# Patient Record
Sex: Male | Born: 1960 | Race: White | Hispanic: No | Smoking: Never smoker
Health system: Southern US, Community
[De-identification: ages and names within clinical notes are randomized; demographics above are authoritative.]

## PROBLEM LIST (undated history)

## (undated) DIAGNOSIS — K5790 Diverticulosis of intestine, part unspecified, without perforation or abscess without bleeding: Secondary | ICD-10-CM

## (undated) DIAGNOSIS — I10 Essential (primary) hypertension: Secondary | ICD-10-CM

## (undated) HISTORY — DX: Diverticulosis of intestine, part unspecified, without perforation or abscess without bleeding: K57.90

---

## 2020-08-10 ENCOUNTER — Other Ambulatory Visit: Payer: Self-pay | Admitting: Internal Medicine

## 2020-08-10 DIAGNOSIS — R1032 Left lower quadrant pain: Secondary | ICD-10-CM

## 2020-08-12 ENCOUNTER — Emergency Department (HOSPITAL_COMMUNITY): Payer: Managed Care, Other (non HMO)

## 2020-08-12 ENCOUNTER — Inpatient Hospital Stay: Admission: RE | Admit: 2020-08-12 | Payer: Self-pay | Source: Ambulatory Visit

## 2020-08-12 ENCOUNTER — Inpatient Hospital Stay (HOSPITAL_COMMUNITY)
Admission: EM | Admit: 2020-08-12 | Discharge: 2020-09-01 | DRG: 853 | Disposition: A | Discharge status: Rehabilitation Facility | Payer: Managed Care, Other (non HMO) | Attending: General Surgery | Admitting: General Surgery

## 2020-08-12 ENCOUNTER — Inpatient Hospital Stay (HOSPITAL_COMMUNITY): Payer: Managed Care, Other (non HMO)

## 2020-08-12 ENCOUNTER — Encounter (HOSPITAL_COMMUNITY): Payer: Self-pay

## 2020-08-12 ENCOUNTER — Encounter (HOSPITAL_COMMUNITY): Admission: EM | Disposition: A | Discharge status: Rehabilitation Facility | Payer: Self-pay | Source: Home / Self Care

## 2020-08-12 ENCOUNTER — Emergency Department (HOSPITAL_COMMUNITY): Payer: Managed Care, Other (non HMO) | Admitting: Certified Registered Nurse Anesthetist

## 2020-08-12 ENCOUNTER — Inpatient Hospital Stay: Admit: 2020-08-12 | Payer: Managed Care, Other (non HMO) | Admitting: General Surgery

## 2020-08-12 ENCOUNTER — Other Ambulatory Visit: Payer: Self-pay

## 2020-08-12 DIAGNOSIS — I82611 Acute embolism and thrombosis of superficial veins of right upper extremity: Secondary | ICD-10-CM | POA: Diagnosis not present

## 2020-08-12 DIAGNOSIS — Z7982 Long term (current) use of aspirin: Secondary | ICD-10-CM | POA: Diagnosis not present

## 2020-08-12 DIAGNOSIS — K567 Ileus, unspecified: Secondary | ICD-10-CM | POA: Diagnosis not present

## 2020-08-12 DIAGNOSIS — A419 Sepsis, unspecified organism: Principal | ICD-10-CM | POA: Diagnosis present

## 2020-08-12 DIAGNOSIS — E871 Hypo-osmolality and hyponatremia: Secondary | ICD-10-CM | POA: Diagnosis present

## 2020-08-12 DIAGNOSIS — I82A12 Acute embolism and thrombosis of left axillary vein: Secondary | ICD-10-CM | POA: Diagnosis not present

## 2020-08-12 DIAGNOSIS — Z4659 Encounter for fitting and adjustment of other gastrointestinal appliance and device: Secondary | ICD-10-CM

## 2020-08-12 DIAGNOSIS — K632 Fistula of intestine: Secondary | ICD-10-CM | POA: Diagnosis present

## 2020-08-12 DIAGNOSIS — R6520 Severe sepsis without septic shock: Secondary | ICD-10-CM | POA: Diagnosis not present

## 2020-08-12 DIAGNOSIS — M1711 Unilateral primary osteoarthritis, right knee: Secondary | ICD-10-CM | POA: Diagnosis present

## 2020-08-12 DIAGNOSIS — M726 Necrotizing fasciitis: Secondary | ICD-10-CM | POA: Diagnosis present

## 2020-08-12 DIAGNOSIS — K631 Perforation of intestine (nontraumatic): Secondary | ICD-10-CM | POA: Diagnosis present

## 2020-08-12 DIAGNOSIS — Z20822 Contact with and (suspected) exposure to covid-19: Secondary | ICD-10-CM | POA: Diagnosis present

## 2020-08-12 DIAGNOSIS — G9341 Metabolic encephalopathy: Secondary | ICD-10-CM | POA: Diagnosis present

## 2020-08-12 DIAGNOSIS — K660 Peritoneal adhesions (postprocedural) (postinfection): Secondary | ICD-10-CM | POA: Diagnosis present

## 2020-08-12 DIAGNOSIS — R1030 Lower abdominal pain, unspecified: Secondary | ICD-10-CM | POA: Diagnosis present

## 2020-08-12 DIAGNOSIS — I82461 Acute embolism and thrombosis of right calf muscular vein: Secondary | ICD-10-CM | POA: Diagnosis not present

## 2020-08-12 DIAGNOSIS — K9171 Accidental puncture and laceration of a digestive system organ or structure during a digestive system procedure: Secondary | ICD-10-CM | POA: Diagnosis not present

## 2020-08-12 DIAGNOSIS — M2550 Pain in unspecified joint: Secondary | ICD-10-CM | POA: Diagnosis not present

## 2020-08-12 DIAGNOSIS — Y733 Surgical instruments, materials and gastroenterology and urology devices (including sutures) associated with adverse incidents: Secondary | ICD-10-CM | POA: Diagnosis not present

## 2020-08-12 DIAGNOSIS — D62 Acute posthemorrhagic anemia: Secondary | ICD-10-CM | POA: Diagnosis not present

## 2020-08-12 DIAGNOSIS — K5720 Diverticulitis of large intestine with perforation and abscess without bleeding: Secondary | ICD-10-CM | POA: Diagnosis present

## 2020-08-12 DIAGNOSIS — R6521 Severe sepsis with septic shock: Secondary | ICD-10-CM | POA: Diagnosis present

## 2020-08-12 DIAGNOSIS — R609 Edema, unspecified: Secondary | ICD-10-CM | POA: Diagnosis not present

## 2020-08-12 DIAGNOSIS — M109 Gout, unspecified: Secondary | ICD-10-CM | POA: Diagnosis present

## 2020-08-12 DIAGNOSIS — M79604 Pain in right leg: Secondary | ICD-10-CM | POA: Diagnosis not present

## 2020-08-12 DIAGNOSIS — L89326 Pressure-induced deep tissue damage of left buttock: Secondary | ICD-10-CM | POA: Diagnosis not present

## 2020-08-12 DIAGNOSIS — M19021 Primary osteoarthritis, right elbow: Secondary | ICD-10-CM | POA: Diagnosis present

## 2020-08-12 DIAGNOSIS — M10061 Idiopathic gout, right knee: Secondary | ICD-10-CM | POA: Diagnosis not present

## 2020-08-12 DIAGNOSIS — E440 Moderate protein-calorie malnutrition: Secondary | ICD-10-CM | POA: Diagnosis not present

## 2020-08-12 DIAGNOSIS — L7682 Other postprocedural complications of skin and subcutaneous tissue: Secondary | ICD-10-CM | POA: Diagnosis not present

## 2020-08-12 DIAGNOSIS — M19022 Primary osteoarthritis, left elbow: Secondary | ICD-10-CM | POA: Diagnosis present

## 2020-08-12 DIAGNOSIS — E46 Unspecified protein-calorie malnutrition: Secondary | ICD-10-CM | POA: Diagnosis present

## 2020-08-12 DIAGNOSIS — E119 Type 2 diabetes mellitus without complications: Secondary | ICD-10-CM | POA: Diagnosis not present

## 2020-08-12 DIAGNOSIS — R509 Fever, unspecified: Secondary | ICD-10-CM

## 2020-08-12 DIAGNOSIS — E875 Hyperkalemia: Secondary | ICD-10-CM | POA: Diagnosis not present

## 2020-08-12 DIAGNOSIS — Y658 Other specified misadventures during surgical and medical care: Secondary | ICD-10-CM | POA: Diagnosis not present

## 2020-08-12 DIAGNOSIS — K658 Other peritonitis: Secondary | ICD-10-CM | POA: Diagnosis present

## 2020-08-12 DIAGNOSIS — L8990 Pressure ulcer of unspecified site, unspecified stage: Secondary | ICD-10-CM | POA: Insufficient documentation

## 2020-08-12 DIAGNOSIS — R5381 Other malaise: Secondary | ICD-10-CM | POA: Diagnosis not present

## 2020-08-12 DIAGNOSIS — N179 Acute kidney failure, unspecified: Secondary | ICD-10-CM | POA: Diagnosis present

## 2020-08-12 DIAGNOSIS — R739 Hyperglycemia, unspecified: Secondary | ICD-10-CM | POA: Diagnosis not present

## 2020-08-12 HISTORY — PX: PARTIAL COLECTOMY: SHX5273

## 2020-08-12 LAB — URINALYSIS, ROUTINE W REFLEX MICROSCOPIC
Bilirubin Urine: NEGATIVE
Glucose, UA: NEGATIVE mg/dL
Ketones, ur: NEGATIVE mg/dL
Nitrite: NEGATIVE
Protein, ur: 30 mg/dL — AB
RBC / HPF: >50 RBC/hpf — ABNORMAL HIGH (ref 0–5)
Specific Gravity, Urine: 1.026 (ref 1.005–1.030)
WBC, UA: >50 WBC/hpf — ABNORMAL HIGH (ref 0–5)
pH: 6 (ref 5.0–8.0)

## 2020-08-12 LAB — COMPREHENSIVE METABOLIC PANEL
ALT: 25 U/L (ref 0–44)
ALT: 21 U/L (ref 0–44)
AST: 21 U/L (ref 15–41)
AST: 18 U/L (ref 15–41)
Albumin: 2.4 g/dL — ABNORMAL LOW (ref 3.5–5.0)
Albumin: 1.8 g/dL — ABNORMAL LOW (ref 3.5–5.0)
Alkaline Phosphatase: 118 U/L (ref 38–126)
Alkaline Phosphatase: 101 U/L (ref 38–126)
Anion gap: 14 (ref 5–15)
Anion gap: 11 (ref 5–15)
BUN: 34 mg/dL — ABNORMAL HIGH (ref 6–20)
BUN: 31 mg/dL — ABNORMAL HIGH (ref 6–20)
CO2: 20 mmol/L — ABNORMAL LOW (ref 22–32)
CO2: 22 mmol/L (ref 22–32)
Calcium: 8.5 mg/dL — ABNORMAL LOW (ref 8.9–10.3)
Calcium: 8.1 mg/dL — ABNORMAL LOW (ref 8.9–10.3)
Chloride: 95 mmol/L — ABNORMAL LOW (ref 98–111)
Chloride: 98 mmol/L (ref 98–111)
Creatinine, Ser: 1.45 mg/dL — ABNORMAL HIGH (ref 0.61–1.24)
Creatinine, Ser: 1.24 mg/dL (ref 0.61–1.24)
GFR, Estimated: 55 mL/min — ABNORMAL LOW (ref >60)
GFR, Estimated: >60 mL/min (ref >60)
Glucose, Bld: 138 mg/dL — ABNORMAL HIGH (ref 70–99)
Glucose, Bld: 145 mg/dL — ABNORMAL HIGH (ref 70–99)
Potassium: 4.8 mmol/L (ref 3.5–5.1)
Potassium: 5.3 mmol/L — ABNORMAL HIGH (ref 3.5–5.1)
Sodium: 129 mmol/L — ABNORMAL LOW (ref 135–145)
Sodium: 131 mmol/L — ABNORMAL LOW (ref 135–145)
Total Bilirubin: 1.4 mg/dL — ABNORMAL HIGH (ref 0.3–1.2)
Total Bilirubin: 1.2 mg/dL (ref 0.3–1.2)
Total Protein: 6.1 g/dL — ABNORMAL LOW (ref 6.5–8.1)
Total Protein: 5 g/dL — ABNORMAL LOW (ref 6.5–8.1)

## 2020-08-12 LAB — CBC
HCT: 29.1 % — ABNORMAL LOW (ref 39.0–52.0)
Hemoglobin: 9.6 g/dL — ABNORMAL LOW (ref 13.0–17.0)
MCH: 31.2 pg (ref 26.0–34.0)
MCHC: 33 g/dL (ref 30.0–36.0)
MCV: 94.5 fL (ref 80.0–100.0)
Platelets: 499 10*3/uL — ABNORMAL HIGH (ref 150–400)
RBC: 3.08 MIL/uL — ABNORMAL LOW (ref 4.22–5.81)
RDW: 13.7 % (ref 11.5–15.5)
WBC: 38.8 10*3/uL — ABNORMAL HIGH (ref 4.0–10.5)
nRBC: 0 % (ref 0.0–0.2)

## 2020-08-12 LAB — SURGICAL PCR SCREEN
MRSA, PCR: NEGATIVE
Staphylococcus aureus: NEGATIVE

## 2020-08-12 LAB — RESP PANEL BY RT-PCR (FLU A&B, COVID) ARPGX2
Influenza A by PCR: NEGATIVE
Influenza B by PCR: NEGATIVE
SARS Coronavirus 2 by RT PCR: NEGATIVE

## 2020-08-12 LAB — LIPASE, BLOOD: Lipase: 18 U/L (ref 11–51)

## 2020-08-12 LAB — CBC WITH DIFFERENTIAL/PLATELET
Abs Immature Granulocytes: 0.77 10*3/uL — ABNORMAL HIGH (ref 0.00–0.07)
Basophils Absolute: 0.1 10*3/uL (ref 0.0–0.1)
Basophils Relative: 0 %
Eosinophils Absolute: 0 10*3/uL (ref 0.0–0.5)
Eosinophils Relative: 0 %
HCT: 29.2 % — ABNORMAL LOW (ref 39.0–52.0)
Hemoglobin: 9.8 g/dL — ABNORMAL LOW (ref 13.0–17.0)
Immature Granulocytes: 2 %
Lymphocytes Relative: 1 %
Lymphs Abs: 0.5 10*3/uL — ABNORMAL LOW (ref 0.7–4.0)
MCH: 31 pg (ref 26.0–34.0)
MCHC: 33.6 g/dL (ref 30.0–36.0)
MCV: 92.4 fL (ref 80.0–100.0)
Monocytes Absolute: 1.2 10*3/uL — ABNORMAL HIGH (ref 0.1–1.0)
Monocytes Relative: 3 %
Neutro Abs: 33.5 10*3/uL — ABNORMAL HIGH (ref 1.7–7.7)
Neutrophils Relative %: 94 %
Platelets: 477 10*3/uL — ABNORMAL HIGH (ref 150–400)
RBC: 3.16 MIL/uL — ABNORMAL LOW (ref 4.22–5.81)
RDW: 13.4 % (ref 11.5–15.5)
WBC: 36 10*3/uL — ABNORMAL HIGH (ref 4.0–10.5)
nRBC: 0 % (ref 0.0–0.2)

## 2020-08-12 LAB — POCT I-STAT 7, (LYTES, BLD GAS, ICA,H+H)
Acid-base deficit: 4 mmol/L — ABNORMAL HIGH (ref 0.0–2.0)
Bicarbonate: 22.2 mmol/L (ref 20.0–28.0)
Calcium, Ion: 1.19 mmol/L (ref 1.15–1.40)
HCT: 24 % — ABNORMAL LOW (ref 39.0–52.0)
Hemoglobin: 8.2 g/dL — ABNORMAL LOW (ref 13.0–17.0)
O2 Saturation: 99 %
Potassium: 4.7 mmol/L (ref 3.5–5.1)
Sodium: 128 mmol/L — ABNORMAL LOW (ref 135–145)
TCO2: 23 mmol/L (ref 22–32)
pCO2 arterial: 41.9 mmHg (ref 32.0–48.0)
pH, Arterial: 7.331 — ABNORMAL LOW (ref 7.350–7.450)
pO2, Arterial: 147 mmHg — ABNORMAL HIGH (ref 83.0–108.0)

## 2020-08-12 LAB — ABO/RH: ABO/RH(D): O POS

## 2020-08-12 LAB — GLUCOSE, CAPILLARY
Glucose-Capillary: 136 mg/dL — ABNORMAL HIGH (ref 70–99)
Glucose-Capillary: 172 mg/dL — ABNORMAL HIGH (ref 70–99)

## 2020-08-12 LAB — LACTIC ACID, PLASMA
Lactic Acid, Venous: 2.6 mmol/L (ref 0.5–1.9)
Lactic Acid, Venous: 2.1 mmol/L (ref 0.5–1.9)

## 2020-08-12 LAB — APTT: aPTT: 21 seconds — ABNORMAL LOW (ref 24–36)

## 2020-08-12 LAB — PROTIME-INR
INR: 1.2 (ref 0.8–1.2)
Prothrombin Time: 15.1 seconds (ref 11.4–15.2)

## 2020-08-12 SURGERY — COLECTOMY, PARTIAL
Anesthesia: General

## 2020-08-12 MED ORDER — LACTATED RINGERS IV BOLUS
500.0000 mL | Freq: Once | INTRAVENOUS | Status: AC
  Administered 2020-08-12: 500 mL via INTRAVENOUS

## 2020-08-12 MED ORDER — CLINDAMYCIN PHOSPHATE 600 MG/50ML IV SOLN
600.0000 mg | Freq: Three times a day (TID) | INTRAVENOUS | Status: DC
  Administered 2020-08-12 – 2020-08-17 (×12): 600 mg via INTRAVENOUS
  Filled 2020-08-12 (×13): qty 50

## 2020-08-12 MED ORDER — ENOXAPARIN SODIUM 40 MG/0.4ML IJ SOSY: 40.0000 mg | PREFILLED_SYRINGE | INTRAMUSCULAR | Status: DC

## 2020-08-12 MED ORDER — OXYCODONE HCL 5 MG/5ML PO SOLN: 5.0000 mg | Freq: Once | ORAL | Status: DC | PRN

## 2020-08-12 MED ORDER — VANCOMYCIN HCL 1250 MG/250ML IV SOLN
1250.0000 mg | INTRAVENOUS | Status: DC
  Administered 2020-08-13: 1250 mg via INTRAVENOUS
  Filled 2020-08-12: qty 250

## 2020-08-12 MED ORDER — NOREPINEPHRINE 4 MG/250ML-% IV SOLN
2.0000 ug/min | INTRAVENOUS | Status: DC
  Administered 2020-08-12: 2 ug/min via INTRAVENOUS
  Filled 2020-08-12: qty 250

## 2020-08-12 MED ORDER — ACETAMINOPHEN 10 MG/ML IV SOLN
1000.0000 mg | Freq: Four times a day (QID) | INTRAVENOUS | Status: AC
  Administered 2020-08-12 – 2020-08-13 (×4): 1000 mg via INTRAVENOUS
  Filled 2020-08-12 (×4): qty 100

## 2020-08-12 MED ORDER — ORAL CARE MOUTH RINSE
15.0000 mL | Freq: Two times a day (BID) | OROMUCOSAL | Status: DC
  Administered 2020-08-12 – 2020-09-01 (×32): 15 mL via OROMUCOSAL

## 2020-08-12 MED ORDER — LIDOCAINE 2% (20 MG/ML) 5 ML SYRINGE
INTRAMUSCULAR | Status: AC
  Filled 2020-08-12: qty 5

## 2020-08-12 MED ORDER — METRONIDAZOLE 500 MG/100ML IV SOLN
500.0000 mg | Freq: Once | INTRAVENOUS | Status: AC
  Administered 2020-08-12: 500 mg via INTRAVENOUS
  Filled 2020-08-12: qty 100

## 2020-08-12 MED ORDER — MUPIROCIN 2 % EX OINT
1.0000 "application " | TOPICAL_OINTMENT | Freq: Two times a day (BID) | CUTANEOUS | Status: DC
  Filled 2020-08-12: qty 22

## 2020-08-12 MED ORDER — PIPERACILLIN-TAZOBACTAM 3.375 G IVPB
3.3750 g | Freq: Three times a day (TID) | INTRAVENOUS | Status: AC
  Administered 2020-08-12 – 2020-08-26 (×41): 3.375 g via INTRAVENOUS
  Filled 2020-08-12 (×39): qty 50

## 2020-08-12 MED ORDER — LACTATED RINGERS IV SOLN: INTRAVENOUS | Status: DC

## 2020-08-12 MED ORDER — ONDANSETRON HCL 4 MG/2ML IJ SOLN: 4.0000 mg | Freq: Four times a day (QID) | INTRAMUSCULAR | Status: DC | PRN

## 2020-08-12 MED ORDER — SODIUM CHLORIDE 0.9 % IV BOLUS
1000.0000 mL | Freq: Once | INTRAVENOUS | Status: AC
  Administered 2020-08-12: 1000 mL via INTRAVENOUS

## 2020-08-12 MED ORDER — ONDANSETRON 4 MG PO TBDP
4.0000 mg | ORAL_TABLET | Freq: Four times a day (QID) | ORAL | Status: DC | PRN
Start: 2020-08-12 — End: 2020-09-01

## 2020-08-12 MED ORDER — SODIUM CHLORIDE 0.9 % IV SOLN
250.0000 mL | INTRAVENOUS | Status: DC
  Administered 2020-08-12: 250 mL via INTRAVENOUS

## 2020-08-12 MED ORDER — MIDAZOLAM HCL 2 MG/2ML IJ SOLN
INTRAMUSCULAR | Status: AC
  Filled 2020-08-12: qty 2

## 2020-08-12 MED ORDER — PHENYLEPHRINE 40 MCG/ML (10ML) SYRINGE FOR IV PUSH (FOR BLOOD PRESSURE SUPPORT)
PREFILLED_SYRINGE | INTRAVENOUS | Status: AC
  Filled 2020-08-12: qty 40

## 2020-08-12 MED ORDER — MIDAZOLAM HCL 2 MG/2ML IJ SOLN
INTRAMUSCULAR | Status: DC | PRN
  Administered 2020-08-12: 2 mg via INTRAVENOUS

## 2020-08-12 MED ORDER — NALOXONE HCL 0.4 MG/ML IJ SOLN
0.4000 mg | INTRAMUSCULAR | Status: DC | PRN
Start: 2020-08-12 — End: 2020-09-01

## 2020-08-12 MED ORDER — MEPERIDINE HCL 50 MG/ML IJ SOLN: 6.2500 mg | INTRAMUSCULAR | Status: DC | PRN

## 2020-08-12 MED ORDER — ROCURONIUM BROMIDE 10 MG/ML (PF) SYRINGE
PREFILLED_SYRINGE | INTRAVENOUS | Status: AC
  Filled 2020-08-12: qty 10

## 2020-08-12 MED ORDER — PHENYLEPHRINE HCL-NACL 10-0.9 MG/250ML-% IV SOLN
INTRAVENOUS | Status: DC | PRN
  Administered 2020-08-12: 20 ug/min via INTRAVENOUS

## 2020-08-12 MED ORDER — DIPHENHYDRAMINE HCL 50 MG/ML IJ SOLN: 12.5000 mg | Freq: Four times a day (QID) | INTRAMUSCULAR | Status: DC | PRN

## 2020-08-12 MED ORDER — FENTANYL CITRATE (PF) 100 MCG/2ML IJ SOLN
25.0000 ug | INTRAMUSCULAR | Status: DC | PRN
  Administered 2020-08-13 – 2020-08-20 (×7): 25 ug via INTRAVENOUS
  Filled 2020-08-12 (×7): qty 2

## 2020-08-12 MED ORDER — ONDANSETRON HCL 4 MG/2ML IJ SOLN
4.0000 mg | Freq: Four times a day (QID) | INTRAMUSCULAR | Status: DC | PRN
  Filled 2020-08-12: qty 2

## 2020-08-12 MED ORDER — HYDROMORPHONE 1 MG/ML IV SOLN
INTRAVENOUS | Status: DC
Start: 2020-08-12 — End: 2020-08-20
  Administered 2020-08-12 – 2020-08-14 (×2): 30 mg via INTRAVENOUS
  Administered 2020-08-14: 1.5 mg via INTRAVENOUS
  Administered 2020-08-15: 1.8 mg via INTRAVENOUS
  Administered 2020-08-15: 2.4 mg via INTRAVENOUS
  Administered 2020-08-16: 30 mg via INTRAVENOUS
  Administered 2020-08-16: 0.9 mL via INTRAVENOUS
  Administered 2020-08-17: 0.6 mL via INTRAVENOUS
  Administered 2020-08-17 – 2020-08-18 (×3): 30 mg via INTRAVENOUS
  Filled 2020-08-12 (×4): qty 30

## 2020-08-12 MED ORDER — VANCOMYCIN HCL IN DEXTROSE 1-5 GM/200ML-% IV SOLN: 1000.0000 mg | Freq: Once | INTRAVENOUS | Status: DC

## 2020-08-12 MED ORDER — IOHEXOL 350 MG/ML SOLN
100.0000 mL | Freq: Once | INTRAVENOUS | Status: AC | PRN
  Administered 2020-08-12: 80 mL via INTRAVENOUS

## 2020-08-12 MED ORDER — CHLORHEXIDINE GLUCONATE CLOTH 2 % EX PADS
6.0000 | MEDICATED_PAD | Freq: Every day | CUTANEOUS | Status: DC
  Administered 2020-08-12 – 2020-09-01 (×21): 6 via TOPICAL

## 2020-08-12 MED ORDER — SODIUM CHLORIDE 0.9 % IV SOLN: INTRAVENOUS | Status: DC

## 2020-08-12 MED ORDER — DIPHENHYDRAMINE HCL 12.5 MG/5ML PO ELIX: 12.5000 mg | ORAL_SOLUTION | Freq: Four times a day (QID) | ORAL | Status: DC | PRN

## 2020-08-12 MED ORDER — MIDAZOLAM HCL 2 MG/2ML IJ SOLN: 0.5000 mg | Freq: Once | INTRAMUSCULAR | Status: DC | PRN

## 2020-08-12 MED ORDER — OXYCODONE HCL 5 MG PO TABS: 5.0000 mg | ORAL_TABLET | Freq: Once | ORAL | Status: DC | PRN

## 2020-08-12 MED ORDER — ROCURONIUM BROMIDE 10 MG/ML (PF) SYRINGE
PREFILLED_SYRINGE | INTRAVENOUS | Status: DC | PRN
  Administered 2020-08-12: 60 mg via INTRAVENOUS
  Administered 2020-08-12: 40 mg via INTRAVENOUS

## 2020-08-12 MED ORDER — VANCOMYCIN HCL 1500 MG/300ML IV SOLN
1500.0000 mg | Freq: Once | INTRAVENOUS | Status: AC
  Administered 2020-08-12: 1500 mg via INTRAVENOUS
  Filled 2020-08-12: qty 300

## 2020-08-12 MED ORDER — PROPOFOL 10 MG/ML IV BOLUS
INTRAVENOUS | Status: AC
  Filled 2020-08-12: qty 20

## 2020-08-12 MED ORDER — LACTATED RINGERS IV BOLUS (SEPSIS)
1000.0000 mL | Freq: Once | INTRAVENOUS | Status: AC
  Administered 2020-08-12: 1000 mL via INTRAVENOUS

## 2020-08-12 MED ORDER — SODIUM CHLORIDE 0.9% FLUSH: 9.0000 mL | INTRAVENOUS | Status: DC | PRN

## 2020-08-12 MED ORDER — ALBUMIN HUMAN 5 % IV SOLN
25.0000 g | Freq: Once | INTRAVENOUS | Status: AC
  Administered 2020-08-12: 25 g via INTRAVENOUS
  Filled 2020-08-12: qty 500

## 2020-08-12 MED ORDER — PHENYLEPHRINE HCL (PRESSORS) 10 MG/ML IV SOLN
INTRAVENOUS | Status: AC
  Filled 2020-08-12: qty 1

## 2020-08-12 MED ORDER — ORAL CARE MOUTH RINSE: 15.0000 mL | Freq: Once | OROMUCOSAL | Status: AC

## 2020-08-12 MED ORDER — METHOCARBAMOL 1000 MG/10ML IJ SOLN
1000.0000 mg | Freq: Three times a day (TID) | INTRAVENOUS | Status: DC
  Administered 2020-08-13 – 2020-08-20 (×21): 1000 mg via INTRAVENOUS
  Filled 2020-08-12 (×2): qty 1000
  Filled 2020-08-12: qty 10
  Filled 2020-08-12 (×4): qty 1000
  Filled 2020-08-12: qty 10
  Filled 2020-08-12 (×9): qty 1000
  Filled 2020-08-12: qty 10
  Filled 2020-08-12: qty 1000
  Filled 2020-08-12: qty 10
  Filled 2020-08-12: qty 1000
  Filled 2020-08-12: qty 10
  Filled 2020-08-12 (×2): qty 1000

## 2020-08-12 MED ORDER — PROPOFOL 10 MG/ML IV BOLUS
INTRAVENOUS | Status: DC | PRN
  Administered 2020-08-12: 150 mg via INTRAVENOUS

## 2020-08-12 MED ORDER — FENTANYL CITRATE (PF) 250 MCG/5ML IJ SOLN
INTRAMUSCULAR | Status: DC | PRN
  Administered 2020-08-12 (×3): 50 ug via INTRAVENOUS
  Administered 2020-08-12: 100 ug via INTRAVENOUS

## 2020-08-12 MED ORDER — FENTANYL CITRATE (PF) 250 MCG/5ML IJ SOLN
INTRAMUSCULAR | Status: AC
  Filled 2020-08-12: qty 5

## 2020-08-12 MED ORDER — HYDROMORPHONE HCL 1 MG/ML IJ SOLN
INTRAMUSCULAR | Status: AC
  Filled 2020-08-12: qty 1

## 2020-08-12 MED ORDER — LACTATED RINGERS IV BOLUS (SEPSIS)
250.0000 mL | Freq: Once | INTRAVENOUS | Status: AC
  Administered 2020-08-12: 250 mL via INTRAVENOUS

## 2020-08-12 MED ORDER — DEXAMETHASONE SODIUM PHOSPHATE 10 MG/ML IJ SOLN
INTRAMUSCULAR | Status: DC | PRN
  Administered 2020-08-12: 5 mg via INTRAVENOUS

## 2020-08-12 MED ORDER — SUGAMMADEX SODIUM 200 MG/2ML IV SOLN
INTRAVENOUS | Status: DC | PRN
  Administered 2020-08-12: 200 mg via INTRAVENOUS

## 2020-08-12 MED ORDER — METOPROLOL TARTRATE 5 MG/5ML IV SOLN: 5.0000 mg | Freq: Four times a day (QID) | INTRAVENOUS | Status: DC | PRN

## 2020-08-12 MED ORDER — SODIUM CHLORIDE (PF) 0.9 % IJ SOLN
INTRAMUSCULAR | Status: AC
  Filled 2020-08-12: qty 50

## 2020-08-12 MED ORDER — PIPERACILLIN-TAZOBACTAM 3.375 G IVPB: 3.3750 g | Freq: Three times a day (TID) | INTRAVENOUS | Status: DC

## 2020-08-12 MED ORDER — CHLORHEXIDINE GLUCONATE 0.12 % MT SOLN
15.0000 mL | Freq: Once | OROMUCOSAL | Status: AC
  Administered 2020-08-12: 15 mL via OROMUCOSAL

## 2020-08-12 MED ORDER — PHENYLEPHRINE 40 MCG/ML (10ML) SYRINGE FOR IV PUSH (FOR BLOOD PRESSURE SUPPORT)
PREFILLED_SYRINGE | INTRAVENOUS | Status: DC | PRN
  Administered 2020-08-12: 40 ug via INTRAVENOUS
  Administered 2020-08-12: 80 ug via INTRAVENOUS
  Administered 2020-08-12 (×2): 40 ug via INTRAVENOUS

## 2020-08-12 MED ORDER — HYDROMORPHONE HCL 1 MG/ML IJ SOLN: 0.2500 mg | INTRAMUSCULAR | Status: DC | PRN

## 2020-08-12 MED ORDER — SODIUM CHLORIDE 0.9 % IV SOLN
2.0000 g | Freq: Once | INTRAVENOUS | Status: AC
  Administered 2020-08-12: 2 g via INTRAVENOUS
  Filled 2020-08-12: qty 2

## 2020-08-12 MED ORDER — ONDANSETRON HCL 4 MG/2ML IJ SOLN
INTRAMUSCULAR | Status: DC | PRN
  Administered 2020-08-12: 4 mg via INTRAVENOUS

## 2020-08-12 MED ORDER — PROMETHAZINE HCL 25 MG/ML IJ SOLN: 6.2500 mg | INTRAMUSCULAR | Status: DC | PRN

## 2020-08-12 MED ORDER — 0.9 % SODIUM CHLORIDE (POUR BTL) OPTIME
TOPICAL | Status: DC | PRN
  Administered 2020-08-12 (×3): 1000 mL

## 2020-08-12 MED ORDER — LIDOCAINE HCL (CARDIAC) PF 100 MG/5ML IV SOSY
PREFILLED_SYRINGE | INTRAVENOUS | Status: DC | PRN
  Administered 2020-08-12: 100 mg via INTRAVENOUS

## 2020-08-12 SURGICAL SUPPLY — 50 items
BAG COUNTER SPONGE SURGICOUNT (BAG) IMPLANT
BAG SPNG CNTER NS LX DISP (BAG)
BARRIER SKIN 2 1/4 (WOUND CARE) ×2 IMPLANT
BNDG GAUZE ELAST 4 BULKY (GAUZE/BANDAGES/DRESSINGS) ×2 IMPLANT
BRR SKN FLT 1.75X2.25 2 PC (WOUND CARE) ×1
DRSG OPSITE POSTOP 4X10 (GAUZE/BANDAGES/DRESSINGS) IMPLANT
DRSG OPSITE POSTOP 4X6 (GAUZE/BANDAGES/DRESSINGS) IMPLANT
DRSG OPSITE POSTOP 4X8 (GAUZE/BANDAGES/DRESSINGS) IMPLANT
DRSG PAD ABDOMINAL 8X10 ST (GAUZE/BANDAGES/DRESSINGS) ×2 IMPLANT
ELECT REM PT RETURN 15FT ADLT (MISCELLANEOUS) ×2 IMPLANT
GAUZE SPONGE 4X4 12PLY STRL (GAUZE/BANDAGES/DRESSINGS) ×2 IMPLANT
GLOVE SURG POLYISO LF SZ7 (GLOVE) ×4 IMPLANT
GLOVE SURG UNDER POLY LF SZ7 (GLOVE) ×4 IMPLANT
GOWN STRL REUS W/TWL LRG LVL3 (GOWN DISPOSABLE) ×4 IMPLANT
GOWN STRL REUS W/TWL XL LVL3 (GOWN DISPOSABLE) IMPLANT
HEMOSTAT SNOW SURGICEL 2X4 (HEMOSTASIS) ×2 IMPLANT
HOLDER FOLEY CATH W/STRAP (MISCELLANEOUS) IMPLANT
KIT TURNOVER KIT A (KITS) ×2 IMPLANT
LIGASURE IMPACT 36 18CM CVD LR (INSTRUMENTS) ×2 IMPLANT
MANIFOLD NEPTUNE II (INSTRUMENTS) ×2 IMPLANT
PACK COLON (CUSTOM PROCEDURE TRAY) ×2 IMPLANT
PENCIL SMOKE EVACUATOR (MISCELLANEOUS) IMPLANT
POUCH OSTOMY 2 PC DRNBL 2.25 (WOUND CARE) ×1 IMPLANT
POUCH OSTOMY DRNBL 2 1/4 (WOUND CARE) ×2
RELOAD PROXIMATE 75MM BLUE (ENDOMECHANICALS) IMPLANT
SPONGE T-LAP 18X18 ~~LOC~~+RFID (SPONGE) ×6 IMPLANT
STAPLER CUT RELOAD BLUE (STAPLE) ×4 IMPLANT
STAPLER GUN LINEAR PROX 60 (STAPLE) IMPLANT
STAPLER PROXIMATE 75MM BLUE (STAPLE) IMPLANT
SUT NOVA NAB DX-16 0-1 5-0 T12 (SUTURE) IMPLANT
SUT NOVA NAB GS-21 0 18 T12 DT (SUTURE) ×4 IMPLANT
SUT PDS AB 0 CT 36 (SUTURE) ×2 IMPLANT
SUT PDS AB 0 CTX 36 PDP370T (SUTURE) ×4 IMPLANT
SUT PROLENE 2 0 BLUE (SUTURE) IMPLANT
SUT PROLENE 2 0 SH DA (SUTURE) ×2 IMPLANT
SUT SILK 2 0 (SUTURE) ×2
SUT SILK 2 0 SH CR/8 (SUTURE) ×2 IMPLANT
SUT SILK 2-0 18XBRD TIE 12 (SUTURE) ×1 IMPLANT
SUT SILK 3 0 (SUTURE) ×2
SUT SILK 3 0 SH CR/8 (SUTURE) ×2 IMPLANT
SUT SILK 3-0 18XBRD TIE 12 (SUTURE) ×1 IMPLANT
SUT VIC AB 2-0 SH 18 (SUTURE) IMPLANT
SUT VIC AB 2-0 SH 27 (SUTURE) ×4
SUT VIC AB 2-0 SH 27X BRD (SUTURE) ×2 IMPLANT
SUT VIC AB 3-0 SH 8-18 (SUTURE) ×2 IMPLANT
SUT VIC AB 4-0 PS2 18 (SUTURE) ×2 IMPLANT
TAPE CLOTH SURG 4X10 WHT LF (GAUZE/BANDAGES/DRESSINGS) ×2 IMPLANT
TOWEL OR 17X26 10 PK STRL BLUE (TOWEL DISPOSABLE) IMPLANT
TOWEL OR NON WOVEN STRL DISP B (DISPOSABLE) ×4 IMPLANT
TUBING CONNECTING 10 (TUBING) ×4 IMPLANT

## 2020-08-12 NOTE — ED Notes (Signed)
CRITICAL LAB VALUE: LACTIC ACID 2.6 REPORTED TO DR Rogene Houston AND ATTENDING PA

## 2020-08-12 NOTE — H&P (Signed)
Admission Note  Roger Brennan [birth year]  [id number].    Requesting MD: Charmaine Downs PA-C Chief Complaint/Reason for Consult: colon perforation   HPI:  Patient is an otherwise healthy 60 year old male who presented to [facility] today at the direction of PCP with LLQ abdominal pain and induration for the last 2-3 weeks. This started as a small area, about the size of a silver dollar, and has progressively expanded and worsened. He reports pain after physical activity. Reports abdominal wall was not erythematous until today. He does report decreased appetite and intermittent fevers over the last several weeks. Denies chest pain, SOB, urinary symptoms, nausea, vomiting. He is having bowel movements still and last BM was yesterday. He started having some issues with constipation in early June and he made diet adjustments with fiber supplementation for this. His PCP was seeing him for this and was planning a CT scan but the earliest he could get scheduled was 7/26 and PCP felt he needed to be evaluated sooner. His wife is at the bedside and also reports ~20 lbs weight loss over the last 6 weeks. Past abdominal surgery includes bilateral inguinal hernia repairs at age 85. He reports NKDA and takes no blood thinning medications. He denies tobacco use, heavy alcohol use or illicit drug use. 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 He has never had a colonoscopy.   ROS: Review of Systems  Constitutional:  Positive for fever and weight loss. Negative for chills.  Respiratory:  Negative for shortness of breath and wheezing.   Cardiovascular:  Negative for chest pain and palpitations.  Gastrointestinal:  Positive for abdominal pain and constipation. Negative for blood in stool, diarrhea, melena, nausea and vomiting.  Genitourinary:  Negative for dysuria, frequency and urgency.  All other systems reviewed and are negative.  History  reviewed. No pertinent family history.  History reviewed. No pertinent past medical history.  Social History:  has no history on file for tobacco use, alcohol use, and drug use.  Allergies: No Known Allergies  (Not in a hospital admission)   Blood pressure 120/79, pulse (!) 115, temperature 98.9 F (37.2 C), temperature source Oral, resp. rate 15, height 5\' 10"  (1.778 m), weight 73.9 kg, SpO2 100 %. Physical Exam:  General: pleasant, WD, WN male who is laying in bed in NAD HEENT: head is normocephalic, atraumatic.  Sclera are noninjected.  PERRL.  Ears and nose without any masses or lesions.  Mouth is pink and moist Heart: sinus tachycardia in the 110s.  Normal s1,s2. No obvious murmurs, gallops, or rubs noted.  Palpable radial and pedal pulses bilaterally Lungs: CTAB, no wheezes, rhonchi, or rales noted.  Respiratory effort nonlabored Abd: firm, abdominal wall erythematous, mild-moderate generalized ttp MS: all 4 extremities are symmetrical with no cyanosis, clubbing, or edema. Skin: warm and dry with no masses, lesions, or rashes Neuro: Cranial nerves 2-12 grossly intact, sensation is normal throughout Psych: A&Ox3 with an appropriate affect.   Results for orders placed or performed during the hospital encounter of 08/12/20 (from the past 48 hour(s))  CBC with Differential     Status: Abnormal   Collection Time: 08/12/20 10:10 AM  Result Value Ref Range   WBC 36.0 (H) 4.0 - 10.5 K/uL   RBC 3.16 (L) 4.22 - 5.81 MIL/uL   Hemoglobin 9.8 (L) 13.0 - 17.0 g/dL   HCT 29.2 (L) 39.0 - 52.0 %  MCV 92.4 80.0 - 100.0 fL   MCH 31.0 26.0 - 34.0 pg   MCHC 33.6 30.0 - 36.0 g/dL   RDW 13.4 11.5 - 15.5 %   Platelets 477 (H) 150 - 400 K/uL   nRBC 0.0 0.0 - 0.2 %   Neutrophils Relative % 94 %   Neutro Abs 33.5 (H) 1.7 - 7.7 K/uL   Lymphocytes Relative 1 %   Lymphs Abs 0.5 (L) 0.7 - 4.0 K/uL   Monocytes Relative 3 %   Monocytes Absolute 1.2 (H) 0.1 - 1.0 K/uL   Eosinophils Relative 0 %    Eosinophils Absolute 0.0 0.0 - 0.5 K/uL   Basophils Relative 0 %   Basophils Absolute 0.1 0.0 - 0.1 K/uL   Immature Granulocytes 2 %   Abs Immature Granulocytes 0.77 (H) 0.00 - 0.07 K/uL    Comment: Performed at [facility], [place] [address], [place], [place] [address]  Comprehensive metabolic panel     Status: Abnormal   Collection Time: 08/12/20 10:10 AM  Result Value Ref Range   Sodium 129 (L) 135 - 145 mmol/L   Potassium 4.8 3.5 - 5.1 mmol/L   Chloride 95 (L) 98 - 111 mmol/L   CO2 20 (L) 22 - 32 mmol/L   Glucose, Bld 138 (H) 70 - 99 mg/dL    Comment: Glucose reference range applies only to samples taken after fasting for at least 8 hours.   BUN 34 (H) 6 - 20 mg/dL   Creatinine, Ser 1.45 (H) 0.61 - 1.24 mg/dL   Calcium 8.5 (L) 8.9 - 10.3 mg/dL   Total Protein 6.1 (L) 6.5 - 8.1 g/dL   Albumin 2.4 (L) 3.5 - 5.0 g/dL   AST 21 15 - 41 U/L   ALT 25 0 - 44 U/L   Alkaline Phosphatase 118 38 - 126 U/L   Total Bilirubin 1.4 (H) 0.3 - 1.2 mg/dL   GFR, Estimated 55 (L) >60 mL/min    Comment: (NOTE) Calculated using the CKD-EPI Creatinine Equation (2021)    Anion gap 14 5 - 15    Comment: Performed at [facility], [place] [address] [place], [place] [address]  Lipase, blood     Status: None   Collection Time: 08/12/20 10:10 AM  Result Value Ref Range   Lipase 18 11 - 51 U/L    Comment: Performed at [facility], [place] [address] [place], [place] [address]  Lactic acid, plasma     Status: Abnormal   Collection Time: 08/12/20 10:10 AM  Result Value Ref Range   Lactic Acid, Venous 2.6 (HH) 0.5 - 1.9 mmol/L    Comment: CRITICAL RESULT CALLED TO, READ BACK BY AND VERIFIED WITH: Burna Forts RN @ 1116 ON 08/12/2020 BY Rolena Infante, E  Performed at [facility], [place] [address] [place] [address], [place], [place] [address]   Protime-INR     Status: None   Collection Time: 08/12/20 10:10 AM  Result Value Ref Range   Prothrombin Time 15.1 11.4 -  15.2 seconds   INR 1.2 0.8 - 1.2    Comment: (NOTE) INR goal varies based on device and disease states. Performed at [facility], [place] [address], [place], [place] [address]   APTT     Status: Abnormal   Collection Time: 08/12/20 10:10 AM  Result Value Ref Range   aPTT 21 (L) 24 - 36 seconds    Comment: Performed at [facility], [place]  [address] [place], [place] [address]  Resp Panel by RT-PCR (Flu A&B, Covid) Nasopharyngeal Swab     Status: None   Collection Time: 08/12/20 10:55 AM   Specimen: Nasopharyngeal Swab; Nasopharyngeal(NP) swabs in vial transport medium  Result Value Ref Range   SARS Coronavirus 2 by RT PCR NEGATIVE NEGATIVE    Comment: (NOTE) SARS-CoV-2 target nucleic acids are NOT DETECTED.  The SARS-CoV-2 RNA is generally detectable in upper respiratory specimens during the acute phase of infection. The lowest concentration of SARS-CoV-2 viral copies this assay can detect is 138 copies/mL. A negative result does not preclude SARS-Cov-2 infection and should not be used as the sole basis for treatment or other patient management decisions. A negative result may occur with  improper specimen collection/handling, submission of specimen other than nasopharyngeal swab, presence of viral mutation(s) within the areas targeted by this assay, and inadequate number of viral copies(<138 copies/mL). A negative result must be combined with clinical observations, patient history, and epidemiological information. The expected result is Negative.  Fact Sheet for Patients:  EntrepreneurPulse.com.au  Fact Sheet for Healthcare Providers:  IncredibleEmployment.be  This test is no t yet approved or cleared by the [place] FDA and  has been authorized for detection and/or diagnosis of SARS-CoV-2 by FDA under an Emergency Use Authorization (EUA). This EUA will remain  in effect (meaning this test can be  used) for the duration of the COVID-19 declaration under Section 564(b)(1) of the Act, 21 U.S.C.section 360bbb-3(b)(1), unless the authorization is terminated  or revoked sooner.       Influenza A by PCR NEGATIVE NEGATIVE   Influenza B by PCR NEGATIVE NEGATIVE    Comment: (NOTE) The Xpert Xpress SARS-CoV-2/FLU/RSV plus assay is intended as an aid in the diagnosis of influenza from Nasopharyngeal swab specimens and should not be used as a sole basis for treatment. Nasal washings and aspirates are unacceptable for Xpert Xpress SARS-CoV-2/FLU/RSV testing.  Fact Sheet for Patients: EntrepreneurPulse.com.au  Fact Sheet for Healthcare Providers: IncredibleEmployment.be  This test is not yet approved or cleared by the [place] FDA and has been authorized for detection and/or diagnosis of SARS-CoV-2 by FDA under an Emergency Use Authorization (EUA). This EUA will remain in effect (meaning this test can be used) for the duration of the COVID-19 declaration under Section 564(b)(1) of the Act, 21 U.S.C. section 360bbb-3(b)(1), unless the authorization is terminated or revoked.  Performed at [facility], [place] [address], [place], [place] [address]   Urinalysis, Routine w reflex microscopic Urine, Clean Catch     Status: Abnormal   Collection Time: 08/12/20 12:42 PM  Result Value Ref Range   Color, Urine YELLOW YELLOW   APPearance TURBID (A) CLEAR   Specific Gravity, Urine 1.026 1.005 - 1.030   pH 6.0 5.0 - 8.0   Glucose, UA NEGATIVE NEGATIVE mg/dL   Hgb urine dipstick MODERATE (A) NEGATIVE   Bilirubin Urine NEGATIVE NEGATIVE   Ketones, ur NEGATIVE NEGATIVE mg/dL   Protein, ur 30 (A) NEGATIVE mg/dL   Nitrite NEGATIVE NEGATIVE   Leukocytes,Ua LARGE (A) NEGATIVE   RBC / HPF >50 (H) 0 - 5 RBC/hpf   WBC, UA >50 (H) 0 - 5 WBC/hpf   Bacteria, UA MANY (A) NONE SEEN   WBC Clumps PRESENT    Mucus PRESENT     Comment: Performed  at [facility], [place] [address], [place], [place] [address]   CT ABDOMEN PELVIS W CONTRAST  Result Date: 08/12/2020 CLINICAL DATA:  Abdominal  pain, fever EXAM: CT ABDOMEN AND PELVIS WITH CONTRAST TECHNIQUE: Multidetector CT imaging of the abdomen and pelvis was performed using the standard protocol following bolus administration of intravenous contrast. CONTRAST:  88mL OMNIPAQUE IOHEXOL 350 MG/ML SOLN COMPARISON:  None. FINDINGS: Lower chest: Lung bases are clear. No effusions. Heart is normal size. Hepatobiliary: No focal hepatic abnormality. Gallbladder unremarkable. Pancreas: No focal abnormality or ductal dilatation. Spleen: No focal abnormality.  Normal size. Adrenals/Urinary Tract: No adrenal abnormality. No focal renal abnormality. No stones or hydronephrosis. Urinary bladder is unremarkable. Stomach/Bowel: There is sigmoid diverticulosis. Wall thickening noted within the sigmoid colon with surrounding inflammation and extraluminal gas most compatible with diverticulitis although perforated neoplasm cannot be completely excluded. Stomach and small bowel decompressed. Vascular/Lymphatic: No evidence of aneurysm or adenopathy. Reproductive: No visible focal abnormality. Other: Large complex gas and fluid collection noted throughout the left anterior abdominal wall and left lower abdominal wall musculature. This is concerning for possible fistulous communication to the intra-abdominal cavity related to inflammatory process in the left lower quadrant. Fluid and gas collection in the left lower abdominal wall measures 11.5 x 5.1 cm on image 71 of series 2. On coronal imaging, this extends over approximately 18 cm craniocaudal length. Musculoskeletal: No acute bony abnormality. IMPRESSION: Inflammatory process in the left lower quadrant consisting of sigmoid diverticulosis with wall thickening, surrounding inflammation and extraluminal gas as well as large fluid and gas collection extending  into the left abdominal wall musculature and subcutaneous soft tissues in the left lower abdomen and pelvis. Findings concerning for perforated diverticulitis with associated fistulous connection to the abdominal wall. Perforated neoplasm cannot be completely excluded. These results were called by telephone at the time of interpretation on 08/12/2020 at 11:54 am to provider [facility] , who verbally acknowledged these results. Electronically Signed   By: Rolm Baptise M.D.   On: 08/12/2020 11:58   DG Chest Port 1 View  Result Date: 08/12/2020 CLINICAL DATA:  Questionable sepsis.  Left abdominal pain. EXAM: PORTABLE CHEST 1 VIEW COMPARISON:  None. FINDINGS: Heart and mediastinal contours are within normal limits. No focal opacities or effusions. No acute bony abnormality. IMPRESSION: No active disease. Electronically Signed   By: Rolm Baptise M.D.   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

## 2020-08-12 NOTE — ED Notes (Signed)
Patient off the floor to short stay

## 2020-08-12 NOTE — Op Note (Signed)
Preoperative diagnosis: abdominal wall abscess, diverticulitis  Postoperative diagnosis: necrotizing soft tissue infection of the abdominal wall, perforated diverticulitis with colocutaneous fistula  Procedure:  1. drainage and debridement of left lower quadrant abdominal wall 15 cm by 20 cm,  2. partial colectomy with end colostomy 3. Splenic mobilization  Surgeon: Gurney Maxin, M.D.  Asst: Barkley Boards, PA-C  Anesthesia: general  Indications for procedure: Roger Brennan is a 60 y.o. year old male with symptoms of abdominal pain and swelling over the left lower quadrant. Imaging showed perforated colon with developing abscess in the abdominal wall. He was hypotensive, tachycardic, febrile, with leukocytosis consistent with septic shock.  Description of procedure: The patient was brought into the operative suite. Anesthesia was administered with General endotracheal anesthesia. WHO checklist was applied. The patient was then placed in supine position. The area was prepped and draped in the usual sterile fashion.  Next, a left transverse incision was made and a large amount gray purulent fluid was drained. The incision was enlarged and there was necrotic fat and some necrotic fascia in the area.  The area tracked across midline approximately 20 cm in the transverse direction approximately 15 cm in the vertical direction.  , Midline incision was made cautery was used dissect down through subtenons tissue the inferior portion did involve necrotic looking fascia.  The abdomen was entered.  --- Upon entering the abdomen (organ space), I encountered feculent peritonitis. ---  There are multiple adhesions of the small intestine to the sigmoid colon sigmoid colon is densely adhered to the abdominal wall anteriorly and laterally.  Distal rectum was dilated.  Sharp dissection was used to free the small intestine away from the sigmoid colon.  Dissection was used to free the colon away from the  lateral and anterior abdominal wall.  A blue load contour stapler was used to divide healthy portion of rectosigmoid junction.  A LigaSure was used to divide mesentery going proximally.  White line of Toldt was incised moving towards the spleen.  Spleen was very high.  Once a healthy portion of colon was seen was divided with a blue load contour stapler.  Due to this perforation being more of a sigmoid left colon then distal sigmoid additional mobilization was made of the left colon.  The entire splenic flexure was taken down with blunt dissection and cautery.  Next a circular incision was made in the left upper quadrant cautery was used to dissect down through subcutaneous tissues and a cruciate incision was made in the fascia and the colon was brought through this hole.  Next the abdomen was irrigated with large amount of warm saline.  The upper abdominal incision was then closed with 0 PDS in running fashion.    Next, lower abdominal wound was reinspected and sharp debridement was performed with curette.  Further necrotic fatty tissue was removed.  The subcutaneous wound was irrigated with warm saline.  The lower abdominal incision was closed with interrupted 0 Novafil's due to concern for compromise of the fascia due to infection.  Kerlix was used to pack the subcutaneous areas of the midline incision as well as the left abdominal wound.  The infection had created a tunnel between these 2 areas and the Kerlix was tunneled through this.  Finally, ostomy was matured using approximately 3 cm of Brooking using a 3-0 Vicryl in interrupted fashion.  Ostomy appliance was put in place.  Patient was then extubated and brought to PACU stable yet critically ill condition. All counts are correct.  Findings: soft tissue infection of abdominal wall, perforated sigmoid colon  Specimen: sigmoid colon  Implant: kerlex for wound, ostomy appliance   Blood loss: 200 ml  Local anesthesia: none  Complications:  none  Plan: patient to be admitted to the ICU, likely will return to operating room in next 24-48 h for evaluation of fascial health/further debridement  Gurney Maxin, M.D. General, Bariatric, & Minimally Invasive Surgery [facility]

## 2020-08-12 NOTE — Anesthesia Procedure Notes (Signed)
Procedure Name: Intubation Date/Time: 08/12/2020 3:01 PM Performed by: Raenette Rover, CRNA Pre-anesthesia Checklist: Patient identified, Emergency Drugs available, Suction available and Patient being monitored Patient Re-evaluated:Patient Re-evaluated prior to induction Oxygen Delivery Method: Circle system utilized Preoxygenation: Pre-oxygenation with 100% oxygen Induction Type: IV induction Ventilation: Mask ventilation without difficulty Laryngoscope Size: Mac and 4 Grade View: Grade I Tube type: Oral Tube size: 7.5 mm Number of attempts: 1 Airway Equipment and Method: Stylet Placement Confirmation: ETT inserted through vocal cords under direct vision, positive ETCO2 and breath sounds checked- equal and bilateral Secured at: 23 cm Tube secured with: Tape Dental Injury: Teeth and Oropharynx as per pre-operative assessment

## 2020-08-12 NOTE — Progress Notes (Signed)
[facility] Progress Note Patient Name: Reshawn Ostlund DOB: 07-08-1960 MRN: [id number]   Date of Service  08/12/2020  HPI/Events of Note  BP soft post-op.  eICU Interventions  LR 500 ml iv bolus + Albumin 5 % 500 ml iv bolus + peripheral protocol Levophed if MAP < 65 mmHg after fluid boluses.        Kerry Kass Nagee Goates 08/12/2020, 9:22 PM

## 2020-08-12 NOTE — ED Provider Notes (Signed)
[facility] DEPT Provider Note   CSN: [id number] Arrival date & time: 08/12/20  0901     History Chief Complaint  Patient presents with   Abdominal Pain    Roger Brennan is a 60 y.o. male with no significant past medical history who presents to the ED due to lower abdominal pain associated with edema x2 weeks. Patient notes he noticed a small area of edema in his LLQ which has gradually spread. Patient denies erythema. 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 He endorses a "low-grade" fever nightly around 100F. Last BM yesterday which was normal. Denies testicular pain or edema. No urinary symptoms. No penile symptoms. He has a history of bilateral hernia repair when he was 60 years old. No nausea, vomiting, or diarrhea. Per wife at bedside, patient has had a 20lb weight loss in 6 week. Weight loss unintentional. No previous colonoscopy. No fever or chills. He notes he was feeling unwell the beginning of June due to constipation; however he began to ate better which improved his symptoms. He has a schedule CT scan on 7/26 from his PCP to evaluated his abdominal pain. He has been taking tylenol and ibuprofen with moderate relief.   History obtained from patient and past medical records. No interpreter used during encounter.       History reviewed. No pertinent past medical history.  There are no problems to display for this patient.       History reviewed. No pertinent family history.  Social History   Tobacco Use   Smoking status: Never    Passive exposure: Never   Smokeless tobacco: Never  Vaping Use   Vaping Use: Never used  Substance Use Topics   Alcohol use: Never   Drug use: Never    Home Medications Prior to Admission medications   Medication Sig Start Date End Date Taking? Authorizing Provider  aspirin EC 81 MG tablet Take 81 mg  by mouth daily. Swallow whole.   Yes [provider]  traMADol (ULTRAM) 50 MG tablet Take 50 mg by mouth as needed (pain).   Yes [provider]    Allergies    Patient has no known allergies.  Review of Systems   Review of Systems  Constitutional:  Positive for appetite change, fever and unexpected weight change. Negative for chills.  Respiratory:  Negative for shortness of breath.   Cardiovascular:  Negative for chest pain.  Gastrointestinal:  Positive for abdominal pain. Negative for diarrhea, nausea and vomiting.  Genitourinary:  Negative for dysuria and testicular pain.  All other systems reviewed and are negative.  Physical Exam Updated Vital Signs BP 120/79   Pulse (!) 115   Temp 98.9 F (37.2 C) (Oral)   Resp 15   Ht 5\' 10"  (1.778 m)   Wt 73.9 kg   SpO2 100%   BMI 23.39 kg/m   Physical Exam Vitals and nursing note reviewed.  Constitutional:      General: He is not in acute distress.    Appearance: He is not ill-appearing.  HENT:     Head: Normocephalic.  Eyes:     Pupils: Pupils are equal, round, and reactive to light.  Cardiovascular:     Rate and Rhythm: Normal rate and regular rhythm.     Pulses: Normal pulses.     Heart sounds: Normal heart sounds. No murmur heard.   No  friction rub. No gallop.  Pulmonary:     Effort: Pulmonary effort is normal.     Breath sounds: Normal breath sounds.  Abdominal:     General: Abdomen is flat. There is no distension.     Palpations: Abdomen is soft.     Tenderness: There is abdominal tenderness. There is no guarding or rebound.     Comments: Lower abdomen with overlying erythema and induration. TTP in lower quadrants. No epigastric or upper abdominal tenderness  Genitourinary:    Comments: GU exam performed with chaperone in room.  No testicular tenderness or edema.  Erythema does not spread to inguinal region.  Musculoskeletal:        General: Normal range of motion.     Cervical back: Neck supple.   Skin:    General: Skin is warm and dry.  Neurological:     General: No focal deficit present.     Mental Status: He is alert.  Psychiatric:        Mood and Affect: Mood normal.        Behavior: Behavior normal.    ED Results / Procedures / Treatments   Labs (all labs ordered are listed, but only abnormal results are displayed) Labs Reviewed  CBC WITH DIFFERENTIAL/PLATELET - Abnormal; Notable for the following components:      Result Value   WBC 36.0 (*)    RBC 3.16 (*)    Hemoglobin 9.8 (*)    HCT 29.2 (*)    Platelets 477 (*)    Neutro Abs 33.5 (*)    Lymphs Abs 0.5 (*)    Monocytes Absolute 1.2 (*)    Abs Immature Granulocytes 0.77 (*)    All other components within normal limits  COMPREHENSIVE METABOLIC PANEL - Abnormal; Notable for the following components:   Sodium 129 (*)    Chloride 95 (*)    CO2 20 (*)    Glucose, Bld 138 (*)    BUN 34 (*)    Creatinine, Ser 1.45 (*)    Calcium 8.5 (*)    Total Protein 6.1 (*)    Albumin 2.4 (*)    Total Bilirubin 1.4 (*)    GFR, Estimated 55 (*)    All other components within normal limits  URINALYSIS, ROUTINE W REFLEX MICROSCOPIC - Abnormal; Notable for the following components:   APPearance TURBID (*)    Hgb urine dipstick MODERATE (*)    Protein, ur 30 (*)    Leukocytes,Ua LARGE (*)    RBC / HPF >50 (*)    WBC, UA >50 (*)    Bacteria, UA MANY (*)    All other components within normal limits  LACTIC ACID, PLASMA - Abnormal; Notable for the following components:   Lactic Acid, Venous 2.6 (*)    All other components within normal limits  LACTIC ACID, PLASMA - Abnormal; Notable for the following components:   Lactic Acid, Venous 2.1 (*)    All other components within normal limits  APTT - Abnormal; Notable for the following components:   aPTT 21 (*)    All other components within normal limits  RESP PANEL BY RT-PCR (FLU A&B, COVID) ARPGX2  CULTURE, BLOOD (ROUTINE X 2)  CULTURE, BLOOD (ROUTINE X 2)  URINE CULTURE   SURGICAL PCR SCREEN  LIPASE, BLOOD  PROTIME-INR    EKG EKG Interpretation  Date/Time:  Wednesday August 12 2020 10:17:13 EDT Ventricular Rate:  106 PR Interval:  132 QRS Duration: 92 QT Interval:  350 QTC  Calculation: 464 R Axis:   47 Text Interpretation: Sinus tachycardia with Premature atrial complexes Incomplete right bundle branch block Borderline ECG No previous ECGs available Confirmed by Fredia Sorrow [phone]) on 08/12/2020 10:24:15 AM  Radiology CT ABDOMEN PELVIS W CONTRAST  Result Date: 08/12/2020 CLINICAL DATA:  Abdominal pain, fever EXAM: CT ABDOMEN AND PELVIS WITH CONTRAST TECHNIQUE: Multidetector CT imaging of the abdomen and pelvis was performed using the standard protocol following bolus administration of intravenous contrast. CONTRAST:  2mL OMNIPAQUE IOHEXOL 350 MG/ML SOLN COMPARISON:  None. FINDINGS: Lower chest: Lung bases are clear. No effusions. Heart is normal size. Hepatobiliary: No focal hepatic abnormality. Gallbladder unremarkable. Pancreas: No focal abnormality or ductal dilatation. Spleen: No focal abnormality.  Normal size. Adrenals/Urinary Tract: No adrenal abnormality. No focal renal abnormality. No stones or hydronephrosis. Urinary bladder is unremarkable. Stomach/Bowel: There is sigmoid diverticulosis. Wall thickening noted within the sigmoid colon with surrounding inflammation and extraluminal gas most compatible with diverticulitis although perforated neoplasm cannot be completely excluded. Stomach and small bowel decompressed. Vascular/Lymphatic: No evidence of aneurysm or adenopathy. Reproductive: No visible focal abnormality. Other: Large complex gas and fluid collection noted throughout the left anterior abdominal wall and left lower abdominal wall musculature. This is concerning for possible fistulous communication to the intra-abdominal cavity related to inflammatory process in the left lower quadrant. Fluid and gas collection in the left lower abdominal  wall measures 11.5 x 5.1 cm on image 71 of series 2. On coronal imaging, this extends over approximately 18 cm craniocaudal length. Musculoskeletal: No acute bony abnormality. IMPRESSION: Inflammatory process in the left lower quadrant consisting of sigmoid diverticulosis with wall thickening, surrounding inflammation and extraluminal gas as well as large fluid and gas collection extending into the left abdominal wall musculature and subcutaneous soft tissues in the left lower abdomen and pelvis. Findings concerning for perforated diverticulitis with associated fistulous connection to the abdominal wall. Perforated neoplasm cannot be completely excluded. These results were called by telephone at the time of interpretation on 08/12/2020 at 11:54 am to provider [facility] , who verbally acknowledged these results. Electronically Signed   By: Rolm Baptise M.D.   On: 08/12/2020 11:58   DG Chest Port 1 View  Result Date: 08/12/2020 CLINICAL DATA:  Questionable sepsis.  Left abdominal pain. EXAM: PORTABLE CHEST 1 VIEW COMPARISON:  None. FINDINGS: Heart and mediastinal contours are within normal limits. No focal opacities or effusions. No acute bony abnormality. IMPRESSION: No active disease. Electronically Signed   By: Rolm Baptise M.D.   On: 08/12/2020 10:30    Procedures .Critical Care  Date/Time: 08/12/2020 1:47 PM Performed by: Suzy Bouchard, PA-C Authorized by: Suzy Bouchard, PA-C   Critical care provider statement:    Critical care time (minutes):  40   Critical care was necessary to treat or prevent imminent or life-threatening deterioration of the following conditions:  Sepsis   Critical care was time spent personally by me on the following activities:  Discussions with consultants, evaluation of patient's response to treatment, examination of patient, ordering and performing treatments and interventions, ordering and review of laboratory studies, ordering and review of radiographic  studies, pulse oximetry, re-evaluation of patient's condition, obtaining history from patient or surrogate and review of old charts   I assumed direction of critical care for this patient from another provider in my specialty: no     Care discussed with: admitting provider     Medications Ordered in ED Medications  lactated ringers infusion (has no administration in  time range)  mupirocin ointment (BACTROBAN) 2 % 1 application (has no administration in time range)  ceFEPIme (MAXIPIME) 2 g in sodium chloride 0.9 % 100 mL IVPB (0 g Intravenous Stopped 08/12/20 1121)  metroNIDAZOLE (FLAGYL) IVPB 500 mg (0 mg Intravenous Stopped 08/12/20 1226)  lactated ringers bolus 1,000 mL (0 mLs Intravenous Stopped 08/12/20 1226)    And  lactated ringers bolus 1,000 mL (0 mLs Intravenous Stopped 08/12/20 1330)    And  lactated ringers bolus 250 mL (250 mLs Intravenous New Bag/Given 08/12/20 1336)  vancomycin (VANCOREADY) IVPB 1500 mg/300 mL (0 mg Intravenous Stopped 08/12/20 1330)  iohexol (OMNIPAQUE) 350 MG/ML injection 100 mL (80 mLs Intravenous Contrast Given 08/12/20 1119)  sodium chloride (PF) 0.9 % injection (  Given by Other 08/12/20 1142)    ED Course  I have reviewed the triage vital signs and the nursing notes.  Pertinent labs & imaging results that were available during my care of the patient were reviewed by me and considered in my medical decision making (see chart for details).  Clinical Course as of 08/12/20 1347  Wed Aug 12, 2020  1056 WBC(!): 36.0 [CA]  1105 Lactic Acid, Venous(!!): 2.6 [CA]  1132 Sodium(!): 129 [CA]  1132 Creatinine(!): 1.45 [CA]  1132 BUN(!): 34 [CA]    Clinical Course User Index [CA] Suzy Bouchard, PA-C   MDM Rules/Calculators/A&P                         60 year old male presents to the ED due to lower abdominal pain associated with nightly fevers and 20lb unintentional weight loss.  Upon arrival, patient febrile and tachycardic.  Code sepsis initiated after  initial evaluation.  Patient nontoxic-appearing.  Abdomen with erythema in lower quadrants with induration concerning for abdominal wall cellulitis. No fluctuance.  Erythema does not spread to inguinal region.  No testicular pain or edema. Sepsis labs ordered. IVFs (30cc/kg) and antibiotics started. CT abdomen to evaluated infection. Discussed with Dr. Rogene Houston who agrees with assessment and plan.   CBC significant for leukocytosis at 36,000.  Anemia with hemoglobin 9.8.  CMP significant for hyponatremia 129, hyperglycemia 138, AKI with creatinine 1.45 and BUN of 34.  Lipase normal at 18.  Chest x-ray personally reviewed which is negative for any acute abnormalities.  Lactic acid elevated 2.6. COVID/influenza negative.   CT abdomen personally reviewed which demonstrates: IMPRESSION: Inflammatory process in the left lower quadrant consisting of sigmoid diverticulosis with wall thickening, surrounding inflammation and extraluminal gas as well as large fluid and gas collection extending into the left abdominal wall musculature and subcutaneous soft tissues in the left lower abdomen and pelvis. Findings concerning for perforated diverticulitis with associated fistulous connection to the abdominal wall. Perforated neoplasm cannot be completely excluded.  Discussed with Dr. Kieth Brightly with general surgery who will admit patient.  Final Clinical Impression(s) / ED Diagnoses Final diagnoses:  Lower abdominal pain    Rx / DC Orders ED Discharge Orders     None        Karie Kirks 08/12/20 1348    Fredia Sorrow, MD 08/13/20 [phone]

## 2020-08-12 NOTE — ED Triage Notes (Signed)
Pt reports swelling and LLQ abd pain radiating to LUQ x1 month. Pt reports suppose to have CT scan 7/26 and patient reports pain to bad to wait.

## 2020-08-12 NOTE — Consult Note (Signed)
NAME:  Roger Brennan, MRN:  [id number], DOB:  1960/05/19, LOS: 0 ADMISSION DATE:  08/12/2020, CONSULTATION DATE:  08/12/20 REFERRING MD:  Kinsinger - CCS, CHIEF COMPLAINT:  abdominal pain // colon perforation   History of Present Illness:  History obtained from chart review  60 yo M without significant PMH who presented to [facility] ED 08/12/20 with LLQ abdominal pain at the direction of his PCP. Pain ongoing x 2-3 weeks, associated area of induration. Pain and area of induration have progressively increased. Poor appetite and decreased PO intake during this time. Some history of constipation in June as well an estimated 20lb weight loss over the past 6 weeks per wife.  During ED evaluation, a CT a/p was obtained which revealed a color perforation, abscess formation and fistulization to abdominal wall. HDS in the ED, got 2L LR and broad abx (vanc cefepime flagyl). The patient was taken emergently to the OR for ex lap, debridement  of necrotizing soft tissue infection, partial colectomy with end colostomy.   Following the case, the patient remains will be admitted to the ICU. CCM consulted in this setting. Pertinent  Medical History  Inguinal hernia repair (distant)   Significant Hospital Events: Including procedures, antibiotic start and stop dates in addition to other pertinent events   7/13 ED presentation with worsening abdominal pain. 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 On vanc/cefepime/flagyl pre op.   Interim History / Subjective:  Arrives to ICU following case.  Started on low dose neo during case and received crystalloid resusc approx 5L between Ed and OR. EBL 232ml   Objective   Blood pressure 120/79, pulse (!) 115, temperature 98.9 F (37.2 C), temperature source Oral, resp. rate 15, height 5\' 10"  (1.778 m), weight 73.9 kg, SpO2 100 %.        Intake/Output Summary (Last 24 hours) at 08/12/2020 1557 Last  data filed at 08/12/2020 1554 Gross per 24 hour  Intake 4500 ml  Output --  Net 4500 ml   Filed Weights   08/12/20 0958  Weight: 73.9 kg    Examination: General: wdwn ill appearing middle aged M, reclined in PAU bed NAD  HENT: NCAT. Chipped tooth. Anicteric sclera pink tacky mm Lungs: CTAb symmetrical chest expansion, even and unlabored  Cardiovascular: tachycardic s1s2 no rgm cap refill brisk  Abdomen: Midline surgical site dressing clean and dry. Soft abdomen. Ostomy pink.   Extremities: L radial arterial line. No acute joint deformity no cyanosis or clubbing  Neuro: drowsy, awake. Confused, following commands.  GU: foley collecting yellow urine. 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 No pressors needed currently   AKI P -IVF -trend renal indices, strict I/O   Lactic Acidosis -related to severe sepsis above  P -cont Ivf and  abx -no great utility in repeating at this point, anticipate will be elevated   Hyponatremia, mild  -poor po intake -- suspect a prerenal injury but possible ATN now with severe sepsis  P -IVF as above   Best Practice (right click and "Reselect all SmartList Selections" daily)   Diet/type: NPO DVT prophylaxis: SCD GI prophylaxis: PPI Lines: Arterial Line Foley:  Yes,  and it is still needed Code Status:  full code Last date of multidisciplinary goals of care discussion [per primary]  Labs   CBC: Recent Labs  Lab 08/12/20 1010  WBC 36.0*  NEUTROABS 33.5*  HGB 9.8*  HCT 29.2*  MCV 92.4  PLT 477*    Basic Metabolic Panel: Recent Labs  Lab 08/12/20 1010  NA 129*  K 4.8  CL 95*  CO2 20*  GLUCOSE 138*  BUN 34*  CREATININE 1.45*  CALCIUM 8.5*   GFR: Estimated Creatinine Clearance: 55.9 mL/min (A) (by C-G formula based on SCr of 1.45 mg/dL (H)). Recent Labs  Lab 08/12/20 1010 08/12/20 1215  WBC 36.0*  --   LATICACIDVEN 2.6* 2.1*    Liver Function Tests: Recent Labs  Lab 08/12/20 1010  AST 21  ALT 25  ALKPHOS 118  BILITOT 1.4*  PROT 6.1*  ALBUMIN 2.4*   Recent Labs  Lab 08/12/20 1010  LIPASE 18   No results for input(s): AMMONIA in the last 168 hours.  ABG No results found for: PHART, PCO2ART, PO2ART, HCO3, TCO2, ACIDBASEDEF, O2SAT   Coagulation Profile: Recent Labs  Lab 08/12/20 1010  INR 1.2    Cardiac Enzymes: No results for input(s): CKTOTAL, CKMB, CKMBINDEX, TROPONINI in the last 168 hours.  HbA1C: No results found for: HGBA1C  CBG: No results for input(s): GLUCAP in the last 168 hours.  Review of Systems:   Unable to obtain from patient due to encephalopathy  Past Medical History:  He,  has no past medical history on file.   Surgical History:  History reviewed. No pertinent surgical history.  Bilateral inguinal hernia repair at age 81  Social History:   reports that he has never smoked. He has never been exposed to tobacco smoke. He has never used smokeless tobacco. He reports that he does not drink alcohol and does not use drugs.   Family History:  His family history is not on file. Pt was adopted and family history is unknown.   Allergies No Known Allergies   Home Medications  Prior to Admission medications   Medication Sig Start Date End Date Taking? Authorizing Provider  aspirin EC  81 MG tablet Take 81 mg by mouth daily. Swallow whole.   Yes [provider]  traMADol (ULTRAM) 50 MG tablet Take 50 mg by mouth as needed (pain).   Yes [provider]     Critical care time: 45 minutes     CRITICAL CARE Performed by: Cristal Generous   Total critical care time: 45 minutes  Critical care time was exclusive of separately billable procedures and treating other patients.  Critical care was necessary to treat or prevent imminent or life-threatening deterioration.  Critical care was time spent personally by me on the following activities: development of treatment plan with patient and/or surrogate as well as nursing, discussions with consultants, evaluation of patient's response to treatment, examination of patient, obtaining history from patient or surrogate, ordering and performing treatments and interventions, ordering and review of laboratory studies, ordering and review of radiographic studies, pulse oximetry and re-evaluation of patient's condition.  Eliseo Gum MSN, AGACNP-BC [place] for pager  08/12/2020, 6:04 PM

## 2020-08-12 NOTE — ED Notes (Signed)
PA at the bedside to discuss plan of care

## 2020-08-12 NOTE — Progress Notes (Signed)
Elink following sepsis 

## 2020-08-12 NOTE — ED Notes (Signed)
Patient to CT.

## 2020-08-12 NOTE — Anesthesia Postprocedure Evaluation (Signed)
Anesthesia Post Note  Patient: Roger Brennan  Procedure(s) Performed: [place]     Patient location during evaluation: PACU Anesthesia Type: General Level of consciousness: awake and alert Pain management: pain level controlled Vital Signs Assessment: post-procedure vital signs reviewed and stable Respiratory status: spontaneous breathing, nonlabored ventilation, respiratory function stable and patient connected to nasal cannula oxygen Cardiovascular status: blood pressure returned to baseline and stable Postop Assessment: no apparent nausea or vomiting Anesthetic complications: no   No notable events documented.  Last Vitals:  Vitals:   08/12/20 1900 08/12/20 2000  BP: 103/66 94/72  Pulse: (!) 102 98  Resp: 15 15  Temp:    SpO2: 100% 99%    Last Pain:  Vitals:   08/12/20 2024  TempSrc:   PainSc: 7                  [name] S

## 2020-08-12 NOTE — Progress Notes (Signed)
A consult was received from an ED physician for vancomycin & cefepime per pharmacy dosing.  The patient's profile has been reviewed for ht/wt/allergies/indication/available labs.   A one time order has been placed for vancomycin 1500 mg, cefepime 2 gm and flagyl 500 mg.    Further antibiotics/pharmacy consults should be ordered by admitting physician if indicated.                       Thank you,  Eudelia Bunch, Pharm.D 08/12/2020 10:11 AM

## 2020-08-12 NOTE — Anesthesia Preprocedure Evaluation (Addendum)
Anesthesia Evaluation  Patient identified by MRN, date of birth, ID band Patient awake    Reviewed: Allergy & Precautions, NPO status , Patient's Chart, lab work & pertinent test results  History of Anesthesia Complications Negative for: history of anesthetic complications  Airway Mallampati: II  TM Distance: >3 FB Neck ROM: Full    Dental  (+) Dental Advisory Given, Chipped   Pulmonary neg pulmonary ROS,    breath sounds clear to auscultation       Cardiovascular hypertension (no longer requires meds),  Rhythm:Regular Rate:Normal     Neuro/Psych negative neurological ROS     GI/Hepatic Neg liver ROS, Perforated divertic   Endo/Other  negative endocrine ROS  Renal/GU Renal InsufficiencyRenal disease (creat 1.45)     Musculoskeletal   Abdominal   Peds  Hematology  (+) Blood dyscrasia (Hb 9.8), anemia ,   Anesthesia Other Findings   Reproductive/Obstetrics                            Anesthesia Physical Anesthesia Plan  ASA: 2 and emergent  Anesthesia Plan: General   Post-op Pain Management:    Induction: Intravenous and Rapid sequence  PONV Risk Score and Plan: 2 and Ondansetron and Dexamethasone  Airway Management Planned: Oral ETT  Additional Equipment: Arterial line  Intra-op Plan:   Post-operative Plan: Post-operative intubation/ventilation  Informed Consent: I have reviewed the patients History and Physical, chart, labs and discussed the procedure including the risks, benefits and alternatives for the proposed anesthesia with the patient or authorized representative who has indicated his/her understanding and acceptance.     Dental advisory given  Plan Discussed with: CRNA, Surgeon and Anesthesiologist  Anesthesia Plan Comments:      Anesthesia Quick Evaluation

## 2020-08-12 NOTE — Progress Notes (Addendum)
Pharmacy Antibiotic Note  Roger Brennan is a 60 y.o. male admitted on 08/12/2020 with intra-abdominal infection.  Pharmacy has been consulted for Zosyn, vancomycin dosing.  Plan: Zosyn 3.375g IV Q8H infused over 4hrs.  Vancomycin 1500 mg IV x1 then 1250 mg IV q24h (SCr 1.45, est AUC 461) Measure Vanc peak and trough at steady state.  Goal AUC = 400 - 550. Follow up renal function, culture results, and clinical course. Daily SCr while on both Vancomycin and Zosyn.     Height: 5\' 10"  (177.8 cm) Weight: 73.9 kg (163 lb) IBW/kg (Calculated) : 73  Temp (24hrs), Avg:99 F (37.2 C), Min:98 F (36.7 C), Max:100.4 F (38 C)  Recent Labs  Lab 08/12/20 1010 08/12/20 1215  WBC 36.0*  --   CREATININE 1.45*  --   LATICACIDVEN 2.6* 2.1*    Estimated Creatinine Clearance: 55.9 mL/min (A) (by C-G formula based on SCr of 1.45 mg/dL (H)).    No Known Allergies    Thank you for allowing pharmacy to be a part of this patient's care.  Gretta Arab PharmD, BCPS Clinical Pharmacist [facility] main pharmacy [phone] 08/12/2020 6:10 PM

## 2020-08-12 NOTE — ED Notes (Signed)
Patient currently in CT.  Patient has been alert and oriented.  Talkative.  Wife is at the bedside.

## 2020-08-12 NOTE — Anesthesia Procedure Notes (Signed)
Arterial Line Insertion Start/End7/13/2022 3:10 PM, 08/12/2020 3:15 PM Performed by: Barnet Glasgow, MD, Raenette Rover, CRNA, CRNA  Patient location: OR. Preanesthetic checklist: patient identified, IV checked, site marked, risks and benefits discussed, surgical consent, monitors and equipment checked, pre-op evaluation, timeout performed and anesthesia consent Left, radial was placed Catheter size: 20 G Hand hygiene performed  and maximum sterile barriers used  Allen's test indicative of satisfactory collateral circulation Attempts: 1 Procedure performed without using ultrasound guided technique. Following insertion, dressing applied. Post procedure assessment: normal  Patient tolerated the procedure well with no immediate complications.

## 2020-08-12 NOTE — Transfer of Care (Signed)
Immediate Anesthesia Transfer of Care Note  Patient: Roger Brennan  Procedure(s) Performed: EXPLORATORY LAPAROTOMY,PARTIAL COLECTOMY  Patient Location: PACU  Anesthesia Type:General  Level of Consciousness: awake, alert , oriented and patient cooperative  Airway & Oxygen Therapy: Patient Spontanous Breathing and Patient connected to nasal cannula oxygen  Post-op Assessment: Report given to RN and Post -op Vital signs reviewed and stable  Post vital signs: Reviewed and stable  Last Vitals:  Vitals Value Taken Time  BP 105/74 08/12/20 1737  Temp    Pulse 105 08/12/20 1746  Resp    SpO2 98 % 08/12/20 1746  Vitals shown include unvalidated device data.  Last Pain:  Vitals:   08/12/20 1148  TempSrc: Oral  PainSc:          Complications: No notable events documented.

## 2020-08-12 NOTE — ED Notes (Signed)
Report given to [place] in Short Stay- Patient will be ready for surgery

## 2020-08-12 NOTE — Progress Notes (Signed)
eLink Physician-Brief Progress Note Patient Name: Thomas Mabry DOB: 1960-11-06 MRN: [id number]   Date of Service  08/12/2020  HPI/Events of Note  Patient admitted to the ICU post exploratory laparotomy, partial colectomy, end colostomy, wound debridement of a ruptured diverticulitis with necrotizing wound cellulitis, abscess, and sepsis.  eICU Interventions  New Patient Evaluation.        Frederik Pear 08/12/2020, 7:47 PM

## 2020-08-13 ENCOUNTER — Encounter (HOSPITAL_COMMUNITY): Payer: Self-pay | Admitting: General Surgery

## 2020-08-13 DIAGNOSIS — R1030 Lower abdominal pain, unspecified: Secondary | ICD-10-CM | POA: Diagnosis not present

## 2020-08-13 DIAGNOSIS — Z4659 Encounter for fitting and adjustment of other gastrointestinal appliance and device: Secondary | ICD-10-CM

## 2020-08-13 DIAGNOSIS — K631 Perforation of intestine (nontraumatic): Secondary | ICD-10-CM | POA: Diagnosis not present

## 2020-08-13 LAB — CBC
HCT: 23.9 % — ABNORMAL LOW (ref 39.0–52.0)
HCT: 23 % — ABNORMAL LOW (ref 39.0–52.0)
Hemoglobin: 8 g/dL — ABNORMAL LOW (ref 13.0–17.0)
Hemoglobin: 7.8 g/dL — ABNORMAL LOW (ref 13.0–17.0)
MCH: 31 pg (ref 26.0–34.0)
MCH: 31.3 pg (ref 26.0–34.0)
MCHC: 33.5 g/dL (ref 30.0–36.0)
MCHC: 33.9 g/dL (ref 30.0–36.0)
MCV: 92.6 fL (ref 80.0–100.0)
MCV: 92.4 fL (ref 80.0–100.0)
Platelets: 451 10*3/uL — ABNORMAL HIGH (ref 150–400)
Platelets: 421 10*3/uL — ABNORMAL HIGH (ref 150–400)
RBC: 2.58 MIL/uL — ABNORMAL LOW (ref 4.22–5.81)
RBC: 2.49 MIL/uL — ABNORMAL LOW (ref 4.22–5.81)
RDW: 13.6 % (ref 11.5–15.5)
RDW: 13.7 % (ref 11.5–15.5)
WBC: 41.2 10*3/uL — ABNORMAL HIGH (ref 4.0–10.5)
WBC: 37.2 10*3/uL — ABNORMAL HIGH (ref 4.0–10.5)
nRBC: 0 % (ref 0.0–0.2)
nRBC: 0 % (ref 0.0–0.2)

## 2020-08-13 LAB — BASIC METABOLIC PANEL
Anion gap: 9 (ref 5–15)
BUN: 32 mg/dL — ABNORMAL HIGH (ref 6–20)
CO2: 20 mmol/L — ABNORMAL LOW (ref 22–32)
Calcium: 7.7 mg/dL — ABNORMAL LOW (ref 8.9–10.3)
Chloride: 103 mmol/L (ref 98–111)
Creatinine, Ser: 1.2 mg/dL (ref 0.61–1.24)
GFR, Estimated: >60 mL/min (ref >60)
Glucose, Bld: 185 mg/dL — ABNORMAL HIGH (ref 70–99)
Potassium: 4.2 mmol/L (ref 3.5–5.1)
Sodium: 132 mmol/L — ABNORMAL LOW (ref 135–145)

## 2020-08-13 LAB — GLUCOSE, CAPILLARY
Glucose-Capillary: 160 mg/dL — ABNORMAL HIGH (ref 70–99)
Glucose-Capillary: 162 mg/dL — ABNORMAL HIGH (ref 70–99)
Glucose-Capillary: 162 mg/dL — ABNORMAL HIGH (ref 70–99)
Glucose-Capillary: 164 mg/dL — ABNORMAL HIGH (ref 70–99)
Glucose-Capillary: 175 mg/dL — ABNORMAL HIGH (ref 70–99)
Glucose-Capillary: 175 mg/dL — ABNORMAL HIGH (ref 70–99)
Glucose-Capillary: 177 mg/dL — ABNORMAL HIGH (ref 70–99)

## 2020-08-13 LAB — COMPREHENSIVE METABOLIC PANEL
ALT: 18 U/L (ref 0–44)
AST: 17 U/L (ref 15–41)
Albumin: 1.9 g/dL — ABNORMAL LOW (ref 3.5–5.0)
Alkaline Phosphatase: 81 U/L (ref 38–126)
Anion gap: 9 (ref 5–15)
BUN: 32 mg/dL — ABNORMAL HIGH (ref 6–20)
CO2: 20 mmol/L — ABNORMAL LOW (ref 22–32)
Calcium: 7.9 mg/dL — ABNORMAL LOW (ref 8.9–10.3)
Chloride: 102 mmol/L (ref 98–111)
Creatinine, Ser: 1.11 mg/dL (ref 0.61–1.24)
GFR, Estimated: >60 mL/min (ref >60)
Glucose, Bld: 185 mg/dL — ABNORMAL HIGH (ref 70–99)
Potassium: 4.6 mmol/L (ref 3.5–5.1)
Sodium: 131 mmol/L — ABNORMAL LOW (ref 135–145)
Total Bilirubin: 1.5 mg/dL — ABNORMAL HIGH (ref 0.3–1.2)
Total Protein: 4.7 g/dL — ABNORMAL LOW (ref 6.5–8.1)

## 2020-08-13 LAB — HIV ANTIBODY (ROUTINE TESTING W REFLEX): HIV Screen 4th Generation wRfx: NONREACTIVE

## 2020-08-13 MED ORDER — ACETAMINOPHEN 10 MG/ML IV SOLN
1000.0000 mg | Freq: Four times a day (QID) | INTRAVENOUS | Status: AC
  Administered 2020-08-13 – 2020-08-14 (×3): 1000 mg via INTRAVENOUS
  Filled 2020-08-13 (×3): qty 100

## 2020-08-13 MED ORDER — ENOXAPARIN SODIUM 40 MG/0.4ML IJ SOSY: 40.0000 mg | PREFILLED_SYRINGE | INTRAMUSCULAR | Status: DC

## 2020-08-13 MED ORDER — VANCOMYCIN HCL 1500 MG/300ML IV SOLN
1500.0000 mg | INTRAVENOUS | Status: DC
  Filled 2020-08-13: qty 300

## 2020-08-13 NOTE — Progress Notes (Signed)
Pharmacy Antibiotic Note  Roger Brennan is a 60 y.o. male admitted on 08/12/2020 with intra-abdominal infection.  Pharmacy has been consulted for Zosyn, vancomycin dosing. Clindamycin added 7/13 PM.  Day #2 abx - Afebrile - WBC 41.2, increased - SCr 1.1, improved  Plan: Zosyn 3.375g IV Q8H infused over 4hrs.  Adjust Vancomycin to 1500 mg IV q24h (SCr 1.1, est AUC 429) Measure Vanc peak and trough at steady state.  Goal AUC = 400 - 550. Follow up renal function, culture results, and clinical course. Daily SCr while on both Vancomycin and Zosyn.     Height: 5\' 10"  (177.8 cm) Weight: 73.9 kg (163 lb) IBW/kg (Calculated) : 73  Temp (24hrs), Avg:98 F (36.7 C), Min:97.6 F (36.4 C), Max:98.9 F (37.2 C)  Recent Labs  Lab 08/12/20 1010 08/12/20 1215 08/12/20 1833 08/13/20 0234  WBC 36.0*  --  38.8* 41.2*  CREATININE 1.45*  --  1.24 1.11  LATICACIDVEN 2.6* 2.1*  --   --      Estimated Creatinine Clearance: 73.1 mL/min (by C-G formula based on SCr of 1.11 mg/dL).    No Known Allergies  Antimicrobials this admission:  7/13 Vanc >>  7/13 cefepime x 1 7/13 flagyl x 1 7/13 Clindamycin >> 7/13 Zosyn >>   Dose adjustments this admission:  7/14 increase vanc from 1250mg  to 1500mg  q24h for improved SCr  Microbiology results:  7/13 UCx: reincubating 7/13 BCx2: ngtd  Thank you for allowing pharmacy to be a part of this patient's care.  Peggyann Juba, PharmD, BCPS [facility] pharmacy [phone] 08/13/2020 10:05 AM

## 2020-08-13 NOTE — Progress Notes (Addendum)
Progress Note  1 Day Post-Op  Subjective: Patient reports some pain but not uncontrolled. He has been able to use PCA when needed. He has not had any output from stoma yet but denies nausea.   Objective: Vital signs in last 24 hours: Temp:  [97.6 F (36.4 C)-100.4 F (38 C)] 97.7 F (36.5 C) (07/14 0400) Pulse Rate:  [85-126] 85 (07/14 0600) Resp:  [12-22] 12 (07/14 0730) BP: (78-120)/(52-79) 84/57 (07/14 0100) SpO2:  [95 %-100 %] 97 % (07/14 0730) Arterial Line BP: (93-138)/(39-59) 111/49 (07/14 0600) FiO2 (%):  [26 %] 26 % (07/14 0021) Weight:  [73.9 kg] 73.9 kg (07/13 0958) Last BM Date: 08/11/20  Intake/Output from previous day: 07/13 0701 - 07/14 0700 In: 8515.4 [I.V.:4739.6; IV Piggyback:3775.9] Out: 1675 [Urine:1325; Emesis/NG output:150; Blood:200] Intake/Output this shift: No intake/output data recorded.  PE: General: pleasant, WD, WN male who is laying in bed in NAD Heart: regular, rate, and rhythm.   Lungs: CTAB, no wheezes, rhonchi, or rales noted.  Respiratory effort nonlabored Abd: appropriately ttp, abd wall indurated with some erythema still present but improving, wound with some necrotic tissue but not a lot of purulent appearing drainage (see photos below), stoma viable with SS fluid in ostomy appliance      MS: all 4 extremities are symmetrical with no cyanosis, clubbing, or edema. Skin: warm and dry with no masses, lesions, or rashes Neuro: Cranial nerves 2-12 grossly intact, sensation is normal throughout Psych: A&Ox3 with an appropriate affect.    Lab Results:  Recent Labs    08/12/20 1833 08/13/20 0234  WBC 38.8* 41.2*  HGB 9.6* 8.0*  HCT 29.1* 23.9*  PLT 499* 451*   BMET Recent Labs    08/12/20 1833 08/13/20 0234  NA 131* 131*  K 5.3* 4.6  CL 98 102  CO2 22 20*  GLUCOSE 145* 185*  BUN 31* 32*  CREATININE 1.24 1.11  CALCIUM 8.1* 7.9*   PT/INR Recent Labs    08/12/20 1010  LABPROT 15.1  INR 1.2   CMP     Component  Value Date/Time   NA 131 (L) 08/13/2020 0234   K 4.6 08/13/2020 0234   CL 102 08/13/2020 0234   CO2 20 (L) 08/13/2020 0234   GLUCOSE 185 (H) 08/13/2020 0234   BUN 32 (H) 08/13/2020 0234   CREATININE 1.11 08/13/2020 0234   CALCIUM 7.9 (L) 08/13/2020 0234   PROT 4.7 (L) 08/13/2020 0234   ALBUMIN 1.9 (L) 08/13/2020 0234   AST 17 08/13/2020 0234   ALT 18 08/13/2020 0234   ALKPHOS 81 08/13/2020 0234   BILITOT 1.5 (H) 08/13/2020 0234   GFRNONAA >60 08/13/2020 0234   Lipase     Component Value Date/Time   LIPASE 18 08/12/2020 1010       Studies/Results: CT ABDOMEN PELVIS W CONTRAST  Result Date: 08/12/2020 CLINICAL DATA:  Abdominal pain, fever EXAM: CT ABDOMEN AND PELVIS WITH CONTRAST TECHNIQUE: Multidetector CT imaging of the abdomen and pelvis was performed using the standard protocol following bolus administration of intravenous contrast. CONTRAST:  97mL OMNIPAQUE IOHEXOL 350 MG/ML SOLN COMPARISON:  None. FINDINGS: Lower chest: Lung bases are clear. No effusions. Heart is normal size. Hepatobiliary: No focal hepatic abnormality. Gallbladder unremarkable. Pancreas: No focal abnormality or ductal dilatation. Spleen: No focal abnormality.  Normal size. Adrenals/Urinary Tract: No adrenal abnormality. No focal renal abnormality. No stones or hydronephrosis. Urinary bladder is unremarkable. Stomach/Bowel: There is sigmoid diverticulosis. Wall thickening noted within the sigmoid colon with surrounding inflammation  and extraluminal gas most compatible with diverticulitis although perforated neoplasm cannot be completely excluded. Stomach and small bowel decompressed. Vascular/Lymphatic: No evidence of aneurysm or adenopathy. Reproductive: No visible focal abnormality. Other: Large complex gas and fluid collection noted throughout the left anterior abdominal wall and left lower abdominal wall musculature. This is concerning for possible fistulous communication to the intra-abdominal cavity related to  inflammatory process in the left lower quadrant. Fluid and gas collection in the left lower abdominal wall measures 11.5 x 5.1 cm on image 71 of series 2. On coronal imaging, this extends over approximately 18 cm craniocaudal length. Musculoskeletal: No acute bony abnormality. IMPRESSION: Inflammatory process in the left lower quadrant consisting of sigmoid diverticulosis with wall thickening, surrounding inflammation and extraluminal gas as well as large fluid and gas collection extending into the left abdominal wall musculature and subcutaneous soft tissues in the left lower abdomen and pelvis. Findings concerning for perforated diverticulitis with associated fistulous connection to the abdominal wall. Perforated neoplasm cannot be completely excluded. These results were called by telephone at the time of interpretation on 08/12/2020 at 11:54 am to provider [facility] , who verbally acknowledged these results. Electronically Signed   By: Rolm Baptise M.D.   On: 08/12/2020 11:58   DG Chest Port 1 View  Result Date: 08/12/2020 CLINICAL DATA:  Questionable sepsis.  Left abdominal pain. EXAM: PORTABLE CHEST 1 VIEW COMPARISON:  None. FINDINGS: Heart and mediastinal contours are within normal limits. No focal opacities or effusions. No acute bony abnormality. IMPRESSION: No active disease. Electronically Signed   By: Rolm Baptise M.D.   On: 08/12/2020 10:30   DG Abd Portable 1V  Result Date: 08/12/2020 CLINICAL DATA:  Encounter for feeding tube placement. EXAM: PORTABLE ABDOMEN - 1 VIEW COMPARISON:  CT abdomen and pelvis of the same day. FINDINGS: Mild dilation of the colon again noted. Gastric tube in place. The side port is above the GE junction. Extra-abdominal gas noted on CT not well visualized by this radiograph. IMPRESSION: 1. Gastric tube in place, with the side port above the GE junction. 2. Persistent mild dilation of the colon. Electronically Signed   By: San Morelle M.D.   On:  08/12/2020 19:10    Anti-infectives: Anti-infectives (From admission, onward)    Start     Dose/Rate Route Frequency Ordered Stop   08/13/20 1000  vancomycin (VANCOREADY) IVPB 1250 mg/250 mL        1,250 mg 166.7 mL/hr over 90 Minutes Intravenous Every 24 hours 08/12/20 1910     08/12/20 2200  piperacillin-tazobactam (ZOSYN) IVPB 3.375 g  Status:  Discontinued        3.375 g 12.5 mL/hr over 240 Minutes Intravenous Every 8 hours 08/12/20 1904 08/12/20 1941   08/12/20 2000  clindamycin (CLEOCIN) IVPB 600 mg        600 mg 100 mL/hr over 30 Minutes Intravenous Every 8 hours 08/12/20 1757     08/12/20 1830  piperacillin-tazobactam (ZOSYN) IVPB 3.375 g        3.375 g 12.5 mL/hr over 240 Minutes Intravenous Every 8 hours 08/12/20 1811     08/12/20 1015  vancomycin (VANCOREADY) IVPB 1500 mg/300 mL        1,500 mg 150 mL/hr over 120 Minutes Intravenous  Once 08/12/20 1007 08/12/20 1330   08/12/20 1000  ceFEPIme (MAXIPIME) 2 g in sodium chloride 0.9 % 100 mL IVPB        2 g 200 mL/hr over 30 Minutes Intravenous  Once 08/12/20  1000 08/12/20 1121   08/12/20 1000  metroNIDAZOLE (FLAGYL) IVPB 500 mg        500 mg 100 mL/hr over 60 Minutes Intravenous  Once 08/12/20 1000 08/12/20 1226   08/12/20 1000  vancomycin (VANCOCIN) IVPB 1000 mg/200 mL premix  Status:  Discontinued        1,000 mg 200 mL/hr over 60 Minutes Intravenous  Once 08/12/20 1000 08/12/20 1007        Assessment/Plan 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 Kieth Brightly - pt tolerating dressing change well this AM - change dressing BID with saline WTD dressing. Ensure that would is fully packed into LLQ abdominal wall and wick into opening in RLQ abdominal wall  - clamp NGT and allow ice chips  - WBC 41 but pt afeb and tachycardia improving, BP soft overnight - continue broad  spectrum abx - PT/OT evals - WOC consult for new colostomy  Septic shock - secondary to above, on 1 pressor, low dose, appreciate CCM assistance  AKI - Cr 1.1 from 1.2 yesterday, continue to monitor  ABL anemia - hgb 8.0 this AM, may also be somewhat dilutional, recheck at 1200 today  FEN: ice chips, NGT clamped, IVF @100  cc/h VTE: SCDs, start LMWH tomorrow if hgb stable  ID: [email or web address] 7/13>> Foley: keep today, possibly remove 7/15  LOS: 1 day    Norm Parcel, [facility] 08/13/2020, 8:39 AM Please see Amion for pager number during day hours 7:00am-4:30pm

## 2020-08-13 NOTE — Progress Notes (Signed)
Minimal serous fluid in pouch, did not empty- awaiting wound change instructions

## 2020-08-13 NOTE — Consult Note (Signed)
[place] Nurse ostomy consult note Stoma type/location: LUQ, end colostomy; Dr. Kieth Brightly  S/P abdominal wall abscess with colonic perforation Stomal assessment/size: 1 3/8" round, budded, pink, moist Peristomal assessment: NA; pouch intact from OR Treatment options for stomal/peristomal skin: NA Output bloody; scant Ostomy pouching: 2pc. 2 3/4" in place from OR Education provided:  Explained role of ostomy nurse and creation of stoma  Patient lives with wife; she would like to be part of the education for care at home I will follow up with patient later today to arrange a time with his wife for first pouch change tomorrow.   Enrolled patient in [place] program: Yes (3) 2 3/4" pouches/skin barriers and 2" barrier rings left in the room  [place] Nurse team will follow with you and see patient within 10 days for wound assessments.  Please notify [place] nurses of any acute changes in the wounds or any new areas of concern [place] MSN, [place], [place], [place]

## 2020-08-13 NOTE — TOC Initial Note (Signed)
Transition of Care [facility]) - Initial/Assessment Note    Patient Details  Name: Cleophus Mendonsa MRN: [id number] Date of Birth: 06-21-1960  Transition of Care [facility]) CM/SW Contact:    Dessa Phi, RN Phone Number: 08/13/2020, 4:00 PM  Clinical Narrative: From home-spoke to spouse Debra d/c plans return home-HHC eeded-willcheck on agency to accept-need HHRN-ostomy instruction/HHPT.Currently-No-[facility],[place].                  Expected Discharge Plan: [place] Barriers to Discharge: Continued Medical Work up   Patient Goals and CMS Choice Patient states their goals for this hospitalization and ongoing recovery are:: go home CMS Medicare.gov Compare Post Acute Care list provided to:: Patient Represenative (must comment) Choice offered to / list presented to : Spouse  Expected Discharge Plan and Services Expected Discharge Plan: [place]   Discharge Planning Services: CM Consult   Living arrangements for the past 2 months: Single Family Home                                      Prior Living Arrangements/Services Living arrangements for the past 2 months: Single Family Home Lives with:: Spouse Patient language and need for interpreter reviewed:: Yes Do you feel safe going back to the place where you live?: Yes      Need for Family Participation in Patient Care: No (Comment) Care giver support system in place?: Yes (comment)   Criminal Activity/Legal Involvement Pertinent to Current Situation/Hospitalization: No - Comment as needed  Activities of Daily Living Home Assistive Devices/Equipment: None ADL Screening (condition at time of admission) Patient's cognitive ability adequate to safely complete daily activities?: Yes Is the patient deaf or have difficulty hearing?: No Does the patient have difficulty seeing, even when wearing glasses/contacts?: No Does the patient have difficulty concentrating, remembering, or making decisions?:  No Patient able to express need for assistance with ADLs?: Yes Does the patient have difficulty dressing or bathing?: No Independently performs ADLs?: Yes (appropriate for developmental age) Does the patient have difficulty walking or climbing stairs?: No Weakness of Legs: None Weakness of Arms/Hands: None  Permission Sought/Granted Permission sought to share information with : Case Manager Permission granted to share information with : Yes, Verbal Permission Granted  Share Information with NAME: Case Manager     Permission granted to share info w Relationship: Debra spouse [phone]     Emotional Assessment Appearance:: Appears stated age Attitude/Demeanor/Rapport: Gracious Affect (typically observed): Accepting Orientation: : Oriented to Place, Oriented to  Time, Oriented to Situation Alcohol / Substance Use: Not Applicable Psych Involvement: No (comment)  Admission diagnosis:  Lower abdominal pain [R10.30] Perforation of colon [facility]) [K63.1] Patient Active Problem List   Diagnosis Date Noted   Perforation of colon ([place]) 08/12/2020   PCP:  [name], MD Pharmacy:   [id number] [place], [place] [address] Phone: [phone] Fax: [phone]     Social Determinants of Health (SDOH) Interventions    Readmission Risk Interventions No flowsheet data found.

## 2020-08-13 NOTE — Evaluation (Signed)
Physical Therapy Evaluation Patient Details Name: Roger Brennan MRN: [id number] DOB: 1960/12/02 Today's Date: 08/13/2020   History of Present Illness  Patient is 60 yo  old male who presented to [facility]12[facility]20 at the direction of PCP with LLQ abdominal pain and induration for the last 2-3 weeks, 20 pound weight loss.  CT-Colon perforation with abscess and fistulization to abdominal wall .  S/P drainage and debridement of left lower quadrant abdominal wall ,. partial colectomy with end colostomy , Splenic mobilization on 08/12/20 emergently.  Clinical Impression  The patient  admitted for enmergent abdominal surgery/colostomy. PTA patient teaches tennis/very active.  Today patient tolerated  sitting up onto bedside with 2 max assist. Patient sat for ~ 8 minutes. Patient using PCA. HR 100, 2L [place], 100%  Patient should progress to return home . Pt admitted with above diagnosis.   Pt currently with functional limitations due to the deficits listed below (see PT Problem List). Pt will benefit from skilled PT to increase their independence and safety with mobility to allow discharge to the venue listed below.       Follow Up Recommendations Home health PT    Equipment Recommendations     TBA  Recommendations for Other Services       Precautions / Restrictions Precautions Precautions: Fall Precaution Comments: colostomy, mutiple lines      Mobility  Bed Mobility Overal bed mobility: Needs Assistance Bed Mobility: Rolling;Sidelying to Sit;Sit to Sidelying Rolling: Mod assist;+2 for physical assistance;+2 for safety/equipment Sidelying to sit: +2 for physical assistance;+2 for safety/equipment;Max assist      Sit to sidelying: Mod assist;+2 for safety/equipment;+2 for physical assistance General bed mobility comments: cues for rolling to right, flex knees. Assist to sit from  right side, use of bed pad to  assume sitting upright.  Assist with trunk and legs to return to side and then onto  back    Transfers             General transfer comment: TBA  Ambulation/Gait                Stairs            Wheelchair Mobility    Modified Rankin (Stroke Patients Only)       Balance Overall balance assessment: Needs assistance Sitting-balance support: Feet unsupported;Bilateral upper extremity supported Sitting balance-Leahy Scale: Fair Sitting balance - Comments: held onto mattress                                     Pertinent Vitals/Pain Pain Assessment: 0-10 Pain Score: 2  Pain Location: abdomen Pain Descriptors / Indicators: Discomfort Pain Intervention(s): PCA encouraged    Home Living Family/patient expects to be discharged to:: Private residence Living Arrangements: Spouse/significant other Available Help at Discharge: Family Type of Home: House Home Access: Stairs to enter Entrance Stairs-Rails: Psychiatric nurse of Steps: 4 Home Layout: Two level;Able to live on main level with bedroom/bathroom Home Equipment: None      Prior Function Level of Independence: Independent         Comments: teaches tennis     Hand Dominance   Dominant Hand: Right    Extremity/Trunk Assessment        Lower Extremity Assessment Lower Extremity Assessment: Overall WFL for tasks assessed    Cervical / Trunk Assessment Cervical / Trunk Assessment: Normal;Other exceptions Cervical / Trunk Exceptions: some guarded when sitting  Communication   Communication: No difficulties  Cognition Arousal/Alertness: Awake/alert Behavior During Therapy: WFL for tasks assessed/performed Overall Cognitive Status: Within Functional Limits for tasks assessed                                        General Comments      Exercises     Assessment/Plan    PT Assessment Patient needs continued PT services  PT Problem List Decreased strength;Decreased knowledge of precautions;Decreased mobility;Decreased  knowledge of use of DME;Decreased activity tolerance;Pain       PT Treatment Interventions DME instruction;Therapeutic activities;Gait training;Therapeutic exercise;Functional mobility training    PT Goals (Current goals can be found in the Care Plan section)  Acute Rehab PT Goals Patient Stated Goal: agreed to sitting, return home, tennis PT Goal Formulation: With patient Time For Goal Achievement: 08/27/20 Potential to Achieve Goals: Good    Frequency Min 3X/week   Barriers to discharge        Co-evaluation PT/OT/SLP Co-Evaluation/Treatment: Yes Reason for Co-Treatment: Complexity of the patient's impairments (multi-system involvement) PT goals addressed during session: Mobility/safety with mobility OT goals addressed during session: ADL's and self-care       AM-PAC PT "6 Clicks" Mobility  Outcome Measure Help needed turning from your back to your side while in a flat bed without using bedrails?: A Lot Help needed moving from lying on your back to sitting on the side of a flat bed without using bedrails?: A Lot Help needed moving to and from a bed to a chair (including a wheelchair)?: Total Help needed standing up from a chair using your arms (e.g., wheelchair or bedside chair)?: Total Help needed to walk in hospital room?: Total Help needed climbing 3-5 steps with a railing? : Total 6 Click Score: 8    End of Session Equipment Utilized During Treatment: Oxygen Activity Tolerance: Patient tolerated treatment well Patient left: in bed;with call bell/phone within reach;with nursing/sitter in room Nurse Communication: Mobility status PT Visit Diagnosis: Unsteadiness on feet (R26.81);Difficulty in walking, not elsewhere classified (R26.2);Muscle weakness (generalized) (M62.81);Pain    Time: 7001-7494 PT Time Calculation (min) (ACUTE ONLY): 35 min   Charges:   PT Evaluation $PT Eval Moderate Complexity: [place] Pager [phone] Office [phone]   Claretha Cooper 08/13/2020, 11:27 AM

## 2020-08-13 NOTE — Progress Notes (Signed)
Occupational Therapy Evaluation  Patient lives at home with spouse in a two story home, can stay main level. Very active and independent at baseline, is a Biochemist, clinical. Currently patient limited by acute abdominal pain 2* recent surgery and multiple lines limiting mobility. Patient tolerate sitting up at edge of bed with upper extremity support very well for approx 8 minutes. Needing mod A x2 and instructed in log roll technique for bed mobility. Anticipate patient will progress quickly due to high level PLOF and is motivated to work with therapy. Acute OT to follow, recommend home health at this time and pending progress may D/C with no OT needs.    08/13/20 1200  OT Visit Information  Last OT Received On 08/13/20  Assistance Needed +2  PT/OT/SLP Co-Evaluation/Treatment Yes  Reason for Co-Treatment For patient/therapist safety;To address functional/ADL transfers  PT goals addressed during session Mobility/safety with mobility  OT goals addressed during session ADL's and self-care  History of Present Illness Patient is 60 yo  old male who presented to [facility] 08/12/20 at the direction of PCP with LLQ abdominal pain and induration for the last 2-3 weeks, 20 pound weight loss.  CT-Colon perforation with abscess and fistulization to abdominal wall .  S/P drainage and debridement of left lower quadrant abdominal wall ,. partial colectomy with end colostomy , Splenic mobilization on 08/12/20 emergently.  Precautions  Precautions Fall  Precaution Comments colostomy, mutiple lines  Home Living  Family/patient expects to be discharged to: Private residence  Living Arrangements Spouse/significant other  Available Help at Discharge Family  Type of [place] to enter  Entrance Stairs-Number of Steps 4  Entrance Stairs-[place]ails [place] Two level;Able to live on main level with bedroom/bathroom  Chief Operating Officer None  Prior Function  Level of Independence Independent  Comments tennis instructor  Communication  Communication No difficulties  Pain Assessment  Pain Assessment Faces  Faces Pain Scale 2  Pain Location abdomen  Pain Descriptors / Indicators Discomfort  Pain Intervention(s) Monitored during session  Cognition  Arousal/Alertness Awake/alert  Behavior During Therapy WFL for tasks assessed/performed  Overall Cognitive Status Within Functional Limits for tasks assessed  Upper Extremity Assessment  Upper Extremity Assessment Overall WFL for tasks assessed  Lower Extremity Assessment  Lower Extremity Assessment Defer to PT evaluation  Cervical / Trunk Assessment  Cervical / Trunk Assessment Normal;Other exceptions  Cervical / Trunk Exceptions some guarded when sitting  ADL  Overall ADL's  Needs assistance/impaired  Eating/Feeding NPO  Grooming Set up;Sitting  Upper Body Bathing Minimal assistance;Sitting  Lower Body Bathing Moderate assistance;Sitting/lateral leans;Bed level  Upper Body Dressing  Minimal assistance;Sitting  Lower Body Dressing Maximal assistance;Sitting/lateral leans;Bed level  Toilet Transfer Details (indicate cue type and reason) deferred  Toileting- Clothing Manipulation and Hygiene Total assistance;Bed level  General ADL Comments currently patient limited by acute abdominal pain s/p sx and MULTIPLE lines limiting mobility, anticipate will progress quickly as patient very active at baseline  Bed Mobility  Overal bed mobility Needs Assistance  Bed Mobility Rolling;Sidelying to Sit;Sit to Sidelying  Rolling Mod assist;+2 for physical assistance;+2 for safety/equipment  Sidelying to sit +2 for physical assistance;+2 for safety/equipment;Max assist  Sit to sidelying Mod assist;+2 for safety/equipment;+2 for physical assistance  General bed mobility comments cues for rolling to right, flex knees. Assist to sit from  right side, use of bed pad to  assume  sitting upright.  Assist with  trunk and legs to return to side and to back  Transfers  General transfer comment deferred, RN cleared for EOB only today  Balance  Overall balance assessment Needs assistance  Sitting-balance support Feet unsupported;Bilateral upper extremity supported  Sitting balance-Leahy Scale Poor  Sitting balance - Comments held onto mattress  OT - End of Session  Equipment Utilized During Treatment Oxygen  Activity Tolerance Patient tolerated treatment well  Patient left in bed;with call bell/phone within reach;with nursing/sitter in room  Nurse Communication Mobility status;Other (comment) (IV site leaking)  OT Assessment  OT Recommendation/Assessment Patient needs continued OT Services  OT Visit Diagnosis Other abnormalities of gait and mobility (R26.89);Pain  Pain - part of body  (abdominal)  OT Problem List Pain;Decreased activity tolerance;Impaired balance (sitting and/or standing);Decreased knowledge of precautions  OT Plan  OT Frequency (ACUTE ONLY) Min 2X/week  OT Treatment/Interventions (ACUTE ONLY) Self-care/ADL training;Patient/family education;Balance training;Therapeutic activities;DME and/or AE instruction  AM-PAC OT "6 Clicks" Daily Activity Outcome Measure (Version 2)  Help from another person eating meals? 1 (NPO)  Help from another person taking care of personal grooming? 3  Help from another person toileting, which includes using toliet, bedpan, or urinal? 1  Help from another person bathing (including washing, rinsing, drying)? 2  Help from another person to put on and taking off regular upper body clothing? 3  Help from another person to put on and taking off regular lower body clothing? 2  6 Click Score 12  Progressive Mobility  What is the highest level of mobility based on the progressive mobility assessment? Level 2 (Chairfast) - Balance while sitting on edge of bed and cannot stand  Mobility Sit up in bed/chair position for meals  OT  Recommendation  Follow Up Recommendations Home health OT  OT Equipment Tub/shower seat  Individuals Consulted  Consulted and Agree with Results and Recommendations Patient  Acute Rehab OT Goals  Patient Stated Goal agreed to sitting, return home, tennis  OT Goal Formulation With patient  Time For Goal Achievement 08/27/20  Potential to Achieve Goals Good  OT Time Calculation  OT Start Time (ACUTE ONLY) 0945  OT Stop Time (ACUTE ONLY) 1018  OT Time Calculation (min) 33 min  OT General Charges  $OT Visit 1 Visit  OT Evaluation  $OT Eval Moderate Complexity 1 Mod  Written Expression  Dominant Hand Right   Delbert Phenix OT OT pager: [phone]

## 2020-08-13 NOTE — Progress Notes (Signed)
NAME:  Roger Brennan, MRN:  [id number], DOB:  05-01-1960, LOS: 1 ADMISSION DATE:  08/12/2020, CONSULTATION DATE:  08/12/20 REFERRING MD:  Kinsinger - CCS, CHIEF COMPLAINT:  abdominal pain // colon perforation   History of Present Illness:  History obtained from chart review  60 yo M without significant PMH who presented to [facility] ED 08/12/20 with LLQ abdominal pain at the direction of his PCP. Pain ongoing x 2-3 weeks, associated area of induration. Pain and area of induration have progressively increased. Poor appetite and decreased PO intake during this time. Some history of constipation in June as well an estimated 20lb weight loss over the past 6 weeks per wife.  During ED evaluation, a CT a/p was obtained which revealed a color perforation, abscess formation and fistulization to abdominal wall. HDS in the ED, got 2L LR and broad abx (vanc cefepime flagyl). The patient was taken emergently to the OR for ex lap, debridement  of necrotizing soft tissue infection, partial colectomy with end colostomy.   Following the case, the patient remains will be admitted to the ICU. CCM consulted in this setting. Pertinent  Medical History  Inguinal hernia repair (distant)   Significant Hospital Events:  7/13 ED presentation with worsening abdominal pain. 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 On vanc/cefepime/flagyl pre op.  7/14 no acute issues overnight, serious drainage in ostomy pouch  Interim History / Subjective:  States he feels well with controlled pain and no acute complaints   Objective   Blood pressure (!) 84/57, pulse 85, temperature 97.7 F (36.5 C), temperature source Oral, resp. rate 15, height 5\' 10"  (1.778 m), weight 73.9 kg, SpO2 99 %.    FiO2 (%):  [26 %] 26 %   Intake/Output Summary (Last 24 hours) at 08/13/2020 0706 Last data filed at 08/13/2020 4098 Gross per 24 hour  Intake 8515.44 ml  Output 1675  ml  Net 6840.44 ml    Filed Weights   08/12/20 0958  Weight: 73.9 kg    Examination: General: Pleasant missle aged male lying in bed in NAD   HEENT: [place]/AT MM pink/moist, PERRL Neuro: Alert and oriented x3, non-focal  CV: s1s2 regular rate and rhythm, no murmur, rubs, or gallops,  PULM:  Clear to ascultation bilaterally, no increased work of breathing, no added breath sounds GI: soft, bowel sounds hypoactive in all 4 quadrants, non-distended, NG tube to LIWS, dry midline ABD dressing C/D/I Extremities: warm/dry, no edema  Skin: no rashes or lesions  Resolved Hospital Problem list     Assessment & Plan:   Acute metabolic encephalopathy -anesthetic emergence, infection, aki P Maintain neuro protective measures; goal for eurothermia, euglycemia, eunatermia, normoxia, and PCO2 goal of 35-40 Nutrition and bowel regiment  Seizure precautions  Aspirations precautions  Adequate pain control   Severe sepsis due to colon perforation with abscess and fistula formation, necrotizing ssti of abdominal wall  -s/p ex lap debridement partial colectomy and colostomy 7/13, suspected perf diverticulitis  Lactic acidosis  P: Primary management per CCS Continue empiric zosyn and clindamycin  Follow cultures Remains NPO  NPO  Repeat OR visit per CCS Remains on low dose levo for MAP > 65   AKI, POA  -Creatine on admit 1.45 no labs to compare on admit but creatinine appropriately improved with AKI treatment  P: Follow renal function  Monitor urine output Trend Bmet Avoid nephrotoxins Ensure adequate renal perfusion  IV hydration  Hyponatremia, mild  -  poor po intake - suspect a prerenal injury but possible ATN now with severe sepsis  P: Continue IVF as above  Best Practice   Diet/type: NPO DVT prophylaxis: SCD GI prophylaxis: PPI Lines: Arterial Line Foley:  Yes, and it is still needed Code Status:  full code Last date of multidisciplinary goals of care discussion [per  primary]  Critical care time:   Performed by: Loleta Frommelt D. Harris  Total critical care time: 38 minutes  Critical care time was exclusive of separately billable procedures and treating other patients.  Critical care was necessary to treat or prevent imminent or life-threatening deterioration.  Critical care was time spent personally by me on the following activities: development of treatment plan with patient and/or surrogate as well as nursing, discussions with consultants, evaluation of patient's response to treatment, examination of patient, obtaining history from patient or surrogate, ordering and performing treatments and interventions, ordering and review of laboratory studies, ordering and review of radiographic studies, pulse oximetry and re-evaluation of patient's condition.  Serenity Fortner D. Kenton Kingfisher, NP-C [facility] Personal contact information can be found on Amion  08/13/2020, 7:08 AM

## 2020-08-14 ENCOUNTER — Encounter (HOSPITAL_COMMUNITY): Payer: Self-pay | Admitting: General Surgery

## 2020-08-14 ENCOUNTER — Encounter (HOSPITAL_COMMUNITY): Admission: EM | Disposition: A | Discharge status: Rehabilitation Facility | Payer: Self-pay | Source: Home / Self Care

## 2020-08-14 ENCOUNTER — Inpatient Hospital Stay (HOSPITAL_COMMUNITY): Payer: Managed Care, Other (non HMO) | Admitting: Anesthesiology

## 2020-08-14 ENCOUNTER — Inpatient Hospital Stay: Payer: Self-pay

## 2020-08-14 DIAGNOSIS — R6520 Severe sepsis without septic shock: Secondary | ICD-10-CM | POA: Diagnosis not present

## 2020-08-14 DIAGNOSIS — K631 Perforation of intestine (nontraumatic): Secondary | ICD-10-CM | POA: Diagnosis not present

## 2020-08-14 DIAGNOSIS — A419 Sepsis, unspecified organism: Secondary | ICD-10-CM

## 2020-08-14 HISTORY — PX: LAPAROTOMY: SHX154

## 2020-08-14 LAB — URINE CULTURE: Culture: 5000 — AB

## 2020-08-14 LAB — GLUCOSE, CAPILLARY
Glucose-Capillary: 148 mg/dL — ABNORMAL HIGH (ref 70–99)
Glucose-Capillary: 133 mg/dL — ABNORMAL HIGH (ref 70–99)
Glucose-Capillary: 145 mg/dL — ABNORMAL HIGH (ref 70–99)
Glucose-Capillary: 174 mg/dL — ABNORMAL HIGH (ref 70–99)
Glucose-Capillary: 167 mg/dL — ABNORMAL HIGH (ref 70–99)
Glucose-Capillary: 147 mg/dL — ABNORMAL HIGH (ref 70–99)

## 2020-08-14 LAB — BASIC METABOLIC PANEL
Anion gap: 9 (ref 5–15)
BUN: 32 mg/dL — ABNORMAL HIGH (ref 6–20)
CO2: 20 mmol/L — ABNORMAL LOW (ref 22–32)
Calcium: 7.9 mg/dL — ABNORMAL LOW (ref 8.9–10.3)
Chloride: 103 mmol/L (ref 98–111)
Creatinine, Ser: 1.02 mg/dL (ref 0.61–1.24)
GFR, Estimated: >60 mL/min (ref >60)
Glucose, Bld: 143 mg/dL — ABNORMAL HIGH (ref 70–99)
Potassium: 3.9 mmol/L (ref 3.5–5.1)
Sodium: 132 mmol/L — ABNORMAL LOW (ref 135–145)

## 2020-08-14 LAB — CBC
HCT: 23.1 % — ABNORMAL LOW (ref 39.0–52.0)
Hemoglobin: 7.7 g/dL — ABNORMAL LOW (ref 13.0–17.0)
MCH: 30.6 pg (ref 26.0–34.0)
MCHC: 33.3 g/dL (ref 30.0–36.0)
MCV: 91.7 fL (ref 80.0–100.0)
Platelets: 451 10*3/uL — ABNORMAL HIGH (ref 150–400)
RBC: 2.52 MIL/uL — ABNORMAL LOW (ref 4.22–5.81)
RDW: 13.9 % (ref 11.5–15.5)
WBC: 45.6 10*3/uL — ABNORMAL HIGH (ref 4.0–10.5)
nRBC: 0 % (ref 0.0–0.2)

## 2020-08-14 LAB — HEMOGLOBIN A1C
Hgb A1c MFr Bld: 7.4 % — ABNORMAL HIGH (ref 4.8–5.6)
Mean Plasma Glucose: 165.68 mg/dL

## 2020-08-14 LAB — PREPARE RBC (CROSSMATCH)

## 2020-08-14 LAB — SURGICAL PATHOLOGY

## 2020-08-14 SURGERY — LAPAROTOMY, EXPLORATORY
Anesthesia: General | Site: Abdomen

## 2020-08-14 MED ORDER — INSULIN ASPART 100 UNIT/ML IJ SOLN
0.0000 [IU] | INTRAMUSCULAR | Status: DC
  Administered 2020-08-14 (×2): 3 [IU] via SUBCUTANEOUS
  Administered 2020-08-15 (×5): 2 [IU] via SUBCUTANEOUS
  Administered 2020-08-16 (×2): 3 [IU] via SUBCUTANEOUS
  Administered 2020-08-16 (×2): 5 [IU] via SUBCUTANEOUS
  Administered 2020-08-16 (×2): 8 [IU] via SUBCUTANEOUS
  Administered 2020-08-16: 3 [IU] via SUBCUTANEOUS
  Administered 2020-08-17 (×2): 5 [IU] via SUBCUTANEOUS

## 2020-08-14 MED ORDER — SODIUM CHLORIDE 0.9% IV SOLUTION: Freq: Once | INTRAVENOUS | Status: AC

## 2020-08-14 MED ORDER — DEXAMETHASONE SODIUM PHOSPHATE 10 MG/ML IJ SOLN
INTRAMUSCULAR | Status: DC | PRN
  Administered 2020-08-14: 10 mg via INTRAVENOUS

## 2020-08-14 MED ORDER — FENTANYL CITRATE (PF) 250 MCG/5ML IJ SOLN
INTRAMUSCULAR | Status: AC
  Filled 2020-08-14: qty 5

## 2020-08-14 MED ORDER — PHENYLEPHRINE HCL-NACL 10-0.9 MG/250ML-% IV SOLN
INTRAVENOUS | Status: DC | PRN
  Administered 2020-08-14: 50 ug/min via INTRAVENOUS

## 2020-08-14 MED ORDER — SODIUM CHLORIDE 0.9 % IV SOLN
200.0000 mg | Freq: Once | INTRAVENOUS | Status: AC
  Administered 2020-08-14: 200 mg via INTRAVENOUS
  Filled 2020-08-14: qty 200

## 2020-08-14 MED ORDER — LACTATED RINGERS IV SOLN: INTRAVENOUS | Status: DC | PRN

## 2020-08-14 MED ORDER — PROPOFOL 10 MG/ML IV BOLUS
INTRAVENOUS | Status: DC | PRN
  Administered 2020-08-14: 140 mg via INTRAVENOUS

## 2020-08-14 MED ORDER — ACETAMINOPHEN 10 MG/ML IV SOLN
1000.0000 mg | Freq: Four times a day (QID) | INTRAVENOUS | Status: AC
  Administered 2020-08-14 – 2020-08-15 (×3): 1000 mg via INTRAVENOUS
  Filled 2020-08-14 (×3): qty 100

## 2020-08-14 MED ORDER — LIDOCAINE 2% (20 MG/ML) 5 ML SYRINGE
INTRAMUSCULAR | Status: DC | PRN
  Administered 2020-08-14: 60 mg via INTRAVENOUS

## 2020-08-14 MED ORDER — LACTATED RINGERS IV SOLN: INTRAVENOUS | Status: DC

## 2020-08-14 MED ORDER — ROCURONIUM BROMIDE 10 MG/ML (PF) SYRINGE
PREFILLED_SYRINGE | INTRAVENOUS | Status: DC | PRN
  Administered 2020-08-14: 40 mg via INTRAVENOUS
  Administered 2020-08-14: 10 mg via INTRAVENOUS

## 2020-08-14 MED ORDER — SODIUM CHLORIDE 0.9 % IV SOLN
100.0000 mg | INTRAVENOUS | Status: AC
  Administered 2020-08-15 – 2020-08-20 (×6): 100 mg via INTRAVENOUS
  Filled 2020-08-14 (×6): qty 100

## 2020-08-14 MED ORDER — ONDANSETRON HCL 4 MG/2ML IJ SOLN
INTRAMUSCULAR | Status: DC | PRN
  Administered 2020-08-14: 4 mg via INTRAVENOUS

## 2020-08-14 MED ORDER — SODIUM CHLORIDE 0.9 % IV SOLN
INTRAVENOUS | Status: DC | PRN
  Administered 2020-08-14 – 2020-08-15 (×2): 250 mL via INTRAVENOUS

## 2020-08-14 MED ORDER — ROCURONIUM BROMIDE 10 MG/ML (PF) SYRINGE
PREFILLED_SYRINGE | INTRAVENOUS | Status: AC
  Filled 2020-08-14: qty 10

## 2020-08-14 MED ORDER — SODIUM CHLORIDE 0.9 % IV SOLN: INTRAVENOUS | Status: DC | PRN

## 2020-08-14 MED ORDER — FENTANYL CITRATE (PF) 250 MCG/5ML IJ SOLN
INTRAMUSCULAR | Status: DC | PRN
  Administered 2020-08-14: 75 ug via INTRAVENOUS
  Administered 2020-08-14 (×2): 50 ug via INTRAVENOUS
  Administered 2020-08-14: 25 ug via INTRAVENOUS
  Administered 2020-08-14: 50 ug via INTRAVENOUS

## 2020-08-14 MED ORDER — KETAMINE HCL 10 MG/ML IJ SOLN
INTRAMUSCULAR | Status: DC | PRN
  Administered 2020-08-14: 10 mg via INTRAVENOUS
  Administered 2020-08-14: 30 mg via INTRAVENOUS

## 2020-08-14 MED ORDER — SUGAMMADEX SODIUM 200 MG/2ML IV SOLN
INTRAVENOUS | Status: DC | PRN
  Administered 2020-08-14: 200 mg via INTRAVENOUS

## 2020-08-14 MED ORDER — PROPOFOL 10 MG/ML IV BOLUS
INTRAVENOUS | Status: AC
  Filled 2020-08-14: qty 20

## 2020-08-14 MED ORDER — LIDOCAINE 2% (20 MG/ML) 5 ML SYRINGE
INTRAMUSCULAR | Status: AC
  Filled 2020-08-14: qty 5

## 2020-08-14 MED ORDER — SUCCINYLCHOLINE CHLORIDE 200 MG/10ML IV SOSY
PREFILLED_SYRINGE | INTRAVENOUS | Status: DC | PRN
  Administered 2020-08-14: 120 mg via INTRAVENOUS

## 2020-08-14 MED ORDER — 0.9 % SODIUM CHLORIDE (POUR BTL) OPTIME
TOPICAL | Status: DC | PRN
  Administered 2020-08-14 (×3): 1000 mL

## 2020-08-14 MED ORDER — SODIUM CHLORIDE 0.9 % IV SOLN
INTRAVENOUS | Status: DC | PRN
  Administered 2020-08-14 – 2020-08-17 (×3): 250 mL via INTRAVENOUS

## 2020-08-14 MED ORDER — ONDANSETRON HCL 4 MG/2ML IJ SOLN
INTRAMUSCULAR | Status: AC
  Filled 2020-08-14: qty 2

## 2020-08-14 MED ORDER — PHENYLEPHRINE HCL (PRESSORS) 10 MG/ML IV SOLN
INTRAVENOUS | Status: AC
  Filled 2020-08-14: qty 1

## 2020-08-14 SURGICAL SUPPLY — 50 items
APL PRP STRL LF DISP 70% ISPRP (MISCELLANEOUS)
BAG COUNTER SPONGE SURGICOUNT (BAG) IMPLANT
BAG SPNG CNTER NS LX DISP (BAG)
BNDG GAUZE ELAST 4 BULKY (GAUZE/BANDAGES/DRESSINGS) ×4 IMPLANT
CHLORAPREP W/TINT 26 (MISCELLANEOUS) IMPLANT
COVER MAYO STAND STRL (DRAPES) IMPLANT
COVER SURGICAL LIGHT HANDLE (MISCELLANEOUS) ×2 IMPLANT
DRAIN CHANNEL 19F RND (DRAIN) ×2 IMPLANT
DRAPE LAPAROSCOPIC ABDOMINAL (DRAPES) ×2 IMPLANT
DRAPE UTILITY XL STRL (DRAPES) ×2 IMPLANT
DRAPE WARM FLUID 44X44 (DRAPES) ×2 IMPLANT
DRSG OPSITE POSTOP 4X10 (GAUZE/BANDAGES/DRESSINGS) IMPLANT
DRSG OPSITE POSTOP 4X6 (GAUZE/BANDAGES/DRESSINGS) IMPLANT
DRSG OPSITE POSTOP 4X8 (GAUZE/BANDAGES/DRESSINGS) IMPLANT
ELECT REM PT RETURN 15FT ADLT (MISCELLANEOUS) ×2 IMPLANT
EVACUATOR SILICONE 100CC (DRAIN) ×2 IMPLANT
GLOVE SURG POLYISO LF SZ7 (GLOVE) ×2 IMPLANT
GLOVE SURG UNDER POLY LF SZ7 (GLOVE) ×2 IMPLANT
GOWN STRL REUS W/TWL LRG LVL3 (GOWN DISPOSABLE) ×2 IMPLANT
GOWN STRL REUS W/TWL XL LVL3 (GOWN DISPOSABLE) ×2 IMPLANT
HANDLE SUCTION POOLE (INSTRUMENTS) ×1 IMPLANT
KIT BASIN OR (CUSTOM PROCEDURE TRAY) ×2 IMPLANT
KIT TURNOVER KIT A (KITS) ×2 IMPLANT
LIGASURE IMPACT 36 18CM CVD LR (INSTRUMENTS) ×2 IMPLANT
PACK GENERAL/GYN (CUSTOM PROCEDURE TRAY) ×2 IMPLANT
PENCIL SMOKE EVACUATOR (MISCELLANEOUS) IMPLANT
RELOAD PROXIMATE 100 BLUE (MISCELLANEOUS) IMPLANT
RELOAD PROXIMATE 100MM BLUE (MISCELLANEOUS)
RELOAD PROXIMATE 75MM BLUE (ENDOMECHANICALS) ×4 IMPLANT
SPONGE T-LAP 18X18 ~~LOC~~+RFID (SPONGE) ×2 IMPLANT
STAPLER GUN LINEAR PROX 60 (STAPLE) ×2 IMPLANT
STAPLER PROXIMATE 100MM BLUE (MISCELLANEOUS) IMPLANT
STAPLER PROXIMATE 75MM BLUE (STAPLE) ×2 IMPLANT
STAPLER VISISTAT 35W (STAPLE) ×2 IMPLANT
SUCTION POOLE HANDLE (INSTRUMENTS) ×2
SUT ETHILON 2 0 PS N (SUTURE) ×2 IMPLANT
SUT MNCRL AB 4-0 PS2 18 (SUTURE) IMPLANT
SUT NOVA NAB GS-21 0 18 T12 DT (SUTURE) ×2 IMPLANT
SUT PDS AB 0 CT1 36 (SUTURE) ×4 IMPLANT
SUT SILK 2 0 (SUTURE) ×2
SUT SILK 2 0 SH CR/8 (SUTURE) ×2 IMPLANT
SUT SILK 2-0 18XBRD TIE 12 (SUTURE) ×1 IMPLANT
SUT SILK 3 0 (SUTURE) ×2
SUT SILK 3 0 SH CR/8 (SUTURE) ×6 IMPLANT
SUT SILK 3-0 18XBRD TIE 12 (SUTURE) ×1 IMPLANT
TOWEL OR 17X26 10 PK STRL BLUE (TOWEL DISPOSABLE) ×2 IMPLANT
TOWEL OR NON WOVEN STRL DISP B (DISPOSABLE) ×2 IMPLANT
TRAY FOLEY MTR SLVR 14FR STAT (SET/KITS/TRAYS/PACK) IMPLANT
TRAY FOLEY MTR SLVR 16FR STAT (SET/KITS/TRAYS/PACK) IMPLANT
YANKAUER SUCT BULB TIP NO VENT (SUCTIONS) ×2 IMPLANT

## 2020-08-14 NOTE — Transfer of Care (Signed)
Immediate Anesthesia Transfer of Care Note  Patient: Roger Brennan  Procedure(s) Performed: EXPLORATORY LAPAROTOMY (Abdomen)  Patient Location: PACU  Anesthesia Type:General  Level of Consciousness: awake  Airway & Oxygen Therapy: Patient Spontanous Breathing and Patient connected to face mask oxygen  Post-op Assessment: Report given to RN and Post -op Vital signs reviewed and stable  Post vital signs: Reviewed and stable  Last Vitals:  Vitals Value Taken Time  BP 125/87 08/14/20 1317  Temp    Pulse 109 08/14/20 1318  Resp 15 08/14/20 1318  SpO2 99 % 08/14/20 1318  Vitals shown include unvalidated device data.  Last Pain:  Vitals:   08/14/20 1027  TempSrc:   PainSc: 6       Patients Stated Pain Goal: 3 (44/96/75 9163)  Complications: No notable events documented.

## 2020-08-14 NOTE — Anesthesia Preprocedure Evaluation (Signed)
Anesthesia Evaluation  Patient identified by MRN, date of birth, ID band Patient awake    Reviewed: Allergy & Precautions, NPO status , Patient's Chart, lab work & pertinent test results  History of Anesthesia Complications Negative for: history of anesthetic complications  Airway Mallampati: II  TM Distance: >3 FB Neck ROM: Full    Dental  (+) Dental Advisory Given, Chipped   Pulmonary neg pulmonary ROS,    breath sounds clear to auscultation       Cardiovascular hypertension (no longer requires meds),  Rhythm:Regular Rate:Normal     Neuro/Psych negative neurological ROS     GI/Hepatic Neg liver ROS, Perforated divertic   Endo/Other  negative endocrine ROS  Renal/GU Renal InsufficiencyRenal disease (creat 1.45)     Musculoskeletal negative musculoskeletal ROS (+)   Abdominal   Peds  Hematology  (+) Blood dyscrasia (Hb 9.8), anemia ,   Anesthesia Other Findings   Reproductive/Obstetrics                            Anesthesia Physical Anesthesia Plan  ASA: 2  Anesthesia Plan: General   Post-op Pain Management:    Induction: Intravenous  PONV Risk Score and Plan: 3 and Treatment may vary due to age or medical condition, Ondansetron, Dexamethasone and Midazolam  Airway Management Planned: Oral ETT  Additional Equipment: None  Intra-op Plan:   Post-operative Plan: Extubation in OR  Informed Consent: I have reviewed the patients History and Physical, chart, labs and discussed the procedure including the risks, benefits and alternatives for the proposed anesthesia with the patient or authorized representative who has indicated his/her understanding and acceptance.     Dental advisory given  Plan Discussed with: CRNA and Anesthesiologist  Anesthesia Plan Comments:        Anesthesia Quick Evaluation

## 2020-08-14 NOTE — Progress Notes (Addendum)
Pt requesting to make girlfriend of 30 years, Albin Felling, his POA should he no longer be able to make own decision. Chaplain called to initiate the paperwork process.

## 2020-08-14 NOTE — Progress Notes (Addendum)
Initial Nutrition Assessment  DOCUMENTATION CODES:   Not applicable  INTERVENTION:  - TPN initiation and advancement per Pharmacist. - diet advancement as medically feasible.  Monitor magnesium, potassium, and phosphorus daily for at least 3 days, MD to replete as needed, as pt is at risk for refeeding syndrome given 2-3 weeks without adequate nutrition.    NUTRITION DIAGNOSIS:   Inadequate oral intake related to inability to eat as evidenced by NPO status.  GOAL:   Patient will meet greater than or equal to 90% of their needs  MONITOR:   Diet advancement, Labs, Weight trends, Skin, I & O's, Other (Comment) (TPN regimen)  REASON FOR ASSESSMENT:   Consult New TPN/TNA  ASSESSMENT:   60 year old male with no known medical history. He presented to the ED at the direction of his PCP d/t LLQ abdominal pain and abdominal wound x2-3 weeks. Wound started as a small area, about the size of a silver dollar, and has progressively expanded and worsened. He reported decreased appetite and intermittent fevers over the last several weeks. Last BM was the day PTA. He started having issues with constipation in early June and he made diet adjustments with fiber supplementation. His wife reported 20 lb weight loss in the past 6 weeks.  Patient was discussed in rounds this AM. Patient has been NPO since admission.   Dx of abdominal wall abscess, necrotizing soft tissue of abdominal wall, perforated diverticulitis with colocutaneous fistula.   He is POD #2 I&D LLQ abdominal wall, partial colectomy and end colostomy, and splenic mobilization.   He is POD #0 exploration for concern of leak and inspection of L flank (Op note not yet entered).  Able to talk with patient and family at bedside. Patient reports that he was doing fine PTA and was eating meals TID. Family reports that this is inaccurate and that for the past 2-3 weeks he has been eating very little, often just bites and sips/day. Patient  reports his target weight is 175-178 lb. Wife reports UBW of 180 lb and that he last weighed this for certain 4-6 months ago.   She reports that at home the day PTA he weighed 163 lb. Weight in the ED was documented as 163 lb, which was likely from wife's report. Weight today documented as 185 lb. He is noted to be +10.5 L this admission (this equates to 23 lb).   Discussed plan for PICC placement and TPN initiation with wife at bedside. She would like TPN to start as soon as feasible. Talked with her about time line and let her know several times that TPN will be unable to start until 7/16 evening.   NGT to LIS with ~200 green output in canister at time of RD visit.   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 Medications reviewed; sliding scale novolog. IVF; NS @ 100 ml/hr.    NUTRITION - FOCUSED PHYSICAL EXAM:  Flowsheet Row Most Recent Value  Orbital Region No depletion  Upper Arm Region No depletion  Thoracic and Lumbar Region Unable to assess  Buccal Region No depletion  Temple Region Mild depletion  Clavicle Bone Region Mild depletion  Clavicle and Acromion Bone Region No depletion  Scapular Bone Region Unable to assess  Dorsal Hand No depletion  Patellar Region No depletion  Anterior Thigh Region No depletion  Posterior Calf Region No depletion  Edema (RD Assessment) Mild  [all extremities]  Hair Reviewed  Eyes Reviewed  Mouth Reviewed  Skin Reviewed  Nails Reviewed       Diet Order:   Diet Order             Diet NPO time specified Except for: Ice Chips  Diet effective now                   EDUCATION NEEDS:   Not appropriate for education at this time  Skin:  Skin Assessment: Skin Integrity  Issues: Skin Integrity Issues:: Incisions Incisions: abdomen (7/13 and 7/15)  Last BM:  7/12, per patient report  Height:   Ht Readings from Last 1 Encounters:  08/12/20 5\' 10"  (1.778 m)    Weight:   Wt Readings from Last 1 Encounters:  08/14/20 83.8 kg      Estimated Nutritional Needs:  Kcal:  2500-2700 kcal Protein:  140-155 grams Fluid:  >/= 2.5 L/day     Jarome Matin, MS, RD, LDN, CNSC Inpatient Clinical Dietitian RD pager # available in [place]  After hours/weekend pager # available in [facility]

## 2020-08-14 NOTE — Anesthesia Postprocedure Evaluation (Signed)
Anesthesia Post Note  Patient: Roger Brennan  Procedure(s) Performed: EXPLORATORY LAPAROTOMY (Abdomen)     Patient location during evaluation: SICU Anesthesia Type: General Level of consciousness: awake Pain management: pain level controlled Vital Signs Assessment: post-procedure vital signs reviewed and stable Respiratory status: spontaneous breathing and nonlabored ventilation Cardiovascular status: stable Postop Assessment: no apparent nausea or vomiting Anesthetic complications: no   No notable events documented.  Last Vitals:  Vitals:   08/14/20 1600 08/14/20 1700  BP:    Pulse: 96 97  Resp: 13 11  Temp: 36.5 C   SpO2: 95% 93%    Last Pain:  Vitals:   08/14/20 1600  TempSrc: Oral  PainSc: 7                  Barnet Glasgow

## 2020-08-14 NOTE — Anesthesia Procedure Notes (Addendum)
Procedure Name: Intubation Date/Time: 08/14/2020 11:02 AM Performed by: Sharlette Dense, CRNA Pre-anesthesia Checklist: Patient identified, Emergency Drugs available, Suction available and Patient being monitored Patient Re-evaluated:Patient Re-evaluated prior to induction Oxygen Delivery Method: Circle system utilized Preoxygenation: Pre-oxygenation with 100% oxygen Induction Type: IV induction, Rapid sequence and Cricoid Pressure applied Laryngoscope Size: Miller and 3 Grade View: Grade I Tube type: Oral Tube size: 8.0 mm Number of attempts: 1 Airway Equipment and Method: Stylet and Oral airway Placement Confirmation: ETT inserted through vocal cords under direct vision, positive ETCO2 and breath sounds checked- equal and bilateral Secured at: 23 cm Tube secured with: Tape Dental Injury: Teeth and Oropharynx as per pre-operative assessment

## 2020-08-14 NOTE — Progress Notes (Signed)
OT Cancellation Note  Patient Details Name: Roger Brennan MRN: [id number] DOB: [birth year]   Cancelled Treatment:    Reason Eval/Treat Not Completed: Patient at procedure or test/ unavailable. Patient back to surgery today for I & D and exploration of abdomen. Will f/u as able.  Nyazia Canevari L Vallen Calabrese 08/14/2020, 10:23 AM

## 2020-08-14 NOTE — Progress Notes (Signed)
NAME:  Roger Brennan, MRN:  [id number], DOB:  [birth year], LOS: 2 ADMISSION DATE:  08/12/2020, CONSULTATION DATE:  08/12/20 REFERRING MD:  Kinsinger - CCS, CHIEF COMPLAINT:  abdominal pain // colon perforation   Brief Narrative:  60 yo M presented to [facility] ED 08/12/20 with LLQ abdominal pain CT a/p was obtained which revealed a color perforation, abscess formation and fistulization to abdominal wall. Taken emergently to the OR for ex lap, debridement  of necrotizing soft tissue infection, partial colectomy with end colostomy.   Following the case, the patient remains will be admitted to the ICU. CCM consulted in this setting.  Pertinent  Medical History  Inguinal hernia repair (distant)   Significant Hospital Events:  7/13 ED presentation with worsening abdominal pain. 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 On vanc/cefepime/flagyl pre op.  7/14 no acute issues overnight, serious drainage in ostomy pouch 7/15 No issues overnight, patient remains stable this AM but on surgical evaluation of wound this AM there appears to be development of EC fistula with worsening erythremia in the setting of increased leukocytosis. CCS will take for secondar exploration and debridement today   Interim History / Subjective:  States he feels ok this am but currently undergoing dressing change so he was uncomfortable   Objective   Blood pressure (!) 84/57, pulse 90, temperature 97.9 F (36.6 C), temperature source Axillary, resp. rate 13, height 5\' 10"  (1.778 m), weight 73.9 kg, SpO2 97 %.    FiO2 (%):  [26 %] 26 %   Intake/Output Summary (Last 24 hours) at 08/14/2020 0659 Last data filed at 08/14/2020 0438 Gross per 24 hour  Intake 3260.76 ml  Output 1275 ml  Net 1985.76 ml    Filed Weights   08/12/20 0958  Weight: 73.9 kg    Examination: General: Chronically ill appearing elderly male lying in bed in NAD HEENT: [place]/AT, MM  pink/moist, PERRL  Neuro: Alert and oriented x3, non-focal CV: s1s2 regular rate and rhythm, no murmur, rubs, or gallops,  PULM:  Clear to ascultation bilaterally, no increased work of breathing, no added breath sounds GI: soft, bowel sounds hypoactive in all 4 quadrants, tender, multiple ABD wounds (see CCS pictures), left upper quadrant ostomy, ND tube  Extremities: warm/dry, no edema  Skin: no rashes or lesions  Resolved Hospital Problem list   Acute metabolic encephalopathy Severe sepsis  -Pressors stopped 7/14 Lactic acidosis  AKI, POA  -Creatine on admit 1.45 no labs to compare on admit but creatinine appropriately improved with AKI treatment   Assessment & Plan:   Colon perforation with abscess and fistula formation, necrotizing ssti of abdominal wall  -s/p ex lap debridement partial colectomy and colostomy 7/13, suspected perf diverticulitis  Developed EC fistula POD 2 P: Primary management per CCS Continue empiric Zosyn, vancomycin, and Clindamycin  BID wet to dry dressing changes  Diet advancement per CCS, ice chips currently  Follow cultures PCA for pain control  Hyponatremia, mild  -poor po intake - suspect a prerenal injury but possible ATN now with severe sepsis  P: Continue IVF until able to use GI tract  Mild hyperglycemia P: SSI  Best Practice   Diet/type: NPO DVT prophylaxis: SCD GI prophylaxis: PPI Lines: Arterial Line Foley:  Yes, and it is still needed Code Status:  full code Last date of multidisciplinary goals of care discussion [per primary]  Critical care time: NA  Callan Yontz D. Kenton Kingfisher, NP-C [facility] Personal contact information can be found on Amion  08/14/2020, 6:59 AM

## 2020-08-14 NOTE — H&P (View-Only) (Signed)
Discussed pathology with patient and family (diverticulitis) and plan for reexploration in the morning to evaluate any further necrosis.

## 2020-08-14 NOTE — Progress Notes (Signed)
Assisted Roger Brennan with filling out an advance directive so that his SO, Roger Brennan, could be his health care proxy in case something occurred during surgery.  I provided emotional support to him as well as to Roger Brennan when he left for surgery.  [place], Bcc Pager, [phone] 3:29 PM

## 2020-08-14 NOTE — Progress Notes (Signed)
PHARMACY - TOTAL PARENTERAL NUTRITION CONSULT NOTE   Indication:  intolerance to enteral feeding, s/p  perforated sigmoid diverticulitis with fistula  Assessment: TPN consult received after NOON cutoff so will have to start TPN tomorrow at 1800. Will order baseline labs for AM and place dietary consult for goals  Kara Mead 08/14/2020,1:19 PM

## 2020-08-14 NOTE — Op Note (Signed)
Preoperative diagnosis: intestinal injury  Postoperative diagnosis: same   Procedure: reopening of recent laparotomy, lysis of adhesions for 45 min. Small bowel resection with anastomosis, enterrorhaphy x 2, debridement of 200 cm^2 of necrotic fat and fascia of abdominal wall  Surgeon: Gurney Maxin, M.D.  Asst: Barkley Boards, PA-C  Anesthesia: general  Indications for procedure: Roger Brennan is a 60 y.o. year old male with perforated diverticulitis who was found to have intestinal drainage from the wound and taken urgently for exploration.  Description of procedure: The patient was brought into the operative suite. Anesthesia was administered with General endotracheal anesthesia. WHO checklist was applied. The patient was then placed in supine position. The area was prepped and draped in the usual sterile fashion.  The PDS sutures were removed.  Upon entering the abdomen (organ space), I encountered feculent peritonitis.  The small intestine was inspected. There were multiple loops coming into a large area of inflammation. The adhesive disease was sharply taken down. A 2 cm defect was found in a distal portion of small intestine about 3 ft from the ileocecal valve. After fully dissecting out all adhesions of the small intestine which took 45 min, there were 2 other areas of serosal injury that were repaired with 3-0 silk Lembert sutures. The abdomen was irrigated. A 19 fr blake drain was placed through the RUQ and brought across the abdomen to the left upper quadrant and into the lateral left side. It came lateral of the ostomy.  The abdominal incision was closed with 0 PDS with 0 novafils interrupted as retention sutures.  Next, attention was turned to the subcutaneous wound. There was additional necrotic fat in the left latera wall. The wound was further opened in the left lateral space with additional fascia, fat, and skin. Next, attention was turned to the right side. There was  further fat that was debridement. One incision was made in the lateral right area to assess for unseen necrosis but the fat and fascia were viable. The wound of the left was previously 10 x 15 and expanded to 20 x 23 cm. Debridement was performed with currette, cautery, and sponges. Medially there was additional fat necrosis nearing the ostomy and midline. Fortunately, it did not get to the area of ostomy.   Findings: small intestine injury, additional necrosis of the left side abdominal wall  Specimen: necrotic tissue for culture  Implant: 19 fr blake drain in RUQ coming across and draining the left gutter   Blood loss: 100 ml  Local anesthesia: none  Complications: none  Plan to return to OR in next 48 hours for reexamination of any further necrosis  Gurney Maxin, M.D. General, Bariatric, & Minimally Invasive Surgery [facility]

## 2020-08-14 NOTE — Progress Notes (Signed)
Discussed pathology with patient and family (diverticulitis) and plan for reexploration in the morning to evaluate any further necrosis.

## 2020-08-14 NOTE — TOC Progression Note (Signed)
Transition of Care [facility]) - Progression Note    Patient Details  Name: Roger Brennan MRN: [id number] Date of Birth: 1960/08/10  Transition of [facility]) CM/SW Contact  Giovonnie Trettel, Juliann Pulse, RN Phone Number: 08/14/2020, 9:47 AM  Clinical Narrative: Contacted several [place] agencies-none to accept-[facility]/ [facility];also called insurance company-benefits-there are no [place] agency in network in patient's area for [place]. TC Debra tel#-left vm for call back.MD/Nsg updated-please teach patient ostomy care while in hospital. Can offer otpt PT if needed @ d/c.    Expected Discharge Plan: Home/Self Care Barriers to Discharge: No [place] will accept this patient  Expected Discharge Plan and Services Expected Discharge Plan: Home/Self Care   Discharge Planning Services: CM Consult   Living arrangements for the past 2 months: Single Family Home                                       Social Determinants of Health (SDOH) Interventions    Readmission Risk Interventions No flowsheet data found.

## 2020-08-14 NOTE — Progress Notes (Signed)
Progress Note  2 Days Post-Op  Subjective: RN showed outer dressings saturated with enteric appearing drainage. Packing removed at bedside and patient with enteric appearing drainage in wound. Worsening erythema of left flank. He has pain in wound/incision but otherwise has been doing well with pain control. He is off pressors.   Objective: Vital signs in last 24 hours: Temp:  [97.8 F (36.6 C)-98.1 F (36.7 C)] 97.9 F (36.6 C) (07/15 0355) Pulse Rate:  [85-98] 90 (07/15 0500) Resp:  [9-24] 15 (07/15 0759) SpO2:  [97 %-100 %] 99 % (07/15 0759) Arterial Line BP: (108-149)/(48-81) 132/54 (07/15 0500) FiO2 (%):  [26 %] 26 % (07/15 0759) Last BM Date: 08/11/20  Intake/Output from previous day: 07/14 0701 - 07/15 0700 In: 3260.8 [I.V.:2360.6; IV Piggyback:900.2] Out: 1275 [Urine:1275] Intake/Output this shift: No intake/output data recorded.  PE: General: pleasant, WD, WN malewho is laying in bed in NAD HEENT: head is normocephalic, atraumatic.  Sclera are noninjected.  PERRL.  Ears and nose without any masses or lesions.  Mouth is pink and moist Heart: regular, rate, and rhythm.   Lungs: CTAB, no wheezes, rhonchi, or rales noted.  Respiratory effort nonlabored Abd: midline wound with succus present from apparent ECF at level just below umbilicus, stoma viable with no output, LLQ wound with healthier appearing tissue and minimal drainage from previous [facility] site, worsening erythema and induration of L flank       MS: all 4 extremities are symmetrical with no cyanosis, clubbing, or edema. Skin: warm and dry with no masses, lesions, or rashes Neuro: Cranial nerves 2-12 grossly intact, sensation is normal throughout Psych: A&Ox3 with an appropriate affect.    Lab Results:  Recent Labs    08/13/20 1233 08/14/20 0440  WBC 37.2* 45.6*  HGB 7.8* 7.7*  HCT 23.0* 23.1*  PLT 421* 451*   BMET Recent Labs    08/13/20 1233 08/14/20 0440  NA 132* 132*  K 4.2 3.9  CL 103  103  CO2 20* 20*  GLUCOSE 185* 143*  BUN 32* 32*  CREATININE 1.20 1.02  CALCIUM 7.7* 7.9*   PT/INR Recent Labs    08/12/20 1010  LABPROT 15.1  INR 1.2   CMP     Component Value Date/Time   NA 132 (L) 08/14/2020 0440   K 3.9 08/14/2020 0440   CL 103 08/14/2020 0440   CO2 20 (L) 08/14/2020 0440   GLUCOSE 143 (H) 08/14/2020 0440   BUN 32 (H) 08/14/2020 0440   CREATININE 1.02 08/14/2020 0440   CALCIUM 7.9 (L) 08/14/2020 0440   PROT 4.7 (L) 08/13/2020 0234   ALBUMIN 1.9 (L) 08/13/2020 0234   AST 17 08/13/2020 0234   ALT 18 08/13/2020 0234   ALKPHOS 81 08/13/2020 0234   BILITOT 1.5 (H) 08/13/2020 0234   GFRNONAA >60 08/14/2020 0440   Lipase     Component Value Date/Time   LIPASE 18 08/12/2020 1010       Studies/Results: CT ABDOMEN PELVIS W CONTRAST  Result Date: 08/12/2020 CLINICAL DATA:  Abdominal pain, fever EXAM: CT ABDOMEN AND PELVIS WITH CONTRAST TECHNIQUE: Multidetector CT imaging of the abdomen and pelvis was performed using the standard protocol following bolus administration of intravenous contrast. CONTRAST:  61mL OMNIPAQUE IOHEXOL 350 MG/ML SOLN COMPARISON:  None. FINDINGS: Lower chest: Lung bases are clear. No effusions. Heart is normal size. Hepatobiliary: No focal hepatic abnormality. Gallbladder unremarkable. Pancreas: No focal abnormality or ductal dilatation. Spleen: No focal abnormality.  Normal size. Adrenals/Urinary Tract: No adrenal  abnormality. No focal renal abnormality. No stones or hydronephrosis. Urinary bladder is unremarkable. Stomach/Bowel: There is sigmoid diverticulosis. Wall thickening noted within the sigmoid colon with surrounding inflammation and extraluminal gas most compatible with diverticulitis although perforated neoplasm cannot be completely excluded. Stomach and small bowel decompressed. Vascular/Lymphatic: No evidence of aneurysm or adenopathy. Reproductive: No visible focal abnormality. Other: Large complex gas and fluid collection  noted throughout the left anterior abdominal wall and left lower abdominal wall musculature. This is concerning for possible fistulous communication to the intra-abdominal cavity related to inflammatory process in the left lower quadrant. Fluid and gas collection in the left lower abdominal wall measures 11.5 x 5.1 cm on image 71 of series 2. On coronal imaging, this extends over approximately 18 cm craniocaudal length. Musculoskeletal: No acute bony abnormality. IMPRESSION: Inflammatory process in the left lower quadrant consisting of sigmoid diverticulosis with wall thickening, surrounding inflammation and extraluminal gas as well as large fluid and gas collection extending into the left abdominal wall musculature and subcutaneous soft tissues in the left lower abdomen and pelvis. Findings concerning for perforated diverticulitis with associated fistulous connection to the abdominal wall. Perforated neoplasm cannot be completely excluded. These results were called by telephone at the time of interpretation on 08/12/2020 at 11:54 am to provider [facility] , who verbally acknowledged these results. Electronically Signed   By: Rolm Baptise M.D.   On: 08/12/2020 11:58   DG Chest Port 1 View  Result Date: 08/12/2020 CLINICAL DATA:  Questionable sepsis.  Left abdominal pain. EXAM: PORTABLE CHEST 1 VIEW COMPARISON:  None. FINDINGS: Heart and mediastinal contours are within normal limits. No focal opacities or effusions. No acute bony abnormality. IMPRESSION: No active disease. Electronically Signed   By: Rolm Baptise M.D.   On: 08/12/2020 10:30   DG Abd Portable 1V  Result Date: 08/12/2020 CLINICAL DATA:  Encounter for feeding tube placement. EXAM: PORTABLE ABDOMEN - 1 VIEW COMPARISON:  CT abdomen and pelvis of the same day. FINDINGS: Mild dilation of the colon again noted. Gastric tube in place. The side port is above the GE junction. Extra-abdominal gas noted on CT not well visualized by this radiograph.  IMPRESSION: 1. Gastric tube in place, with the side port above the GE junction. 2. Persistent mild dilation of the colon. Electronically Signed   By: San Morelle M.D.   On: 08/12/2020 19:10    Anti-infectives: Anti-infectives (From admission, onward)    Start     Dose/Rate Route Frequency Ordered Stop   08/14/20 1000  vancomycin (VANCOREADY) IVPB 1500 mg/300 mL        1,500 mg 150 mL/hr over 120 Minutes Intravenous Every 24 hours 08/13/20 1009     08/13/20 1000  vancomycin (VANCOREADY) IVPB 1250 mg/250 mL  Status:  Discontinued        1,250 mg 166.7 mL/hr over 90 Minutes Intravenous Every 24 hours 08/12/20 1910 08/13/20 1009   08/12/20 2200  piperacillin-tazobactam (ZOSYN) IVPB 3.375 g  Status:  Discontinued        3.375 g 12.5 mL/hr over 240 Minutes Intravenous Every 8 hours 08/12/20 1904 08/12/20 1941   08/12/20 2000  clindamycin (CLEOCIN) IVPB 600 mg        600 mg 100 mL/hr over 30 Minutes Intravenous Every 8 hours 08/12/20 1757     08/12/20 1830  piperacillin-tazobactam (ZOSYN) IVPB 3.375 g        3.375 g 12.5 mL/hr over 240 Minutes Intravenous Every 8 hours 08/12/20 1811  08/12/20 1015  vancomycin (VANCOREADY) IVPB 1500 mg/300 mL        1,500 mg 150 mL/hr over 120 Minutes Intravenous  Once 08/12/20 1007 08/12/20 1330   08/12/20 1000  ceFEPIme (MAXIPIME) 2 g in sodium chloride 0.9 % 100 mL IVPB        2 g 200 mL/hr over 30 Minutes Intravenous  Once 08/12/20 1000 08/12/20 1121   08/12/20 1000  metroNIDAZOLE (FLAGYL) IVPB 500 mg        500 mg 100 mL/hr over 60 Minutes Intravenous  Once 08/12/20 1000 08/12/20 1226   08/12/20 1000  vancomycin (VANCOCIN) IVPB 1000 mg/200 mL premix  Status:  Discontinued        1,000 mg 200 mL/hr over 60 Minutes Intravenous  Once 08/12/20 1000 08/12/20 1007        Assessment/Plan Perforated sigmoid diverticulitis with colocutaneous fistula and NSTI of abdominal wall POD2 S/P drainage and debridement of abdominal wall, open partial  colectomy with end colostomy 08/12/20 Dr. Kieth Brightly - now with apparent EC fistula to midline wound - worsening erythema and induration of L flank - WBC 45 - plan return to OR for exploration/debridement of abdominal wall and re-exploration of abdomen  - I called and updated his wife by phone  Septic shock - off pressors but returning to OR today for re-exploration  AKI - Cr 1.0, improving ABL anemia - hgb 7.7, stable   FEN: NPO, NGT to LIWS, IVF @100  cc/h VTE: SCDs, hold LMWH for return to OR ID: zosyn/vanc/clinda 7/13>> Foley: continue   LOS: 2 days    Norm Parcel, [facility] 08/14/2020, 8:00 AM Please see Amion for pager number during day hours 7:00am-4:30pm

## 2020-08-15 ENCOUNTER — Inpatient Hospital Stay (HOSPITAL_COMMUNITY): Payer: Managed Care, Other (non HMO) | Admitting: Anesthesiology

## 2020-08-15 ENCOUNTER — Encounter (HOSPITAL_COMMUNITY): Payer: Self-pay | Admitting: General Surgery

## 2020-08-15 ENCOUNTER — Encounter (HOSPITAL_COMMUNITY): Admission: EM | Disposition: A | Discharge status: Rehabilitation Facility | Payer: Self-pay | Source: Home / Self Care

## 2020-08-15 ENCOUNTER — Inpatient Hospital Stay: Payer: Self-pay

## 2020-08-15 DIAGNOSIS — K658 Other peritonitis: Secondary | ICD-10-CM | POA: Diagnosis not present

## 2020-08-15 DIAGNOSIS — K631 Perforation of intestine (nontraumatic): Secondary | ICD-10-CM | POA: Diagnosis not present

## 2020-08-15 HISTORY — PX: ABDOMINAL WALL DEFECT REPAIR: SHX53

## 2020-08-15 LAB — GLUCOSE, CAPILLARY
Glucose-Capillary: 124 mg/dL — ABNORMAL HIGH (ref 70–99)
Glucose-Capillary: 125 mg/dL — ABNORMAL HIGH (ref 70–99)
Glucose-Capillary: 132 mg/dL — ABNORMAL HIGH (ref 70–99)
Glucose-Capillary: 137 mg/dL — ABNORMAL HIGH (ref 70–99)
Glucose-Capillary: 124 mg/dL — ABNORMAL HIGH (ref 70–99)
Glucose-Capillary: 182 mg/dL — ABNORMAL HIGH (ref 70–99)

## 2020-08-15 LAB — CBC
HCT: 29.8 % — ABNORMAL LOW (ref 39.0–52.0)
Hemoglobin: 9.9 g/dL — ABNORMAL LOW (ref 13.0–17.0)
MCH: 30.6 pg (ref 26.0–34.0)
MCHC: 33.2 g/dL (ref 30.0–36.0)
MCV: 92 fL (ref 80.0–100.0)
Platelets: 453 10*3/uL — ABNORMAL HIGH (ref 150–400)
RBC: 3.24 MIL/uL — ABNORMAL LOW (ref 4.22–5.81)
RDW: 14.4 % (ref 11.5–15.5)
WBC: 39.5 10*3/uL — ABNORMAL HIGH (ref 4.0–10.5)
nRBC: 0 % (ref 0.0–0.2)

## 2020-08-15 LAB — COMPREHENSIVE METABOLIC PANEL
ALT: 18 U/L (ref 0–44)
AST: 15 U/L (ref 15–41)
Albumin: 1.7 g/dL — ABNORMAL LOW (ref 3.5–5.0)
Alkaline Phosphatase: 89 U/L (ref 38–126)
Anion gap: 8 (ref 5–15)
BUN: 32 mg/dL — ABNORMAL HIGH (ref 6–20)
CO2: 20 mmol/L — ABNORMAL LOW (ref 22–32)
Calcium: 7.8 mg/dL — ABNORMAL LOW (ref 8.9–10.3)
Chloride: 108 mmol/L (ref 98–111)
Creatinine, Ser: 0.94 mg/dL (ref 0.61–1.24)
GFR, Estimated: >60 mL/min (ref >60)
Glucose, Bld: 162 mg/dL — ABNORMAL HIGH (ref 70–99)
Potassium: 4.4 mmol/L (ref 3.5–5.1)
Sodium: 136 mmol/L (ref 135–145)
Total Bilirubin: 1.2 mg/dL (ref 0.3–1.2)
Total Protein: 4.8 g/dL — ABNORMAL LOW (ref 6.5–8.1)

## 2020-08-15 LAB — TRIGLYCERIDES: Triglycerides: 71 mg/dL (ref <150)

## 2020-08-15 LAB — DIFFERENTIAL
Abs Immature Granulocytes: 1 10*3/uL — ABNORMAL HIGH (ref 0.00–0.07)
Basophils Absolute: 0.1 10*3/uL (ref 0.0–0.1)
Basophils Relative: 0 %
Eosinophils Absolute: 0 10*3/uL (ref 0.0–0.5)
Eosinophils Relative: 0 %
Immature Granulocytes: 3 %
Lymphocytes Relative: 4 %
Lymphs Abs: 1.4 10*3/uL (ref 0.7–4.0)
Monocytes Absolute: 1.4 10*3/uL — ABNORMAL HIGH (ref 0.1–1.0)
Monocytes Relative: 4 %
Neutro Abs: 35.7 10*3/uL — ABNORMAL HIGH (ref 1.7–7.7)
Neutrophils Relative %: 89 %

## 2020-08-15 LAB — CEA: CEA: 0.9 ng/mL (ref 0.0–4.7)

## 2020-08-15 LAB — MAGNESIUM: Magnesium: 2.3 mg/dL (ref 1.7–2.4)

## 2020-08-15 LAB — PREALBUMIN: Prealbumin: <5 mg/dL — ABNORMAL LOW (ref 18–38)

## 2020-08-15 LAB — PHOSPHORUS: Phosphorus: 5.7 mg/dL — ABNORMAL HIGH (ref 2.5–4.6)

## 2020-08-15 SURGERY — REPAIR, ABDOMINAL WALL
Anesthesia: General | Site: Abdomen

## 2020-08-15 SURGERY — Surgical Case
Anesthesia: *Unknown

## 2020-08-15 MED ORDER — MIDAZOLAM HCL 2 MG/2ML IJ SOLN
INTRAMUSCULAR | Status: AC
  Filled 2020-08-15: qty 2

## 2020-08-15 MED ORDER — PROPOFOL 10 MG/ML IV BOLUS
INTRAVENOUS | Status: AC
  Filled 2020-08-15: qty 20

## 2020-08-15 MED ORDER — PROPOFOL 10 MG/ML IV BOLUS
INTRAVENOUS | Status: DC | PRN
  Administered 2020-08-15: 150 mg via INTRAVENOUS

## 2020-08-15 MED ORDER — SUCCINYLCHOLINE CHLORIDE 200 MG/10ML IV SOSY
PREFILLED_SYRINGE | INTRAVENOUS | Status: DC | PRN
  Administered 2020-08-15: 160 mg via INTRAVENOUS

## 2020-08-15 MED ORDER — FENTANYL CITRATE (PF) 100 MCG/2ML IJ SOLN
INTRAMUSCULAR | Status: AC
  Filled 2020-08-15: qty 2

## 2020-08-15 MED ORDER — DEXAMETHASONE SODIUM PHOSPHATE 10 MG/ML IJ SOLN
INTRAMUSCULAR | Status: AC
  Filled 2020-08-15: qty 1

## 2020-08-15 MED ORDER — HYDROMORPHONE HCL 1 MG/ML IJ SOLN
INTRAMUSCULAR | Status: DC | PRN
  Administered 2020-08-15: .8 mg via INTRAVENOUS

## 2020-08-15 MED ORDER — LIDOCAINE 2% (20 MG/ML) 5 ML SYRINGE
INTRAMUSCULAR | Status: AC
  Filled 2020-08-15: qty 5

## 2020-08-15 MED ORDER — SODIUM CHLORIDE 0.9% FLUSH
10.0000 mL | INTRAVENOUS | Status: DC | PRN
  Administered 2020-08-17: 10 mL

## 2020-08-15 MED ORDER — TRAVASOL 10 % IV SOLN: INTRAVENOUS | Status: DC

## 2020-08-15 MED ORDER — HYDROMORPHONE HCL 2 MG/ML IJ SOLN
INTRAMUSCULAR | Status: AC
  Filled 2020-08-15: qty 1

## 2020-08-15 MED ORDER — FENTANYL CITRATE (PF) 100 MCG/2ML IJ SOLN: 25.0000 ug | INTRAMUSCULAR | Status: DC | PRN

## 2020-08-15 MED ORDER — LACTATED RINGERS IV SOLN: INTRAVENOUS | Status: DC | PRN

## 2020-08-15 MED ORDER — VASOPRESSIN 20 UNIT/ML IV SOLN
INTRAVENOUS | Status: AC
  Filled 2020-08-15: qty 1

## 2020-08-15 MED ORDER — ROCURONIUM BROMIDE 10 MG/ML (PF) SYRINGE
PREFILLED_SYRINGE | INTRAVENOUS | Status: DC | PRN
  Administered 2020-08-15: 10 mg via INTRAVENOUS

## 2020-08-15 MED ORDER — TRAVASOL 10 % IV SOLN
INTRAVENOUS | Status: AC
  Filled 2020-08-15: qty 504

## 2020-08-15 MED ORDER — FENTANYL CITRATE (PF) 100 MCG/2ML IJ SOLN
INTRAMUSCULAR | Status: DC | PRN
  Administered 2020-08-15 (×2): 50 ug via INTRAVENOUS

## 2020-08-15 MED ORDER — LIDOCAINE 2% (20 MG/ML) 5 ML SYRINGE
INTRAMUSCULAR | Status: DC | PRN
  Administered 2020-08-15: 60 mg via INTRAVENOUS

## 2020-08-15 MED ORDER — SODIUM CHLORIDE 0.9 % IR SOLN
Status: DC | PRN
  Administered 2020-08-15: 3000 mL

## 2020-08-15 MED ORDER — PHENYLEPHRINE 40 MCG/ML (10ML) SYRINGE FOR IV PUSH (FOR BLOOD PRESSURE SUPPORT)
PREFILLED_SYRINGE | INTRAVENOUS | Status: DC | PRN
  Administered 2020-08-15: 200 ug via INTRAVENOUS

## 2020-08-15 MED ORDER — ONDANSETRON HCL 4 MG/2ML IJ SOLN
INTRAMUSCULAR | Status: AC
  Filled 2020-08-15: qty 2

## 2020-08-15 MED ORDER — ONDANSETRON HCL 4 MG/2ML IJ SOLN
INTRAMUSCULAR | Status: DC | PRN
  Administered 2020-08-15: 4 mg via INTRAVENOUS

## 2020-08-15 MED ORDER — SODIUM CHLORIDE 0.9% FLUSH
10.0000 mL | Freq: Two times a day (BID) | INTRAVENOUS | Status: DC
  Administered 2020-08-15 – 2020-08-17 (×5): 10 mL
  Administered 2020-08-18: 20 mL
  Administered 2020-08-18 – 2020-08-23 (×10): 10 mL
  Administered 2020-08-23: 30 mL
  Administered 2020-08-24 – 2020-09-01 (×12): 10 mL

## 2020-08-15 SURGICAL SUPPLY — 49 items
APL SWBSTK 6 STRL LF DISP (MISCELLANEOUS) ×2
APPLICATOR COTTON TIP 6 STRL (MISCELLANEOUS) ×2 IMPLANT
APPLICATOR COTTON TIP 6IN STRL (MISCELLANEOUS) ×4 IMPLANT
BAG COUNTER SPONGE SURGICOUNT (BAG) IMPLANT
BAG SPNG CNTER NS LX DISP (BAG)
BLADE EXTENDED COATED 6.5IN (ELECTRODE) IMPLANT
BLADE HEX COATED 2.75 (ELECTRODE) ×2 IMPLANT
BLADE SURG SZ10 CARB STEEL (BLADE) ×2 IMPLANT
BNDG GAUZE ELAST 4 BULKY (GAUZE/BANDAGES/DRESSINGS) ×1 IMPLANT
CLIP VESOCCLUDE LG 6/CT (CLIP) IMPLANT
COVER MAYO STAND STRL (DRAPES) ×2 IMPLANT
DRAPE LAPAROSCOPIC ABDOMINAL (DRAPES) ×2 IMPLANT
DRAPE SHEET LG 3/4 BI-LAMINATE (DRAPES) IMPLANT
DRSG PAD ABDOMINAL 8X10 ST (GAUZE/BANDAGES/DRESSINGS) ×2 IMPLANT
ELECT REM PT RETURN 15FT ADLT (MISCELLANEOUS) ×2 IMPLANT
GAUZE SPONGE 4X4 12PLY STRL (GAUZE/BANDAGES/DRESSINGS) ×2 IMPLANT
GLOVE SURG MICRO LTX SZ7.5 (GLOVE) ×2 IMPLANT
GLOVE SURG UNDER LTX SZ8 (GLOVE) ×4 IMPLANT
GLOVE SURG UNDER POLY LF SZ7 (GLOVE) ×2 IMPLANT
GOWN STRL REUS W/TWL LRG LVL3 (GOWN DISPOSABLE) ×2 IMPLANT
GOWN STRL REUS W/TWL XL LVL3 (GOWN DISPOSABLE) ×4 IMPLANT
HANDLE SUCTION POOLE (INSTRUMENTS) ×1 IMPLANT
IRRIG SUCT STRYKERFLOW 2 WTIP (MISCELLANEOUS) ×2
IRRIGATION SUCT STRKRFLW 2 WTP (MISCELLANEOUS) IMPLANT
KIT BASIN OR (CUSTOM PROCEDURE TRAY) ×2 IMPLANT
KIT TURNOVER KIT A (KITS) ×2 IMPLANT
NS IRRIG 1000ML POUR BTL (IV SOLUTION) ×4 IMPLANT
PACK GENERAL/GYN (CUSTOM PROCEDURE TRAY) ×2 IMPLANT
PENCIL SMOKE EVACUATOR (MISCELLANEOUS) IMPLANT
SHEARS HARMONIC ACE PLUS 36CM (ENDOMECHANICALS) IMPLANT
SPONGE T-LAP 18X18 ~~LOC~~+RFID (SPONGE) IMPLANT
STAPLER VISISTAT 35W (STAPLE) ×2 IMPLANT
SUCTION POOLE HANDLE (INSTRUMENTS) ×2
SUT NOV 1 T60/GS (SUTURE) IMPLANT
SUT NOVA NAB DX-16 0-1 5-0 T12 (SUTURE) IMPLANT
SUT NOVA T20/GS 25 (SUTURE) ×4 IMPLANT
SUT PDS AB 1 CTX 36 (SUTURE) ×4 IMPLANT
SUT PROLENE 2 0 KS (SUTURE) IMPLANT
SUT SILK 2 0 (SUTURE)
SUT SILK 2 0 SH CR/8 (SUTURE) IMPLANT
SUT SILK 2 0SH CR/8 30 (SUTURE) IMPLANT
SUT SILK 2-0 18XBRD TIE 12 (SUTURE) IMPLANT
SUT SILK 2-0 30XBRD TIE 12 (SUTURE) IMPLANT
SUT SILK 3 0 (SUTURE) ×2
SUT SILK 3 0 SH CR/8 (SUTURE) IMPLANT
SUT SILK 3-0 18XBRD TIE 12 (SUTURE) ×1 IMPLANT
TOWEL OR 17X26 10 PK STRL BLUE (TOWEL DISPOSABLE) ×4 IMPLANT
TRAY FOLEY MTR SLVR 16FR STAT (SET/KITS/TRAYS/PACK) ×2 IMPLANT
YANKAUER SUCT BULB TIP NO VENT (SUCTIONS) ×2 IMPLANT

## 2020-08-15 NOTE — Anesthesia Preprocedure Evaluation (Addendum)
Anesthesia Evaluation  Patient identified by MRN, date of birth, ID band Patient awake    Reviewed: Allergy & Precautions, NPO status , Patient's Chart, lab work & pertinent test results  Airway Mallampati: III  TM Distance: >3 FB Neck ROM: Full    Dental  (+) Missing, Dental Advisory Given,    Pulmonary neg pulmonary ROS,    Pulmonary exam normal breath sounds clear to auscultation       Cardiovascular negative cardio ROS Normal cardiovascular exam Rhythm:Regular Rate:Normal     Neuro/Psych negative neurological ROS  negative psych ROS   GI/Hepatic negative GI ROS, Neg liver ROS,   Endo/Other  negative endocrine ROS  Renal/GU negative Renal ROS  negative genitourinary   Musculoskeletal negative musculoskeletal ROS (+)   Abdominal   Peds  Hematology  (+) Blood dyscrasia (Hgb 9.9), anemia ,   Anesthesia Other Findings Presented to ED on 7/13 with abdominal pain found to have a colon perforation with abscess and fistulization to abdominal wall now s/p exploratory laparotomy with partial colectomy and colostomy, abdominal wall debridement. Back to OR on 7/15 for recurrent peritonitis, LOA ,Small bowel resection with anastomosis. Now presents for washout.   Reproductive/Obstetrics                            Anesthesia Physical Anesthesia Plan  ASA: 2  Anesthesia Plan: General   Post-op Pain Management:    Induction: Intravenous  PONV Risk Score and Plan: 2 and Midazolam, Dexamethasone and Ondansetron  Airway Management Planned: Oral ETT  Additional Equipment:   Intra-op Plan:   Post-operative Plan: Extubation in OR  Informed Consent: I have reviewed the patients History and Physical, chart, labs and discussed the procedure including the risks, benefits and alternatives for the proposed anesthesia with the patient or authorized representative who has indicated his/her understanding  and acceptance.     Dental advisory given  Plan Discussed with: CRNA  Anesthesia Plan Comments:         Anesthesia Quick Evaluation

## 2020-08-15 NOTE — Anesthesia Procedure Notes (Signed)
Procedure Name: Intubation Date/Time: 08/15/2020 11:26 AM Performed by: Sharlette Dense, CRNA Patient Re-evaluated:Patient Re-evaluated prior to induction Oxygen Delivery Method: Circle system utilized Preoxygenation: Pre-oxygenation with 100% oxygen Induction Type: IV induction and Rapid sequence Laryngoscope Size: Mac Grade View: Grade I Tube type: Oral Tube size: 7.5 mm Number of attempts: 1 Airway Equipment and Method: Stylet Placement Confirmation: ETT inserted through vocal cords under direct vision, positive ETCO2 and breath sounds checked- equal and bilateral Secured at: 22 cm Tube secured with: Tape Dental Injury: Teeth and Oropharynx as per pre-operative assessment

## 2020-08-15 NOTE — Op Note (Signed)
Preoperative diagnosis: Open abdominal wall wounds left flank, right lower quadrant total area 738 cm ^2  The area left lower quadrants was 20 cm x 30 cm and midline 20 cm x 6 cm and right lower quadrant 6 x 3 cm  Postoperative diagnosis: Same  Procedure: Debridement of abdominal wall with above dimensions with sharp dissection and Pulsavac of wounds  Surgeon: Erroll Luna, MD  Anesthesia: General  EBL: Minimal  Specimen: None  Indications for procedure: The patient is a 60 year old male underwent laparotomy for diverticulitis 2 days ago.  He had extensive subcutaneous infection from this involving the skin, subcutaneous fat down to the musculature of the abdominal wall involving the left lower quadrant.  All of the skin was dead was debrided left open.  Fortunately, the fascia was intact.  He returns today for second look procedure for any further debridement of skin annulus muscle and washout.  Risks and benefits were discussed with the patient holding area.  Questions were answered.The procedure has been discussed with the patient.  Alternative therapies have been discussed with the patient.  Operative risks include bleeding,  Infection,  Organ injury,  Nerve injury,  Blood vessel injury,  DVT,  Pulmonary embolism,  Death,  And possible reoperation.  Medical management risks include worsening of present situation.  The success of the procedure is 50 -90 % at treating patients symptoms.  The patient understands and agrees to proceed.    Description of procedure: The patient was met in the holding area and questions were answered.  He was taken back to the operating placed supine where general anesthesia was initiated.  His wounds were unpacked.  Of note he had a midline colostomy.  There was good granulation tissue throughout most wounds.  After sterile prep and drape, timeout performed.  I then used the Pulsavac to pulse out the left lower quadrant open wound, midline wound and then there  is a small right lower quadrant wound and washed out.  Sharp debridement with scalpel was used for any did skin and/or fascia which was quite minimal.  Additional 60 cm debrided.  This was skin and subcutaneous fat.  After ensuring hemostasis the wounds were packed open with Kerlix dressings.  Dry dressings applied.  All counts were found to be correct.  The patient was awoke extubated taken recovery in satisfactory condition.                                    Excisional debridement:  1.  Progress note or procedure note with a detailed description of the procedure.  2.  Tool used for debridement (curette, scapel, etc.) scalpel, Pulsavac  3.  Frequency of surgical debridement.   Once  4.  Measurement of total devitalized tissue (wound surface) before and after surgical debridement.   Approximately 60 cm  5.  Area and depth of devitalized tissue removed from wound.  60 cm  6.  Blood loss and description of tissue removed.  20 cc necrotic  7.  Evidence of the progress of the wound's response to treatment.  A.  Current wound volume (current dimensions and depth).  780 cc  B.  Presence (and extent of) of infection.  Minimal  C.  Presence (and extent of) of non viable tissue.  Less than 5%  D.  Other material in the wound that is expected to inhibit healing.  None  8.  Was there any  viable tissue removed (measurements): None

## 2020-08-15 NOTE — Progress Notes (Signed)
NAME:  Roger Brennan, MRN:  [id number], DOB:  01/12/1961, LOS: 3 ADMISSION DATE:  08/12/2020, CONSULTATION DATE:  08/12/20 REFERRING MD:  Kinsinger - CCS, CHIEF COMPLAINT:  abdominal pain // colon perforation   Brief Narrative:  60 yo M presented to [facility] ED 08/12/20 with LLQ abdominal pain CT a/p was obtained which revealed a color perforation, abscess formation and fistulization to abdominal wall. Taken emergently to the OR for ex lap, debridement  of necrotizing soft tissue infection, partial colectomy with end colostomy.   Following the case, the patient remains will be admitted to the ICU. CCM consulted in this setting.  Pertinent  Medical History  Inguinal hernia repair (distant)   Significant Hospital Events:  7/13 ED presentation with worsening abdominal pain. 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 On vanc/cefepime/flagyl pre op.  7/15 intestinal drainage from the wound >> emergent exploration >> recurrent peritonitis, LOA ,Small bowel resection with anastomosis, enterrorhaphy x 2, debridement of 200 cm^2 of necrotic fat and fascia of abdominal wall  Interim History / Subjective:   Remains critically ill, on PCA  Pain is controlled Urine output adequate Afebrile   Objective   Blood pressure 107/79, pulse 77, temperature 97.6 F (36.4 C), temperature source Oral, resp. rate (!) 8, height 5\' 10"  (1.778 m), weight 86.1 kg, SpO2 92 %.    FiO2 (%):  [21 %-26 %] 21 %   Intake/Output Summary (Last 24 hours) at 08/15/2020 0840 Last data filed at 08/15/2020 0600 Gross per 24 hour  Intake 5045.34 ml  Output 3030 ml  Net 2015.34 ml    Filed Weights   08/12/20 0958 08/14/20 1420 08/15/20 0445  Weight: 73.9 kg 83.8 kg 86.1 kg    Examination: General: Acutely ill appearing elderly male lying in bed in NAD HEENT: [place]/AT, MM pink/moist, PERRL  Neuro: Alert and oriented x3, non-focal CV: s1s2 regular rate  and rhythm, no murmur, rubs, or gallops,  PULM: No accessory muscle use, clear breath sounds bilateral GI: soft, bowel sounds hypoactive in all 4 quadrants, tender, multiple ABD -wounds covered with bandage, JP drain with serosanguineous Extremities: warm/dry, no edema  Skin: no rashes or lesions  Labs show normal electrolytes, albumin down to 1.7, persistent leukocytosis, stable anemia  Resolved Hospital Problem list   Acute metabolic encephalopathy Severe sepsis  -Pressors stopped 7/14 Lactic acidosis  AKI, POA   Hyponatremia, mild   Assessment & Plan:   Feculent peritonitis Colon perforation with abscess and fistula formation, necrotizing ssti of abdominal wall  -s/p ex lap debridement partial colectomy and colostomy 7/13, suspected perf diverticulitis  Developed EC fistula POD 2 P: Primary management per CCS , repeat exploration/washout planned for today Continue empiric Zosyn and Clindamycin  Eraxis added PCA for pain control  Protein calorie malnutrition -albumin has dropped acutely to 1.7 Plan for PICC line  for TNA   PCCM to standby depending on how he does with OR visits   Best Practice   Diet/type: NPO DVT prophylaxis: SCD GI prophylaxis: PPI Lines: Arterial Line Foley:  Yes, and it is still needed Code Status:  full code Last date of multidisciplinary goals of care discussion [per primary]   Kara Mead MD. [place] Flood. [facility] Pager : [phone]  If no response to pager , please call [phone] until 7 pm After 7:00 pm call Elink  [phone]     08/15/2020, 8:40 AM

## 2020-08-15 NOTE — Progress Notes (Signed)
Spoke with Ebony Hail RN re PICC order for TNA/  States pt for OR today.  Plan on PICC placement this pm.

## 2020-08-15 NOTE — Transfer of Care (Signed)
Immediate Anesthesia Transfer of Care Note  Patient: Roger Brennan  Procedure(s) Performed: REPAIR ABDOMINAL WALL/REEXPLORATION OF ABDOMINAL WALL, IRRIGATION AND DEBRIDEMENT (Abdomen)  Patient Location: PACU  Anesthesia Type:General  Level of Consciousness: awake and alert   Airway & Oxygen Therapy: Patient Spontanous Breathing and Patient connected to face mask oxygen  Post-op Assessment: Report given to RN and Post -op Vital signs reviewed and stable  Post vital signs: Reviewed and stable  Last Vitals:  Vitals Value Taken Time  BP    Temp    Pulse 103 08/15/20 1214  Resp 14 08/15/20 1214  SpO2 100 % 08/15/20 1214  Vitals shown include unvalidated device data.  Last Pain:  Vitals:   08/15/20 0853  TempSrc:   PainSc: 1       Patients Stated Pain Goal: 3 (72/90/21 1155)  Complications: No notable events documented.

## 2020-08-15 NOTE — Anesthesia Postprocedure Evaluation (Signed)
Anesthesia Post Note  Patient: Berton Fauver  Procedure(s) Performed: REPAIR ABDOMINAL WALL/REEXPLORATION OF ABDOMINAL WALL, IRRIGATION AND DEBRIDEMENT (Abdomen)     Patient location during evaluation: PACU Anesthesia Type: General Level of consciousness: awake and alert Pain management: pain level controlled Vital Signs Assessment: post-procedure vital signs reviewed and stable Respiratory status: spontaneous breathing, nonlabored ventilation, respiratory function stable and patient connected to nasal cannula oxygen Cardiovascular status: blood pressure returned to baseline and stable Postop Assessment: no apparent nausea or vomiting Anesthetic complications: no   No notable events documented.  Last Vitals:  Vitals:   08/15/20 1230 08/15/20 1240  BP:    Pulse: 100 (!) 101  Resp: 12 12  Temp:    SpO2: 99% 99%    Last Pain:  Vitals:   08/15/20 1230  TempSrc:   PainSc: 0-No pain                 Carvel Huskins L Aunika Kirsten

## 2020-08-15 NOTE — Progress Notes (Signed)
Peripherally Inserted Central Catheter Placement  The IV Nurse has discussed with the patient and/or persons authorized to consent for the patient, the purpose of this procedure and the potential benefits and risks involved with this procedure.  The benefits include less needle sticks, lab draws from the catheter, and the patient may be discharged home with the catheter. Risks include, but not limited to, infection, bleeding, blood clot (thrombus formation), and puncture of an artery; nerve damage and irregular heartbeat and possibility to perform a PICC exchange if needed/ordered by physician.  Alternatives to this procedure were also discussed.  Bard Power PICC patient education guide, fact sheet on infection prevention and patient information card has been provided to patient /or left at bedside.    PICC Placement Documentation  PICC Triple Lumen 30/05/11 PICC Right Basilic 40 cm 0 cm (Active)  Indication for Insertion or Continuance of Line Administration of hyperosmolar/irritating solutions (i.e. TPN, Vancomycin, etc.) 08/15/20 1600  Exposed Catheter (cm) 0 cm 08/15/20 1600  Site Assessment Clean;Dry;Intact 08/15/20 1600  Lumen #1 Status Flushed;Saline locked;Blood return noted 08/15/20 1600  Lumen #2 Status Flushed;Saline locked;Blood return noted 08/15/20 1600  Lumen #3 Status Flushed;Saline locked;Blood return noted 08/15/20 1600  Dressing Type Transparent;Securing device 08/15/20 1600  Dressing Status Clean;Dry;Intact 08/15/20 1600  Antimicrobial disc in place? Yes 08/15/20 1600  Safety Lock Not Applicable 03/14/15 3567  Line Care Connections checked and tightened 08/15/20 1600  Dressing Intervention New dressing 08/15/20 1600  Dressing Change Due 08/22/20 08/15/20 1600       Holley Bouche Renee 08/15/2020, 4:40 PM

## 2020-08-15 NOTE — Interval H&P Note (Signed)
History and Physical Interval Note:  08/15/2020 11:06 AM  Paulene Floor  has presented today for surgery, with the diagnosis of NECROTIZING FACIITIS.  The various methods of treatment have been discussed with the patient and family. After consideration of risks, benefits and other options for treatment, the patient has consented to  Procedure(s): REPAIR ABDOMINAL WALL/REEXPLORATION OF ABDOMINAL WALL,POSSIBLE DEBRIDEMENT (N/A) as a surgical intervention.  The patient's history has been reviewed, patient examined, no change in status, stable for surgery.  I have reviewed the patient's chart and labs.  Questions were answered to the patient's satisfaction.   Pt seen examined agree OR today for wound washout and possible debridement  The procedure has been discussed with the patient.  Alternative therapies have been discussed with the patient.  Operative risks include bleeding,  Infection,  Organ injury,  Nerve injury,  Blood vessel injury,  DVT,  Pulmonary embolism,  Death,  And possible reoperation.  Medical management risks include worsening of present situation.  The success of the procedure is 50 -90 % at treating patients symptoms.  The patient understands and agrees to proceed.   [place]

## 2020-08-15 NOTE — Progress Notes (Signed)
PHARMACY - TOTAL PARENTERAL NUTRITION CONSULT NOTE   Indication: abdominal wall abscess, necrotizing soft tissue of abdominal wall, perforated diverticulitis with colocutaneous fistula   Patient Measurements: Height: 5\' 10"  (177.8 cm) Weight: 86.1 kg (189 lb 13.1 oz) IBW/kg (Calculated) : 73 TPN AdjBW (KG): 73.9 Body mass index is 27.24 kg/m. Usual Weight: 84 kg  Assessment:   Glucose / Insulin: Hemoglobin A1C 7.4, on moderate SSI q4h, CBGs < 180 Electrolytes: WNL except phos elevated at 5.7 (possibly due to muscle necrosis), Corrected calcium 9.64 Renal: WNL Hepatic: WNL Intake / Output; MIVF: UOP 1465 ml, Total Output 3030, Net +2015,  NS 100 ml/hr GI Imaging:CT abdomen pelvis ordered GI Surgeries / Procedures:  7/13: exploratory laparotomy, partial colectomy 7/15: exploratory laparotomy 7/16: repair abdominal wall/reexploration of abdominal wall to evaluate any further necrosis, possible debridement  Central access: Plan on PICC placement this pm TPN start date: 7/16 at 1800 or after when PICC placed  Nutritional Goals (per RD recommendation on 7/15): kCal: 2500-2700, Protein: 140-155 g, Fluid: >/= 2.5 L/day Goal TPN rate is 125 mL/hr (provides 150 g of protein and 2640 kcals per day)  Current Nutrition:  NPO except for ice chips Start TPN 7/16 1800 (or after when PICC placed)  Plan:  Start TPN at 42 mL/hr at 1800 or after when PICC placed Electrolytes in TPN: Na 67mEq/L, K 36mEq/L, Ca 70mEq/L, Mg 55mEq/L, and no Phos. Cl:Ac 1:1 Add standard MVI and trace elements to TPN Continue moderate SSI q4h and adjust as needed  Continue NS at 100 ml/hr Monitor TPN labs on Mon/Thurs,  CMET, mag, phos in am  Amirah Goerke P. Legrand Como, PharmD, [place] Please utilize Amion for appropriate phone number to reach the unit pharmacist ([place]) 08/15/2020 10:17 AM

## 2020-08-16 ENCOUNTER — Encounter (HOSPITAL_COMMUNITY): Payer: Self-pay | Admitting: Surgery

## 2020-08-16 DIAGNOSIS — A419 Sepsis, unspecified organism: Secondary | ICD-10-CM | POA: Diagnosis not present

## 2020-08-16 DIAGNOSIS — R6520 Severe sepsis without septic shock: Secondary | ICD-10-CM | POA: Diagnosis not present

## 2020-08-16 DIAGNOSIS — K631 Perforation of intestine (nontraumatic): Secondary | ICD-10-CM | POA: Diagnosis not present

## 2020-08-16 LAB — BPAM RBC
Blood Product Expiration Date: 202208142359
Blood Product Expiration Date: 202208162359
ISSUE DATE / TIME: 202207151130
ISSUE DATE / TIME: 202207151130
Unit Type and Rh: 5100
Unit Type and Rh: 5100

## 2020-08-16 LAB — COMPREHENSIVE METABOLIC PANEL
ALT: 16 U/L (ref 0–44)
AST: 14 U/L — ABNORMAL LOW (ref 15–41)
Albumin: 1.7 g/dL — ABNORMAL LOW (ref 3.5–5.0)
Alkaline Phosphatase: 78 U/L (ref 38–126)
Anion gap: 6 (ref 5–15)
BUN: 28 mg/dL — ABNORMAL HIGH (ref 6–20)
CO2: 25 mmol/L (ref 22–32)
Calcium: 7.7 mg/dL — ABNORMAL LOW (ref 8.9–10.3)
Chloride: 108 mmol/L (ref 98–111)
Creatinine, Ser: 0.77 mg/dL (ref 0.61–1.24)
GFR, Estimated: >60 mL/min (ref >60)
Glucose, Bld: 241 mg/dL — ABNORMAL HIGH (ref 70–99)
Potassium: 3.8 mmol/L (ref 3.5–5.1)
Sodium: 139 mmol/L (ref 135–145)
Total Bilirubin: 0.7 mg/dL (ref 0.3–1.2)
Total Protein: 4.6 g/dL — ABNORMAL LOW (ref 6.5–8.1)

## 2020-08-16 LAB — GLUCOSE, CAPILLARY
Glucose-Capillary: 224 mg/dL — ABNORMAL HIGH (ref 70–99)
Glucose-Capillary: 186 mg/dL — ABNORMAL HIGH (ref 70–99)
Glucose-Capillary: 178 mg/dL — ABNORMAL HIGH (ref 70–99)
Glucose-Capillary: 220 mg/dL — ABNORMAL HIGH (ref 70–99)
Glucose-Capillary: 299 mg/dL — ABNORMAL HIGH (ref 70–99)
Glucose-Capillary: 281 mg/dL — ABNORMAL HIGH (ref 70–99)

## 2020-08-16 LAB — TYPE AND SCREEN
ABO/RH(D): O POS
Antibody Screen: NEGATIVE
Unit division: 0
Unit division: 0

## 2020-08-16 LAB — CBC
HCT: 29 % — ABNORMAL LOW (ref 39.0–52.0)
HCT: 22 % — ABNORMAL LOW (ref 39.0–52.0)
Hemoglobin: 9.3 g/dL — ABNORMAL LOW (ref 13.0–17.0)
Hemoglobin: 7 g/dL — ABNORMAL LOW (ref 13.0–17.0)
MCH: 29.8 pg (ref 26.0–34.0)
MCH: 30.2 pg (ref 26.0–34.0)
MCHC: 32.1 g/dL (ref 30.0–36.0)
MCHC: 31.8 g/dL (ref 30.0–36.0)
MCV: 92.9 fL (ref 80.0–100.0)
MCV: 94.8 fL (ref 80.0–100.0)
Platelets: 441 10*3/uL — ABNORMAL HIGH (ref 150–400)
Platelets: 404 10*3/uL — ABNORMAL HIGH (ref 150–400)
RBC: 3.12 MIL/uL — ABNORMAL LOW (ref 4.22–5.81)
RBC: 2.32 MIL/uL — ABNORMAL LOW (ref 4.22–5.81)
RDW: 14.6 % (ref 11.5–15.5)
RDW: 14.5 % (ref 11.5–15.5)
WBC: 22 10*3/uL — ABNORMAL HIGH (ref 4.0–10.5)
WBC: 27.3 10*3/uL — ABNORMAL HIGH (ref 4.0–10.5)
nRBC: 0 % (ref 0.0–0.2)
nRBC: 0 % (ref 0.0–0.2)

## 2020-08-16 LAB — PHOSPHORUS: Phosphorus: 3 mg/dL (ref 2.5–4.6)

## 2020-08-16 LAB — PREPARE RBC (CROSSMATCH)

## 2020-08-16 LAB — MAGNESIUM: Magnesium: 2.4 mg/dL (ref 1.7–2.4)

## 2020-08-16 MED ORDER — SODIUM CHLORIDE 0.9 % IV SOLN: INTRAVENOUS | Status: DC

## 2020-08-16 MED ORDER — SODIUM CHLORIDE 0.9% IV SOLUTION: Freq: Once | INTRAVENOUS | Status: AC

## 2020-08-16 MED ORDER — TRACE MINERALS CU-MN-SE-ZN 300-55-60-3000 MCG/ML IV SOLN
INTRAVENOUS | Status: AC
  Filled 2020-08-16: qty 504

## 2020-08-16 MED ORDER — PANTOPRAZOLE SODIUM 40 MG IV SOLR
40.0000 mg | Freq: Two times a day (BID) | INTRAVENOUS | Status: DC
  Administered 2020-08-16 – 2020-08-27 (×24): 40 mg via INTRAVENOUS
  Filled 2020-08-16 (×23): qty 40

## 2020-08-16 NOTE — Progress Notes (Signed)
Occupational Therapy Treatment Patient Details Name: Roger Brennan MRN: [id number] DOB: 1960/06/20 Today's Date: 08/16/2020    History of present illness Patient is 60 yo  old male who presented [facility]/12/20 at the direction of PCP with LLQ abdominal pain and induration for the last 2-3 weeks, 20 pound weight loss.  CT-Colon perforation with abscess and fistulization to abdominal wall .  S/P drainage and debridement of left lower quadrant abdominal wall ,. partial colectomy with end colostomy , Splenic mobilization on 08/12/20 emergently. 7/15 intestinal drainage from the wound >> emergent exploration; recurrent peritonitis, LOA ,Small bowel resection with anastomosis, enterrorhaphy x 2, debridement of 200 cm^2 of necrotic fat and fascia of abdominal wall; 7/16 debridement of abdominal wall   OT comments  Patient has had two more surgeries since occupational therapy evaluation. Treatment focused on promoting functional mobility and activity tolerance in preparation for ADLs and out of bed activity. Mod x 2 to roll and max x 2 for bed transfers. Patient mod x 2 to stand and take two steps to head of bed. Patient limited by pain and weakness. Patient's lower extremities edematous in lower legs. Therapist encouraged patient and then his wife to perform ankle pumps, lower extremity movement, and fidgeting while in bed. They both verbalized understanding. Education with wife in regards to [place]. Due to unexpected surgeries and extensive abdominal wounds therapist suggests potential for CIR at discharge. Wife reports patient will have 24/7 care at home when he discharges and "will do anything that she has to."    Follow Up Recommendations  CIR    Equipment Recommendations  Tub/shower seat    Recommendations for Other Services Rehab consult    Precautions / Restrictions Precautions Precaution Comments: colostomy, drains, mutiple lines, bandages over full abdomen, TPN, PCA Restrictions Weight Bearing  Restrictions: No       Mobility Bed Mobility Overal bed mobility: Needs Assistance Bed Mobility: Rolling;Sidelying to Sit;Sit to Sidelying Rolling: Mod assist;+2 for physical assistance;+2 for safety/equipment Sidelying to sit: +2 for physical assistance;+2 for safety/equipment;Mod assist     Sit to sidelying: +2 for safety/equipment;+2 for physical assistance;Max assist General bed mobility comments: Educated on how to perform log roll    Transfers Overall transfer level: Needs assistance Equipment used: 2 person hand held assist             General transfer comment: Mod x 2 to stand and take two steps to head of bed. Limited by pain and overall weakness    Balance Overall balance assessment: Needs assistance Sitting-balance support: Feet unsupported;Bilateral upper extremity supported Sitting balance-Leahy Scale: Fair Sitting balance - Comments: supporting self with BUEs at edge of bed - propping for support not balance                                     Vision Patient Visual Report: No change from baseline     Perception     Praxis      Cognition Arousal/Alertness: Awake/alert Behavior During Therapy: WFL for tasks assessed/performed Overall Cognitive Status: Within Functional Limits for tasks assessed                                          Exercises     Shoulder Instructions       General Comments  Pertinent Vitals/ Pain       Pain Assessment: Faces Faces Pain Scale: Hurts even more Pain Location: abdomen (with transfers) Pain Descriptors / Indicators: Discomfort;Grimacing;Guarding Pain Intervention(s): PCA encouraged;Repositioned;Limited activity within patient's tolerance  Home Living                                          Prior Functioning/Environment              Frequency  Min 2X/week        Progress Toward Goals  OT Goals(current goals can now be found in the care  plan section)  Progress towards OT goals: Not progressing toward goals - comment (mulitple surgeries)  Acute Rehab OT Goals Patient Stated Goal: return to baseline OT Goal Formulation: With patient/family Time For Goal Achievement: 08/27/20 Potential to Achieve Goals: Good  Plan Discharge plan needs to be updated    Co-evaluation          OT goals addressed during session: Other (comment) (functional mobility, activity tolerance)      AM-PAC OT "6 Clicks" Daily Activity     Outcome Measure   Help from another person eating meals?: Total (npo) Help from another person taking care of personal grooming?: A Lot Help from another person toileting, which includes using toliet, bedpan, or urinal?: Total Help from another person bathing (including washing, rinsing, drying)?: A Lot Help from another person to put on and taking off regular upper body clothing?: A Lot Help from another person to put on and taking off regular lower body clothing?: Total 6 Click Score: 9    End of Session Equipment Utilized During Treatment: Oxygen  OT Visit Diagnosis: Other abnormalities of gait and mobility (R26.89);Pain   Activity Tolerance Patient limited by pain;Patient limited by fatigue   Patient Left in bed;with call bell/phone within reach;with nursing/sitter in room;with family/visitor present   Nurse Communication Mobility status        Time: 7989-2119 OT Time Calculation (min): 21 min  Charges: OT General Charges $OT Visit: 1 Visit OT Treatments $Therapeutic Activity: 8-22 mins  Derl Barrow, OTR/L [place]  Office [phone] Pager: [place] 08/16/2020, 1:07 PM

## 2020-08-16 NOTE — Progress Notes (Signed)
PHARMACY - TOTAL PARENTERAL NUTRITION CONSULT NOTE   Indication: abdominal wall abscess, necrotizing soft tissue of abdominal wall, perforated diverticulitis with colocutaneous fistula   Patient Measurements: Height: 5\' 10"  (177.8 cm) Weight: 86.1 kg (189 lb 13.1 oz) IBW/kg (Calculated) : 73 TPN AdjBW (KG): 73.9 Body mass index is 27.24 kg/m. Usual Weight: 84 kg  Assessment:   Glucose / Insulin: Hemoglobin A1C 7.4, on moderate SSI q4h,  CBGs 125-241 with 14 units of insulin administered in past 24 hours Electrolytes: WNL (hyperphosphatemia resolved), Corrected calcium 9.54 Renal: WNL Hepatic: WNL Intake / Output; MIVF: UOP 1200 ml, Total Output 3305, Net -106.6,  NS 100 ml/hr GI Imaging:CT abdomen pelvis ordered GI Surgeries / Procedures:  7/13: exploratory laparotomy, partial colectomy 7/15: exploratory laparotomy 7/16: repair abdominal wall/reexploration of abdominal wall to evaluate any further necrosis, possible debridement  Central access: PICC placed 7/16 TPN start date: 7/16   Nutritional Goals (per RD recommendation on 7/15): kCal: 2500-2700, Protein: 140-155 g, Fluid: >/= 2.5 L/day Goal TPN rate is 125 mL/hr (provides 150 g of protein and 2640 kcals per day)  Current Nutrition:  NPO except for ice chips TPN  Plan:  Continue TPN at 42 mL/hr at 1800  Electrolytes in TPN: Na 15mEq/L, K 62mEq/L, Ca 80mEq/L, Mg 42mEq/L, and add Phos 5 mmol/L. Cl:Ac 1:1 Add standard MVI and trace elements to TPN Add insulin aspart 10 units/L to TPN Continue moderate SSI q4h and adjust as needed  Decrease NS to 70 ml/hr Monitor TPN labs on Mon/Thurs  Simona Rocque P. Legrand Como, PharmD, [place] Please utilize Amion for appropriate phone number to reach the unit pharmacist ([place]) 08/16/2020 7:51 AM

## 2020-08-16 NOTE — Progress Notes (Signed)
Assessment & Plan: POD#4/2/1 - status post partial colectomy with colostomy for perforated diverticulitis; abdominal wall debridement for necrotizing fasciitis; small bowel resection - Dr. Kieth Brightly  Dressing change yesterday with Dr. Brantley Stage - BID dressing changes at bedside now  WBC improving - 22K this AM  TNA, IVF  NG, NPO  IV Vanco, Zosyn        Armandina Gemma, MD       [facility]       Office: [phone]   Chief Complaint: Perforated diverticulitis, necrotizing fasciitis  Subjective: Patient in ICU, alert.  No complaints.  Objective: Vital signs in last 24 hours: Temp:  [97.1 F (36.2 C)-98.4 F (36.9 C)] 98.2 F (36.8 C) (07/17 0340) Pulse Rate:  [64-106] 77 (07/17 0600) Resp:  [9-21] 21 (07/17 0600) BP: (137-162)/(76-99) 162/99 (07/17 0600) SpO2:  [92 %-100 %] 96 % (07/17 0600) Arterial Line BP: (82-154)/(59-74) 140/59 (07/16 1500) FiO2 (%):  [0 %-21 %] 0 % (07/17 0439) Last BM Date:  (colostomy with no output)  Intake/Output from previous day: 07/16 0701 - 07/17 0700 In: 3198.4 [I.V.:2570.3; IV Piggyback:628.2] Out: 3305 [Urine:1200; Emesis/NG output:1800; Drains:280; Blood:25] Intake/Output this shift: Total I/O In: 365.4 [I.V.:351; IV Piggyback:14.5] Out: -   Physical Exam: HEENT - sclerae clear, mucous membranes moist Neck - soft Abdomen - dressing dry and intact; stoma viable; JP drain with thin serous Ext - no edema, non-tender Neuro - alert & oriented, no focal deficits  Lab Results:  Recent Labs    08/15/20 0500 08/16/20 0452  WBC 39.5* 22.0*  HGB 9.9* 9.3*  HCT 29.8* 29.0*  PLT 453* 441*   BMET Recent Labs    08/15/20 0500 08/16/20 0452  NA 136 139  K 4.4 3.8  CL 108 108  CO2 20* 25  GLUCOSE 162* 241*  BUN 32* 28*  CREATININE 0.94 0.77  CALCIUM 7.8* 7.7*   PT/INR No results for input(s): LABPROT, INR in the last 72 hours. Comprehensive Metabolic Panel:    Component Value Date/Time   NA 139  08/16/2020 0452   NA 136 08/15/2020 0500   K 3.8 08/16/2020 0452   K 4.4 08/15/2020 0500   CL 108 08/16/2020 0452   CL 108 08/15/2020 0500   CO2 25 08/16/2020 0452   CO2 20 (L) 08/15/2020 0500   BUN 28 (H) 08/16/2020 0452   BUN 32 (H) 08/15/2020 0500   CREATININE 0.77 08/16/2020 0452   CREATININE 0.94 08/15/2020 0500   GLUCOSE 241 (H) 08/16/2020 0452   GLUCOSE 162 (H) 08/15/2020 0500   CALCIUM 7.7 (L) 08/16/2020 0452   CALCIUM 7.8 (L) 08/15/2020 0500   AST 14 (L) 08/16/2020 0452   AST 15 08/15/2020 0500   ALT 16 08/16/2020 0452   ALT 18 08/15/2020 0500   ALKPHOS 78 08/16/2020 0452   ALKPHOS 89 08/15/2020 0500   BILITOT 0.7 08/16/2020 0452   BILITOT 1.2 08/15/2020 0500   PROT 4.6 (L) 08/16/2020 0452   PROT 4.8 (L) 08/15/2020 0500   ALBUMIN 1.7 (L) 08/16/2020 0452   ALBUMIN 1.7 (L) 08/15/2020 0500    Studies/Results: [place] EKG SITE RITE  Result Date: 08/15/2020 If Site Rite image not attached, placement could not be confirmed due to current cardiac rhythm.  [place] EKG SITE RITE  Result Date: 08/14/2020 If Site Rite image not attached, placement could not be confirmed due to current cardiac rhythm.     Armandina Gemma 08/16/2020   Patient ID: Roger Brennan, male  DOB: 1960-03-04, 60 y.o.   MRN: [id number]

## 2020-08-16 NOTE — Progress Notes (Addendum)
   NAME:  Roger Brennan, MRN:  [id number], DOB:  March 08, 1960, LOS: 4 ADMISSION DATE:  08/12/2020, CONSULTATION DATE:  08/12/20 REFERRING MD:  Kinsinger - CCS, CHIEF COMPLAINT:  abdominal pain // colon perforation   Brief Narrative:  60 yo M presented to [facility] ED 08/12/20 with LLQ abdominal pain CT a/p was obtained which revealed a color perforation, abscess formation and fistulization to abdominal wall. Taken emergently to the OR for ex lap, debridement  of necrotizing soft tissue infection, partial colectomy with end colostomy.     Pertinent  Medical History  Inguinal hernia repair (distant)   Significant Hospital Events:  7/13 ED presentation with worsening abdominal pain. 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 On vanc/cefepime/flagyl pre op.  7/15 intestinal drainage from the wound >> emergent exploration >> recurrent peritonitis, LOA ,Small bowel resection with anastomosis, enterrorhaphy x 2, debridement of 200 cm^2 of necrotic fat and fascia of abdominal wall 7/16 debridement of abdominal wall  Interim History / Subjective:   Afebrile, stable critically ill Good urine output On PCA  Objective   Blood pressure (!) 162/99, pulse 77, temperature 98.2 F (36.8 C), temperature source Axillary, resp. rate 13, height 5\' 10"  (1.778 m), weight 86.1 kg, SpO2 100 %.    FiO2 (%):  [0 %-21 %] 21 %   Intake/Output Summary (Last 24 hours) at 08/16/2020 0918 Last data filed at 08/16/2020 7253 Gross per 24 hour  Intake 3749.76 ml  Output 3200 ml  Net 549.76 ml    Filed Weights   08/12/20 0958 08/14/20 1420 08/15/20 0445  Weight: 73.9 kg 83.8 kg 86.1 kg    Examination: General: Acutely ill appearing elderly male lying in bed in NAD HEENT: [place]/AT, MM pink/moist, PERRL  Neuro: Alert and oriented x3, non-focal CV: S1-S2 mildly tachycardic PULM: Clear breath sounds bilateral, no accessory muscle use GI: soft, bowel  sounds hypoactive in all 4 quadrants, tender, multiple ABD -wounds covered with bandage, JP drain with serosanguineous Extremities: warm/dry, no edema  Skin: no rashes or lesions  Labs show decrease leukocytosis, stable electrolytes, stable anemia, albumin 1.7  Resolved Hospital Problem list   Acute metabolic encephalopathy Severe sepsis -Pressors stopped 7/14 Lactic acidosis  AKI, POA   Hyponatremia, mild   Assessment & Plan:   Feculent peritonitis Colon perforation with abscess and fistula formation, necrotizing ssti of abdominal wall  -s/p ex lap debridement partial colectomy and colostomy 7/13, suspected perf diverticulitis  Developed EC fistula POD 2 P: Primary management per CCS  Continue empiric Zosyn and Clindamycin  Eraxis added PCA for pain control  Dark red NG drainage -hemoglobin stable, start Protonix 40 every 12 and recheck CBC  Protein calorie malnutrition -albumin has dropped acutely to 1.7 -On TNA , can DC IV fluids   PCCM available as needed   Best Practice   Diet/type: NPO TNA DVT prophylaxis: SCD GI prophylaxis: PPI Lines: Arterial Line Foley:  Yes, and it is still needed Code Status:  full code Last date of multidisciplinary goals of care discussion [per primary]   Kara Mead MD. FCCP. [facility] Pager : [phone]  If no response to pager , please call [phone] until 7 pm After 7:00 pm call Elink  [phone]     08/16/2020, 9:18 AM

## 2020-08-17 DIAGNOSIS — K631 Perforation of intestine (nontraumatic): Secondary | ICD-10-CM | POA: Diagnosis not present

## 2020-08-17 DIAGNOSIS — A419 Sepsis, unspecified organism: Secondary | ICD-10-CM | POA: Diagnosis not present

## 2020-08-17 DIAGNOSIS — R6520 Severe sepsis without septic shock: Secondary | ICD-10-CM | POA: Diagnosis not present

## 2020-08-17 LAB — CBC
HCT: 22.6 % — ABNORMAL LOW (ref 39.0–52.0)
Hemoglobin: 7.3 g/dL — ABNORMAL LOW (ref 13.0–17.0)
MCH: 30.9 pg (ref 26.0–34.0)
MCHC: 32.3 g/dL (ref 30.0–36.0)
MCV: 95.8 fL (ref 80.0–100.0)
Platelets: 316 10*3/uL (ref 150–400)
RBC: 2.36 MIL/uL — ABNORMAL LOW (ref 4.22–5.81)
RDW: 14.3 % (ref 11.5–15.5)
WBC: 21.8 10*3/uL — ABNORMAL HIGH (ref 4.0–10.5)
nRBC: 0 % (ref 0.0–0.2)

## 2020-08-17 LAB — COMPREHENSIVE METABOLIC PANEL
ALT: 13 U/L (ref 0–44)
AST: 12 U/L — ABNORMAL LOW (ref 15–41)
Albumin: 1.5 g/dL — ABNORMAL LOW (ref 3.5–5.0)
Alkaline Phosphatase: 71 U/L (ref 38–126)
Anion gap: 3 — ABNORMAL LOW (ref 5–15)
BUN: 30 mg/dL — ABNORMAL HIGH (ref 6–20)
CO2: 25 mmol/L (ref 22–32)
Calcium: 7.5 mg/dL — ABNORMAL LOW (ref 8.9–10.3)
Chloride: 112 mmol/L — ABNORMAL HIGH (ref 98–111)
Creatinine, Ser: 0.66 mg/dL (ref 0.61–1.24)
GFR, Estimated: >60 mL/min (ref >60)
Glucose, Bld: 274 mg/dL — ABNORMAL HIGH (ref 70–99)
Potassium: 4.4 mmol/L (ref 3.5–5.1)
Sodium: 140 mmol/L (ref 135–145)
Total Bilirubin: 0.6 mg/dL (ref 0.3–1.2)
Total Protein: 4.1 g/dL — ABNORMAL LOW (ref 6.5–8.1)

## 2020-08-17 LAB — GLUCOSE, CAPILLARY
Glucose-Capillary: 238 mg/dL — ABNORMAL HIGH (ref 70–99)
Glucose-Capillary: 202 mg/dL — ABNORMAL HIGH (ref 70–99)
Glucose-Capillary: 185 mg/dL — ABNORMAL HIGH (ref 70–99)
Glucose-Capillary: 170 mg/dL — ABNORMAL HIGH (ref 70–99)
Glucose-Capillary: 168 mg/dL — ABNORMAL HIGH (ref 70–99)

## 2020-08-17 LAB — CULTURE, BLOOD (ROUTINE X 2)
Culture: NO GROWTH
Culture: NO GROWTH
Special Requests: ADEQUATE
Special Requests: ADEQUATE

## 2020-08-17 LAB — DIFFERENTIAL
Abs Immature Granulocytes: 1.97 10*3/uL — ABNORMAL HIGH (ref 0.00–0.07)
Basophils Absolute: 0.1 10*3/uL (ref 0.0–0.1)
Basophils Relative: 0 %
Eosinophils Absolute: 0.1 10*3/uL (ref 0.0–0.5)
Eosinophils Relative: 0 %
Immature Granulocytes: 9 %
Lymphocytes Relative: 8 %
Lymphs Abs: 1.7 10*3/uL (ref 0.7–4.0)
Monocytes Absolute: 1.1 10*3/uL — ABNORMAL HIGH (ref 0.1–1.0)
Monocytes Relative: 5 %
Neutro Abs: 16.9 10*3/uL — ABNORMAL HIGH (ref 1.7–7.7)
Neutrophils Relative %: 78 %

## 2020-08-17 LAB — MAGNESIUM: Magnesium: 2.1 mg/dL (ref 1.7–2.4)

## 2020-08-17 LAB — HEMOGLOBIN AND HEMATOCRIT, BLOOD
HCT: 20.7 % — ABNORMAL LOW (ref 39.0–52.0)
HCT: 25 % — ABNORMAL LOW (ref 39.0–52.0)
Hemoglobin: 6.9 g/dL — CL (ref 13.0–17.0)
Hemoglobin: 8.3 g/dL — ABNORMAL LOW (ref 13.0–17.0)

## 2020-08-17 LAB — PREALBUMIN: Prealbumin: 11.4 mg/dL — ABNORMAL LOW (ref 18–38)

## 2020-08-17 LAB — PREPARE RBC (CROSSMATCH)

## 2020-08-17 LAB — PHOSPHORUS: Phosphorus: 2.4 mg/dL — ABNORMAL LOW (ref 2.5–4.6)

## 2020-08-17 LAB — SURGICAL PATHOLOGY

## 2020-08-17 LAB — TRIGLYCERIDES: Triglycerides: 141 mg/dL (ref <150)

## 2020-08-17 MED ORDER — TRAVASOL 10 % IV SOLN
INTRAVENOUS | Status: AC
  Filled 2020-08-17: qty 626.4

## 2020-08-17 MED ORDER — HEPARIN SODIUM (PORCINE) 5000 UNIT/ML IJ SOLN
5000.0000 [IU] | Freq: Three times a day (TID) | INTRAMUSCULAR | Status: DC
  Administered 2020-08-17 – 2020-08-20 (×10): 5000 [IU] via SUBCUTANEOUS
  Filled 2020-08-17 (×9): qty 1

## 2020-08-17 MED ORDER — INSULIN ASPART 100 UNIT/ML IJ SOLN
0.0000 [IU] | INTRAMUSCULAR | Status: DC
  Administered 2020-08-17 (×3): 4 [IU] via SUBCUTANEOUS
  Administered 2020-08-18 – 2020-08-20 (×9): 3 [IU] via SUBCUTANEOUS

## 2020-08-17 MED ORDER — SODIUM CHLORIDE 0.9% IV SOLUTION: Freq: Once | INTRAVENOUS | Status: AC

## 2020-08-17 NOTE — Progress Notes (Addendum)
PHARMACY - TOTAL PARENTERAL NUTRITION CONSULT NOTE   Indication: Intolerance to enteral feeding  Patient Measurements: Height: 5\' 10"  (177.8 cm) Weight: 86.1 kg (189 lb 13.1 oz) IBW/kg (Calculated) : 73 TPN AdjBW (KG): 73.9 Body mass index is 27.24 kg/m. Usual Weight: 84 kg  Assessment: 25 yoM admitted on 7/13 with abdominal wall abscess, necrotizing soft tissue of abdominal wall, perforated diverticulitis with colocutaneous fistula.  Glucose / Insulin: A1C 7.4, no medications PTA - CBGs: 178-299 - Administered 32 units / 24h on moderate SSI q4h - Dexamethasone 5mg  (7/13), and Dex 10mg  (7/15) - Start insulin in TPN on 7/17: Regular insulin 10 units  Electrolytes: K up to 4.4, Cl up to 112, Phos down 2.4, Corrected calcium 9.5.  Others WNL Renal: SCr 0.66, BUN elevated at 30 Hepatic: LFTs wnl, Tbili 0.6 Triglycerides:  71 ( 7/16), 141 (7/18) Prealbumin: < 5 (7/16), 11.4 (7/18) Intake / Output; MIVF:  - UOP 1050 mL, NG 42mL, drains 254mL; I/O Net +1440. - IVF d/c on 7/17 GI Imaging:  7/13 CT: perforated diverticulitis with associated fistulous connection to the abdominal wall. Perforated neoplasm cannot be completely excluded. GI Surgeries / Procedures:  7/13: exploratory laparotomy, partial colectomy 7/15: exploratory laparotomy 7/16: repair abdominal wall/reexploration of abdominal wall to evaluate any further necrosis, possible debridement  Central access: PICC placed 7/16 TPN start date: 7/16   Nutritional Goals (per RD recommendation on 7/15): kCal: 2500-2700, Protein: 140-155 g, Fluid: >/= 2.5 L/day Goal TPN rate is 110 mL/hr (provides 153 g of protein and 2618 kcals per day)  Current Nutrition:  NPO except for ice chips TPN  Plan:  At 18:00 Continue TPN at 45 mL/hr - not advancing rate due to hyperglycemia Electrolytes in TPN: Na 17mEq/L, K 39mEq/L, Ca 74mEq/L, Mg 63mEq/L, and Phos 15 mmol/L, Cl:Ac 1:2 Add standard MVI and trace elements to TPN Increase insulin  in TPN: 20 units Increase to resistant SSI q4h and adjust as needed  Monitor TPN labs on Mon/Thurs; BMET, mag, phos with AM labs.    Gretta Arab PharmD, BCPS Clinical Pharmacist [facility] main pharmacy [phone] 08/17/2020 7:53 AM

## 2020-08-17 NOTE — Progress Notes (Signed)
PT Cancellation Note  Patient Details Name: Roger Brennan MRN: [id number] DOB: Jan 02, 1961   Cancelled Treatment:    Reason Eval/Treat Not Completed: Patient at procedure or test/unavailable,WOCRN in room. Will check back another time.   Claretha Cooper 08/17/2020, 2:07 PM [place] Pager [phone] Office [phone]

## 2020-08-17 NOTE — Progress Notes (Signed)
Pharmacy Antibiotic Note  Roger Brennan is a 60 y.o. male admitted on 08/12/2020 with intra-abdominal infection.  Pharmacy has been consulted for Zosyn dosing.  Eraxis added per MD.  Plan: Continue Zosyn 3.375g IV Q8H infused over 4hrs.  Follow up renal function, culture results, and clinical course.   Height: 5\' 10"  (177.8 cm) Weight: 86.1 kg (189 lb 13.1 oz) IBW/kg (Calculated) : 73  Temp (24hrs), Avg:97.9 F (36.6 C), Min:97.5 F (36.4 C), Max:98.2 F (36.8 C)  Recent Labs  Lab 08/12/20 1010 08/12/20 1215 08/12/20 1833 08/13/20 1233 08/14/20 0440 08/15/20 0500 08/16/20 0452 08/16/20 1748 08/17/20 0303  WBC 36.0*  --    < > 37.2* 45.6* 39.5* 22.0* 27.3* 21.8*  CREATININE 1.45*  --    < > 1.20 1.02 0.94 0.77  --  0.66  LATICACIDVEN 2.6* 2.1*  --   --   --   --   --   --   --    < > = values in this interval not displayed.     Estimated Creatinine Clearance: 101.4 mL/min (by C-G formula based on SCr of 0.66 mg/dL).    No Known Allergies  Antimicrobials this admission: 7/13 Vanc 1500 >> 7/15 7/13 cefepime 2 gm x 1 7/13 flagyl x 1 7/13 Clindamycin >>7/18 7/13 Zosyn >>  7/15 Eraxis >>    Dose adjustments this admission: 7/14 increase vanc from 1250mg  to 1500mg  q24h (SCr 1.1, est AUC 429)   Microbiology results: 7/13 UCx: 5k E.coli 7/13 BCx2: ngF  Thank you for allowing pharmacy to be a part of this patient's care.  Gretta Arab PharmD, BCPS Clinical Pharmacist [facility] main pharmacy [phone] 08/17/2020 9:37 AM

## 2020-08-17 NOTE — Progress Notes (Signed)
NAME:  Garlan Drewes, MRN:  [id number], DOB:  Sep 01, 1960, LOS: 5 ADMISSION DATE:  08/12/2020, CONSULTATION DATE:  08/12/20 REFERRING MD:  Kinsinger - CCS, CHIEF COMPLAINT:  abdominal pain // colon perforation   Brief Narrative:  60 year old man with PMHx significant for remote inguinal hernia repair and diverticulosis who presented to [facility] ED 7/13 with LLQ abdominal pain. CT A/P demonstrated perforated colonic diverticulum with abscess formation and fistulization to abdominal wall. Patient was taken emergently to the OR for ex-lap, debridement of necrotizing soft tissue infection and partial colectomy with end colostomy creation.   On POD#2, patient was noted to have drainage of enteric contents from the wound prompting emergent ex-lap which revealed recurrent peritonitis; patient underwent LOA and SBR with reanastomosis and enterorrhaphy x 2 and debridement of necrotic fat/fascia. Patient was taken for additional debridement of abdominal wall 7/16.  PCCM following for critical care/ICU needs.  Pertinent Medical History:  Inguinal hernia repair (distant)   Significant Hospital Events:  7/13 Presented to ED with worsening LLQ abdominal pain. 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 On vanc/cefepime/flagyl pre op.  7/15 Enteric drainage from the wound >> emergent exploration demonstrated recurrent peritonitis, LOA ,Small bowel resection with anastomosis, enterrorhaphy x 2, debridement of 200 cm^2 of necrotic fat and fascia of abdominal wall 7/16 debridement of abdominal wall  Interim History / Subjective:  Hgb 7.0 7/17, prompting transfusion Hgb 7.3 s/p 1U PRBCs Ongoing dark bloody NGT output, melenic stool in ostomy bag Patient denies pain at present, feeling better Denies fever/chills, n/v, abdominal pain Endorses increased LE edema  Objective:  Blood pressure (!) 147/68, pulse 86, temperature 97.8 F (36.6 C),  temperature source Oral, resp. rate 12, height 5\' 10"  (1.778 m), weight 86.1 kg, SpO2 98 %.    FiO2 (%):  [0 %-21 %] 21 %   Intake/Output Summary (Last 24 hours) at 08/17/2020 1151 Last data filed at 08/17/2020 1045 Gross per 24 hour  Intake 2176.75 ml  Output 2815 ml  Net -638.25 ml    Filed Weights   08/12/20 0958 08/14/20 1420 08/15/20 0445  Weight: 73.9 kg 83.8 kg 86.1 kg   Physical Examination: General: Acutely ill-appearing middle-aged man in NAD. HEENT: [place]/AT, anicteric sclera, PERRL, dry mucous membranes. NGT in place with bloody output. Neuro: Awake, oriented x 4. Responds to verbal stimuli. Following commands consistently. Moves all 4 extremities spontaneously. CV: RRR, no m/g/r. PULM: Breathing even and unlabored on RA. Lung fields CTAB anteriorly. GI: Soft, nondistended. Appropriately TTP postoperatively. Slightly hypoactive bowel sounds. Colostomy bag in place with dark melenic output. Surgical incisions covered with dressing, scant drainage noted. Extremities: Bilateral symmetric 1-2+ LE edema noted. Skin: Warm/dry, no rashes.  Resolved Hospital Problem List:  Acute metabolic encephalopathy Severe sepsis -Pressors stopped 7/14 Lactic acidosis  AKI, POA  Hyponatremia, mild   Assessment & Plan:   Feculent peritonitis Colonic perforation with abscess and fistula formation Necrotizing SSTI of abdominal wall  S/p ex lap debridement partial colectomy and colostomy 7/13, suspected perf diverticulitis; s/p repeat ex-lap with LOA/SBR POD#2 (7/15), s/p abdominal wall debridement (7/16). - Management per CCS - Per Surgery, NGT clamp trial today, 7/18 - Continue Zosyn, Eraxis - Clindamycin discontinued per pharmacy - PCA for pain control  Acute blood loss anemia Bloody NGT output Melena Ongoing bloody NGT and melenic stools with associated Hgb drift. - Trend H&H - Transfuse for Hgb < 7.0 - Did not bump appropriately after last transfusion (  7.3 from 7.0 after 1U  PRBCs) - 1U PRBCs ordered today, 7/18 for Hgb 6.9 - Continue PPI BID  Protein calorie malnutrition -albumin has dropped acutely to 1.7 - Continue TPN - Appreciate pharmacy assistance  Best Practice:  Diet/type: NPO TNA DVT prophylaxis: SCD GI prophylaxis: PPI Lines: Central line - PICC Foley:  N/A Code Status:  full code Last date of multidisciplinary goals of care discussion [Per primary]  Critical Care Time: N/A   Rhae Lerner [facility] 08/17/20 11:53 AM  Please see [email or web address] for pager details.  From 7A-7P if no response, please call [phone] After hours, please call ELink [phone]

## 2020-08-17 NOTE — Progress Notes (Signed)
2 Days Post-Op   Subjective/Chief Complaint: No complaints   Objective: Vital signs in last 24 hours: Temp:  [97.5 F (36.4 C)-98.2 F (36.8 C)] 98.2 F (36.8 C) (07/18 0300) Pulse Rate:  [66-103] 81 (07/18 0800) Resp:  [10-21] 10 (07/18 0800) BP: (104-158)/(70-95) 146/77 (07/18 0800) SpO2:  [93 %-100 %] 95 % (07/18 0800) FiO2 (%):  [0 %-21 %] 21 % (07/18 0744) Last BM Date: 08/11/20  Intake/Output from previous day: 07/17 0701 - 07/18 0700 In: 3257.3 [I.V.:2184.3; Blood:334; IV Piggyback:739] Out: 2130 [Urine:1050; Emesis/NG output:450; Drains:265; Stool:50] Intake/Output this shift: Total I/O In: -  Out: 650 [Urine:200; Stool:450]  General appearance: alert and cooperative Resp: clear to auscultation bilaterally Cardio: regular rate and rhythm GI: soft, nontender. Wounds clean. Ostomy pink and productive  Lab Results:  Recent Labs    08/16/20 1748 08/17/20 0303  WBC 27.3* 21.8*  HGB 7.0* 7.3*  HCT 22.0* 22.6*  PLT 404* 316   BMET Recent Labs    08/16/20 0452 08/17/20 0303  NA 139 140  K 3.8 4.4  CL 108 112*  CO2 25 25  GLUCOSE 241* 274*  BUN 28* 30*  CREATININE 0.77 0.66  CALCIUM 7.7* 7.5*   PT/INR No results for input(s): LABPROT, INR in the last 72 hours. ABG No results for input(s): PHART, HCO3 in the last 72 hours.  Invalid input(s): PCO2, PO2  Studies/Results: [place] EKG SITE RITE  Result Date: 08/15/2020 If Site Rite image not attached, placement could not be confirmed due to current cardiac rhythm.   Anti-infectives: Anti-infectives (From admission, onward)    Start     Dose/Rate Route Frequency Ordered Stop   08/15/20 1000  anidulafungin (ERAXIS) 100 mg in sodium chloride 0.9 % 100 mL IVPB        100 mg 78 mL/hr over 100 Minutes Intravenous Every 24 hours 08/14/20 1349     08/14/20 1600  anidulafungin (ERAXIS) 200 mg in sodium chloride 0.9 % 200 mL IVPB        200 mg 78 mL/hr over 200 Minutes Intravenous  Once 08/14/20 1349 08/14/20  2000   08/14/20 1000  vancomycin (VANCOREADY) IVPB 1500 mg/300 mL  Status:  Discontinued        1,500 mg 150 mL/hr over 120 Minutes Intravenous Every 24 hours 08/13/20 1009 08/14/20 0901   08/13/20 1000  vancomycin (VANCOREADY) IVPB 1250 mg/250 mL  Status:  Discontinued        1,250 mg 166.7 mL/hr over 90 Minutes Intravenous Every 24 hours 08/12/20 1910 08/13/20 1009   08/12/20 2200  piperacillin-tazobactam (ZOSYN) IVPB 3.375 g  Status:  Discontinued        3.375 g 12.5 mL/hr over 240 Minutes Intravenous Every 8 hours 08/12/20 1904 08/12/20 1941   08/12/20 2000  clindamycin (CLEOCIN) IVPB 600 mg        600 mg 100 mL/hr over 30 Minutes Intravenous Every 8 hours 08/12/20 1757     08/12/20 1830  piperacillin-tazobactam (ZOSYN) IVPB 3.375 g        3.375 g 12.5 mL/hr over 240 Minutes Intravenous Every 8 hours 08/12/20 1811     08/12/20 1015  vancomycin (VANCOREADY) IVPB 1500 mg/300 mL        1,500 mg 150 mL/hr over 120 Minutes Intravenous  Once 08/12/20 1007 08/12/20 1330   08/12/20 1000  ceFEPIme (MAXIPIME) 2 g in sodium chloride 0.9 % 100 mL IVPB        2 g 200 mL/hr over 30 Minutes Intravenous  Once 08/12/20 1000 08/12/20 1121   08/12/20 1000  metroNIDAZOLE (FLAGYL) IVPB 500 mg        500 mg 100 mL/hr over 60 Minutes Intravenous  Once 08/12/20 1000 08/12/20 1226   08/12/20 1000  vancomycin (VANCOCIN) IVPB 1000 mg/200 mL premix  Status:  Discontinued        1,000 mg 200 mL/hr over 60 Minutes Intravenous  Once 08/12/20 1000 08/12/20 1007       Assessment/Plan: s/p Procedure(s): REPAIR ABDOMINAL WALL/REEXPLORATION OF ABDOMINAL WALL, IRRIGATION AND DEBRIDEMENT (N/A) Will try clamping ng today POD#5/3/2 - status post partial colectomy with colostomy for perforated diverticulitis; abdominal wall debridement for necrotizing fasciitis; small bowel resection - Dr. Kieth Brightly F/E/N: Continue tpn. Try clamping trial ID: Continue zosyn day 5 and eraxis. D/c clinda per pharmacy VTE  prophylaxix. Scd's and sq heparin  LOS: 5 days    Roger Brennan 08/17/2020

## 2020-08-17 NOTE — Consult Note (Signed)
[place] Nurse ostomy consult note Stoma type/location:  LUQ colostomy. Pouch had over-filled and was difficult to empty and change due to near-leakage. Stomal assessment/size: 1 and 5/8 inches round, red, raised  Peristomal assessment: intact. Creases. Treatment options for stomal/peristomal skin: two skin barrier rings used to flatten creases and attain a flat pouching surface Output: thin dark stool Ostomy pouching:2pc. 2 and 3/4 inch pouching system with 2 skin barrier rings. Education provided:  Teaching session (initial) with patient and significant other, Neoma Laming. They know of only one other person with an ostomy and that was Deborah's father and it was many years ago. Neither one of them ever saw, smelled or discussed it.   Explained role of ostomy nurse and creation of stoma  Explained stoma characteristics (budded, color, texture, care) Demonstrated pouch change (cutting new skin barrier, measuring stoma, cleaning peristomal skin and stoma, use of two skin barrier rings) Education on emptying when 1/3 to 1/2 full and how to empty Discussed opaque pouches, also those with integrated gas filter post discharge.  Also the opportunity to use a mini closed-end pouch for times of intimacy or exercise (patient is a Biochemist, clinical) Demonstrated use of wick to clean spout Answered patient/family questions: Today, the discussion was about using a premoistened, flushable wipe to clean the bottom of the ostomy pouch.   2 pouches, skin barrier rings and a pattern are left in room.  I will bring teaching booklet by tomorrow and discuss Secure Start.   Enrolled patient in [place] program: No   WOC nursing team will follow, and will remain available to this patient, the nursing, surgical and medical teams.   Thanks, Maudie Flakes, MSN, RN, [place], Arther Abbott  Pager# [phone]

## 2020-08-18 ENCOUNTER — Encounter (HOSPITAL_COMMUNITY): Payer: Self-pay | Admitting: General Surgery

## 2020-08-18 DIAGNOSIS — K631 Perforation of intestine (nontraumatic): Secondary | ICD-10-CM | POA: Diagnosis not present

## 2020-08-18 DIAGNOSIS — R6520 Severe sepsis without septic shock: Secondary | ICD-10-CM | POA: Diagnosis not present

## 2020-08-18 DIAGNOSIS — A419 Sepsis, unspecified organism: Secondary | ICD-10-CM | POA: Diagnosis not present

## 2020-08-18 LAB — TYPE AND SCREEN
ABO/RH(D): O POS
Antibody Screen: NEGATIVE
Unit division: 0
Unit division: 0

## 2020-08-18 LAB — CBC
HCT: 24.8 % — ABNORMAL LOW (ref 39.0–52.0)
Hemoglobin: 8.1 g/dL — ABNORMAL LOW (ref 13.0–17.0)
MCH: 31.2 pg (ref 26.0–34.0)
MCHC: 32.7 g/dL (ref 30.0–36.0)
MCV: 95.4 fL (ref 80.0–100.0)
Platelets: 289 10*3/uL (ref 150–400)
RBC: 2.6 MIL/uL — ABNORMAL LOW (ref 4.22–5.81)
RDW: 15.1 % (ref 11.5–15.5)
WBC: 28.4 10*3/uL — ABNORMAL HIGH (ref 4.0–10.5)
nRBC: 0.1 % (ref 0.0–0.2)

## 2020-08-18 LAB — BPAM RBC
Blood Product Expiration Date: 202208192359
Blood Product Expiration Date: 202208192359
ISSUE DATE / TIME: 202207172207
ISSUE DATE / TIME: 202207181048
Unit Type and Rh: 5100
Unit Type and Rh: 5100

## 2020-08-18 LAB — GLUCOSE, CAPILLARY
Glucose-Capillary: 139 mg/dL — ABNORMAL HIGH (ref 70–99)
Glucose-Capillary: 134 mg/dL — ABNORMAL HIGH (ref 70–99)
Glucose-Capillary: 104 mg/dL — ABNORMAL HIGH (ref 70–99)
Glucose-Capillary: 143 mg/dL — ABNORMAL HIGH (ref 70–99)
Glucose-Capillary: 150 mg/dL — ABNORMAL HIGH (ref 70–99)
Glucose-Capillary: 120 mg/dL — ABNORMAL HIGH (ref 70–99)

## 2020-08-18 LAB — BASIC METABOLIC PANEL
Anion gap: 3 — ABNORMAL LOW (ref 5–15)
BUN: 31 mg/dL — ABNORMAL HIGH (ref 6–20)
CO2: 25 mmol/L (ref 22–32)
Calcium: 7.9 mg/dL — ABNORMAL LOW (ref 8.9–10.3)
Chloride: 114 mmol/L — ABNORMAL HIGH (ref 98–111)
Creatinine, Ser: 0.56 mg/dL — ABNORMAL LOW (ref 0.61–1.24)
GFR, Estimated: >60 mL/min (ref >60)
Glucose, Bld: 143 mg/dL — ABNORMAL HIGH (ref 70–99)
Potassium: 4 mmol/L (ref 3.5–5.1)
Sodium: 142 mmol/L (ref 135–145)

## 2020-08-18 LAB — AEROBIC/ANAEROBIC CULTURE W GRAM STAIN (SURGICAL/DEEP WOUND): Culture: NORMAL

## 2020-08-18 LAB — MAGNESIUM: Magnesium: 2.2 mg/dL (ref 1.7–2.4)

## 2020-08-18 LAB — PHOSPHORUS: Phosphorus: 2.8 mg/dL (ref 2.5–4.6)

## 2020-08-18 MED ORDER — TRAVASOL 10 % IV SOLN
INTRAVENOUS | Status: AC
  Filled 2020-08-18: qty 1044

## 2020-08-18 NOTE — Progress Notes (Addendum)
PHARMACY - TOTAL PARENTERAL NUTRITION CONSULT NOTE   Indication: Intolerance to enteral feeding  Patient Measurements: Height: 5\' 10"  (177.8 cm) Weight: 86.1 kg (189 lb 13.1 oz) IBW/kg (Calculated) : 73 TPN AdjBW (KG): 73.9 Body mass index is 27.24 kg/m. Usual Weight: 84 kg  Assessment: 39 yoM admitted on 7/13 with abdominal wall abscess, necrotizing soft tissue of abdominal wall, perforated diverticulitis with colocutaneous fistula.  Glucose / Insulin: A1C 7.4, no medications PTA - CBGs: 134-185 - Administered 23 units / 24h on resistant SSI q4h - Dexamethasone 5mg  (7/13), and Dex 10mg  (7/15) - Increased insulin in TPN on 7/18: Regular insulin 20 units  Electrolytes: WNL, except Cl up to 114.   Renal: SCr 0.56, BUN elevated at 31 Hepatic: LFTs wnl, Tbili 0.6 Triglycerides:  71 ( 7/16), 141 (7/18) Prealbumin: < 5 (7/16), 11.4 (7/18) Intake / Output; MIVF:  - UOP up to 2150 mL, NG 0 (clamped), drain down to 120 mL, colostomy 1175 mL; I/O Net -1191 mL. - IVF d/c on 7/17 GI Imaging:  7/13 CT: perforated diverticulitis with associated fistulous connection to the abdominal wall. Perforated neoplasm cannot be completely excluded. GI Surgeries / Procedures:  7/13: exploratory laparotomy, partial colectomy 7/15: exploratory laparotomy 7/16: repair abdominal wall/reexploration of abdominal wall to evaluate any further necrosis, possible debridement  Central access: PICC placed 7/16 TPN start date: 7/16   Nutritional Goals (per RD recommendation on 7/15): kCal: 2500-2700, Protein: 140-155 g, Fluid: >/= 2.5 L/day Goal TPN rate is 110 mL/hr (provides 153 g of protein and 2618 kcals per day)  Current Nutrition:  Clears > Full liquid diet TPN  Plan:  At 18:00 Advance TPN to 75 mL/hr  Electrolytes in TPN: Na 4mEq/L, K 25 mEq/L, Ca 54mEq/L, Mg 76mEq/L, and Phos 10 mmol/L, Cl:Ac max Ac Add standard MVI and trace elements to TPN Regular insulin in TPN: 20 units Continue resistant SSI  q4h and adjust as needed  Monitor TPN labs on Mon/Thurs; BMET, mag, phos with AM labs.  Follow up tolerance of liquid diet and plans to wean off TPN.  Gretta Arab PharmD, BCPS Clinical Pharmacist [facility] main pharmacy [phone] 08/18/2020 7:38 AM

## 2020-08-18 NOTE — Progress Notes (Signed)
NAME:  Roger Brennan, MRN:  [id number], DOB:  [birth year], LOS: 6 ADMISSION DATE:  08/12/2020, CONSULTATION DATE:  08/12/2020 REFERRING MD:  Kinsinger - CCS, CHIEF COMPLAINT:  Abdominal pain, colon perforation   Brief Narrative:  60 year old man with PMHx significant for remote inguinal hernia repair and diverticulosis who presented to [facility] ED 7/13 with LLQ abdominal pain. CT A/P demonstrated perforated colonic diverticulum with abscess formation and fistulization to abdominal wall. Patient was taken emergently to the OR for ex-lap, debridement of necrotizing soft tissue infection and partial colectomy with end colostomy creation.   On POD#2, patient was noted to have drainage of enteric contents from the wound prompting emergent ex-lap which revealed recurrent peritonitis; patient underwent LOA and SBR with reanastomosis and enterorrhaphy x 2 and debridement of necrotic fat/fascia. Patient was taken for additional debridement of abdominal wall 7/16.  PCCM following for critical care/ICU needs.  Pertinent Medical History:  Inguinal hernia repair (distant)   Significant Hospital Events:  7/13 Presented to ED with worsening LLQ abdominal pain. 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 On vanc/cefepime/flagyl pre op.  7/15 Enteric drainage from the wound >> emergent exploration demonstrated recurrent peritonitis, LOA ,Small bowel resection with anastomosis, enterrorhaphy x 2, debridement of 200 cm^2 of necrotic fat and fascia of abdominal wall 7/16 debridement of abdominal wall 7/18 NGT removed, tolerating CLD. Ostomy output improved, but melenic. Hgb 7.3 (7.0) s/p 1U PRBCs, repeat 6.9. Additional 1U PRBCs given with Hgb bump to 8.3.  Interim History / Subjective:  No significant events overnight Feeling generally well this morning Denies fever/chills CP/SOB, n/v Denies significant abdominal pain, only sore with dressing  changes LE swelling feels improved with feel elevated No nausea since NGT removed, tolerating CLD  Objective:  Blood pressure (!) 147/78, pulse 75, temperature 97.8 F (36.6 C), temperature source Oral, resp. rate 14, height 5\' 10"  (1.778 m), weight 86.1 kg, SpO2 98 %.    FiO2 (%):  [21 %] 21 %   Intake/Output Summary (Last 24 hours) at 08/18/2020 0754 Last data filed at 08/18/2020 9563 Gross per 24 hour  Intake 2253.55 ml  Output 2795 ml  Net -541.45 ml    Filed Weights   08/12/20 0958 08/14/20 1420 08/15/20 0445  Weight: 73.9 kg 83.8 kg 86.1 kg   Physical Examination: General: Acutely ill-appearing middle-aged man in NAD. HEENT: [place]/AT, anicteric sclera, PERRL, slightly dry mucous membranes. Neuro: Awake, oriented x 4. Responds to verbal stimuli. Following commands consistently. Moves all 4 extremities spontaneously. CV: RRR, no m/g/r. PULM: Breathing even and unlabored on RA. Lung fields CTAB anteriorly. GI: Soft, nondistended. Appropriately TTP postoperatively. Hypoactive bowel sounds. Ostomy in place with dark brown stool. Wounds covered with gauze dressing, scant drainage. Extremities: Trace symmetric BLE edema noted. Skin: Warm/dry, wounds covered by dressings, no rashes. See Media tab for wound photo.  Resolved Hospital Problem List:  Acute metabolic encephalopathy Severe sepsis -Pressors stopped 7/14 Lactic acidosis  AKI, POA  Hyponatremia, mild   Assessment & Plan:   Feculent peritonitis Colonic perforation with abscess and fistula formation Necrotizing SSTI of abdominal wall  S/p ex lap debridement partial colectomy and colostomy 7/13, suspected perf diverticulitis; s/p repeat ex-lap with LOA/SBR POD#2 (7/15), s/p abdominal wall debridement (7/16). - Management per CCS - NGT removed 7/18 per surgery, tolerating well - Diet advancement per CCS - Continue Zosyn, Eraxis - PCA for pain control  Acute blood loss anemia Bloody NGT output Melena Ongoing  bloody  NGT and melenic stools with associated Hgb drift. - Trend H&H - Transfuse for Hgb < 7.0 - Last transfusion: 1U PRBCs 7/18 (Hgb 8.3 from 6.9) - PPI BID  Protein calorie malnutrition -albumin has dropped acutely to 1.7 - Continue TPN - Appreciate pharmacy assistance  Best Practice:  Diet/type: NPO TNA DVT prophylaxis: SCD GI prophylaxis: PPI Lines: Central line - PICC Foley:  N/A Code Status:  full code Last date of multidisciplinary goals of care discussion [Per primary]  Critical Care Time: N/A   Rhae Lerner [facility] 08/18/20 7:54 AM  Please see [email or web address] for pager details.  From 7A-7P if no response, please call [phone] After hours, please call ELink [phone]

## 2020-08-18 NOTE — Progress Notes (Signed)
3 Days Post-Op   Subjective/Chief Complaint: No complaints. Tolerated clears   Objective: Vital signs in last 24 hours: Temp:  [97.5 F (36.4 C)-98.2 F (36.8 C)] 97.8 F (36.6 C) (07/19 0400) Pulse Rate:  [74-104] 75 (07/19 0600) Resp:  [8-22] 12 (07/19 0600) BP: (133-164)/(68-116) 147/78 (07/19 0600) SpO2:  [93 %-100 %] 98 % (07/19 0600) FiO2 (%):  [21 %] 21 % (07/19 0452) Last BM Date: 08/17/20 (colostomy)  Intake/Output from previous day: 07/18 0701 - 07/19 0700 In: 2253.6 [I.V.:1157; Blood:392.5; IV Piggyback:704.1] Out: 9381 [Urine:2150; Drains:120; WEXHB:7169] Intake/Output this shift: No intake/output data recorded.  General appearance: alert and cooperative Resp: clear to auscultation bilaterally Cardio: regular rate and rhythm GI: soft, nontender. Ostomy productive. Wounds clean  Lab Results:  Recent Labs    08/16/20 1748 08/17/20 0303 08/17/20 0851 08/17/20 1554  WBC 27.3* 21.8*  --   --   HGB 7.0* 7.3* 6.9* 8.3*  HCT 22.0* 22.6* 20.7* 25.0*  PLT 404* 316  --   --    BMET Recent Labs    08/17/20 0303 08/18/20 0417  NA 140 142  K 4.4 4.0  CL 112* 114*  CO2 25 25  GLUCOSE 274* 143*  BUN 30* 31*  CREATININE 0.66 0.56*  CALCIUM 7.5* 7.9*   PT/INR No results for input(s): LABPROT, INR in the last 72 hours. ABG No results for input(s): PHART, HCO3 in the last 72 hours.  Invalid input(s): PCO2, PO2  Studies/Results: No results found.  Anti-infectives: Anti-infectives (From admission, onward)    Start     Dose/Rate Route Frequency Ordered Stop   08/15/20 1000  anidulafungin (ERAXIS) 100 mg in sodium chloride 0.9 % 100 mL IVPB        100 mg 78 mL/hr over 100 Minutes Intravenous Every 24 hours 08/14/20 1349     08/14/20 1600  anidulafungin (ERAXIS) 200 mg in sodium chloride 0.9 % 200 mL IVPB        200 mg 78 mL/hr over 200 Minutes Intravenous  Once 08/14/20 1349 08/14/20 2000   08/14/20 1000  vancomycin (VANCOREADY) IVPB 1500 mg/300 mL   Status:  Discontinued        1,500 mg 150 mL/hr over 120 Minutes Intravenous Every 24 hours 08/13/20 1009 08/14/20 0901   08/13/20 1000  vancomycin (VANCOREADY) IVPB 1250 mg/250 mL  Status:  Discontinued        1,250 mg 166.7 mL/hr over 90 Minutes Intravenous Every 24 hours 08/12/20 1910 08/13/20 1009   08/12/20 2200  piperacillin-tazobactam (ZOSYN) IVPB 3.375 g  Status:  Discontinued        3.375 g 12.5 mL/hr over 240 Minutes Intravenous Every 8 hours 08/12/20 1904 08/12/20 1941   08/12/20 2000  clindamycin (CLEOCIN) IVPB 600 mg  Status:  Discontinued        600 mg 100 mL/hr over 30 Minutes Intravenous Every 8 hours 08/12/20 1757 08/17/20 0844   08/12/20 1830  piperacillin-tazobactam (ZOSYN) IVPB 3.375 g        3.375 g 12.5 mL/hr over 240 Minutes Intravenous Every 8 hours 08/12/20 1811     08/12/20 1015  vancomycin (VANCOREADY) IVPB 1500 mg/300 mL        1,500 mg 150 mL/hr over 120 Minutes Intravenous  Once 08/12/20 1007 08/12/20 1330   08/12/20 1000  ceFEPIme (MAXIPIME) 2 g in sodium chloride 0.9 % 100 mL IVPB        2 g 200 mL/hr over 30 Minutes Intravenous  Once 08/12/20 1000 08/12/20  1121   08/12/20 1000  metroNIDAZOLE (FLAGYL) IVPB 500 mg        500 mg 100 mL/hr over 60 Minutes Intravenous  Once 08/12/20 1000 08/12/20 1226   08/12/20 1000  vancomycin (VANCOCIN) IVPB 1000 mg/200 mL premix  Status:  Discontinued        1,000 mg 200 mL/hr over 60 Minutes Intravenous  Once 08/12/20 1000 08/12/20 1007       Assessment/Plan: s/p Procedure(s): REPAIR ABDOMINAL WALL/REEXPLORATION OF ABDOMINAL WALL, IRRIGATION AND DEBRIDEMENT (N/A) Advance diet. 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 Kieth Brightly F/E/N: Continue tpn. Advance to fulls. Wean tpn tomorrow ID: Continue zosyn day 6 and eraxis. D/c clinda per pharmacy VTE prophylaxix. Scd's and  sq heparin  LOS: 6 days    Roger Brennan 08/18/2020

## 2020-08-18 NOTE — Evaluation (Signed)
Occupational Therapy Evaluation Patient Details Name: Roger Brennan MRN: [id number] DOB: [birth year] Today's Date: 08/18/2020    History of Present Illness Patient is 60 yo  old male who presented to [facility] 08/12/20 at the direction of PCP with LLQ abdominal pain and induration for the last 2-3 weeks, 20 pound weight loss.  CT-Colon perforation with abscess and fistulization to abdominal wall .  S/P drainage and debridement of left lower quadrant abdominal wall ,. partial colectomy with end colostomy , Splenic mobilization on 08/12/20 emergently. 7/15 intestinal drainage from the wound >> emergent exploration; recurrent peritonitis, LOA ,Small bowel resection with anastomosis, enterrorhaphy x 2, debridement of 200 cm^2 of necrotic fat and fascia of abdominal wall; 7/16 debridement of abdominal wall   Clinical Impression   Patient appears somewhat apprehensive for mobility "in my mind it shouldn't hurt." Educate patient that with multiple abdominal surgeries there will likely be discomfort but will educate on log roll technique, bracing abdomen with pillow and encourage use of PCA prior to therapy. Patient needs increased time for all mobility, cues for log roll technique and mod x2 to sit upright from side lying position. Patient becomes frustrated "this is pathetic" provide encouragement/reassurance, with two attempts able to power up to standing with mod x2 and blocking of bilateral knees due to initial buckling. Once steady patient able to take side steps to head of bed with bilateral UE support and mod x2, fatigues quickly and asks to sit. Continue to recommend inpatient rehab at D/C, has strong social support at home and was very independent at baseline.     Follow Up Recommendations  CIR    Equipment Recommendations  Tub/shower seat       Precautions / Restrictions Precautions Precautions: Fall Precaution Comments: colostomy, R JP drain, mutiple lines, bandages over full abdomen, TPN, PCA       Mobility Bed Mobility Overal bed mobility: Needs Assistance Bed Mobility: Rolling;Sidelying to Sit;Sit to Sidelying Rolling: +2 for physical assistance;+2 for safety/equipment;Min assist Sidelying to sit: Mod assist;+2 for physical assistance;HOB elevated     Sit to sidelying: Mod assist;+2 for physical assistance;+2 for safety/equipment General bed mobility comments: with cues for sequencing patient min A x2 to roll onto his side, mod x2 for trunk support to sit upright. at end of session mod x2 to guide trunk and lift legs onto bed    Transfers Overall transfer level: Needs assistance Equipment used: 2 person hand held assist Transfers: Sit to/from Stand Sit to Stand: Mod assist;+2 physical assistance;+2 safety/equipment         General transfer comment: initial lower extremity buckling needing therapy to block knees to obtain static stand. Once obtained able to take few side steps to head of bed with bilateral hand held assistance. limited standing tolerance "I need to sit"    Balance Overall balance assessment: Needs assistance Sitting-balance support: Feet supported Sitting balance-Leahy Scale: Fair     Standing balance support: Bilateral upper extremity supported Standing balance-Leahy Scale: Poor Standing balance comment: reliant on UE support and mod x2                           ADL either performed or assessed with clinical judgement   ADL Overall ADL's : Needs assistance/impaired                     Lower Body Dressing: Total assistance;Bed level Lower Body Dressing Details (indicate cue type and reason): to don  socks Toilet Transfer: Moderate assistance;+2 for physical assistance;+2 for safety/equipment;Cueing for safety;Cueing for sequencing Toilet Transfer Details (indicate cue type and reason): needing two attempts to power up to standing from edge of bed with lower extremity weakness/initial buckling needing therapy to block knees. Once  static standing achieved patient able to side step to head of bed with bilateral hand held assist and mod x2         Functional mobility during ADLs: Moderate assistance;+2 for physical assistance;+2 for safety/equipment General ADL Comments: patient presenting with decreased strength and overall activity tolerance needing increased time for all mobility      Pertinent Vitals/Pain Pain Assessment: Faces Faces Pain Scale: Hurts little more Pain Location: abdomen (with transfers) Pain Descriptors / Indicators: Discomfort;Grimacing;Guarding Pain Intervention(s): PCA encouraged;Monitored during session;Limited activity within patient's tolerance              Cognition Arousal/Alertness: Awake/alert Behavior During Therapy: Anxious;Restless;Flat affect Overall Cognitive Status: Difficult to assess                                 General Comments: patient seems  different than previous encounter, more anxious about  mobility   General Comments  VSS on room air            OT Goals(Current goals can be found in the care plan section) Acute Rehab OT Goals Patient Stated Goal: return to baseline OT Goal Formulation: With patient/family Time For Goal Achievement: 08/27/20 Potential to Achieve Goals: Good ADL Goals Pt Will Perform Upper Body Dressing: sitting;with set-up Pt Will Perform Lower Body Dressing: with supervision;sit to/from stand;sitting/lateral leans (A/E as needed) Pt Will Transfer to Toilet: with supervision;ambulating Pt Will Perform Toileting - Clothing Manipulation and hygiene: with supervision;sit to/from stand;sitting/lateral leans  OT Frequency: Min 2X/week           Co-evaluation PT/OT/SLP Co-Evaluation/Treatment: Yes Reason for Co-Treatment: Complexity of the patient's impairments (multi-system involvement);For patient/therapist safety PT goals addressed during session: Mobility/safety with mobility OT goals addressed during session:  ADL's and self-care      AM-PAC OT "6 Clicks" Daily Activity     Outcome Measure Help from another person eating meals?: A Little Help from another person taking care of personal grooming?: A Little Help from another person toileting, which includes using toliet, bedpan, or urinal?: Total Help from another person bathing (including washing, rinsing, drying)?: A Lot Help from another person to put on and taking off regular upper body clothing?: A Lot Help from another person to put on and taking off regular lower body clothing?: Total 6 Click Score: 12   End of Session Nurse Communication: Mobility status  Activity Tolerance: Patient tolerated treatment well Patient left: in bed;with call bell/phone within reach;with family/visitor present  OT Visit Diagnosis: Other abnormalities of gait and mobility (R26.89);Pain Pain - part of body:  (abdomen)                Time: 1224-8250 OT Time Calculation (min): 29 min Charges:  OT General Charges $OT Visit: 1 Visit OT Treatments $Self Care/Home Management : 8-22 mins  Delbert Phenix OT OT pager: [phone]  Rosemary Holms 08/18/2020, 2:31 PM

## 2020-08-18 NOTE — Progress Notes (Signed)
Physical Therapy Treatment Patient Details Name: Roger Brennan MRN: [id number] DOB: [birth year] Today's Date: 08/18/2020    History of Present Illness Patient is 60 yo  old male who presented to [facility] 08/12/20 at the direction of PCP with LLQ abdominal pain and induration for the last 2-3 weeks, 20 pound weight loss.  CT-Colon perforation with abscess and fistulization to abdominal wall .  S/P drainage and debridement of left lower quadrant abdominal wall ,. partial colectomy with end colostomy , Splenic mobilization on 08/12/20 emergently. 7/15 intestinal drainage from the wound >> emergent exploration; recurrent peritonitis, LOA ,Small bowel resection with anastomosis, enterrorhaphy x 2, debridement of 200 cm^2 of necrotic fat and fascia of abdominal wall; 7/16 debridement of abdominal wall    PT Comments    Patient resting in bed, wife present and encouraging. Patient  required 2 mod assistance to sit onto bed edge and stand  after several attempts. Able  to take small side steps along bed. Patient not willing to get to recliner stating that "they don't look very comfortable."  Encouraged patient that  OOB is extremely important. Continue PT.   Follow Up Recommendations  Home health PT     Equipment Recommendations  Rolling walker with 5" wheels    Recommendations for Other Services       Precautions / Restrictions Precautions Precautions: Fall Precaution Comments: colostomy, R JP drain, mutiple lines, bandages over full abdomen, TPN, PCA    Mobility  Bed Mobility Overal bed mobility: Needs Assistance Bed Mobility: Sidelying to Sit;Sit to Sidelying;Rolling Rolling: Mod assist;+2 for physical assistance;+2 for safety/equipment Sidelying to sit: +2 for physical assistance;+2 for safety/equipment;Mod assist     Sit to sidelying: +2 for safety/equipment;+2 for physical assistance;Max assist General bed mobility comments: Multimodal cues for log rolling with flexed  knees, legs over  bed edge. Mod A of 2  to move to sitting upright from sidelying, use of bed pad to assist trunk.Cues  for return to sidelying, assist with legs and trunk.    Transfers Overall transfer level: Needs assistance Equipment used: 2 person hand held assist Transfers: Sit to/from Stand Sit to Stand: Mod assist;+2 physical assistance;+2 safety/equipment         General transfer comment: Patient  stood after 2  trials to stand from bed to power up with 2 mod assisting.  Side seps x 4 along bed.  Ambulation/Gait                 Stairs             Wheelchair Mobility    Modified Rankin (Stroke Patients Only)       Balance Overall balance assessment: Needs assistance Sitting-balance support: Feet supported Sitting balance-Leahy Scale: Fair Sitting balance - Comments: supporting self with BUEs at edge of bed - propping for support not balance   Standing balance support: Bilateral upper extremity supported Standing balance-Leahy Scale: Poor Standing balance comment: reliant on UE support and mod x2                            Cognition Arousal/Alertness: Awake/alert Behavior During Therapy: Anxious;Restless;Flat affect Overall Cognitive Status: Difficult to assess                                 General Comments: patient seems  different than previous encounter, more anxious about  mobility  Exercises      General Comments General comments (skin integrity, edema, etc.): VSS on room air      Pertinent Vitals/Pain Pain Assessment: Faces Faces Pain Scale: Hurts little more Pain Location: abdomen (with transfers) Pain Descriptors / Indicators: Discomfort;Grimacing;Guarding Pain Intervention(s): PCA encouraged;Monitored during session;Limited activity within patient's tolerance    Home Living                      Prior Function            PT Goals (current goals can now be found in the care plan section) Acute Rehab PT  Goals Patient Stated Goal: return to baseline Progress towards PT goals: Progressing toward goals    Frequency    Min 3X/week      PT Plan Current plan remains appropriate    Co-evaluation PT/OT/SLP Co-Evaluation/Treatment: Yes Reason for Co-Treatment: Complexity of the patient's impairments (multi-system involvement);For patient/therapist safety PT goals addressed during session: Mobility/safety with mobility OT goals addressed during session: ADL's and self-care      AM-PAC PT "6 Clicks" Mobility   Outcome Measure  Help needed turning from your back to your side while in a flat bed without using bedrails?: A Lot Help needed moving from lying on your back to sitting on the side of a flat bed without using bedrails?: A Lot Help needed moving to and from a bed to a chair (including a wheelchair)?: A Lot Help needed standing up from a chair using your arms (e.g., wheelchair or bedside chair)?: A Lot Help needed to walk in hospital room?: Total Help needed climbing 3-5 steps with a railing? : Total 6 Click Score: 10    End of Session Equipment Utilized During Treatment: Oxygen Activity Tolerance: Patient tolerated treatment well Patient left: in bed;with call bell/phone within reach;with nursing/sitter in room Nurse Communication: Mobility status PT Visit Diagnosis: Unsteadiness on feet (R26.81);Difficulty in walking, not elsewhere classified (R26.2);Muscle weakness (generalized) (M62.81);Pain     Time: 2637-8588 PT Time Calculation (min) (ACUTE ONLY): 28 min  Charges:  $Therapeutic Activity: 8-22 mins                    Tresa Endo PT Acute Rehabilitation Services Pager [phone] Office [phone]   Claretha Cooper 08/18/2020, 2:34 PM

## 2020-08-19 LAB — MAGNESIUM: Magnesium: 1.8 mg/dL (ref 1.7–2.4)

## 2020-08-19 LAB — GLUCOSE, CAPILLARY
Glucose-Capillary: 110 mg/dL — ABNORMAL HIGH (ref 70–99)
Glucose-Capillary: 118 mg/dL — ABNORMAL HIGH (ref 70–99)
Glucose-Capillary: 133 mg/dL — ABNORMAL HIGH (ref 70–99)
Glucose-Capillary: 147 mg/dL — ABNORMAL HIGH (ref 70–99)
Glucose-Capillary: 148 mg/dL — ABNORMAL HIGH (ref 70–99)
Glucose-Capillary: 150 mg/dL — ABNORMAL HIGH (ref 70–99)
Glucose-Capillary: 62 mg/dL — ABNORMAL LOW (ref 70–99)
Glucose-Capillary: 66 mg/dL — ABNORMAL LOW (ref 70–99)
Glucose-Capillary: 68 mg/dL — ABNORMAL LOW (ref 70–99)
Glucose-Capillary: 80 mg/dL (ref 70–99)

## 2020-08-19 LAB — CREATININE, SERUM
Creatinine, Ser: 0.55 mg/dL — ABNORMAL LOW (ref 0.61–1.24)
GFR, Estimated: >60 mL/min (ref >60)

## 2020-08-19 LAB — BASIC METABOLIC PANEL
Anion gap: 7 (ref 5–15)
BUN: 21 mg/dL — ABNORMAL HIGH (ref 6–20)
CO2: 23 mmol/L (ref 22–32)
Calcium: 7.9 mg/dL — ABNORMAL LOW (ref 8.9–10.3)
Chloride: 105 mmol/L (ref 98–111)
Creatinine, Ser: 0.57 mg/dL — ABNORMAL LOW (ref 0.61–1.24)
GFR, Estimated: >60 mL/min (ref >60)
Glucose, Bld: 184 mg/dL — ABNORMAL HIGH (ref 70–99)
Potassium: 3.8 mmol/L (ref 3.5–5.1)
Sodium: 135 mmol/L (ref 135–145)

## 2020-08-19 LAB — PHOSPHORUS: Phosphorus: 3.1 mg/dL (ref 2.5–4.6)

## 2020-08-19 MED ORDER — PROSOURCE PLUS PO LIQD
30.0000 mL | Freq: Every day | ORAL | Status: DC
  Administered 2020-08-20: 30 mL via ORAL
  Filled 2020-08-19: qty 30

## 2020-08-19 MED ORDER — ENSURE ENLIVE PO LIQD
237.0000 mL | ORAL | Status: DC
  Administered 2020-08-19: 237 mL via ORAL

## 2020-08-19 MED ORDER — TRAVASOL 10 % IV SOLN: INTRAVENOUS | Status: DC

## 2020-08-19 MED ORDER — BOOST / RESOURCE BREEZE PO LIQD CUSTOM
1.0000 | ORAL | Status: DC
  Administered 2020-08-20 – 2020-08-21 (×2): 1 via ORAL

## 2020-08-19 MED ORDER — DEXTROSE 50 % IV SOLN
INTRAVENOUS | Status: AC
  Administered 2020-08-19: 12.5 g via INTRAVENOUS
  Filled 2020-08-19: qty 50

## 2020-08-19 MED ORDER — INSULIN ASPART 100 UNIT/ML IJ SOLN
20.0000 [IU] | Freq: Once | INTRAMUSCULAR | Status: AC
  Administered 2020-08-19: 20 [IU] via SUBCUTANEOUS

## 2020-08-19 MED ORDER — GLUCOSE 4 G PO CHEW
CHEWABLE_TABLET | ORAL | Status: AC
  Filled 2020-08-19: qty 1

## 2020-08-19 MED ORDER — DEXTROSE 50 % IV SOLN: 12.5000 g | INTRAVENOUS | Status: AC

## 2020-08-19 NOTE — Progress Notes (Signed)
Physical Therapy Treatment Patient Details Name: Roger Brennan MRN: [id number] DOB: Sep 27, 1960 Today's Date: 08/19/2020    History of Present Illness Patient is 60 yo  old male who presented to [facility] 08/12/20 at the direction of PCP with LLQ abdominal pain and induration for the last 2-3 weeks, 20 pound weight loss.  CT-Colon perforation with abscess and fistulization to abdominal wall .  S/P drainage and debridement of left lower quadrant abdominal wall ,. partial colectomy with end colostomy , Splenic mobilization on 08/12/20 emergently. 7/15 intestinal drainage from the wound >> emergent exploration; recurrent peritonitis, LOA ,Small bowel resection with anastomosis, enterrorhaphy x 2, debridement of 200 cm^2 of necrotic fat and fascia of abdominal wall; 7/16 debridement of abdominal wall    PT Comments    The patient  consented to getting  OOB  at 1PM after checking oh him earlier. The patient  appears less anxious. Patient required min/mod assist and  cues for bed mobility. Stood from bed at Rw with min assist and verbal cues for hand placement, bed raised. Patient  took ` 10 steps using RW. Patient's wife present. HR 109. On RA with SPO2 >91%.  Continue PT for  increased ambulation/mobility.  Follow Up Recommendations  CIR     Equipment Recommendations   (TBA)    Recommendations for Other Services       Precautions / Restrictions Precautions Precaution Comments: colostomy, R JP drain, mutiple lines, bandages over full abdomen, TPN, PCA    Mobility  Bed Mobility     Rolling: Min assist Sidelying to sit: Mod assist       General bed mobility comments: multimodal cues to  roll, sidelying to sitting for abdomen precautions.    Transfers Overall transfer level: Needs assistance Equipment used: Rolling walker (2 wheeled) Transfers: Sit to/from Stand Sit to Stand: Min assist;+2 safety/equipment         General transfer comment: multimodal cues  for hand placement/feet  position to stand from bed, bed raised. Patient steady upon rising with RW  Ambulation/Gait Ambulation/Gait assistance: Min assist;+2 safety/equipment Gait Distance (Feet): 10 Feet Assistive device: Rolling walker (2 wheeled) Gait Pattern/deviations: Step-to pattern;Step-through pattern Gait velocity: decr   General Gait Details: slow to step but steady with RW.   Stairs             Wheelchair Mobility    Modified Rankin (Stroke Patients Only)       Balance Overall balance assessment: Needs assistance Sitting-balance support: Feet supported Sitting balance-Leahy Scale: Fair Sitting balance - Comments: supporting self with BUEs at edge of bed -   Standing balance support: Bilateral upper extremity supported Standing balance-Leahy Scale: Fair Standing balance comment: reliant on UE support an Rw                            Cognition Arousal/Alertness: Awake/alert Behavior During Therapy: Anxious                                   General Comments: less anxious today      Exercises      General Comments        Pertinent Vitals/Pain Pain Score: 4  Pain Location: abdomen (with transfers) Pain Descriptors / Indicators: Discomfort;Grimacing;Guarding Pain Intervention(s): Monitored during session;PCA encouraged;Patient requesting pain meds-RN notified    Home Living  Prior Function            PT Goals (current goals can now be found in the care plan section) Progress towards PT goals: Progressing toward goals    Frequency    Min 3X/week      PT Plan Current plan remains appropriate;Discharge plan needs to be updated    Co-evaluation PT/OT/SLP Co-Evaluation/Treatment: Yes Reason for Co-Treatment: For patient/therapist safety;Complexity of the patient's impairments (multi-system involvement) PT goals addressed during session: Mobility/safety with mobility        AM-PAC PT "6 Clicks"  Mobility   Outcome Measure  Help needed turning from your back to your side while in a flat bed without using bedrails?: A Little Help needed moving from lying on your back to sitting on the side of a flat bed without using bedrails?: A Little Help needed moving to and from a bed to a chair (including a wheelchair)?: A Lot Help needed standing up from a chair using your arms (e.g., wheelchair or bedside chair)?: A Lot Help needed to walk in hospital room?: A Lot Help needed climbing 3-5 steps with a railing? : Total 6 Click Score: 13    End of Session   Activity Tolerance: Patient tolerated treatment well Patient left: in chair;with call bell/phone within reach;with family/visitor present Nurse Communication: Mobility status PT Visit Diagnosis: Unsteadiness on feet (R26.81);Difficulty in walking, not elsewhere classified (R26.2);Muscle weakness (generalized) (M62.81);Pain     Time: 1254-1315 PT Time Calculation (min) (ACUTE ONLY): 21 min  Charges:  $Gait Training: 8-22 mins                     [place] Pager [phone] Office [phone]    Claretha Cooper 08/19/2020, 1:58 PM

## 2020-08-19 NOTE — Consult Note (Signed)
Love Nurse ostomy follow up Current pouch is intact with good seal.  WOC team plans to change tomorrow.  Stoma type/location: Stoma is red and viable when visualized through pouch Output: mod amt dark brown liquid stol Ostomy pouching: 2pc.  Education provided:  Pt remains in ICU at this time but is stable and alert. Reviewed pouching routines and pt was able to open and close velcro to empty.  6 sets of wafers Kellie Simmering # 2) pouches Kellie Simmering # [phone]) and barrier rings Kellie Simmering # [phone]) left at the bedside for staff nurse use, along with educational materials.  Enrolled patient in Post Start Discharge program: Yes, previously [place] team will continue to follow. Thank-you,  Julien Girt MSN, [place], [place], [place], [place]

## 2020-08-19 NOTE — Progress Notes (Signed)
Inpatient Rehab Admissions Coordinator:   Per OT recs and updated PT recommendations, pt was screened for appropriateness for CIR. At this time we are recommending a consult and I will place an order per our protocol.   Shann Medal, PT, DPT Admissions Coordinator [phone] 08/19/20  3:25 PM

## 2020-08-19 NOTE — Progress Notes (Signed)
4 Days Post-Op   Subjective/Chief Complaint: No complaints. Tolerated fulls   Objective: Vital signs in last 24 hours: Temp:  [97.6 F (36.4 C)-97.8 F (36.6 C)] 97.8 F (36.6 C) (07/20 0800) Pulse Rate:  [70-108] 72 (07/20 0600) Resp:  [10-22] 12 (07/20 0831) BP: (142-170)/(66-122) 144/83 (07/20 0600) SpO2:  [96 %-100 %] 98 % (07/20 0831) FiO2 (%):  [21 %] 21 % (07/20 0406) Last BM Date: 08/19/20  Intake/Output from previous day: 07/19 0701 - 07/20 0700 In: 2049 [I.V.:1544.4; IV Piggyback:504.6] Out: 2125 [Urine:1600; Drains:125; Stool:400] Intake/Output this shift: Total I/O In: -  Out: 350 [Urine:350]  General appearance: alert and cooperative Resp: clear to auscultation bilaterally Cardio: regular rate and rhythm GI: soft, nontender. Wounds clean. Ostomy productive  Lab Results:  Recent Labs    08/17/20 0303 08/17/20 0851 08/17/20 1554 08/18/20 0833  WBC 21.8*  --   --  28.4*  HGB 7.3*   < > 8.3* 8.1*  HCT 22.6*   < > 25.0* 24.8*  PLT 316  --   --  289   < > = values in this interval not displayed.   BMET Recent Labs    08/18/20 0417 08/19/20 0500  NA 142 135  K 4.0 3.8  CL 114* 105  CO2 25 23  GLUCOSE 143* 184*  BUN 31* 21*  CREATININE 0.56* 0.57*  0.55*  CALCIUM 7.9* 7.9*   PT/INR No results for input(s): LABPROT, INR in the last 72 hours. ABG No results for input(s): PHART, HCO3 in the last 72 hours.  Invalid input(s): PCO2, PO2  Studies/Results: No results found.  Anti-infectives: Anti-infectives (From admission, onward)    Start     Dose/Rate Route Frequency Ordered Stop   08/15/20 1000  anidulafungin (ERAXIS) 100 mg in sodium chloride 0.9 % 100 mL IVPB        100 mg 78 mL/hr over 100 Minutes Intravenous Every 24 hours 08/14/20 1349     08/14/20 1600  anidulafungin (ERAXIS) 200 mg in sodium chloride 0.9 % 200 mL IVPB        200 mg 78 mL/hr over 200 Minutes Intravenous  Once 08/14/20 1349 08/14/20 2000   08/14/20 1000   vancomycin (VANCOREADY) IVPB 1500 mg/300 mL  Status:  Discontinued        1,500 mg 150 mL/hr over 120 Minutes Intravenous Every 24 hours 08/13/20 1009 08/14/20 0901   08/13/20 1000  vancomycin (VANCOREADY) IVPB 1250 mg/250 mL  Status:  Discontinued        1,250 mg 166.7 mL/hr over 90 Minutes Intravenous Every 24 hours 08/12/20 1910 08/13/20 1009   08/12/20 2200  piperacillin-tazobactam (ZOSYN) IVPB 3.375 g  Status:  Discontinued        3.375 g 12.5 mL/hr over 240 Minutes Intravenous Every 8 hours 08/12/20 1904 08/12/20 1941   08/12/20 2000  clindamycin (CLEOCIN) IVPB 600 mg  Status:  Discontinued        600 mg 100 mL/hr over 30 Minutes Intravenous Every 8 hours 08/12/20 1757 08/17/20 0844   08/12/20 1830  piperacillin-tazobactam (ZOSYN) IVPB 3.375 g        3.375 g 12.5 mL/hr over 240 Minutes Intravenous Every 8 hours 08/12/20 1811     08/12/20 1015  vancomycin (VANCOREADY) IVPB 1500 mg/300 mL        1,500 mg 150 mL/hr over 120 Minutes Intravenous  Once 08/12/20 1007 08/12/20 1330   08/12/20 1000  ceFEPIme (MAXIPIME) 2 g in sodium chloride 0.9 % 100 mL  IVPB        2 g 200 mL/hr over 30 Minutes Intravenous  Once 08/12/20 1000 08/12/20 1121   08/12/20 1000  metroNIDAZOLE (FLAGYL) IVPB 500 mg        500 mg 100 mL/hr over 60 Minutes Intravenous  Once 08/12/20 1000 08/12/20 1226   08/12/20 1000  vancomycin (VANCOCIN) IVPB 1000 mg/200 mL premix  Status:  Discontinued        1,000 mg 200 mL/hr over 60 Minutes Intravenous  Once 08/12/20 1000 08/12/20 1007       Assessment/Plan: s/p Procedure(s): REPAIR ABDOMINAL WALL/REEXPLORATION OF ABDOMINAL WALL, IRRIGATION AND DEBRIDEMENT (N/A) Advance diet. 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 Kieth Brightly F/E/N: Continue tpn. Advance to soft. Wean tpn ID: Continue zosyn day 6 and eraxis. D/c clinda  per pharmacy VTE prophylaxix. Scd's and sq heparin  LOS: 7 days    Autumn Messing III 08/19/2020

## 2020-08-19 NOTE — Progress Notes (Signed)
Occupational Therapy Progress Note  Patient still needing increased time but does appear less anxious today. Agreeable to get up to chair. Patient needing multimodal cues and overall mod A for log roll technique. Did not observe LE buckling this session, able to power up to standing with min A x2 and cues for hand placement. Patient tolerate ~28ft ambulation with rolling walker and min A x1 with chair follow before "I need to sit." Continue to recommend inpatient rehab as patient demonstrates decreased activity tolerance and independence with self care tasks compared to baseline, acute OT to follow.    08/19/20 1400  OT Visit Information  Last OT Received On 08/19/20  Assistance Needed +2  PT/OT/SLP Co-Evaluation/Treatment Yes  Reason for Co-Treatment For patient/therapist safety;To address functional/ADL transfers;Complexity of the patient's impairments (multi-system involvement)  PT goals addressed during session Mobility/safety with mobility  OT goals addressed during session ADL's and self-care  History of Present Illness Patient is 60 yo  old male who presented to [facility] 08/12/20 at the direction of PCP with LLQ abdominal pain and induration for the last 2-3 weeks, 20 pound weight loss.  CT-Colon perforation with abscess and fistulization to abdominal wall .  S/P drainage and debridement of left lower quadrant abdominal wall ,. partial colectomy with end colostomy , Splenic mobilization on 08/12/20 emergently. 7/15 intestinal drainage from the wound >> emergent exploration; recurrent peritonitis, LOA ,Small bowel resection with anastomosis, enterrorhaphy x 2, debridement of 200 cm^2 of necrotic fat and fascia of abdominal wall; 7/16 debridement of abdominal wall  Precautions  Precautions Fall  Precaution Comments colostomy, R JP drain, mutiple lines, bandages over full abdomen, TPN, PCA  Pain Assessment  Pain Assessment Faces  Faces Pain Scale 2  Pain Location abdomen (with transfers)  Pain  Descriptors / Indicators Discomfort;Grimacing;Guarding  Pain Intervention(s) Monitored during session;PCA encouraged  Cognition  Arousal/Alertness Awake/alert  Behavior During Therapy Anxious  Overall Cognitive Status Within Functional Limits for tasks assessed  General Comments patient still needing increased time however does appear less anxious this session compared to yesterday  ADL  Overall ADL's  Needs assistance/impaired  Lower Body Dressing Total assistance;Bed level  Lower Body Dressing Details (indicate cue type and reason) to don socks  Toilet Transfer Minimal assistance;+2 for safety/equipment;+2 for physical assistance;Cueing for safety;Cueing for sequencing;RW  Toilet Transfer Details (indicate cue type and reason) head of bed elevated, able to power up to standing on first attempt this session and min x2. needs cues for hand placement to push from bed vs pull on walker  Functional mobility during ADLs Minimal assistance;+2 for physical assistance;+2 for safety/equipment;Cueing for safety;Cueing for sequencing;Rolling walker  General ADL Comments patient able to ambulate ~3 ft before "I need to sit" with chair follow  Bed Mobility  Overal bed mobility Needs Assistance  Bed Mobility Rolling;Sidelying to Sit  Rolling Min assist  Sidelying to sit Mod assist  General bed mobility comments multimodal cues to  roll, sidelying to sitting for abdomen precautions. needs increased time  Balance  Overall balance assessment Needs assistance  Sitting-balance support Feet supported  Sitting balance-Leahy Scale Fair  Standing balance support Bilateral upper extremity supported  Standing balance-Leahy Scale Poor  Standing balance comment reliant on UE support of walker  Transfers  Overall transfer level Needs assistance  Equipment used Rolling walker (2 wheeled)  Transfers Sit to/from Stand  Sit to Stand Min assist;+2 physical assistance;+2 safety/equipment;From elevated surface   General transfer comment please see toilet transfer  OT - End  of Session  Equipment Utilized During Treatment Rolling walker  Activity Tolerance Patient tolerated treatment well  Patient left in chair;with call bell/phone within reach;with family/visitor present  Nurse Communication Mobility status  OT Assessment/Plan  OT Plan Discharge plan remains appropriate  OT Visit Diagnosis Other abnormalities of gait and mobility (R26.89);Pain  Pain - part of body  (abdomen)  OT Frequency (ACUTE ONLY) Min 2X/week  Follow Up Recommendations CIR  OT Equipment Tub/shower seat  AM-PAC OT "6 Clicks" Daily Activity Outcome Measure (Version 2)  Help from another person eating meals? 3  Help from another person taking care of personal grooming? 3  Help from another person toileting, which includes using toliet, bedpan, or urinal? 2  Help from another person bathing (including washing, rinsing, drying)? 2  Help from another person to put on and taking off regular upper body clothing? 3  Help from another person to put on and taking off regular lower body clothing? 1  6 Click Score 14  Progressive Mobility  What is the highest level of mobility based on the progressive mobility assessment? Level 4 (Walks with assist in room) - Balance while marching in place and cannot step forward and back - Complete  Mobility Ambulated with assistance in room  OT Goal Progression  Progress towards OT goals Progressing toward goals  Acute Rehab OT Goals  Patient Stated Goal return to baseline  OT Goal Formulation With patient/family  Time For Goal Achievement 08/27/20  Potential to Achieve Goals Good  ADL Goals  Pt Will Perform Upper Body Dressing sitting;with set-up  Pt Will Perform Lower Body Dressing with supervision;sit to/from stand;sitting/lateral leans;with adaptive equipment  Pt Will Transfer to Toilet with supervision;ambulating  Pt Will Perform Toileting - Clothing Manipulation and hygiene with  supervision;sit to/from stand;sitting/lateral leans  OT Time Calculation  OT Start Time (ACUTE ONLY) 1254  OT Stop Time (ACUTE ONLY) 1315  OT Time Calculation (min) 21 min  OT General Charges  $OT Visit 1 Visit   Delbert Phenix OT OT pager: [phone]

## 2020-08-19 NOTE — TOC Progression Note (Signed)
Transition of Care [facility]) - Progression Note    Patient Details  Name: Roger Brennan MRN: [id number] Date of Birth: 1960/12/01  Transition of Care [facility]) CM/SW Contact  Leeroy Cha, RN Phone Number: 08/19/2020, 8:15 AM  Clinical Narrative:    3 Days Post-Op    Subjective/Chief Complaint: No complaints. Tolerated clears     Objective: Vital signs in last 24 hours: Temp:  [97.5 F (36.4 C)-98.2 F (36.8 C)] 97.8 F (36.6 C) (07/19 0400) Pulse Rate:  [74-104] 75 (07/19 0600) Resp:  [8-22] 12 (07/19 0600) BP: (133-164)/(68-116) 147/78 (07/19 0600) SpO2:  [93 %-100 %] 98 % (07/19 0600) FiO2 (%):  [21 %] 21 % (07/19 0452) Last BM Date: 08/17/20 (colostomy)   Intake/Output from previous day: 07/18 0701 - 07/19 0700 In: 2253.6 [I.V.:1157; Blood:392.5; IV Piggyback:704.1] Out: 4008 [Urine:2150; Drains:120; QPYPP:5093] Intake/Output this shift: No intake/output data recorded.   General appearance: alert and cooperative Resp: clear to auscultation bilaterally Cardio: regular rate and rhythm GI: soft, nontender. Ostomy productive. Wounds clean  TOC PLAN OF CARE: following for toc needs and wound care.   Expected Discharge Plan: Home/Self Care Barriers to Discharge: No [facility] will accept this patient  Expected Discharge Plan and Services Expected Discharge Plan: Home/Self Care   Discharge Planning Services: CM Consult   Living arrangements for the past 2 months: Single Family Home                                       Social Determinants of Health (SDOH) Interventions    Readmission Risk Interventions No flowsheet data found.

## 2020-08-19 NOTE — TOC Progression Note (Signed)
Transition of Care [facility]) - Progression Note    Patient Details  Name: Roger Brennan MRN: [id number] Date of Birth: 16-Apr-1960  Transition [facility]) CM/SW Contact  Leeroy Cha, RN Phone Number: 08/19/2020, 11:20 AM  Clinical Narrative:    Tcf-wife concerned about his home care.and the open wounds.  Does not think that she can handle these at this point. Thankful  for the care he has received.  Just wanting to make sure he is being handled correctly.  Jim Desanctis is he still in icu. Astounded about insurance not approving the intitial ct scan for 24-48 hours after onset of symptoms. Lives in [place].   Expected Discharge Plan: Home/Self Care Barriers to Discharge: No [facility] will accept this patient  Expected Discharge Plan and Services Expected Discharge Plan: Home/Self Care   Discharge Planning Services: CM Consult   Living arrangements for the past 2 months: Single Family Home                                       Social Determinants of Health (SDOH) Interventions    Readmission Risk Interventions No flowsheet data found.

## 2020-08-19 NOTE — Progress Notes (Addendum)
PHARMACY - TOTAL PARENTERAL NUTRITION CONSULT NOTE   Indication: Intolerance to enteral feeding  Patient Measurements: Height: 5\' 10"  (177.8 cm) Weight: 86.1 kg (189 lb 13.1 oz) IBW/kg (Calculated) : 73 TPN AdjBW (KG): 73.9 Body mass index is 27.24 kg/m. Usual Weight: 84 kg  Assessment: 65 yoM admitted on 7/13 with abdominal wall abscess, necrotizing soft tissue of abdominal wall, perforated diverticulitis with colocutaneous fistula.  Glucose / Insulin: A1C 7.4, no medications PTA - Dexamethasone 5mg  (7/13), and Dex 10mg  (7/15) - CBGs: 104-150 - rSSI used: 12 units / 24h - Insulin in TPN: Regular insulin 20 units  Electrolytes: WNL Renal: SCr WNL/stable, BUN 21 Hepatic: LFTs wnl, Tbili 0.6 Triglycerides:  71 ( 7/16), 141 (7/18) Prealbumin: < 5 (7/16), 11.4 (7/18) Intake / Output; MIVF:  - UOP up to 2150 mL, NG 0 (clamped), drain down to 120 mL, colostomy 1175 mL; I/O Net -1191 mL. - IVF d/c on 7/17 GI Imaging:  7/13 CT: perforated diverticulitis with associated fistulous connection to the abdominal wall. Perforated neoplasm cannot be completely excluded. GI Surgeries / Procedures:  7/13: exploratory laparotomy, partial colectomy 7/15: exploratory laparotomy 7/16: repair abdominal wall/reexploration of abdominal wall to evaluate any further necrosis, possible debridement  Central access: PICC placed 7/16 TPN start date: 7/16   Nutritional Goals (per RD recommendation on 7/15): kCal: 2500-2700, Protein: 140-155 g, Fluid: >/= 2.5 L/day Goal TPN rate is 110 mL/hr (provides 153 g of protein and 2618 kcals per day)  Current Nutrition:  Clears > Full liquid diet TPN  Plan: Updated plan: Wean TPN to 1/2 rate over 2 hours and then d/c per Dr. Marlou Starks Continue SSI q4h, defer further adjustments to attending MD  Gretta Arab PharmD, BCPS Clinical Pharmacist [facility] main pharmacy [phone] 08/19/2020 8:13 AM

## 2020-08-19 NOTE — Progress Notes (Signed)
Received a critical glucose level of 652. Spoke to Dr. Ander Slade. Per MD orders to give 20 units of insulin. Pt stable at time. Will continue to monitor.

## 2020-08-19 NOTE — Progress Notes (Signed)
Nutrition Follow-up  DOCUMENTATION CODES:   Not applicable  INTERVENTION:  - will order Boost Breeze once/day each supplement provides 250 kcal and 9 grams of protein. - will order Ensure Enlive once/day each supplement provides 350 kcal and 20 grams of protein. - will Will order 30 ml Prosource Plus once/day, each supplement provides 100 kcal and 15 grams protein.  - TPN/TPN weaning per Pharmacist.    NUTRITION DIAGNOSIS:   Inadequate oral intake related to acute illness, decreased appetite as evidenced by meal completion < 50%.  GOAL:   Patient will meet greater than or equal to 90% of their needs  MONITOR:   PO intake, Supplement acceptance, Labs, Weight trends, Skin, Other (Comment) (TPN regimen)  REASON FOR ASSESSMENT:   Consult New TPN/TNA  ASSESSMENT:   60 year old male with no known medical history. He presented to the ED at the direction of his PCP d/t LLQ abdominal pain and abdominal wound x2-3 weeks. Wound started as a small area, about the size of a silver dollar, and has progressively expanded and worsened. He reported decreased appetite and intermittent fevers over the last several weeks. Last BM was the day PTA. He started having issues with constipation in early June and he made diet adjustments with fiber supplementation. His wife reported 20 lb weight loss in the past 6 weeks.  Significant Events: 7/13- admission with dx of abdominal wall abscess, necrotizing soft tissue of abdominal wall, perforated diverticulitis with colocutaneous fistula; I&D LLQ abdominal wall, partial colectomy and end colostomy, and splenic mobilization; NGT placement 7/15- initial RD assessment; exploration for concern of leak and inspection of L flank, LOAs, SBR with anastomosis, enterorrhaphy x2, debridement of necrotic fat and fascia of abdominal wall 7/16- placement of double lumen PICC in R basilic; debridement of abdominal wall with sharp dissection and pulsavac of wounds 7/18-  NGT removal; diet advanced to CLD 7/19- diet advanced to FLD    Diet advanced to Soft today at 1025. Patient sleeping with no family/visitors noted to be at bedside.   Able to talk with RN who reports patient had liquids for breakfast and that he consumed some of meal and that although it is unknown exactly how much, he only took a small amount. RN has let patient know about advancement to Soft diet.  No plans for return to the OR. He was last weighed on 7/16. Non-pitting edema to BUE and mild pitting edema to BLE documented in the edema section of flow sheet.   He is receiving TPN at 75 ml/hr and plan had been to increase to goal rate of 110 ml/hr, but Surgeon's note at 1025 says plan to begin weaning TPN; will monitor.   Labs reviewed; CBGs: 148, 150, 147, 133 mg/dl, BUN: 21 mg/dl, creatinine: 0.55 mg/dl, Ca: 7.9 mg/dl. Medications reviewed; sliding scale novolog, 40 mg IV protonix BID.   Diet Order:   Diet Order             DIET SOFT Room service appropriate? Yes; Fluid consistency: Thin  Diet effective now                   EDUCATION NEEDS:   Not appropriate for education at this time  Skin:  Skin Assessment: Skin Integrity Issues: Skin Integrity Issues:: Incisions Incisions: abdomen (7/13 and 7/15)  Last BM:  7/12, per patient report  Height:   Ht Readings from Last 1 Encounters:  08/12/20 5\' 10"  (1.778 m)    Weight:   Wt  Readings from Last 1 Encounters:  08/15/20 86.1 kg      Estimated Nutritional Needs:  Kcal:  2500-2700 kcal Protein:  140-155 grams Fluid:  >/= 2.5 L/day     Jarome Matin, MS, RD, LDN, CNSC Inpatient Clinical Dietitian RD pager # available in [place]  After hours/weekend pager # available in [facility]

## 2020-08-20 ENCOUNTER — Inpatient Hospital Stay (HOSPITAL_COMMUNITY): Payer: Managed Care, Other (non HMO)

## 2020-08-20 DIAGNOSIS — R609 Edema, unspecified: Secondary | ICD-10-CM

## 2020-08-20 LAB — GLUCOSE, CAPILLARY
Glucose-Capillary: 140 mg/dL — ABNORMAL HIGH (ref 70–99)
Glucose-Capillary: 98 mg/dL (ref 70–99)
Glucose-Capillary: 176 mg/dL — ABNORMAL HIGH (ref 70–99)
Glucose-Capillary: 157 mg/dL — ABNORMAL HIGH (ref 70–99)
Glucose-Capillary: 134 mg/dL — ABNORMAL HIGH (ref 70–99)
Glucose-Capillary: 138 mg/dL — ABNORMAL HIGH (ref 70–99)

## 2020-08-20 LAB — BASIC METABOLIC PANEL
Anion gap: 9 (ref 5–15)
BUN: 14 mg/dL (ref 6–20)
CO2: 23 mmol/L (ref 22–32)
Calcium: 8.2 mg/dL — ABNORMAL LOW (ref 8.9–10.3)
Chloride: 99 mmol/L (ref 98–111)
Creatinine, Ser: 0.7 mg/dL (ref 0.61–1.24)
GFR, Estimated: >60 mL/min (ref >60)
Glucose, Bld: 116 mg/dL — ABNORMAL HIGH (ref 70–99)
Potassium: 4 mmol/L (ref 3.5–5.1)
Sodium: 131 mmol/L — ABNORMAL LOW (ref 135–145)

## 2020-08-20 LAB — HEPARIN LEVEL (UNFRACTIONATED): Heparin Unfractionated: 0.15 IU/mL — ABNORMAL LOW (ref 0.30–0.70)

## 2020-08-20 LAB — COMPREHENSIVE METABOLIC PANEL
ALT: UNDETERMINED U/L (ref 0–44)
AST: UNDETERMINED U/L (ref 15–41)
Albumin: UNDETERMINED g/dL (ref 3.5–5.0)
Alkaline Phosphatase: UNDETERMINED U/L (ref 38–126)
Anion gap: UNDETERMINED (ref 5–15)
BUN: UNDETERMINED mg/dL (ref 6–20)
CO2: UNDETERMINED mmol/L (ref 22–32)
Calcium: UNDETERMINED mg/dL (ref 8.9–10.3)
Chloride: UNDETERMINED mmol/L (ref 98–111)
Creatinine, Ser: UNDETERMINED mg/dL (ref 0.61–1.24)
GFR calc Af Amer: UNDETERMINED mL/min (ref >60)
GFR, Estimated: UNDETERMINED mL/min (ref >60)
Glucose, Bld: UNDETERMINED mg/dL (ref 70–99)
Potassium: UNDETERMINED mmol/L (ref 3.5–5.1)
Sodium: UNDETERMINED mmol/L (ref 135–145)
Total Bilirubin: UNDETERMINED mg/dL (ref 0.3–1.2)
Total Protein: UNDETERMINED g/dL (ref 6.5–8.1)

## 2020-08-20 LAB — CBC
HCT: 24 % — ABNORMAL LOW (ref 39.0–52.0)
Hemoglobin: 7.9 g/dL — ABNORMAL LOW (ref 13.0–17.0)
MCH: 31.1 pg (ref 26.0–34.0)
MCHC: 32.9 g/dL (ref 30.0–36.0)
MCV: 94.5 fL (ref 80.0–100.0)
Platelets: 365 10*3/uL (ref 150–400)
RBC: 2.54 MIL/uL — ABNORMAL LOW (ref 4.22–5.81)
RDW: 16.1 % — ABNORMAL HIGH (ref 11.5–15.5)
WBC: 20.7 10*3/uL — ABNORMAL HIGH (ref 4.0–10.5)
nRBC: 0 % (ref 0.0–0.2)

## 2020-08-20 LAB — PHOSPHORUS: Phosphorus: 3.4 mg/dL (ref 2.5–4.6)

## 2020-08-20 LAB — MAGNESIUM: Magnesium: 1.7 mg/dL (ref 1.7–2.4)

## 2020-08-20 MED ORDER — METHOCARBAMOL 500 MG PO TABS
1000.0000 mg | ORAL_TABLET | Freq: Three times a day (TID) | ORAL | Status: DC
  Administered 2020-08-20 – 2020-09-01 (×36): 1000 mg via ORAL
  Filled 2020-08-20 (×37): qty 2

## 2020-08-20 MED ORDER — HEPARIN (PORCINE) 25000 UT/250ML-% IV SOLN
2050.0000 [IU]/h | INTRAVENOUS | Status: DC
  Administered 2020-08-20: 1650 [IU]/h via INTRAVENOUS
  Administered 2020-08-21 (×2): 1900 [IU]/h via INTRAVENOUS
  Administered 2020-08-22: 2050 [IU]/h via INTRAVENOUS
  Filled 2020-08-20 (×4): qty 250

## 2020-08-20 MED ORDER — OXYCODONE HCL 5 MG PO TABS
5.0000 mg | ORAL_TABLET | ORAL | Status: DC | PRN
  Administered 2020-08-20 – 2020-08-22 (×2): 5 mg via ORAL
  Administered 2020-08-22 – 2020-08-27 (×11): 10 mg via ORAL
  Administered 2020-08-28: 5 mg via ORAL
  Administered 2020-08-28 – 2020-09-01 (×12): 10 mg via ORAL
  Filled 2020-08-20 (×8): qty 2
  Filled 2020-08-20: qty 1
  Filled 2020-08-20 (×17): qty 2
  Filled 2020-08-20: qty 1

## 2020-08-20 MED ORDER — HEPARIN (PORCINE) 25000 UT/250ML-% IV SOLN
1400.0000 [IU]/h | INTRAVENOUS | Status: DC
  Administered 2020-08-20: 1400 [IU]/h via INTRAVENOUS
  Filled 2020-08-20: qty 250

## 2020-08-20 MED ORDER — HYDROMORPHONE HCL 1 MG/ML IJ SOLN
0.5000 mg | INTRAMUSCULAR | Status: DC | PRN
  Administered 2020-08-21 – 2020-09-01 (×26): 1 mg via INTRAVENOUS
  Filled 2020-08-20 (×27): qty 1

## 2020-08-20 MED ORDER — INSULIN ASPART 100 UNIT/ML IJ SOLN
0.0000 [IU] | Freq: Three times a day (TID) | INTRAMUSCULAR | Status: DC
  Administered 2020-08-20 (×2): 3 [IU] via SUBCUTANEOUS
  Administered 2020-08-21: 2 [IU] via SUBCUTANEOUS
  Administered 2020-08-21 (×2): 3 [IU] via SUBCUTANEOUS
  Administered 2020-08-22: 5 [IU] via SUBCUTANEOUS
  Administered 2020-08-22: 3 [IU] via SUBCUTANEOUS
  Administered 2020-08-22: 2 [IU] via SUBCUTANEOUS
  Administered 2020-08-23: 3 [IU] via SUBCUTANEOUS
  Administered 2020-08-23: 5 [IU] via SUBCUTANEOUS
  Administered 2020-08-23 – 2020-08-24 (×2): 3 [IU] via SUBCUTANEOUS
  Administered 2020-08-24: 11 [IU] via SUBCUTANEOUS
  Administered 2020-08-24: 8 [IU] via SUBCUTANEOUS
  Administered 2020-08-25: 5 [IU] via SUBCUTANEOUS
  Administered 2020-08-25 – 2020-08-26 (×3): 3 [IU] via SUBCUTANEOUS
  Administered 2020-08-26 – 2020-08-27 (×3): 5 [IU] via SUBCUTANEOUS
  Administered 2020-08-27 (×2): 3 [IU] via SUBCUTANEOUS
  Administered 2020-08-28 (×2): 5 [IU] via SUBCUTANEOUS
  Administered 2020-08-28: 2 [IU] via SUBCUTANEOUS
  Administered 2020-08-29: 5 [IU] via SUBCUTANEOUS
  Administered 2020-08-29: 2 [IU] via SUBCUTANEOUS
  Administered 2020-08-29 – 2020-08-30 (×2): 3 [IU] via SUBCUTANEOUS
  Administered 2020-08-30: 5 [IU] via SUBCUTANEOUS
  Administered 2020-08-31: 8 [IU] via SUBCUTANEOUS
  Administered 2020-08-31: 2 [IU] via SUBCUTANEOUS
  Administered 2020-09-01: 5 [IU] via SUBCUTANEOUS
  Administered 2020-09-01: 3 [IU] via SUBCUTANEOUS

## 2020-08-20 MED ORDER — ACETAMINOPHEN 500 MG PO TABS
1000.0000 mg | ORAL_TABLET | Freq: Four times a day (QID) | ORAL | Status: DC | PRN
  Administered 2020-08-20 – 2020-08-29 (×7): 1000 mg via ORAL
  Filled 2020-08-20 (×7): qty 2

## 2020-08-20 NOTE — Progress Notes (Signed)
ANTICOAGULATION CONSULT - Brief note  Pharmacy Consult for Heparin Indication: DVT  Patient Measurements: Height: 5\' 10"  (177.8 cm) Weight: 86.1 kg (189 lb 13.1 oz) IBW/kg (Calculated) : 73 Heparin Dosing Weight: 86 kg  Infusions:   sodium chloride Stopped (08/14/20 0427)   sodium chloride Stopped (08/17/20 1800)   heparin 1,400 Units/hr (08/20/20 1809)   piperacillin-tazobactam (ZOSYN)  IV 3.375 g (08/20/20 1813)    Assessment:  See earlier note from Graylin Shiver PharmD for full assesment. Pharmacy is dosing heparin IV for new DVT  Heparin level 0.15, subtherapeutic (drawn late, took multiple phlebotomy attempts to get blood) Heparin infusing through PICC line (only IV access), lab drawn peripherally by phlebotomy. No bleeding or any IV complications per RN Hgb today remained low/stable at 7.9, Plt WNL  Goal of Therapy:  Heparin level 0.3-0.7 units/ml Monitor platelets by anticoagulation protocol: Yes   Plan:  No bolus per previous note Increase to heparin IV infusion at 1650 units/hr Heparin level in 6 hours after rate change Daily heparin level and CBC   Gretta Arab PharmD, BCPS Clinical Pharmacist [facility] main pharmacy [phone] 08/20/2020 6:39 PM

## 2020-08-20 NOTE — Progress Notes (Addendum)
Notified of DVT of the axillary vein around patients PICC, SVT of the basilic vein surrounding patients PICC and R gastroc DVT. Start IV heparin (hgb stable). I discussed with Vascular team over at [facility] who stated the PICC does not need to come out. Discussed with attending and patients RN.   Jillyn Ledger , [facility] 08/20/2020, 10:03 AM Please see Amion for pager number during day hours 7:00am-4:30pm

## 2020-08-20 NOTE — Progress Notes (Signed)
IP rehab admissions - I called and spoke with [name], patients wife.  Wife is very interested in inpatient rehab admission.  Wife can provide supervision after a rehab stay.  Patient will need authorization from [employer] carrier prior to CIR.  I will wait until patient is closer to medical readiness prior to requesting inpatient rehab admission.  Call for questions.  [phone]

## 2020-08-20 NOTE — Progress Notes (Signed)
Per surgical PA, PICC line is to remain in place for heparin gtt and other IV access needs. Surgical PA verified with vascular and IV team that best practice is to leave PICC line in place and receive anticoagulation through line. This RN will continue to carefully monitor pt.

## 2020-08-20 NOTE — Progress Notes (Signed)
Bilateral upper and lower extremity venous duplexes have been completed. Preliminary results can be found in CV Proc through chart review.  Results were given to [employer] PA.  08/20/20 9:50 AM Carlos Levering RVT

## 2020-08-20 NOTE — Progress Notes (Signed)
PT Cancellation Note  Patient Details Name: Roger Brennan MRN: [id number] DOB: 03-26-1960   Cancelled Treatment:     hold off PT for today per RN.  New findings of DVT and will be starting Heparin.  Check back tomorrow   Nathanial Rancher 08/20/2020, 2:35 PM

## 2020-08-20 NOTE — Consult Note (Signed)
WOC Nurse Consult Note: Due to activities of the morning, will plan to change ostomy pouch tomorrow.  [place] nursing team will follow, and will remain available to this patient, the nursing and medical teams.  Thanks, Maudie Flakes, MSN, RN, [place], Arther Abbott  Pager# [phone]

## 2020-08-20 NOTE — Progress Notes (Signed)
Pharmacy Antibiotic Note  Roger Brennan is a 60 y.o. male admitted on 08/12/2020 with intra-abdominal infection.  Pharmacy has been consulted for Zosyn dosing.  Eraxis added per MD.  Plan:  Continue Zosyn 3.375g IV Q8H infused over 4hrs for total 14 days Follow up renal function, culture results, and clinical course  Day 9 Zosyn, Day 7 Anidulafungin (Eraxis) - completed    Height: 5\' 10"  (177.8 cm) Weight: 86.1 kg (189 lb 13.1 oz) IBW/kg (Calculated) : 73  Temp (24hrs), Avg:97.9 F (36.6 C), Min:97.3 F (36.3 C), Max:98.8 F (37.1 C)  Recent Labs  Lab 08/16/20 0452 08/16/20 1748 08/17/20 0303 08/18/20 0417 08/18/20 0833 08/19/20 0500 08/19/20 1236 08/20/20 0755  WBC 22.0* 27.3* 21.8*  --  28.4*  --   --  20.7*  CREATININE 0.77  --  0.66 0.56*  --  0.57*  0.55* 0.74 0.70     Estimated Creatinine Clearance: 101.4 mL/min (by C-G formula based on SCr of 0.7 mg/dL).    No Known Allergies  Antimicrobials this admission: 7/13 Vanc 1500 >> 7/15 7/13 cefepime 2 gm x 1 7/13 flagyl x 1 7/13 Clindamycin >>7/18 7/13 Zosyn >>  7/15 Eraxis >> 7/21   Dose adjustments this admission: 7/14 increase vanc from 1250mg  to 1500mg  q24h (SCr 1.1, est AUC 429)   Microbiology results: 7/13 UCx: 5k E.coli 7/13 BCx2: ngF 7/15 tissue/abd: few B fragilis, B lactamase positive - Final  Thank you for allowing pharmacy to be a part of this patient's care.  Graylin Shiver PharmD Clinical Pharmacist [facility] main pharmacy [phone] 08/20/2020 9:57 AM

## 2020-08-20 NOTE — Progress Notes (Addendum)
VAST RN consulted to obtain multiple IV access sites and discontinue PICC d/t DVT. Contacted ordering PA, [place], Legrand Como and educated Best practice is to leave PICC in place and treat clot through PICC. He verbalized understanding and requested I discontinue the order to discontinue the PICC. ICU RN notified.

## 2020-08-20 NOTE — Progress Notes (Signed)
5 Days Post-Op  Subjective: CC: Afebrile overnight. Reports pain in abdomen when mobilizing and with dressing changes. Controlled on PCA. Tolerating diet without n/v. Voiding. Working with therapies.   Objective: Vital signs in last 24 hours: Temp:  [97.3 F (36.3 C)-98.8 F (37.1 C)] 97.9 F (36.6 C) (07/21 0338) Pulse Rate:  [79-88] 80 (07/21 0700) Resp:  [11-19] 14 (07/21 0700) BP: (131-156)/(69-107) 136/78 (07/21 0700) SpO2:  [91 %-100 %] 100 % (07/21 0700) FiO2 (%):  [0 %-100 %] 0 % (07/21 0400) Last BM Date: 08/19/20  Intake/Output from previous day: 07/20 0701 - 07/21 0700 In: 1495.8 [P.O.:480; I.V.:479.5; IV Piggyback:536.3] Out: 2075 [Urine:1950; Drains:75; Stool:50] Intake/Output this shift: No intake/output data recorded.  PE: Gen:  Alert, NAD, pleasant HEENT: EOM's intact, pupils equal and round Card:  RRR Pulm:  CTAB, no W/R/R, effort normal. On o2 Abd: Soft, ND, appropriately tender around wounds +BS, colostomy bag with black stool. Unable to visualize colostomy 2/2 stool. Drain 75cc/24 hours - SS. Wounds as noted below. LLQ wound with healthy granulation tissue at the base. Inferior/lateral to the wound there was some induration on the skin as noted in the picture 1/2 below. A false bottom/wall had formed at the inferior portion of the wound and I was able to open this extending the wound ~5cm inferior and drain SS fluid (see picture 3). Midline wound as noted in picture #3. Mixed granulation and fatty tissue without dehiscence or evisceration. The inferior aspect of the midline wound tunnels and connects to the LLQ wound. RLQ wound with healthy grannulation tissue at the base. GU: No foley Ext:  LUE pitting edema. Radial pulses 2+. Trace RUE and BLE. No calf tenderness. DP 2+ Psych: A&Ox3  Skin: no rashes noted, warm and dry          Lab Results:  Recent Labs    08/17/20 1554 08/18/20 0833  WBC  --  28.4*  HGB 8.3* 8.1*  HCT 25.0* 24.8*  PLT   --  289   BMET Recent Labs    08/19/20 0500 08/19/20 1236  NA 135 122*  K 3.8 6.0*  CL 105 95*  CO2 23 20*  GLUCOSE 184* 652*  BUN 21* 17  CREATININE 0.57*  0.55* 0.74  CALCIUM 7.9* 7.5*   PT/INR No results for input(s): LABPROT, INR in the last 72 hours. CMP     Component Value Date/Time   NA 122 (L) 08/19/2020 1236   K 6.0 (H) 08/19/2020 1236   CL 95 (L) 08/19/2020 1236   CO2 20 (L) 08/19/2020 1236   GLUCOSE 652 (HH) 08/19/2020 1236   BUN 17 08/19/2020 1236   CREATININE 0.74 08/19/2020 1236   CALCIUM 7.5 (L) 08/19/2020 1236   PROT 4.2 (L) 08/19/2020 1236   ALBUMIN 1.7 (L) 08/19/2020 1236   AST 19 08/19/2020 1236   ALT 14 08/19/2020 1236   ALKPHOS 99 08/19/2020 1236   BILITOT 0.5 08/19/2020 1236   GFRNONAA >60 08/19/2020 1236   Lipase     Component Value Date/Time   LIPASE 18 08/12/2020 1010    Studies/Results: No results found.  Anti-infectives: Anti-infectives (From admission, onward)    Start     Dose/Rate Route Frequency Ordered Stop   08/15/20 1000  anidulafungin (ERAXIS) 100 mg in sodium chloride 0.9 % 100 mL IVPB        100 mg 78 mL/hr over 100 Minutes Intravenous Every 24 hours 08/14/20 1349     08/14/20 1600  anidulafungin (  ERAXIS) 200 mg in sodium chloride 0.9 % 200 mL IVPB        200 mg 78 mL/hr over 200 Minutes Intravenous  Once 08/14/20 1349 08/14/20 2000   08/14/20 1000  vancomycin (VANCOREADY) IVPB 1500 mg/300 mL  Status:  Discontinued        1,500 mg 150 mL/hr over 120 Minutes Intravenous Every 24 hours 08/13/20 1009 08/14/20 0901   08/13/20 1000  vancomycin (VANCOREADY) IVPB 1250 mg/250 mL  Status:  Discontinued        1,250 mg 166.7 mL/hr over 90 Minutes Intravenous Every 24 hours 08/12/20 1910 08/13/20 1009   08/12/20 2200  piperacillin-tazobactam (ZOSYN) IVPB 3.375 g  Status:  Discontinued        3.375 g 12.5 mL/hr over 240 Minutes Intravenous Every 8 hours 08/12/20 1904 08/12/20 1941   08/12/20 2000  clindamycin (CLEOCIN) IVPB  600 mg  Status:  Discontinued        600 mg 100 mL/hr over 30 Minutes Intravenous Every 8 hours 08/12/20 1757 08/17/20 0844   08/12/20 1830  piperacillin-tazobactam (ZOSYN) IVPB 3.375 g        3.375 g 12.5 mL/hr over 240 Minutes Intravenous Every 8 hours 08/12/20 1811     08/12/20 1015  vancomycin (VANCOREADY) IVPB 1500 mg/300 mL        1,500 mg 150 mL/hr over 120 Minutes Intravenous  Once 08/12/20 1007 08/12/20 1330   08/12/20 1000  ceFEPIme (MAXIPIME) 2 g in sodium chloride 0.9 % 100 mL IVPB        2 g 200 mL/hr over 30 Minutes Intravenous  Once 08/12/20 1000 08/12/20 1121   08/12/20 1000  metroNIDAZOLE (FLAGYL) IVPB 500 mg        500 mg 100 mL/hr over 60 Minutes Intravenous  Once 08/12/20 1000 08/12/20 1226   08/12/20 1000  vancomycin (VANCOCIN) IVPB 1000 mg/200 mL premix  Status:  Discontinued        1,000 mg 200 mL/hr over 60 Minutes Intravenous  Once 08/12/20 1000 08/12/20 1007        Assessment/Plan POD 8 s/p drainage and debridement of LLQ abdominal wall 15 x 20 cm, partial colectomy with end colostomy, Splenic mobilization by Dr. Kieth Brightly on 08/12/20 for abdominal wall abscess, diverticulitis POD 6 s/p reopening of recent laparotomy, lysis of adhesions for 45 min. Small bowel resection with anastomosis, enterrorhaphy x 2, debridement of 200 cm^2 of necrotic fat and fascia of abdominal wall by Dr. Kieth Brightly on 08/14/20 for intestinal injury POD 5 s/p Debridement of abdominal wall with above dimensions with sharp dissection and Pulsavac of wounds by Dr. Brantley Stage on 7/16 for Open abdominal wall wounds left flank, right lower quadrant total area 738 cm ^2  - The area left lower quadrants was 20 cm x 30 cm and midline 20 cm x 6 cm and right lower quadrant 6 x 3 cm - Off TPN. On soft diet. Nutrition following.  - Colostomy functioning - Cont wound care. BID WTD. Monitor LLQ wound where I bluntly opened false bottom 7/21. - WOCN following - Considering weaning PCA - Cont abx. Comp 7d  Eraxis, cont Zosyn for 14d total - Cont drain - Consider transfer out of the unit today if labs look okay - Mobilize. PT/OT rec CIR - Pulm toilet - Path 7/13 w/ diverticulitis - no evidence of malignancy   FEN - Soft, SLIV (net +10L since admission) VTE - SCDs, heparin subq - check UE and LE duplex given asymmetric edema ID -  Currently on Zosyn. Bcx negative.  Foley - Out, voiding Dispo - CIR following. Recheck labs. Dopplers. Consider moving out of the unit and weaning PCA later today.   Hyperkalemia - Last K 6.0. Question if inaccurate given large change from prior labs. Recheck now. If > 5.5 will order EKG, Calcium Gluconate, Lasix etc Hyponatremia - Last NA 122. Question if inaccurate given large change from prior labs. Recheck now Hyperglycemia - Glucose 652 on BMP yesterday. Question if inaccurate given large change from prior labs. Blood sugars have been now between 62-118. Last CBG 98. Q4hr glucose checks. A1c 7.4. He is on SSI. Spoke with pharmacy, will downgrade from resistant SSI to moderate ABL anemia - last hgb 8.1. recheck now. Dark/melanous like stool today.  Protein Calorie Malnutrition - Pre-alb < 5 -> 11.4. Weaning TPN, on diet. Nutrition following for supplements.    LOS: 8 days    Jillyn Ledger , [facility] Surgery 08/20/2020, 7:38 AM Please see Amion for pager number during day hours 7:00am-4:30pm

## 2020-08-20 NOTE — Progress Notes (Signed)
ANTICOAGULATION CONSULT NOTE   Pharmacy Consult for Heparin Indication: DVT  No Known Allergies  Patient Measurements: Height: 5\' 10"  (177.8 cm) Weight: 86.1 kg (189 lb 13.1 oz) IBW/kg (Calculated) : 73 Heparin Dosing Weight: 86 kg  Vital Signs: Temp: 97.8 F (36.6 C) (07/21 0800) Temp Source: Oral (07/21 0800) BP: 147/67 (07/21 0900) Pulse Rate: 83 (07/21 0900)  Labs: Recent Labs    08/17/20 1554 08/18/20 0417 08/18/20 0833 08/19/20 0500 08/19/20 1236 08/20/20 0755  HGB 8.3*  --  8.1*  --   --  7.9*  HCT 25.0*  --  24.8*  --   --  24.0*  PLT  --   --  289  --   --  365  CREATININE  --    < >  --  0.57*  0.55* 0.74 0.70   < > = values in this interval not displayed.    Estimated Creatinine Clearance: 101.4 mL/min (by C-G formula based on SCr of 0.7 mg/dL).   Medical History: History reviewed. No pertinent past medical history.  Medications:  Scheduled:   (feeding supplement) PROSource Plus  30 mL Oral Daily   Chlorhexidine Gluconate Cloth  6 each Topical Daily   feeding supplement  1 Container Oral Q24H   feeding supplement  237 mL Oral Q24H   insulin aspart  0-15 Units Subcutaneous TID WC   mouth rinse  15 mL Mouth Rinse BID   methocarbamol  1,000 mg Oral TID   pantoprazole (PROTONIX) IV  40 mg Intravenous Q12H   sodium chloride flush  10-40 mL Intracatheter Q12H   Infusions:   sodium chloride Stopped (08/14/20 0427)   sodium chloride Stopped (08/17/20 1800)   anidulafungin Stopped (08/19/20 1123)   heparin     piperacillin-tazobactam (ZOSYN)  IV Stopped (08/20/20 0531)    Assessment: EC fistula, 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 PICC line RUE, with new DVT  Goal of Therapy:  Heparin level 0.3-0.7 units/ml Monitor platelets by anticoagulation protocol: Yes   Plan:  Discontinue SQ Heparin, last dose this am 0521 Heparin infusion at 1400 units/hr, no loading dose Check HL six hr after Heparin started  Minda Ditto PharmD 08/20/2020,10:28 AM

## 2020-08-20 NOTE — PMR Pre-admission (Signed)
PMR Admission Coordinator Pre-Admission Assessment  Patient: Roger Brennan is an 60 y.o., male MRN: [id number] DOB: 26-Jun-1960 Height: _0  (177.8 cm) Weight: 80.7 kg  Insurance Information HMO: yes    PPO:      PCP:      IPA:      80/20:      OTHER: Group [id number] PRIMARY: C[employer] managed      Policy#: [id number]      Subscriber: patient CM Name: Sharen Counter       Phone#: [phone]     Fax#: [phone] I received a call from [facility] on 8/2, granting auth from 09/01/20-09/07/20 with updates due 8/8.  Pre-Cert#: [id number]      Employer: Full time Benefits:  Phone: Online - [email or web address] Eff Date: 02/01/2019 - 01/30/2021 Deductible: $4,000 ($4,000 met) OOP Max: $7,550 ($4,991.68 met) CIR: 70% coverage, 30% co-insurance SNF: 70% coverage, 30% co-insurance; limited to 60 days per service year Outpatient: $70 copay; limited to 20 visits/cal yr (20 remaining) Home Health:  70% coverage, 30% co-insurance; limited to 60 visits/cal yr (79 remaining) DME: 70% coverage, 30% co-insurance Providers: in network  SECONDARY: none       Policy#:       Phone#:    Development worker, community:        Phone#:    The Engineer, petroleum" for patients in Inpatient Rehabilitation Facilities with attached "Privacy Act [place] Records" was provided and verbally reviewed with: N/A  Emergency Contact Information Contact Information     Name Relation Home Work Mobile   allred,debra Spouse   [phone]       Current Medical History  Patient Admitting Diagnosis: Debility  History of Present Illness: An otherwise healthy 60 year old male who presented to [facility] today at the direction of PCP with LLQ abdominal pain and induration for the last 2-3 weeks. This started as a small area, about the size of a silver dollar, and has progressively expanded and worsened. He reports pain after physical activity. Reports abdominal wall was not erythematous until today. He does  report decreased appetite and intermittent fevers over the last several weeks. Denies chest pain, SOB, urinary symptoms, nausea, vomiting. He is having bowel movements still and last BM was yesterday. He started having some issues with constipation in early June and he made diet adjustments with fiber supplementation for this. His PCP was seeing him for this and was planning a CT scan but the earliest he could get scheduled was 7/26 and PCP felt he needed to be evaluated sooner. His wife is at the bedside and also reports ~20 lbs weight loss over the last 6 weeks. Past abdominal surgery includes bilateral inguinal hernia repairs at age 44. He reports NKDA and takes no blood thinning medications. He denies tobacco use, heavy alcohol use or illicit drug use. 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 He has never had a colonoscopy. During ED evaluation, a CT abdomen was obtained which revealed a colon perforation, abscess formation and fistulization to abdominal wall. HDS in the ED, got 2L LR and broad abx (vanc cefepime flagyl). The patient was taken emergently to the OR for ex lap, debridement of necrotizing soft tissue infection, partial colectomy with end colostomy. Course complicated by DVTs and acute blood loss anemia. Hgb  now stable at 8.2  PT/OT evaluations were done with recommendations for inpatient rehab admission due to a functional decline.  Patient's medical record from [facility] has been reviewed by the rehabilitation admission coordinator and physician.  Past Medical History  History reviewed. No pertinent past medical history.  Family History   family history is not on file.  Prior Rehab/Hospitalizations Has the patient had prior rehab or hospitalizations prior to admission? No  Has the patient had major surgery during 100 days prior to admission? Yes   Current Medications  Current Facility-Administered  Medications:    acetaminophen (TYLENOL) tablet 1,000 mg, 1,000 mg, Oral, Q6H PRN, Jillyn Ledger, PA-C, 1,000 mg at 08/29/20 1508   apixaban (ELIQUIS) tablet 5 mg, 5 mg, Oral, BID, Shade, Haze Justin, RPH, 5 mg at 08/31/20 2234   celecoxib (CELEBREX) capsule 200 mg, 200 mg, Oral, Daily, Paralee Cancel, MD, 200 mg at 08/31/20 9826   Chlorhexidine Gluconate Cloth 2 % PADS 6 each, 6 each, Topical, Daily, Jillyn Ledger, PA-C, 6 each at 08/31/20 1001   diclofenac Sodium (VOLTAREN) 1 % topical gel 2 g, 2 g, Topical, QID PRN, Coralie Keens, MD, 2 g at 08/29/20 1502   diphenhydrAMINE (BENADRYL) injection 12.5 mg, 12.5 mg, Intravenous, Q6H PRN **OR** diphenhydrAMINE (BENADRYL) 12.5 MG/5ML elixir 12.5 mg, 12.5 mg, Oral, Q6H PRN, Maczis, Barth Kirks, PA-C   feeding supplement (BOOST / RESOURCE BREEZE) liquid 1 Container, 1 Container, Oral, BID BM, Maczis, Barth Kirks, PA-C, 1 Container at 08/31/20 1719   feeding supplement (ENSURE ENLIVE / ENSURE PLUS) liquid 237 mL, 237 mL, Oral, BID BM, Jillyn Ledger, PA-C, 237 mL at 08/31/20 2234   HYDROmorphone (DILAUDID) injection 0.5-1 mg, 0.5-1 mg, Intravenous, Q3H PRN, Jillyn Ledger, PA-C, 1 mg at 09/01/20 0436   insulin aspart (novoLOG) injection 0-15 Units, 0-15 Units, Subcutaneous, TID WC, Maczis, Barth Kirks, PA-C, 8 Units at 08/31/20 1335   MEDLINE mouth rinse, 15 mL, Mouth Rinse, BID, Maczis, Barth Kirks, PA-C, 15 mL at 08/31/20 2234   methocarbamol (ROBAXIN) tablet 1,000 mg, 1,000 mg, Oral, TID, Maczis, Barth Kirks, PA-C, 1,000 mg at 08/31/20 2234   metoprolol tartrate (LOPRESSOR) injection 5 mg, 5 mg, Intravenous, Q6H PRN, Maczis, Barth Kirks, PA-C   multivitamin with minerals tablet 1 tablet, 1 tablet, Oral, Daily, Maczis, Barth Kirks, PA-C, 1 tablet at 08/31/20 1000   naloxone (NARCAN) injection 0.4 mg, 0.4 mg, Intravenous, PRN **AND** sodium chloride flush (NS) 0.9 % injection 9 mL, 9 mL, Intravenous, PRN, Maczis, Barth Kirks, PA-C   nutrition supplement  (JUVEN) (JUVEN) powder packet 1 packet, 1 packet, Oral, BID BM, Maczis, Barth Kirks, PA-C, 1 packet at 08/31/20 1723   ondansetron (ZOFRAN-ODT) disintegrating tablet 4 mg, 4 mg, Oral, Q6H PRN **OR** ondansetron (ZOFRAN) injection 4 mg, 4 mg, Intravenous, Q6H PRN, Maczis, Michael M, PA-C   oxyCODONE (Oxy IR/ROXICODONE) immediate release tablet 5-10 mg, 5-10 mg, Oral, Q4H PRN, Jillyn Ledger, PA-C, 10 mg at 08/31/20 2234   pantoprazole (PROTONIX) EC tablet 40 mg, 40 mg, Oral, BID, Eudelia Bunch, RPH, 40 mg at 08/31/20 2234   polyethylene glycol (MIRALAX / GLYCOLAX) packet 17 g, 17 g, Oral, Daily PRN, Maczis, Barth Kirks, PA-C   sodium chloride flush (NS) 0.9 % injection 10-40 mL, 10-40 mL, Intracatheter, Q12H, Maczis, Barth Kirks, PA-C, 10 mL at 08/31/20 2230   sodium chloride flush (NS) 0.9 % injection 10-40 mL, 10-40 mL, Intracatheter, PRN, Jillyn Ledger, PA-C, 10 mL at 08/17/20 1709  Patients Current Diet:  Diet Order  DIET SOFT Room service appropriate? Yes; Fluid consistency: Thin; Fluid restriction: 1800 mL Fluid  Diet effective now                   Precautions / Restrictions Precautions Precautions: Fall Precaution Comments: colostomy, R JP drain, bandages over full abdomen, Restrictions Weight Bearing Restrictions: No   Has the patient had 2 or more falls or a fall with injury in the past year? No  Prior Activity Level Community (5-7x/wk): Went out daily, was working as Geologist, engineering.  Prior Functional Level Self Care: Did the patient need help bathing, dressing, using the toilet or eating? Independent  Indoor Mobility: Did the patient need assistance with walking from room to room (with or without device)? Independent  Stairs: Did the patient need assistance with internal or external stairs (with or without device)? Independent  Functional Cognition: Did the patient need help planning regular tasks such as shopping or remembering to take  medications? Independent  Home Assistive Devices / Equipment Home Assistive Devices/Equipment: None Home Equipment: None  Prior Device Use: Indicate devices/aids used by the patient prior to current illness, exacerbation or injury? None of the above  Current Functional Level Cognition  Overall Cognitive Status: Within Functional Limits for tasks assessed Orientation Level: Oriented X4 General Comments: Anxious regarding pain.    Extremity Assessment (includes Sensation/Coordination)  Upper Extremity Assessment: Overall WFL for tasks assessed  Lower Extremity Assessment: Defer to PT evaluation    ADLs  Overall ADL's : Needs assistance/impaired Eating/Feeding: NPO Grooming: Set up, Oral care, Wash/dry face, Brushing hair, Sitting Grooming Details (indicate cue type and reason): up in recliner to perform grooming tasks. Upper Body Bathing: Minimal assistance Upper Body Bathing Details (indicate cue type and reason): Needed assistance to wash R arm pit reporting difficulty with R elbow due to pain. Lower Body Bathing: Moderate assistance, Sitting/lateral leans, Bed level Upper Body Dressing : Minimal assistance, Sitting Lower Body Dressing: Total assistance, Sitting/lateral leans Lower Body Dressing Details (indicate cue type and reason): Total As to don socks. Toilet Transfer: RW, BSC, Moderate assistance, +2 for physical assistance Toilet Transfer Details (indicate cue type and reason): Edge of bed elevated, able to power up to standing on first attempt this session with moderate assist of 2. Needs cues for hand and foot placement and for anterior weight shift. Once standing increased time needed to allow pt to accomplish upright posture and maximum assist to negotiate RW in correct proximity for safety and leverage.  Pt then able to pivot to recliner (simulated in prep for [facility] transfers) with increased time/effort, multimodal cues and Moderate assist of 2 people.  Pt lowered onto  recliner with moderate assist of 2 with 2nd person needed to assist pt's RT foot forward prior to sitting to control RT knee pain. Toileting- Clothing Manipulation and Hygiene: Total assistance, Bed level Functional mobility during ADLs: Moderate assistance, +2 for physical assistance, Rolling walker, Cueing for sequencing, Cueing for safety General ADL Comments: Pt rated effort of bed mobility and up to chair was 10/10 on RPE.    Mobility  Overal bed mobility: Needs Assistance Bed Mobility: Supine to Sit Rolling: Min assist Sidelying to sit: Min assist Supine to sit: Mod assist Sit to supine: Mod assist, +2 for physical assistance, +2 for safety/equipment, HOB elevated (+3 pt's wife assisting with sit to supine transfer) Sit to sidelying: +2 for safety/equipment, +2 for physical assistance, Max assist General bed mobility comments: min A to support RLE (painful when flexed more  than ~30*), VCs technique for roll and sidelying to sit, used bedrail, used gait belt looped on R foot as leg lifter    Transfers  Overall transfer level: Needs assistance Equipment used: Rolling walker (2 wheeled) Transfer via Lift Equipment: Stedy Transfers: Sit to/from Stand, Risk manager Sit to Stand: +2 physical assistance, Mod assist, From elevated surface Stand pivot transfers: Min assist, +2 physical assistance, +2 safety/equipment General transfer comment: VCs hand placement, mod A to power up, pt performed marching in place x 5 BLEs then took several pivotal steps to recliner. Trunk flexed throughout but able to come to ~15* from full upright position with increased time/cues.    Ambulation / Gait / Stairs / Wheelchair Mobility  Ambulation/Gait Ambulation/Gait assistance: Min assist, +2 safety/equipment Gait Distance (Feet): 10 Feet Assistive device: Rolling walker (2 wheeled) Gait Pattern/deviations: Step-to pattern, Step-through pattern General Gait Details: slow to step but steady with  RW. Gait velocity: decr    Posture / Balance Dynamic Sitting Balance Sitting balance - Comments: Pt remains guarded with EOB sitting and with decreased Bil knee flexion to support sitting balance. Balance Overall balance assessment: Needs assistance Sitting-balance support: No upper extremity supported Sitting balance-Leahy Scale: Fair Sitting balance - Comments: Pt remains guarded with EOB sitting and with decreased Bil knee flexion to support sitting balance. Standing balance support: Bilateral upper extremity supported Standing balance-Leahy Scale: Poor Standing balance comment: reliant on external assist    Special needs/care consideration Continuous Drip IV  Yes has IV and on heparin drip, Has Colostomy bag Skin Open post op abdominal wounds, JP drain RUQ, and Special service needs IV pain meds with BID dressing changes.    Previous Home Environment (from acute therapy documentation) Living Arrangements: Spouse/significant other  Lives With: Spouse Available Help at Discharge: Family Type of Home: House Home Layout: Two level, Able to live on main level with bedroom/bathroom Home Access: Stairs to enter Entrance Stairs-Rails: Right, Left Entrance Stairs-Number of Steps: 4 Bathroom Shower/Tub: Multimedia programmer: Opal: No  Discharge Living Setting Plans for Discharge Living Setting: House, Lives with (comment) (Lives with wife.) Type of Home at Discharge: House Discharge Home Layout: Two level, Able to live on main level with bedroom/bathroom Alternate Level Stairs-Number of Steps: Flight Discharge Home Access: Stairs to enter Entrance Stairs-Rails: Right, Left, Can reach both Entrance Stairs-Number of Steps: 3-4 Discharge Bathroom Shower/Tub: Walk-in shower, Door Discharge Bathroom Toilet: Standard Discharge Bathroom Accessibility: Yes How Accessible: Accessible via wheelchair, Accessible via walker Does the patient have any problems  obtaining your medications?: No  Social/Family/Support Systems Patient Roles: Spouse Contact Information: Isac Caddy - wife Anticipated Caregiver: wife Anticipated Caregiver's Contact Information: Isac Caddy - wife - [phone] Ability/Limitations of Caregiver: Wife has her own business but can provide supervision after discharge. Caregiver Availability: 24/7 Discharge Plan Discussed with Primary Caregiver: Yes Is Caregiver In Agreement with Plan?: Yes Does Caregiver/Family have Issues with Lodging/Transportation while Pt is in Rehab?: No  Goals Patient/Family Goal for Rehab: PT/OT supervision to mod I goals Expected length of stay: 12-14 days Cultural Considerations: None Pt/Family Agrees to Admission and willing to participate: Yes Program Orientation Provided & Reviewed with Pt/Caregiver Including Roles  & Responsibilities: Yes  Decrease burden of Care through IP rehab admission: N/A  Possible need for SNF placement upon discharge: Not anticipated  Patient Condition: I have reviewed medical records from [facility], spoken with CM, and spouse. I discussed via phone for inpatient rehabilitation assessment.  Patient  will benefit from ongoing PT and OT, can actively participate in 3 hours of therapy a day 5 days of the week, and can make measurable gains during the admission.  Patient will also benefit from the coordinated team approach during an Inpatient Acute Rehabilitation admission.  The patient will receive intensive therapy as well as Rehabilitation physician, nursing, social worker, and care management interventions.  Due to bladder management, bowel management, safety, skin/wound care, disease management, medication administration, pain management, and patient education the patient requires 24 hour a day rehabilitation nursing.  The patient is currently minimum- mod+2 assist with mobility and basic ADLs.  Discharge setting and therapy post discharge at home with home  health is anticipated.  Patient has agreed to participate in the Acute Inpatient Rehabilitation Program and will admit today.  Preadmission Screen Completed By:  Genella Mech, 09/01/2020 8:19 AM ______________________________________________________________________   Discussed status with Dr. Ranell Patrick  on 09/01/20 at 74 and received approval for admission today.  Admission Coordinator:  Genella Mech, CCC-SLP, time 930/Date 09/01/20   Assessment/Plan: Diagnosis: Debility Does the need for close, 24 hr/day Medical supervision in concert with the patient's rehab needs make it unreasonable for this patient to be served in a less intensive setting? Yes Co-Morbidities requiring supervision/potential complications: perforation of colon, severe sepsis, hyperglycemia, pressure injury of skin, overweight (BMI 25.53), anemia Due to bladder management, bowel management, safety, skin/wound care, disease management, medication administration, pain management, and patient education, does the patient require 24 hr/day rehab nursing? Yes Does the patient require coordinated care of a physician, rehab nurse, PT, OT to address physical and functional deficits in the context of the above medical diagnosis(es)? Yes Addressing deficits in the following areas: balance, endurance, locomotion, strength, transferring, bowel/bladder control, bathing, dressing, feeding, grooming, toileting, and psychosocial support Can the patient actively participate in an intensive therapy program of at least 3 hrs of therapy 5 days a week? Yes The potential for patient to make measurable gains while on inpatient rehab is good Anticipated functional outcomes upon discharge from inpatient rehab: [facility] PT, [place] OT, independent SLP Estimated rehab length of stay to reach the above functional goals is: 2 weeks Anticipated discharge destination: Home 10. Overall Rehab/Functional Prognosis: good   MD Signature: Leeroy Cha, MD

## 2020-08-20 NOTE — Progress Notes (Signed)
Patient had a CBG of 66 and was given 4 oz of juice. Recheck CBG was 68. Another 4 oz of juice was given and recheck was 62. Patient refused to drink more juice so this nurse followed the hypoglycemia protocol and gave a half amp of D50. Recheck CBG was 80

## 2020-08-21 LAB — CBC
HCT: 23.6 % — ABNORMAL LOW (ref 39.0–52.0)
Hemoglobin: 7.7 g/dL — ABNORMAL LOW (ref 13.0–17.0)
MCH: 31.6 pg (ref 26.0–34.0)
MCHC: 32.6 g/dL (ref 30.0–36.0)
MCV: 96.7 fL (ref 80.0–100.0)
Platelets: 345 10*3/uL (ref 150–400)
RBC: 2.44 MIL/uL — ABNORMAL LOW (ref 4.22–5.81)
RDW: 16.6 % — ABNORMAL HIGH (ref 11.5–15.5)
WBC: 20.3 10*3/uL — ABNORMAL HIGH (ref 4.0–10.5)
nRBC: 0 % (ref 0.0–0.2)

## 2020-08-21 LAB — GLUCOSE, CAPILLARY
Glucose-Capillary: 155 mg/dL — ABNORMAL HIGH (ref 70–99)
Glucose-Capillary: 136 mg/dL — ABNORMAL HIGH (ref 70–99)
Glucose-Capillary: 187 mg/dL — ABNORMAL HIGH (ref 70–99)
Glucose-Capillary: 186 mg/dL — ABNORMAL HIGH (ref 70–99)
Glucose-Capillary: 135 mg/dL — ABNORMAL HIGH (ref 70–99)

## 2020-08-21 LAB — HEPARIN LEVEL (UNFRACTIONATED)
Heparin Unfractionated: 0.17 IU/mL — ABNORMAL LOW (ref 0.30–0.70)
Heparin Unfractionated: 0.33 IU/mL (ref 0.30–0.70)
Heparin Unfractionated: 0.23 IU/mL — ABNORMAL LOW (ref 0.30–0.70)

## 2020-08-21 MED ORDER — JUVEN PO PACK
1.0000 | PACK | Freq: Two times a day (BID) | ORAL | Status: DC
  Administered 2020-08-21 – 2020-09-01 (×22): 1 via ORAL
  Filled 2020-08-21 (×23): qty 1

## 2020-08-21 MED ORDER — ADULT MULTIVITAMIN W/MINERALS CH
1.0000 | ORAL_TABLET | Freq: Every day | ORAL | Status: DC
  Administered 2020-08-21 – 2020-09-01 (×12): 1 via ORAL
  Filled 2020-08-21 (×12): qty 1

## 2020-08-21 MED ORDER — BOOST / RESOURCE BREEZE PO LIQD CUSTOM
1.0000 | Freq: Two times a day (BID) | ORAL | Status: DC
  Administered 2020-08-21 – 2020-09-01 (×21): 1 via ORAL

## 2020-08-21 MED ORDER — HEPARIN BOLUS VIA INFUSION
2000.0000 [IU] | Freq: Once | INTRAVENOUS | Status: AC
  Administered 2020-08-21: 2000 [IU] via INTRAVENOUS
  Filled 2020-08-21: qty 2000

## 2020-08-21 MED ORDER — ENSURE ENLIVE PO LIQD
237.0000 mL | Freq: Two times a day (BID) | ORAL | Status: DC
  Administered 2020-08-21 – 2020-08-31 (×18): 237 mL via ORAL

## 2020-08-21 MED ORDER — LORAZEPAM 2 MG/ML IJ SOLN
INTRAMUSCULAR | Status: AC
  Filled 2020-08-21: qty 1

## 2020-08-21 NOTE — Progress Notes (Signed)
Nutrition Follow-up  DOCUMENTATION CODES:   Not applicable  INTERVENTION:  - will d/c Prosource Plus. - will increase Boost Breeze from once/day to BID. - will increase Ensure Enlive from once/day to BID. - will order Juven BID, each packet provides 95 calories, 2.5 grams of protein (collagen), and 9.8 grams of carbohydrate (3 grams sugar); also contains 7 grams of L-arginine and L-glutamine, 300 mg vitamin C, 15 mg vitamin E, 1.2 mcg vitamin B-12, 9.5 mg zinc, 200 mg calcium, and 1.5 g  Calcium Beta-hydroxy-Beta-methylbutyrate to support wound healing. - weigh patient today.    NUTRITION DIAGNOSIS:   Inadequate oral intake related to acute illness, decreased appetite as evidenced by meal completion < 50%. -ingoing  GOAL:   Patient will meet greater than or equal to 90% of their needs -unmet currently, progressing  MONITOR:   PO intake, Supplement acceptance, Labs, Weight trends   ASSESSMENT:   60 year old male with no known medical history. He presented to the ED at the direction of his PCP d/t LLQ abdominal pain and abdominal wound x2-3 weeks. Wound started as a small area, about the size of a silver dollar, and has progressively expanded and worsened. He reported decreased appetite and intermittent fevers over the last several weeks. Last BM was the day PTA. He started having issues with constipation in early June and he made diet adjustments with fiber supplementation. His wife reported 20 lb weight loss in the past 6 weeks.  Significant Events: 7/13- admission with dx of abdominal wall abscess, necrotizing soft tissue of abdominal wall, perforated diverticulitis with colocutaneous fistula; I&D LLQ abdominal wall, partial colectomy and end colostomy, and splenic mobilization; NGT placement 7/15- initial RD assessment; exploration for concern of leak and inspection of L flank, LOAs, SBR with anastomosis, enterorrhaphy x2, debridement of necrotic fat and fascia of abdominal  wall 7/16- placement of double lumen PICC in R basilic; debridement of abdominal wall with sharp dissection and pulsavac of wounds 7/18- NGT removal; diet advanced to CLD 7/19- diet advanced to FLD 7/20- diet advanced to Soft 7/21- TPN stopped    Recently documented intakes: 100% of breakfast and 60% of lunch on 7/20; 85% of breakfast and 90% of lunch on 7/21.  He has been accepting oral nutrition supplements nearly 100% of the time offered.  Able to talk with RN who reports patient is doing well with oral nutrition supplements and provides information about open areas on abdomen.   Patient laying in bed with no family or visitors at bedside. Patient reports very minimal abdominal discomfort. He was able to eat most of breakfast without any difficulty and has already ordered lunch.   He has not been weighed since 7/16. Mild pitting edema to BUE and moderate pitting edema to BLE; increased edema from yesterday.   Surgeon's note indicates patient is being evaluated by CIR.    Labs reviewed; CBGs: 155, 136, 187 mg/dl, Na: 131 mmol/l, Ca: 8.2 mg/dl. Medications reviewed; sliding scale novolog, 40 mg IV protonix/day.   Diet Order:   Diet Order             DIET SOFT Room service appropriate? Yes; Fluid consistency: Thin  Diet effective now                   EDUCATION NEEDS:   Not appropriate for education at this time  Skin:  Skin Assessment: Skin Integrity Issues: Skin Integrity Issues:: Incisions, Other (Comment) Incisions: abdomen (7/13, 7/15, 7/16) Other: open wounds to  R, L, and mid-line abdomen--doing wet-to-dry dressing changes with no plans for return to OR for closure  Last BM:  7/22 (400 ml via colostomy)  Height:   Ht Readings from Last 1 Encounters:  08/12/20 '5\' 10"'$  (1.778 m)    Weight:   Wt Readings from Last 1 Encounters:  08/15/20 86.1 kg      Estimated Nutritional Needs:  Kcal:  2500-2700 kcal Protein:  140-155 grams Fluid:  >/= 2.5  L/day      Jarome Matin, MS, RD, LDN, CNSC Inpatient Clinical Dietitian RD pager # available in AMION  After hours/weekend pager # available in [facility]

## 2020-08-21 NOTE — Progress Notes (Signed)
ANTICOAGULATION CONSULT NOTE   Pharmacy Consult for Heparin Indication: DVT  No Known Allergies  Patient Measurements: Height: '5\' 10"'$  (177.8 cm) Weight: 86.1 kg (189 lb 13.1 oz) IBW/kg (Calculated) : 73 Heparin Dosing Weight: 86 kg  Vital Signs: Temp: 99 F (37.2 C) (07/22 1201) Temp Source: Oral (07/22 1201) BP: 131/73 (07/22 1200) Pulse Rate: 94 (07/22 1200)  Labs: Recent Labs    08/19/20 0500 08/19/20 1236 08/20/20 0755 08/20/20 1922 08/21/20 0249 08/21/20 1147  HGB  --   --  7.9*  --  7.7*  --   HCT  --   --  24.0*  --  23.6*  --   PLT  --   --  365  --  345  --   HEPARINUNFRC  --   --   --  0.15* 0.17* 0.33  CREATININE 0.57*  0.55* SPECIMEN CONTAMINATED, UNABLE TO PERFORM TEST(S). 0.70  --   --   --      Estimated Creatinine Clearance: 101.4 mL/min (by C-G formula based on SCr of 0.7 mg/dL).   Medical History: History reviewed. No pertinent past medical history.  Medications:  Scheduled:   (feeding supplement) PROSource Plus  30 mL Oral Daily   Chlorhexidine Gluconate Cloth  6 each Topical Daily   feeding supplement  1 Container Oral Q24H   feeding supplement  237 mL Oral Q24H   insulin aspart  0-15 Units Subcutaneous TID WC   mouth rinse  15 mL Mouth Rinse BID   methocarbamol  1,000 mg Oral TID   pantoprazole (PROTONIX) IV  40 mg Intravenous Q12H   sodium chloride flush  10-40 mL Intracatheter Q12H   Infusions:   sodium chloride Stopped (08/14/20 0427)   sodium chloride Stopped (08/17/20 1800)   heparin 1,900 Units/hr (08/21/20 0807)   piperacillin-tazobactam (ZOSYN)  IV 3.375 g (08/21/20 1032)    Assessment: EC fistula, 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 PICC line RUE, with new DVT  08/21/2020: 1147 Heparin level 0.33- therapeutic on IV heparin 1900 units/hr CBC: Hg 7.7- low; pltc WNL RN reports no bleeding or infusion related concerns  Goal of Therapy:  Heparin level 0.3-0.7 units/ml Monitor platelets by anticoagulation protocol: Yes   Plan:   Continue heparin infusion at 1900 units/hr Obtain confirmatory 6 hr heparin level Monitor daily heparin level, CBC, s/s bleeding   Lilburn Straw P. Legrand Como, PharmD, [place] Please utilize Amion for appropriate phone number to reach the unit pharmacist ([place]) 08/21/2020 12:15 PM

## 2020-08-21 NOTE — Progress Notes (Signed)
Inpatient Rehab Admissions Coordinator:    Pt. Does not appear medically ready for CIR at this time, but may be ready by Monday. I will check on the Pt. And initiate insurance auth if Pt. Appears medically ready on Monday.   Clemens Catholic, [place], [place] Admissions Coordinator  [phone] ([place]) [phone] (office)

## 2020-08-21 NOTE — Consult Note (Signed)
[facility] Nurse ostomy follow up Stoma type/location: LUQ colostomy Stomal assessment/size: 1 and 5/8 inches round, red, moist. Peristomal assessment: Intact.  Deep depressions on either side.  Treatment options for stomal/peristomal skin: Two skin barrier rings (standard) placed again today. Pouch applied Monday is intact. Output 400 mls dark brown thin stool Ostomy pouching: 2pc. 2 and 3/4 inch with 2 skin barrier rings.  Consider convex skin barrier down stream. Patient would benefit from continuing 2-piece with accessories such as a closed end pouch for exercise and intimacy. Education provided: Patient, SO Neoma Laming), Bedside RN and orienting RN in room for teaching session.  Explained stoma characteristics (budded, flush, color, texture, care) Demonstrated pouch change (cutting new skin barrier, measuring stoma, cleaning peristomal skin and stoma, use of barrier ring) Education on emptying when 1/3 to 1/2 full and how to empty Demonstrated use of wick to clean spout  Answered patient/family questions.   Patient and SO can perform Lock and Roll closure.  SO cut skin barrier today a little larger than pattern.   Enrolled patient in [place] Discharge program: Yes, previously.  Consider decreasing size of supplies to 2 and 1/4 inch when appropriate.  Supplies and educational booklet in room (2 and 3/4 inch).  Consider taking 2 and 1/4 inch product in for next visit to see if he can use that in the future.  [facility] nursing team will not follow, but will remain available to this patient, the nursing and medical teams.  Please re-consult if needed. Thanks, Maudie Flakes, MSN, RN, [place], Arther Abbott  Pager# [phone]

## 2020-08-21 NOTE — Progress Notes (Signed)
T cancel PT Cancellation Note  Patient Details Name: Roger Brennan MRN: [id number] DOB: [birth year]   Cancelled Treatment:     Started Heparin with levels just shy therapeutic    Will hold off again today.    Nathanial Rancher 08/21/2020, 2:38 PM

## 2020-08-21 NOTE — TOC Progression Note (Signed)
Transition of Care [facility]) - Progression Note    Patient Details  Name: Roger Brennan MRN: [id number] Date of Birth: Aug 12, 1960  Transition of Care [facility]) CM/SW Contact  Leeroy Cha, RN Phone Number: 08/21/2020, 7:41 AM  Clinical Narrative:    Surgery note: DVT of the axillary vein around patients PICC, SVT of the basilic vein surrounding patients PICC and R gastroc DVT. Start IV heparin (hgb stable). Toc plan of care: Awaiting insurance auth from [place] for CIR.  Expected Discharge Plan: Home/Self Care Barriers to Discharge: No [place] will accept this patient  Expected Discharge Plan and Services Expected Discharge Plan: Home/Self Care   Discharge Planning Services: CM Consult   Living arrangements for the past 2 months: Single Family Home                                       Social Determinants of Health (SDOH) Interventions    Readmission Risk Interventions No flowsheet data found.

## 2020-08-21 NOTE — Progress Notes (Signed)
ANTICOAGULATION CONSULT NOTE   Pharmacy Consult for Heparin Indication: DVT  No Known Allergies  Patient Measurements: Height: '5\' 10"'$  (177.8 cm) Weight: 86.1 kg (189 lb 13.1 oz) IBW/kg (Calculated) : 73 Heparin Dosing Weight: 86 kg  Vital Signs: Temp: 98 F (36.7 C) (07/22 0325) Temp Source: Axillary (07/22 0325) BP: 127/74 (07/22 0400) Pulse Rate: 85 (07/22 0400)  Labs: Recent Labs    08/18/20 0833 08/19/20 0500 08/19/20 1236 08/20/20 0755 08/20/20 1922 08/21/20 0249  HGB 8.1*  --   --  7.9*  --  7.7*  HCT 24.8*  --   --  24.0*  --  23.6*  PLT 289  --   --  365  --  345  HEPARINUNFRC  --   --   --   --  0.15* 0.17*  CREATININE  --  0.57*  0.55* SPECIMEN CONTAMINATED, UNABLE TO PERFORM TEST(S). 0.70  --   --      Estimated Creatinine Clearance: 101.4 mL/min (by C-G formula based on SCr of 0.7 mg/dL).   Medical History: History reviewed. No pertinent past medical history.  Medications:  Scheduled:   (feeding supplement) PROSource Plus  30 mL Oral Daily   Chlorhexidine Gluconate Cloth  6 each Topical Daily   feeding supplement  1 Container Oral Q24H   feeding supplement  237 mL Oral Q24H   insulin aspart  0-15 Units Subcutaneous TID WC   mouth rinse  15 mL Mouth Rinse BID   methocarbamol  1,000 mg Oral TID   pantoprazole (PROTONIX) IV  40 mg Intravenous Q12H   sodium chloride flush  10-40 mL Intracatheter Q12H   Infusions:   sodium chloride Stopped (08/14/20 0427)   sodium chloride Stopped (08/17/20 1800)   heparin 1,650 Units/hr (08/21/20 0400)   piperacillin-tazobactam (ZOSYN)  IV Stopped (08/21/20 0555)    Assessment: EC fistula, 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 PICC line RUE, with new DVT  08/21/2020: Heparin level 0.17- subtherapeutic on IV heparin 1650 units/hr CBC: Hg 7.7- low; pltc WNL RN reports no bleeding or infusion related concerns  Goal of Therapy:  Heparin level 0.3-0.7 units/ml Monitor platelets by anticoagulation protocol: Yes   Plan:   IV Heparin 2000 units IV bolus x1 Increase Heparin infusion to 1900 units/h Check HL six hr after Heparin started  Daily heparin level & CBC while on heparin  Netta Cedars PharmD 08/21/2020,4:23 AM

## 2020-08-21 NOTE — Progress Notes (Signed)
6 Days Post-Op   Subjective/Chief Complaint: No complaints   Objective: Vital signs in last 24 hours: Temp:  [97.7 F (36.5 C)-98.1 F (36.7 C)] 97.7 F (36.5 C) (07/22 0807) Pulse Rate:  [78-114] 85 (07/22 0900) Resp:  [9-19] 18 (07/22 0900) BP: (119-157)/(63-111) 140/72 (07/22 0900) SpO2:  [94 %-100 %] 98 % (07/22 0900) Last BM Date: 08/21/20  Intake/Output from previous day: 07/21 0701 - 07/22 0700 In: 1045.2 [P.O.:440; I.V.:296.9; IV Piggyback:298.3] Out: 1510 [Urine:1125; Drains:85; Stool:300] Intake/Output this shift: Total I/O In: 40.2 [I.V.:40.2] Out: 520 [Urine:520]  General appearance: alert and cooperative Resp: clear to auscultation bilaterally Cardio: regular rate and rhythm GI: soft, nontender. Wounds clean. Ostomy productive  Lab Results:  Recent Labs    08/20/20 0755 08/21/20 0249  WBC 20.7* 20.3*  HGB 7.9* 7.7*  HCT 24.0* 23.6*  PLT 365 345   BMET Recent Labs    08/19/20 1236 08/20/20 0755  NA SPECIMEN CONTAMINATED, UNABLE TO PERFORM TEST(S). 131*  K SPECIMEN CONTAMINATED, UNABLE TO PERFORM TEST(S). 4.0  CL SPECIMEN CONTAMINATED, UNABLE TO PERFORM TEST(S). 99  CO2 SPECIMEN CONTAMINATED, UNABLE TO PERFORM TEST(S). 23  GLUCOSE SPECIMEN CONTAMINATED, UNABLE TO PERFORM TEST(S). 116*  BUN SPECIMEN CONTAMINATED, UNABLE TO PERFORM TEST(S). 14  CREATININE SPECIMEN CONTAMINATED, UNABLE TO PERFORM TEST(S). 0.70  CALCIUM SPECIMEN CONTAMINATED, UNABLE TO PERFORM TEST(S). 8.2*   PT/INR No results for input(s): LABPROT, INR in the last 72 hours. ABG No results for input(s): PHART, HCO3 in the last 72 hours.  Invalid input(s): PCO2, PO2  Studies/Results: VAS [place] LOWER EXTREMITY VENOUS (DVT)  Result Date: 08/20/2020  Lower Venous DVT Study Patient Name:  Roger Brennan  Date of Exam:   08/20/2020 Medical Rec #: [id number]        Accession #:    [id number] Date of Birth: [birth year]         Patient Gender: M Patient Age:   60Y Exam Location:  [facility] Procedure:      VAS [place] LOWER EXTREMITY VENOUS (DVT) Referring Phys: [id number] [place] --------------------------------------------------------------------------------  Indications: Edema.  Risk Factors: None identified. Comparison Study: No prior studies. Performing Technologist: Oliver Hum RVT  Examination Guidelines: A complete evaluation includes B-mode imaging, spectral Doppler, color Doppler, and power Doppler as needed of all accessible portions of each vessel. Bilateral testing is considered an integral part of a complete examination. Limited examinations for reoccurring indications may be performed as noted. The reflux portion of the exam is performed with the patient in reverse Trendelenburg.  +---------+---------------+---------+-----------+----------+--------------+ RIGHT    CompressibilityPhasicitySpontaneityPropertiesThrombus Aging +---------+---------------+---------+-----------+----------+--------------+ CFV      Full           Yes      Yes                                 +---------+---------------+---------+-----------+----------+--------------+ SFJ      Full                                                        +---------+---------------+---------+-----------+----------+--------------+ FV Prox  Full                                                        +---------+---------------+---------+-----------+----------+--------------+  FV Mid   Full                                                        +---------+---------------+---------+-----------+----------+--------------+ FV DistalFull                                                        +---------+---------------+---------+-----------+----------+--------------+ PFV      Full                                                        +---------+---------------+---------+-----------+----------+--------------+ POP      Full           Yes      Yes                                  +---------+---------------+---------+-----------+----------+--------------+ PTV      Full                                                        +---------+---------------+---------+-----------+----------+--------------+ PERO     Full                                                        +---------+---------------+---------+-----------+----------+--------------+ Gastroc  Partial                                      Acute          +---------+---------------+---------+-----------+----------+--------------+   +---------+---------------+---------+-----------+----------+--------------+ LEFT     CompressibilityPhasicitySpontaneityPropertiesThrombus Aging +---------+---------------+---------+-----------+----------+--------------+ CFV      Full           Yes      Yes                                 +---------+---------------+---------+-----------+----------+--------------+ SFJ      Full                                                        +---------+---------------+---------+-----------+----------+--------------+ FV Prox  Full                                                        +---------+---------------+---------+-----------+----------+--------------+  FV Mid   Full                                                        +---------+---------------+---------+-----------+----------+--------------+ FV DistalFull                                                        +---------+---------------+---------+-----------+----------+--------------+ PFV      Full                                                        +---------+---------------+---------+-----------+----------+--------------+ POP      Full           Yes      Yes                                 +---------+---------------+---------+-----------+----------+--------------+ PTV      Full                                                         +---------+---------------+---------+-----------+----------+--------------+ PERO     Full                                                        +---------+---------------+---------+-----------+----------+--------------+     Summary: RIGHT: - Findings consistent with acute deep vein thrombosis involving the right gastrocnemius veins. - No cystic structure found in the popliteal fossa.  LEFT: - There is no evidence of deep vein thrombosis in the lower extremity.  - No cystic structure found in the popliteal fossa.  *See table(s) above for measurements and observations. Electronically signed by Monica Martinez MD on 08/20/2020 at 2:28:06 PM.    Final    VAS [place] UPPER EXTREMITY VENOUS DUPLEX  Result Date: 08/20/2020 UPPER VENOUS STUDY  Patient Name:  VERE PELLETT  Date of Exam:   08/20/2020 Medical Rec #: [id number]        Accession #:    [id number] Date of Birth: 10-14-1960         Patient Gender: M Patient Age:   60Y Exam Location:  [facility] Procedure:      VAS [place] UPPER EXTREMITY VENOUS DUPLEX Referring Phys: [id number] [place] --------------------------------------------------------------------------------  Indications: Edema Risk Factors: None identified. Limitations: Bandages and line. Comparison Study: No prior studies. Performing Technologist: Oliver Hum RVT  Examination Guidelines: A complete evaluation includes B-mode imaging, spectral Doppler, color Doppler, and power Doppler as needed of all accessible portions of each vessel. Bilateral testing is considered an integral part of a complete examination. Limited examinations for reoccurring indications may be performed as noted.  Right Findings: +----------+------------+---------+-----------+----------+-------+ RIGHT     CompressiblePhasicitySpontaneousPropertiesSummary +----------+------------+---------+-----------+----------+-------+ IJV           Full       Yes       Yes                       +----------+------------+---------+-----------+----------+-------+ Subclavian    Full       Yes       Yes                      +----------+------------+---------+-----------+----------+-------+ Axillary      None       No        No                Acute  +----------+------------+---------+-----------+----------+-------+ Brachial      Full       Yes       Yes                      +----------+------------+---------+-----------+----------+-------+ Radial        Full                                          +----------+------------+---------+-----------+----------+-------+ Ulnar         Full                                          +----------+------------+---------+-----------+----------+-------+ Cephalic      Full                                          +----------+------------+---------+-----------+----------+-------+ Basilic       None                                   Acute  +----------+------------+---------+-----------+----------+-------+ DVT found in the axillary vein noted to be in the distal segment and surrounds the patient's PICC line. SVT found in the basilic vein is surrounding the patient's PICC line.  Left Findings: +----------+------------+---------+-----------+----------+-------+ LEFT      CompressiblePhasicitySpontaneousPropertiesSummary +----------+------------+---------+-----------+----------+-------+ IJV           Full       Yes       Yes                      +----------+------------+---------+-----------+----------+-------+ Subclavian    Full       Yes       Yes                      +----------+------------+---------+-----------+----------+-------+ Axillary      Full       Yes       Yes                      +----------+------------+---------+-----------+----------+-------+ Brachial      Full       Yes       Yes                      +----------+------------+---------+-----------+----------+-------+ Radial  Full                                          +----------+------------+---------+-----------+----------+-------+ Ulnar         Full                                          +----------+------------+---------+-----------+----------+-------+ Cephalic      Full                                          +----------+------------+---------+-----------+----------+-------+ Basilic       None                                   Acute  +----------+------------+---------+-----------+----------+-------+ SVT found in the basilic vein is noted to be in the antecubital fossa.  *See table(s) above for measurements and observations.  Diagnosing physician: Monica Martinez MD Electronically signed by Monica Martinez MD on 08/20/2020 at 2:29:10 PM.    Final     Anti-infectives: Anti-infectives (From admission, onward)    Start     Dose/Rate Route Frequency Ordered Stop   08/15/20 1000  anidulafungin (ERAXIS) 100 mg in sodium chloride 0.9 % 100 mL IVPB        100 mg 78 mL/hr over 100 Minutes Intravenous Every 24 hours 08/14/20 1349 08/20/20 1326   08/14/20 1600  anidulafungin (ERAXIS) 200 mg in sodium chloride 0.9 % 200 mL IVPB        200 mg 78 mL/hr over 200 Minutes Intravenous  Once 08/14/20 1349 08/14/20 2000   08/14/20 1000  vancomycin (VANCOREADY) IVPB 1500 mg/300 mL  Status:  Discontinued        1,500 mg 150 mL/hr over 120 Minutes Intravenous Every 24 hours 08/13/20 1009 08/14/20 0901   08/13/20 1000  vancomycin (VANCOREADY) IVPB 1250 mg/250 mL  Status:  Discontinued        1,250 mg 166.7 mL/hr over 90 Minutes Intravenous Every 24 hours 08/12/20 1910 08/13/20 1009   08/12/20 2200  piperacillin-tazobactam (ZOSYN) IVPB 3.375 g  Status:  Discontinued        3.375 g 12.5 mL/hr over 240 Minutes Intravenous Every 8 hours 08/12/20 1904 08/12/20 1941   08/12/20 2000  clindamycin (CLEOCIN) IVPB 600 mg  Status:  Discontinued        600 mg 100 mL/hr over 30 Minutes Intravenous Every 8 hours  08/12/20 1757 08/17/20 0844   08/12/20 1830  piperacillin-tazobactam (ZOSYN) IVPB 3.375 g        3.375 g 12.5 mL/hr over 240 Minutes Intravenous Every 8 hours 08/12/20 1811     08/12/20 1015  vancomycin (VANCOREADY) IVPB 1500 mg/300 mL        1,500 mg 150 mL/hr over 120 Minutes Intravenous  Once 08/12/20 1007 08/12/20 1330   08/12/20 1000  ceFEPIme (MAXIPIME) 2 g in sodium chloride 0.9 % 100 mL IVPB        2 g 200 mL/hr over 30 Minutes Intravenous  Once 08/12/20 1000 08/12/20 1121   08/12/20 1000  metroNIDAZOLE (FLAGYL) IVPB 500 mg        500 mg 100  mL/hr over 60 Minutes Intravenous  Once 08/12/20 1000 08/12/20 1226   08/12/20 1000  vancomycin (VANCOCIN) IVPB 1000 mg/200 mL premix  Status:  Discontinued        1,000 mg 200 mL/hr over 60 Minutes Intravenous  Once 08/12/20 1000 08/12/20 1007       Assessment/Plan: s/p Procedure(s): REPAIR ABDOMINAL WALL/REEXPLORATION OF ABDOMINAL WALL, IRRIGATION AND DEBRIDEMENT (N/A) Advance diet POD 9 s/p drainage and debridement of LLQ abdominal wall 15 x 20 cm, partial colectomy with end colostomy, Splenic mobilization by Dr. Kieth Brightly on 08/12/20 for abdominal wall abscess, diverticulitis POD 7 s/p reopening of recent laparotomy, lysis of adhesions for 45 min. Small bowel resection with anastomosis, enterrorhaphy x 2, debridement of 200 cm^2 of necrotic fat and fascia of abdominal wall by Dr. Kieth Brightly on 08/14/20 for intestinal injury POD 6 s/p Debridement of abdominal wall with above dimensions with sharp dissection and Pulsavac of wounds by Dr. Brantley Stage on 7/16 for Open abdominal wall wounds left flank, right lower quadrant total area 738 cm ^2  - The area left lower quadrants was 20 cm x 30 cm and midline 20 cm x 6 cm and right lower quadrant 6 x 3 cm - Off TPN. On soft diet. Nutrition following. - Colostomy functioning - Cont wound care. BID WTD. Monitor LLQ wound where I bluntly opened false bottom 7/21. - WOCN following - Considering weaning  PCA - Cont abx. Comp 7d Eraxis, cont Zosyn for 14d total - Cont drain - Consider transfer out of the unit today if labs look okay - Mobilize. PT/OT rec CIR - Pulm toilet - Path 7/13 w/ diverticulitis - no evidence of malignancy    FEN - Soft, SLIV (net +10L since admission) VTE - SCDs, heparin subq - check UE and LE duplex given asymmetric edema ID - Currently on Zosyn. Bcx negative.  Foley - Out, voiding Dispo - CIR following. Recheck labs. Dopplers. Consider moving out of the unit and weaning PCA later today.    Hyperkalemia - Last K 6.0. Question if inaccurate given large change from prior labs. Recheck now. If > 5.5 will order EKG, Calcium Gluconate, Lasix etc Hyponatremia - Last NA 122. Question if inaccurate given large change from prior labs. Recheck now Hyperglycemia - stable ABL anemia - last hgb 8.1. recheck now. Dark/melanous like stool today.  Protein Calorie Malnutrition - Pre-alb < 5 -> 11.4. Weaning TPN, on diet. Nutrition following for supplements.   LOS: 9 days    Roger Brennan 08/21/2020

## 2020-08-21 NOTE — Progress Notes (Signed)
ANTICOAGULATION CONSULT NOTE   Pharmacy Consult for Heparin Indication: DVT  No Known Allergies  Patient Measurements: Height: '5\' 10"'$  (177.8 cm) Weight: 79.8 kg (175 lb 14.8 oz) IBW/kg (Calculated) : 73 Heparin Dosing Weight: 86 kg  Vital Signs: Temp: 99.1 F (37.3 C) (07/22 1600) Temp Source: Oral (07/22 1600) BP: 142/73 (07/22 1800) Pulse Rate: 91 (07/22 1800)  Labs: Recent Labs    08/19/20 0500 08/19/20 1236 08/20/20 0755 08/20/20 1922 08/21/20 0249 08/21/20 1147 08/21/20 1854  HGB  --   --  7.9*  --  7.7*  --   --   HCT  --   --  24.0*  --  23.6*  --   --   PLT  --   --  365  --  345  --   --   HEPARINUNFRC  --   --   --    < > 0.17* 0.33 0.23*  CREATININE 0.57*  0.55* SPECIMEN CONTAMINATED, UNABLE TO PERFORM TEST(S). 0.70  --   --   --   --    < > = values in this interval not displayed.     Estimated Creatinine Clearance: 101.4 mL/min (by C-G formula based on SCr of 0.7 mg/dL).   Medical History: History reviewed. No pertinent past medical history.  Medications:  Scheduled:   Chlorhexidine Gluconate Cloth  6 each Topical Daily   feeding supplement  1 Container Oral BID BM   feeding supplement  237 mL Oral BID BM   insulin aspart  0-15 Units Subcutaneous TID WC   LORazepam       mouth rinse  15 mL Mouth Rinse BID   methocarbamol  1,000 mg Oral TID   multivitamin with minerals  1 tablet Oral Daily   nutrition supplement (JUVEN)  1 packet Oral BID BM   pantoprazole (PROTONIX) IV  40 mg Intravenous Q12H   sodium chloride flush  10-40 mL Intracatheter Q12H   Infusions:   sodium chloride Stopped (08/14/20 0427)   sodium chloride Stopped (08/17/20 1800)   heparin 1,900 Units/hr (08/21/20 1842)   piperacillin-tazobactam (ZOSYN)  IV 12.5 mL/hr at 08/21/20 1842    Assessment: EC fistula, 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 PICC line RUE, with new DVT  08/21/2020 7:49 PM  HL 0.23, sub-therapeutic on IV heparin 1900 units/hr CBC: Hg 7.7- low; pltc WNL RN reports  no bleeding or infusion related concerns  Goal of Therapy:  Heparin level 0.3-0.7 units/ml Monitor platelets by anticoagulation protocol: Yes   Plan:  Increase heparin infusion to 2050 units/hr  Recheck HL in 6 hours Monitor daily heparin level, CBC, s/s bleeding   Napoleon Form  08/21/2020 7:49 PM

## 2020-08-22 LAB — CBC
HCT: 17 % — ABNORMAL LOW (ref 39.0–52.0)
Hemoglobin: 5.6 g/dL — CL (ref 13.0–17.0)
MCH: 31.5 pg (ref 26.0–34.0)
MCHC: 32.9 g/dL (ref 30.0–36.0)
MCV: 95.5 fL (ref 80.0–100.0)
Platelets: 338 10*3/uL (ref 150–400)
RBC: 1.78 MIL/uL — ABNORMAL LOW (ref 4.22–5.81)
RDW: 16.8 % — ABNORMAL HIGH (ref 11.5–15.5)
WBC: 15.4 10*3/uL — ABNORMAL HIGH (ref 4.0–10.5)
nRBC: 0 % (ref 0.0–0.2)

## 2020-08-22 LAB — HEPARIN LEVEL (UNFRACTIONATED)
Heparin Unfractionated: >1.1 IU/mL — ABNORMAL HIGH (ref 0.30–0.70)
Heparin Unfractionated: 0.34 IU/mL (ref 0.30–0.70)
Heparin Unfractionated: 0.21 IU/mL — ABNORMAL LOW (ref 0.30–0.70)

## 2020-08-22 LAB — GLUCOSE, CAPILLARY
Glucose-Capillary: 208 mg/dL — ABNORMAL HIGH (ref 70–99)
Glucose-Capillary: 179 mg/dL — ABNORMAL HIGH (ref 70–99)
Glucose-Capillary: 145 mg/dL — ABNORMAL HIGH (ref 70–99)
Glucose-Capillary: 212 mg/dL — ABNORMAL HIGH (ref 70–99)
Glucose-Capillary: 172 mg/dL — ABNORMAL HIGH (ref 70–99)
Glucose-Capillary: 187 mg/dL — ABNORMAL HIGH (ref 70–99)

## 2020-08-22 LAB — PREPARE RBC (CROSSMATCH)

## 2020-08-22 LAB — HEMOGLOBIN AND HEMATOCRIT, BLOOD
HCT: 24.5 % — ABNORMAL LOW (ref 39.0–52.0)
Hemoglobin: 8.1 g/dL — ABNORMAL LOW (ref 13.0–17.0)

## 2020-08-22 MED ORDER — SODIUM CHLORIDE 0.9% IV SOLUTION: Freq: Once | INTRAVENOUS | Status: AC

## 2020-08-22 MED ORDER — HEPARIN (PORCINE) 25000 UT/250ML-% IV SOLN
2200.0000 [IU]/h | INTRAVENOUS | Status: DC
  Administered 2020-08-22 – 2020-08-23 (×2): 2200 [IU]/h via INTRAVENOUS
  Filled 2020-08-22 (×3): qty 250

## 2020-08-22 NOTE — Progress Notes (Signed)
Report called to [facility]. All questions answered at this time. All pt belongings and paper chart transferred with pt. Pt was transported in the bed by RN and NT. Patient's wife notified by RN. 6E will continue to care for pt.

## 2020-08-22 NOTE — Progress Notes (Signed)
[place] for Heparin Indication: DVT  No Known Allergies  Patient Measurements: Height: '5\' 10"'$  (177.8 cm) Weight: 79.8 kg (175 lb 14.8 oz) IBW/kg (Calculated) : 73 Heparin Dosing Weight: 86 kg  Vital Signs: Temp: 98.1 F (36.7 C) (07/22 2300) Temp Source: Oral (07/22 2300) BP: 129/64 (07/23 0600) Pulse Rate: 85 (07/23 0700)  Labs: Recent Labs     0000 08/19/20 1236 08/20/20 0755 08/20/20 1922 08/21/20 0249 08/21/20 1147 08/21/20 1854 08/22/20 0415 08/22/20 0636  HGB   < >  --  7.9*  --  7.7*  --   --  5.6*  --   HCT  --   --  24.0*  --  23.6*  --   --  17.0*  --   PLT  --   --  365  --  345  --   --  338  --   HEPARINUNFRC  --   --   --    < > 0.17*   < > 0.23* >1.10* 0.34  CREATININE  --  SPECIMEN CONTAMINATED, UNABLE TO PERFORM TEST(S). 0.70  --   --   --   --   --   --    < > = values in this interval not displayed.     Estimated Creatinine Clearance: 101.4 mL/min (by C-G formula based on SCr of 0.7 mg/dL).   Medical History: History reviewed. No pertinent past medical history.  Medications:  Scheduled:   sodium chloride   Intravenous Once   Chlorhexidine Gluconate Cloth  6 each Topical Daily   feeding supplement  1 Container Oral BID BM   feeding supplement  237 mL Oral BID BM   insulin aspart  0-15 Units Subcutaneous TID WC   mouth rinse  15 mL Mouth Rinse BID   methocarbamol  1,000 mg Oral TID   multivitamin with minerals  1 tablet Oral Daily   nutrition supplement (JUVEN)  1 packet Oral BID BM   pantoprazole (PROTONIX) IV  40 mg Intravenous Q12H   sodium chloride flush  10-40 mL Intracatheter Q12H   Infusions:   sodium chloride Stopped (08/14/20 0427)   sodium chloride Stopped (08/17/20 1800)   heparin 2,050 Units/hr (08/22/20 0639)   piperacillin-tazobactam (ZOSYN)  IV Stopped (08/22/20 0725)    Assessment: EC fistula, 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 PICC line RUE, with new DVT  08/22/2020 7:41 AM  0415 HL  falsely elevated, level drawn from port 0636 HL 0.34, therapeutic on IV heparin 2050 units/hr CBC: Hgb down to 5.6; 2 units to be transfused today.  Pltc WNL RN reports no bleeding or infusion related concerns  Goal of Therapy:  Heparin level 0.3-0.7 units/ml Monitor platelets by anticoagulation protocol: Yes   Plan:  Continue heparin infusion for now per Dr Marlou Starks Continue heparin infusion at 2050 units/hr  Recheck HL in 6 hours Monitor daily heparin level, CBC, s/s bleeding   Karmen Altamirano P. Legrand Como, PharmD, [place] Please utilize Amion for appropriate phone number to reach the unit pharmacist ([place]) 08/22/2020 7:43 AM

## 2020-08-22 NOTE — Progress Notes (Signed)
Pt refused dressing change on night shift. Request to have it changed on day shift.

## 2020-08-22 NOTE — Progress Notes (Signed)
Date and time results received: 08/22/20   Test: Hemoglobin  Critical Value: 5.6  Name of Provider Notified: Coralie Keens   Orders Received?  2 units of RBC

## 2020-08-22 NOTE — Progress Notes (Signed)
[place] for Heparin Indication: DVT  No Known Allergies  Patient Measurements: Height: '5\' 10"'$  (177.8 cm) Weight: 79.8 kg (175 lb 14.8 oz) IBW/kg (Calculated) : 73 Heparin Dosing Weight: 86 kg  Vital Signs: Temp: 98.3 F (36.8 C) (07/23 1636) Temp Source: Oral (07/23 1636) BP: 133/75 (07/23 1800) Pulse Rate: 100 (07/23 1800)  Labs: Recent Labs    08/20/20 0755 08/20/20 1922 08/21/20 0249 08/21/20 1147 08/22/20 0415 08/22/20 0636 08/22/20 1833  HGB 7.9*  --  7.7*  --  5.6*  --  8.1*  HCT 24.0*  --  23.6*  --  17.0*  --  24.5*  PLT 365  --  345  --  338  --   --   HEPARINUNFRC  --    < > 0.17*   < > >1.10* 0.34 0.21*  CREATININE 0.70  --   --   --   --   --   --    < > = values in this interval not displayed.     Estimated Creatinine Clearance: 101.4 mL/min (by C-G formula based on SCr of 0.7 mg/dL).   Medical History: History reviewed. No pertinent past medical history.  Medications:  Scheduled:   Chlorhexidine Gluconate Cloth  6 each Topical Daily   feeding supplement  1 Container Oral BID BM   feeding supplement  237 mL Oral BID BM   insulin aspart  0-15 Units Subcutaneous TID WC   mouth rinse  15 mL Mouth Rinse BID   methocarbamol  1,000 mg Oral TID   multivitamin with minerals  1 tablet Oral Daily   nutrition supplement (JUVEN)  1 packet Oral BID BM   pantoprazole (PROTONIX) IV  40 mg Intravenous Q12H   sodium chloride flush  10-40 mL Intracatheter Q12H   Infusions:   sodium chloride Stopped (08/14/20 0427)   sodium chloride Stopped (08/17/20 1800)   heparin 2,050 Units/hr (08/22/20 1449)   piperacillin-tazobactam (ZOSYN)  IV 3.375 g (08/22/20 1814)    Assessment: EC fistula, 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 PICC line RUE, with new DVT  08/22/2020 7:12 PM  HL is 0.21, subtherapeutic  CBC: Hgb down to 5.6; 2 units to be transfused today.  Pltc WNL Pt transfused and Hgb is up to 8.1  RN reports no bleeding or  infusion related concerns  Goal of Therapy:  Heparin level 0.3-0.7 units/ml Monitor platelets by anticoagulation protocol: Yes   Plan:  Increase  heparin infusion to 2200 units/hr  Recheck HL in 6 hours Monitor daily heparin level, CBC, s/s bleeding   Royetta Asal, PharmD, BCPS 08/22/2020 7:12 PM

## 2020-08-22 NOTE — Progress Notes (Signed)
7 Days Post-Op   Subjective/Chief Complaint: No complaints. Hg down today. Plan for transfusion   Objective: Vital signs in last 24 hours: Temp:  [97.6 F (36.4 C)-99.1 F (37.3 C)] 98.1 F (36.7 C) (07/22 2300) Pulse Rate:  [84-109] 109 (07/23 0737) Resp:  [15-24] 24 (07/23 0737) BP: (118-148)/(61-83) 122/82 (07/23 0737) SpO2:  [91 %-100 %] 99 % (07/23 0737) Weight:  [79.8 kg] 79.8 kg (07/22 1336) Last BM Date: 08/21/20  Intake/Output from previous day: 07/22 0701 - 07/23 0700 In: 619.6 [I.V.:470.1; IV Piggyback:139.4] Out: [id number] [Urine:2745; Drains:30; [id number] Intake/Output this shift: Total I/O In: -  Out: 200 [Urine:200]  General appearance: alert and cooperative Resp: clear to auscultation bilaterally Cardio: regular rate and rhythm GI: soft, nontender. Wounds clean. Ostomy productive  Lab Results:  Recent Labs    08/21/20 0249 08/22/20 0415  WBC 20.3* 15.4*  HGB 7.7* 5.6*  HCT 23.6* 17.0*  PLT 345 338   BMET Recent Labs    08/19/20 1236 08/20/20 0755  NA SPECIMEN CONTAMINATED, UNABLE TO PERFORM TEST(S). 131*  K SPECIMEN CONTAMINATED, UNABLE TO PERFORM TEST(S). 4.0  CL SPECIMEN CONTAMINATED, UNABLE TO PERFORM TEST(S). 99  CO2 SPECIMEN CONTAMINATED, UNABLE TO PERFORM TEST(S). 23  GLUCOSE SPECIMEN CONTAMINATED, UNABLE TO PERFORM TEST(S). 116*  BUN SPECIMEN CONTAMINATED, UNABLE TO PERFORM TEST(S). 14  CREATININE SPECIMEN CONTAMINATED, UNABLE TO PERFORM TEST(S). 0.70  CALCIUM SPECIMEN CONTAMINATED, UNABLE TO PERFORM TEST(S). 8.2*   PT/INR No results for input(s): LABPROT, INR in the last 72 hours. ABG No results for input(s): PHART, HCO3 in the last 72 hours.  Invalid input(s): PCO2, PO2  Studies/Results: VAS [place] LOWER EXTREMITY VENOUS (DVT)  Result Date: 08/20/2020  Lower Venous DVT Study Patient Name:  Roger Brennan  Date of Exam:   08/20/2020 Medical Rec #: [id number]        Accession #:    [id number] Date of Birth: Dec 19, 1960         Patient  Gender: M Patient Age:   60Y Exam Location:  [facility] Procedure:      VAS [place] LOWER EXTREMITY VENOUS (DVT) Referring Phys: [id number] [place] --------------------------------------------------------------------------------  Indications: Edema.  Risk Factors: None identified. Comparison Study: No prior studies. Performing Technologist: Oliver Hum RVT  Examination Guidelines: A complete evaluation includes B-mode imaging, spectral Doppler, color Doppler, and power Doppler as needed of all accessible portions of each vessel. Bilateral testing is considered an integral part of a complete examination. Limited examinations for reoccurring indications may be performed as noted. The reflux portion of the exam is performed with the patient in reverse Trendelenburg.  +---------+---------------+---------+-----------+----------+--------------+ RIGHT    CompressibilityPhasicitySpontaneityPropertiesThrombus Aging +---------+---------------+---------+-----------+----------+--------------+ CFV      Full           Yes      Yes                                 +---------+---------------+---------+-----------+----------+--------------+ SFJ      Full                                                        +---------+---------------+---------+-----------+----------+--------------+ FV Prox  Full                                                        +---------+---------------+---------+-----------+----------+--------------+  FV Mid   Full                                                        +---------+---------------+---------+-----------+----------+--------------+ FV DistalFull                                                        +---------+---------------+---------+-----------+----------+--------------+ PFV      Full                                                        +---------+---------------+---------+-----------+----------+--------------+ POP      Full            Yes      Yes                                 +---------+---------------+---------+-----------+----------+--------------+ PTV      Full                                                        +---------+---------------+---------+-----------+----------+--------------+ PERO     Full                                                        +---------+---------------+---------+-----------+----------+--------------+ Gastroc  Partial                                      Acute          +---------+---------------+---------+-----------+----------+--------------+   +---------+---------------+---------+-----------+----------+--------------+ LEFT     CompressibilityPhasicitySpontaneityPropertiesThrombus Aging +---------+---------------+---------+-----------+----------+--------------+ CFV      Full           Yes      Yes                                 +---------+---------------+---------+-----------+----------+--------------+ SFJ      Full                                                        +---------+---------------+---------+-----------+----------+--------------+ FV Prox  Full                                                        +---------+---------------+---------+-----------+----------+--------------+  FV Mid   Full                                                        +---------+---------------+---------+-----------+----------+--------------+ FV DistalFull                                                        +---------+---------------+---------+-----------+----------+--------------+ PFV      Full                                                        +---------+---------------+---------+-----------+----------+--------------+ POP      Full           Yes      Yes                                 +---------+---------------+---------+-----------+----------+--------------+ PTV      Full                                                         +---------+---------------+---------+-----------+----------+--------------+ PERO     Full                                                        +---------+---------------+---------+-----------+----------+--------------+     Summary: RIGHT: - Findings consistent with acute deep vein thrombosis involving the right gastrocnemius veins. - No cystic structure found in the popliteal fossa.  LEFT: - There is no evidence of deep vein thrombosis in the lower extremity.  - No cystic structure found in the popliteal fossa.  *See table(s) above for measurements and observations. Electronically signed by Monica Martinez MD on 08/20/2020 at 2:28:06 PM.    Final    VAS [place] UPPER EXTREMITY VENOUS DUPLEX  Result Date: 08/20/2020 UPPER VENOUS STUDY  Patient Name:  Roger Brennan  Date of Exam:   08/20/2020 Medical Rec #: [id number]        Accession #:    [id number] Date of Birth: 1960-05-15         Patient Gender: M Patient Age:   60Y Exam [facility]:  [facility] Procedure:      VAS [place] UPPER EXTREMITY VENOUS DUPLEX Referring Phys: [id number] [place] --------------------------------------------------------------------------------  Indications: Edema Risk Factors: None identified. Limitations: Bandages and line. Comparison Study: No prior studies. Performing Technologist: Oliver Hum RVT  Examination Guidelines: A complete evaluation includes B-mode imaging, spectral Doppler, color Doppler, and power Doppler as needed of all accessible portions of each vessel. Bilateral testing is considered an integral part of a complete examination. Limited examinations for reoccurring indications may be performed as noted.  Right Findings: +----------+------------+---------+-----------+----------+-------+ RIGHT     CompressiblePhasicitySpontaneousPropertiesSummary +----------+------------+---------+-----------+----------+-------+ IJV           Full       Yes       Yes                       +----------+------------+---------+-----------+----------+-------+ Subclavian    Full       Yes       Yes                      +----------+------------+---------+-----------+----------+-------+ Axillary      None       No        No                Acute  +----------+------------+---------+-----------+----------+-------+ Brachial      Full       Yes       Yes                      +----------+------------+---------+-----------+----------+-------+ Radial        Full                                          +----------+------------+---------+-----------+----------+-------+ Ulnar         Full                                          +----------+------------+---------+-----------+----------+-------+ Cephalic      Full                                          +----------+------------+---------+-----------+----------+-------+ Basilic       None                                   Acute  +----------+------------+---------+-----------+----------+-------+ DVT found in the axillary vein noted to be in the distal segment and surrounds the patient's PICC line. SVT found in the basilic vein is surrounding the patient's PICC line.  Left Findings: +----------+------------+---------+-----------+----------+-------+ LEFT      CompressiblePhasicitySpontaneousPropertiesSummary +----------+------------+---------+-----------+----------+-------+ IJV           Full       Yes       Yes                      +----------+------------+---------+-----------+----------+-------+ Subclavian    Full       Yes       Yes                      +----------+------------+---------+-----------+----------+-------+ Axillary      Full       Yes       Yes                      +----------+------------+---------+-----------+----------+-------+ Brachial      Full       Yes       Yes                      +----------+------------+---------+-----------+----------+-------+ Radial  Full                                          +----------+------------+---------+-----------+----------+-------+ Ulnar         Full                                          +----------+------------+---------+-----------+----------+-------+ Cephalic      Full                                          +----------+------------+---------+-----------+----------+-------+ Basilic       None                                   Acute  +----------+------------+---------+-----------+----------+-------+ SVT found in the basilic vein is noted to be in the antecubital fossa.  *See table(s) above for measurements and observations.  Diagnosing physician: Monica Martinez MD Electronically signed by Monica Martinez MD on 08/20/2020 at 2:29:10 PM.    Final     Anti-infectives: Anti-infectives (From admission, onward)    Start     Dose/Rate Route Frequency Ordered Stop   08/15/20 1000  anidulafungin (ERAXIS) 100 mg in sodium chloride 0.9 % 100 mL IVPB        100 mg 78 mL/hr over 100 Minutes Intravenous Every 24 hours 08/14/20 1349 08/20/20 1326   08/14/20 1600  anidulafungin (ERAXIS) 200 mg in sodium chloride 0.9 % 200 mL IVPB        200 mg 78 mL/hr over 200 Minutes Intravenous  Once 08/14/20 1349 08/14/20 2000   08/14/20 1000  vancomycin (VANCOREADY) IVPB 1500 mg/300 mL  Status:  Discontinued        1,500 mg 150 mL/hr over 120 Minutes Intravenous Every 24 hours 08/13/20 1009 08/14/20 0901   08/13/20 1000  vancomycin (VANCOREADY) IVPB 1250 mg/250 mL  Status:  Discontinued        1,250 mg 166.7 mL/hr over 90 Minutes Intravenous Every 24 hours 08/12/20 1910 08/13/20 1009   08/12/20 2200  piperacillin-tazobactam (ZOSYN) IVPB 3.375 g  Status:  Discontinued        3.375 g 12.5 mL/hr over 240 Minutes Intravenous Every 8 hours 08/12/20 1904 08/12/20 1941   08/12/20 2000  clindamycin (CLEOCIN) IVPB 600 mg  Status:  Discontinued        600 mg 100 mL/hr over 30 Minutes Intravenous Every 8 hours  08/12/20 1757 08/17/20 0844   08/12/20 1830  piperacillin-tazobactam (ZOSYN) IVPB 3.375 g        3.375 g 12.5 mL/hr over 240 Minutes Intravenous Every 8 hours 08/12/20 1811     08/12/20 1015  vancomycin (VANCOREADY) IVPB 1500 mg/300 mL        1,500 mg 150 mL/hr over 120 Minutes Intravenous  Once 08/12/20 1007 08/12/20 1330   08/12/20 1000  ceFEPIme (MAXIPIME) 2 g in sodium chloride 0.9 % 100 mL IVPB        2 g 200 mL/hr over 30 Minutes Intravenous  Once 08/12/20 1000 08/12/20 1121   08/12/20 1000  metroNIDAZOLE (FLAGYL) IVPB 500 mg        500 mg 100  mL/hr over 60 Minutes Intravenous  Once 08/12/20 1000 08/12/20 1226   08/12/20 1000  vancomycin (VANCOCIN) IVPB 1000 mg/200 mL premix  Status:  Discontinued        1,000 mg 200 mL/hr over 60 Minutes Intravenous  Once 08/12/20 1000 08/12/20 1007       Assessment/Plan: s/p Procedure(s): REPAIR ABDOMINAL WALL/REEXPLORATION OF ABDOMINAL WALL, IRRIGATION AND DEBRIDEMENT (N/A) Advance diet POD 10 s/p drainage and debridement of LLQ abdominal wall 15 x 20 cm, partial colectomy with end colostomy, Splenic mobilization by Dr. Kieth Brightly on 08/12/20 for abdominal wall abscess, diverticulitis POD 8 s/p reopening of recent laparotomy, lysis of adhesions for 45 min. Small bowel resection with anastomosis, enterrorhaphy x 2, debridement of 200 cm^2 of necrotic fat and fascia of abdominal wall by Dr. Kieth Brightly on 08/14/20 for intestinal injury POD 7 s/p Debridement of abdominal wall with above dimensions with sharp dissection and Pulsavac of wounds by Dr. Brantley Stage on 7/16 for Open abdominal wall wounds left flank, right lower quadrant total area 738 cm ^2  - The area left lower quadrants was 20 cm x 30 cm and midline 20 cm x 6 cm and right lower quadrant 6 x 3 cm - Off TPN. On soft diet. Nutrition following. - Colostomy functioning - Cont wound care. BID WTD. Monitor LLQ wound where I bluntly opened false bottom 7/21. - WOCN following - Considering weaning  PCA - Cont abx. Comp 7d Eraxis, cont Zosyn for 14d total - Cont drain - Consider transfer out of the unit today if labs look okay - Mobilize. PT/OT rec CIR - Pulm toilet - Path 7/13 w/ diverticulitis - no evidence of malignancy    FEN - Soft, SLIV (net +10L since admission) VTE - SCDs, he does have a dvt. On heparin drip. Hg down today. Will transfuse but if hg drops again then may have to stop the heparin ID - Currently on Zosyn. Bcx negative.  Foley - Out, voiding Dispo - CIR following. Recheck labs. Dopplers. Consider moving out of the unit and weaning PCA later today.    Hyperkalemia - Last K 6.0. Question if inaccurate given large change from prior labs. Recheck now. If > 5.5 will order EKG, Calcium Gluconate, Lasix etc Hyponatremia - Last NA 122. Question if inaccurate given large change from prior labs. Recheck now Hyperglycemia - stable ABL anemia - last hgb 5.6. Dark/melanous like stool today. trasfuse. Consider stopping heparin if hg drops again Protein Calorie Malnutrition - Pre-alb < 5 -> 11.4. TPN off, on diet. Nutrition following for supplements.   LOS: 10 days    Autumn Messing III 08/22/2020

## 2020-08-23 LAB — CBC
HCT: 21.6 % — ABNORMAL LOW (ref 39.0–52.0)
Hemoglobin: 7.1 g/dL — ABNORMAL LOW (ref 13.0–17.0)
MCH: 31.6 pg (ref 26.0–34.0)
MCHC: 32.9 g/dL (ref 30.0–36.0)
MCV: 96 fL (ref 80.0–100.0)
Platelets: 404 10*3/uL — ABNORMAL HIGH (ref 150–400)
RBC: 2.25 MIL/uL — ABNORMAL LOW (ref 4.22–5.81)
RDW: 17.6 % — ABNORMAL HIGH (ref 11.5–15.5)
WBC: 20.4 10*3/uL — ABNORMAL HIGH (ref 4.0–10.5)
nRBC: 0 % (ref 0.0–0.2)

## 2020-08-23 LAB — GLUCOSE, CAPILLARY
Glucose-Capillary: 159 mg/dL — ABNORMAL HIGH (ref 70–99)
Glucose-Capillary: 188 mg/dL — ABNORMAL HIGH (ref 70–99)
Glucose-Capillary: 206 mg/dL — ABNORMAL HIGH (ref 70–99)
Glucose-Capillary: 225 mg/dL — ABNORMAL HIGH (ref 70–99)

## 2020-08-23 LAB — HEPARIN LEVEL (UNFRACTIONATED): Heparin Unfractionated: 0.28 IU/mL — ABNORMAL LOW (ref 0.30–0.70)

## 2020-08-23 MED ORDER — ENOXAPARIN SODIUM 80 MG/0.8ML IJ SOSY
80.0000 mg | PREFILLED_SYRINGE | Freq: Two times a day (BID) | INTRAMUSCULAR | Status: DC
  Administered 2020-08-23 – 2020-08-24 (×4): 80 mg via SUBCUTANEOUS
  Filled 2020-08-23 (×4): qty 0.8

## 2020-08-23 MED ORDER — DICLOFENAC SODIUM 1 % EX GEL
2.0000 g | Freq: Four times a day (QID) | CUTANEOUS | Status: DC | PRN
  Administered 2020-08-29: 2 g via TOPICAL
  Filled 2020-08-23 (×2): qty 100

## 2020-08-23 NOTE — Progress Notes (Signed)
Temperature regulated after intervention of Tylenol given

## 2020-08-23 NOTE — Progress Notes (Signed)
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  Exam: Awake and alert Abdomen soft, wound clean, ostomy working well  Lab Results:  Recent Labs    08/21/20 0249 08/22/20 0415 08/22/20 1833  WBC 20.3* 15.4*  --   HGB 7.7* 5.6* 8.1*  HCT 23.6* 17.0* 24.5*  PLT 345 338  --    BMET Recent Labs    08/20/20 0755  NA 131*  K 4.0  CL 99  CO2 23  GLUCOSE 116*  BUN 14  CREATININE 0.70  CALCIUM 8.2*   PT/INR No results for input(s): LABPROT, INR in the last 72 hours. ABG No results for input(s): PHART, HCO3 in the last 72 hours.  Invalid input(s): PCO2, PO2  Studies/Results: No results found.  Anti-infectives: Anti-infectives (From admission, onward)    Start     Dose/Rate Route Frequency Ordered Stop   08/15/20 1000  anidulafungin (ERAXIS) 100 mg in sodium chloride 0.9 % 100 mL IVPB        100 mg 78 mL/hr over 100 Minutes Intravenous Every 24 hours 08/14/20 1349 08/20/20 1326   08/14/20 1600  anidulafungin (ERAXIS) 200 mg in sodium chloride 0.9 % 200 mL IVPB        200 mg 78 mL/hr over 200 Minutes Intravenous  Once 08/14/20 1349 08/14/20 2000   08/14/20 1000  vancomycin (VANCOREADY) IVPB 1500 mg/300 mL  Status:  Discontinued        1,500 mg 150 mL/hr over 120 Minutes Intravenous Every 24 hours 08/13/20 1009 08/14/20 0901   08/13/20 1000   vancomycin (VANCOREADY) IVPB 1250 mg/250 mL  Status:  Discontinued        1,250 mg 166.7 mL/hr over 90 Minutes Intravenous Every 24 hours 08/12/20 1910 08/13/20 1009   08/12/20 2200  piperacillin-tazobactam (ZOSYN) IVPB 3.375 g  Status:  Discontinued        3.375 g 12.5 mL/hr over 240 Minutes Intravenous Every 8 hours 08/12/20 1904 08/12/20 1941   08/12/20 2000  clindamycin (CLEOCIN) IVPB 600 mg  Status:  Discontinued        600 mg 100 mL/hr over 30 Minutes Intravenous Every 8 hours 08/12/20 1757 08/17/20 0844   08/12/20 1830  piperacillin-tazobactam (ZOSYN) IVPB 3.375 g        3.375 g 12.5 mL/hr over 240 Minutes Intravenous Every 8 hours 08/12/20 1811     08/12/20 1015  vancomycin (VANCOREADY) IVPB 1500 mg/300 mL        1,500 mg 150 mL/hr over 120 Minutes Intravenous  Once 08/12/20 1007 08/12/20 1330   08/12/20 1000  ceFEPIme (MAXIPIME) 2 g in sodium chloride 0.9 % 100 mL IVPB        2 g 200 mL/hr over 30 Minutes Intravenous  Once 08/12/20 1000 08/12/20 1121   08/12/20 1000  metroNIDAZOLE (FLAGYL) IVPB 500 mg        500 mg 100 mL/hr over 60 Minutes Intravenous  Once 08/12/20  1000 08/12/20 1226   08/12/20 1000  vancomycin (VANCOCIN) IVPB 1000 mg/200 mL premix  Status:  Discontinued        1,000 mg 200 mL/hr over 60 Minutes Intravenous  Once 08/12/20 1000 08/12/20 1007       Assessment/Plan: s/p drainage and debridement of LLQ abdominal wall 15 x 20 cm, partial colectomy with end colostomy, Splenic mobilization by Dr. Kieth Brightly on 08/12/20 for abdominal wall abscess, diverticulitis s/p reopening of recent laparotomy, lysis of adhesions for 45 min. Small bowel resection with anastomosis, enterrorhaphy x 2, debridement of 200 cm^2 of necrotic fat and fascia of abdominal wall by Dr. Kieth Brightly on 08/14/20 for intestinal injury s/p Debridement of abdominal wall with above dimensions with sharp dissection and Pulsavac of wounds by Dr. Brantley Stage on 7/16 for Open abdominal wall wounds left flank,  right lower quadrant total area 738 cm ^2  Hgb stable last evening.  Labs this morning pending Continue heparin for now Continue wound care, current care Add voltarin gel if on formulary to elbow Disp planning   Coralie Keens MD 08/23/2020

## 2020-08-23 NOTE — Progress Notes (Signed)
OT Cancellation Note  Patient Details Name: Roger Brennan MRN: [id number] DOB: 02/25/1960   Cancelled Treatment:    Reason Eval/Treat Not Completed: Medical issues which prohibited therapy. Note patient heparin level still subtherapeutic and hemoglobin drop from 8.1 to 7.1 today. Will check back 7/25 as schedule permits.  Delbert Phenix OT OT pager: [place] 08/23/2020, 1:59 PM

## 2020-08-23 NOTE — Progress Notes (Signed)
   08/23/20 2129  Treat  MEWS Interventions Administered prn meds/treatments  Pain Scale 0-10  Pain Score 0  Pain Type Acute pain;Surgical pain  Pain Location Abdomen  Pain Orientation Mid;Lower  Pain Descriptors / Indicators Aching  Pain Frequency Intermittent  Pain Onset On-going  Patients Stated Pain Goal 2  Pain Intervention(s) Medication (See eMAR)  Neuro symptoms relieved by Rest  Patients response to intervention Effective  Escalate  MEWS: Escalate Yellow: discuss with charge nurse/RN and consider discussing with provider and RRT  Notify: Charge Nurse/RN  Name of Charge Nurse/RN Notified M[place], RN  Date Charge Nurse/RN Notified 08/23/20  Time Charge Nurse/RN Notified 2131  Yellow mews protocol implemented. Charge nurse notified. Tylenol given for temp. Will recheck temperature in 30 minutes. If not relieved, will notify provider.

## 2020-08-24 ENCOUNTER — Inpatient Hospital Stay (HOSPITAL_COMMUNITY): Payer: Managed Care, Other (non HMO)

## 2020-08-24 DIAGNOSIS — L8990 Pressure ulcer of unspecified site, unspecified stage: Secondary | ICD-10-CM | POA: Insufficient documentation

## 2020-08-24 LAB — CBC
HCT: 17.5 % — ABNORMAL LOW (ref 39.0–52.0)
HCT: 21.4 % — ABNORMAL LOW (ref 39.0–52.0)
Hemoglobin: 5.7 g/dL — CL (ref 13.0–17.0)
Hemoglobin: 7.1 g/dL — ABNORMAL LOW (ref 13.0–17.0)
MCH: 31 pg (ref 26.0–34.0)
MCH: 31.1 pg (ref 26.0–34.0)
MCHC: 32.6 g/dL (ref 30.0–36.0)
MCHC: 33.2 g/dL (ref 30.0–36.0)
MCV: 95.1 fL (ref 80.0–100.0)
MCV: 93.9 fL (ref 80.0–100.0)
Platelets: 398 10*3/uL (ref 150–400)
Platelets: 450 10*3/uL — ABNORMAL HIGH (ref 150–400)
RBC: 1.84 MIL/uL — ABNORMAL LOW (ref 4.22–5.81)
RBC: 2.28 MIL/uL — ABNORMAL LOW (ref 4.22–5.81)
RDW: 18.3 % — ABNORMAL HIGH (ref 11.5–15.5)
RDW: 17.2 % — ABNORMAL HIGH (ref 11.5–15.5)
WBC: 16.7 10*3/uL — ABNORMAL HIGH (ref 4.0–10.5)
WBC: 19.1 10*3/uL — ABNORMAL HIGH (ref 4.0–10.5)
nRBC: 0 % (ref 0.0–0.2)
nRBC: 0 % (ref 0.0–0.2)

## 2020-08-24 LAB — GLUCOSE, CAPILLARY
Glucose-Capillary: 151 mg/dL — ABNORMAL HIGH (ref 70–99)
Glucose-Capillary: 154 mg/dL — ABNORMAL HIGH (ref 70–99)
Glucose-Capillary: 266 mg/dL — ABNORMAL HIGH (ref 70–99)
Glucose-Capillary: 312 mg/dL — ABNORMAL HIGH (ref 70–99)
Glucose-Capillary: 208 mg/dL — ABNORMAL HIGH (ref 70–99)

## 2020-08-24 LAB — BASIC METABOLIC PANEL WITH GFR
Anion gap: 7 (ref 5–15)
BUN: 17 mg/dL (ref 6–20)
CO2: 23 mmol/L (ref 22–32)
Calcium: 7.5 mg/dL — ABNORMAL LOW (ref 8.9–10.3)
Chloride: 94 mmol/L — ABNORMAL LOW (ref 98–111)
Creatinine, Ser: 0.69 mg/dL (ref 0.61–1.24)
GFR, Estimated: >60 mL/min
Glucose, Bld: 186 mg/dL — ABNORMAL HIGH (ref 70–99)
Potassium: 3.6 mmol/L (ref 3.5–5.1)
Sodium: 124 mmol/L — ABNORMAL LOW (ref 135–145)

## 2020-08-24 LAB — SEDIMENTATION RATE: Sed Rate: 140 mm/h — ABNORMAL HIGH (ref 0–16)

## 2020-08-24 LAB — PREPARE RBC (CROSSMATCH)

## 2020-08-24 LAB — C-REACTIVE PROTEIN: CRP: 25.7 mg/dL — ABNORMAL HIGH

## 2020-08-24 MED ORDER — SODIUM CHLORIDE 0.9% IV SOLUTION: Freq: Once | INTRAVENOUS | Status: AC

## 2020-08-24 MED ORDER — POLYETHYLENE GLYCOL 3350 17 G PO PACK: 17.0000 g | PACK | Freq: Every day | ORAL | Status: DC | PRN

## 2020-08-24 NOTE — Progress Notes (Signed)
Notified by Lab that hgb 5.7, needed redraw, hgb was 7.1. lab redrawn, RN day shift notified.

## 2020-08-24 NOTE — Progress Notes (Signed)
  PT Cancellation Note  Patient Details Name: Roger Brennan MRN: [id number] DOB: April 24, 1960   Cancelled Treatment:     new fevers and low HgB Will need to hold of any Physical Therapy fro today but continue to monitor   Nathanial Rancher 08/24/2020, 2:58 PM

## 2020-08-24 NOTE — Consult Note (Signed)
[place] Nurse ostomy follow up Stoma type/location: LUQ, colostomy  Stomal assessment/size: 1 5/8" round, budded, moist, pink Peristomal assessment: NA Treatment options for stomal/peristomal skin: not able to assess today Output dark black stool (CCS aware) Ostomy pouching: 2pc. 2 3/4" unclear about barrier ring use; however patient reports he told the night shift nurse who changed when it overfilled and leaked everywhere  Education provided:  Discussed comfort level with 6th floor staff; explained bedside nurses level of comfort and training is very different than on a surgical floor; patient and his SO feeling a little better now about last evenings events.  We have arranged to do another pouch change in the am with the patient and SO to learn care.  Plans for DC to CIR; [place] nurses will continue to follow there  Enrolled patient in [place] Start Discharge program: Yes  [place] Nurse will follow along with you for continued support with ostomy teaching and care [place] MSN, [place], [place], [place], [place]

## 2020-08-24 NOTE — Progress Notes (Signed)
9 Days Post-Op  Subjective: CC: Fever of 101.1 noted overnight.  He currently denies any abdominal pain except for with dressing changes.  He did not get out of bed yesterday or today.  He reports he is tolerating soft diet without nausea or vomiting.  Drain serosanguineous.  Colostomy functioning and melanotic.  Had some leaking from this yesterday. He denies any chest pain, shortness of breath, cough or hemoptysis. No urinary symptoms. He complains of pain in his left elbow, right elbow and right knee with the inability to range them without significant pain.  Reports only prior orthopedic surgery was a meniscus repair of the left knee.  No trauma/fall. Reports hx of gout to big toe once before. Never to these areas.   Objective: Vital signs in last 24 hours: Temp:  [98 F (36.7 C)-101.1 F (38.4 C)] 99.6 F (37.6 C) (07/25 0755) Pulse Rate:  [94-114] 94 (07/25 0755) Resp:  [16-18] 17 (07/25 0755) BP: (114-134)/(61-72) 114/61 (07/25 0755) SpO2:  [98 %-99 %] 98 % (07/25 0755) Last BM Date: 08/23/20  Intake/Output from previous day: 07/24 0701 - 07/25 0700 In: 250 [P.O.:240; I.V.:10] Out: 1650 [Urine:1450; Stool:200] Intake/Output this shift: Total I/O In: 240 [P.O.:240] Out: 300 [Urine:300]  PE: Gen:  Alert, NAD, pleasant HEENT: EOM's intact, pupils equal and round Card: RRR Pulm:  CTAB, no W/R/R, effort normal. On RA Abd: Soft, ND, appropriately tender around wounds, +BS, colostomy bag with black stool. Unable to visualize colostomy 2/2 stool. Drain  SS. Wounds as noted below. LLQ wound with healthy granulation tissue at the base. Inferior/lateral to the wound there was some induration on the skin as noted in the picture - a false bottom/wall had formed at the inferior portion of the wound and I was able to open this extending the wound ~5cm inferior and drain some SS fluid. Midline wound as noted in picture #1. Mixed granulation and fatty tissue without dehiscence or  evisceration. The inferior aspect of the midline wound tunnels and connects to the LLQ wound. RLQ wound with healthy grannulation tissue at the base. GU: No foley Ext: Left elbow with swelling over joint and bursa. Tenderness over the left elbow. Some erythema and heat over the bursa. Patient reports significant pain with minimal rom of the elbow. Able rom of the left wrist and shoulder.   Right elbow with swelling and tenderness. No erythema or heat. Patient reports significant pain with minimal rom of the elbow. Able rom of the left wrist and shoulder. Radial pulses 2+. Right knee with effusion noted. No erythema or heat. No real TTP of the right knee. Patient reports significant pain with minimal rom of the right knee. Able rom of the hip and ankle on the right. Able rom of the LLE of all joints without pain. DP 2+ Psych: A&Ox3 Skin: no rashes noted, warm and dry  Midline wound  LLQ wound    RLQ   R knee swelling  L elbow  L elbow   Lab Results:  Recent Labs    08/23/20 0749 08/24/20 0754  WBC 20.4* 16.7*  HGB 7.1* 5.7*  HCT 21.6* 17.5*  PLT 404* 398   BMET No results for input(s): NA, K, CL, CO2, GLUCOSE, BUN, CREATININE, CALCIUM in the last 72 hours. PT/INR No results for input(s): LABPROT, INR in the last 72 hours. CMP     Component Value Date/Time   NA 131 (L) 08/20/2020 0755   K 4.0 08/20/2020 0755   CL 99  08/20/2020 0755   CO2 23 08/20/2020 0755   GLUCOSE 116 (H) 08/20/2020 0755   BUN 14 08/20/2020 0755   CREATININE 0.70 08/20/2020 0755   CALCIUM 8.2 (L) 08/20/2020 0755   PROT SPECIMEN CONTAMINATED, UNABLE TO PERFORM TEST(S). 08/19/2020 1236   ALBUMIN SPECIMEN CONTAMINATED, UNABLE TO PERFORM TEST(S). 08/19/2020 1236   AST SPECIMEN CONTAMINATED, UNABLE TO PERFORM TEST(S). 08/19/2020 1236   ALT SPECIMEN CONTAMINATED, UNABLE TO PERFORM TEST(S). 08/19/2020 1236   ALKPHOS SPECIMEN CONTAMINATED, UNABLE TO PERFORM TEST(S). 08/19/2020 1236   BILITOT SPECIMEN  CONTAMINATED, UNABLE TO PERFORM TEST(S). 08/19/2020 1236   GFRNONAA >60 08/20/2020 0755   GFRAA SPECIMEN CONTAMINATED, UNABLE TO PERFORM TEST(S). 08/19/2020 1236   Lipase     Component Value Date/Time   LIPASE 18 08/12/2020 1010    Studies/Results: No results found.  Anti-infectives: Anti-infectives (From admission, onward)    Start     Dose/Rate Route Frequency Ordered Stop   08/15/20 1000  anidulafungin (ERAXIS) 100 mg in sodium chloride 0.9 % 100 mL IVPB        100 mg 78 mL/hr over 100 Minutes Intravenous Every 24 hours 08/14/20 1349 08/20/20 1326   08/14/20 1600  anidulafungin (ERAXIS) 200 mg in sodium chloride 0.9 % 200 mL IVPB        200 mg 78 mL/hr over 200 Minutes Intravenous  Once 08/14/20 1349 08/14/20 2000   08/14/20 1000  vancomycin (VANCOREADY) IVPB 1500 mg/300 mL  Status:  Discontinued        1,500 mg 150 mL/hr over 120 Minutes Intravenous Every 24 hours 08/13/20 1009 08/14/20 0901   08/13/20 1000  vancomycin (VANCOREADY) IVPB 1250 mg/250 mL  Status:  Discontinued        1,250 mg 166.7 mL/hr over 90 Minutes Intravenous Every 24 hours 08/12/20 1910 08/13/20 1009   08/12/20 2200  piperacillin-tazobactam (ZOSYN) IVPB 3.375 g  Status:  Discontinued        3.375 g 12.5 mL/hr over 240 Minutes Intravenous Every 8 hours 08/12/20 1904 08/12/20 1941   08/12/20 2000  clindamycin (CLEOCIN) IVPB 600 mg  Status:  Discontinued        600 mg 100 mL/hr over 30 Minutes Intravenous Every 8 hours 08/12/20 1757 08/17/20 0844   08/12/20 1830  piperacillin-tazobactam (ZOSYN) IVPB 3.375 g        3.375 g 12.5 mL/hr over 240 Minutes Intravenous Every 8 hours 08/12/20 1811 08/26/20 1829   08/12/20 1015  vancomycin (VANCOREADY) IVPB 1500 mg/300 mL        1,500 mg 150 mL/hr over 120 Minutes Intravenous  Once 08/12/20 1007 08/12/20 1330   08/12/20 1000  ceFEPIme (MAXIPIME) 2 g in sodium chloride 0.9 % 100 mL IVPB        2 g 200 mL/hr over 30 Minutes Intravenous  Once 08/12/20 1000 08/12/20  1121   08/12/20 1000  metroNIDAZOLE (FLAGYL) IVPB 500 mg        500 mg 100 mL/hr over 60 Minutes Intravenous  Once 08/12/20 1000 08/12/20 1226   08/12/20 1000  vancomycin (VANCOCIN) IVPB 1000 mg/200 mL premix  Status:  Discontinued        1,000 mg 200 mL/hr over 60 Minutes Intravenous  Once 08/12/20 1000 08/12/20 1007        Assessment/Plan POD 12 s/p drainage and debridement of LLQ abdominal wall 15 x 20 cm, partial colectomy with end colostomy, Splenic mobilization by Dr. Kieth Brightly on 08/12/20 for abdominal wall abscess, diverticulitis POD 10 s/p reopening of recent laparotomy,  lysis of adhesions for 45 min. Small bowel resection with anastomosis, enterrorhaphy x 2, debridement of 200 cm^2 of necrotic fat and fascia of abdominal wall by Dr. Kieth Brightly on 08/14/20 for intestinal injury POD 9 s/p Debridement of abdominal wall with above dimensions with sharp dissection and Pulsavac of wounds by Dr. Brantley Stage on 7/16 for Open abdominal wall wounds left flank, right lower quadrant total area 738 cm ^2  - The area left lower quadrants was 20 cm x 30 cm and midline 20 cm x 6 cm and right lower quadrant 6 x 3 cm - Path 7/13 w/ diverticulitis - no evidence of malignancy  - On soft diet. Nutrition following. - Colostomy functioning - Cont wound care. BID WTD. Monitor LLQ wound where I had to bluntly opened false bottom 7/21 and 7/25. Make sure this area is packed. - WOCN following. Had some leakage this am - I asked them to assess this - Cont abx. Comp 7d Eraxis, on Zosyn for 14d total - Cont drain. SS - Mobilize. PT/OT rec CIR - Pulm toilet - Fever overnight. WBC down at 16.7. Abd soft without really any tenderness. Wounds clean. Drain SS. Hold off on CT A/P right now. Will get CXR and UA to start. Also having new swelling of right elbow, left elbow and right knee - asking ortho to see.    FEN - Soft diet. Recheck BMP VTE - SCDs, Lovenox (therapeutic)  ID - Currently on Zosyn. Bcx negative.   Foley - Out, voiding   RUE/RLE DVT - Lovenox Hyperglycemia - stable. SSI ABL anemia - Hgb 5.7 this Am. Ordered 2U PRBC. Dark/melanous like stool today. On Lovenox BID for DVT's. May need to hold if hgb does not respond appropriately or drops again.  Protein Calorie Malnutrition - Pre-alb < 5 -> 11.4. TPN off, on diet. Nutrition following for supplements.  L elbow pain - Patient with reported pain to the left elbow. He has noted swelling of the joint and bursa. There is question of some erythema and warmth. He will not let me range this and reports pain with attempts of minimal rom. He has a hx of gout in the past (1x, big toe). No trauma reported. No prior surgery. Will get plain film to start, ESR/CRP during next blood draw.  Spoke with Caryl Pina of [facility], Dr. Alvan Dame to see.  R elbow pain - Pain and swelling noted. Pain with minimal rom. No trauma reported. No prior surgery. Will get plain film to start, ESR/CRP during next blood draw. Spoke with Caryl Pina of [facility], Dr. Alvan Dame to see.  R knee pain - Pain and swelling noted. Pain with minimal rom. No trauma reported. No prior surgery. Will get plain film to start, ESR/CRP during next blood draw.  Spoke with Caryl Pina of [facility], Dr. Alvan Dame to see.    LOS: 12 days    Jillyn Ledger , [facility] Surgery 08/24/2020, 11:30 AM Please see Amion for pager number during day hours 7:00am-4:30pm

## 2020-08-24 NOTE — Progress Notes (Signed)
Inpatient Rehab Admissions Coordinator:   Pt. With new fever, hgb of 5.7 this am. He is not medically ready for CIR at this time but I will follow for potential admit pending medical readiness, insurance auth, and bed availability.   Clemens Catholic, [place], [place] Admissions Coordinator  [phone] ([place]) [phone] (office)

## 2020-08-24 NOTE — Progress Notes (Signed)
Pharmacy Antibiotic Note  Roger Brennan is a 60 y.o. 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  Pharmacy has been consulted for Zosyn dosing.  7 days of Eraxis completed 7/21.  Plan:  Continue Zosyn 3.375g IV Q8H infused over 4hrs for total 14 days Stop date for Zosyn added  Height: '5\' 10"'$  (177.8 cm) Weight: 79.8 kg (175 lb 14.8 oz) IBW/kg (Calculated) : 73  Temp (24hrs), Avg:99.7 F (37.6 C), Min:98 F (36.7 C), Max:101.1 F (38.4 C)  Recent Labs  Lab 08/18/20 0417 08/18/20 0833 08/19/20 0500 08/19/20 1236 08/20/20 0755 08/21/20 0249 08/22/20 0415 08/23/20 0749 08/24/20 0754  WBC  --    < >  --   --  20.7* 20.3* 15.4* 20.4* 16.7*  CREATININE 0.56*  --  0.57*  0.55* SPECIMEN CONTAMINATED, UNABLE TO PERFORM TEST(S). 0.70  --   --   --   --    < > = values in this interval not displayed.     Estimated Creatinine Clearance: 101.4 mL/min (by C-G formula based on SCr of 0.7 mg/dL).    No Known Allergies  Antimicrobials this admission: 7/13 Vanc 1500 >> 7/15 7/13 cefepime 2 gm x 1 7/13 flagyl x 1 7/13 Clindamycin >>7/18 7/13 Zosyn >> (7/27) 7/15 Eraxis >> 7/21   Dose adjustments this admission: 7/14 increase vanc from '1250mg'$  to '1500mg'$  q24h (SCr 1.1, est AUC 429)   Microbiology results: 7/13 UCx: 5k E.coli 7/13 BCx2: ngF 7/15 tissue/abd: few B fragilis, B lactamase positive - Final  Thank you for allowing pharmacy to be a part of this patient's care.  Dimple Nanas, PharmD 08/24/2020 10:47 AM

## 2020-08-24 NOTE — Progress Notes (Signed)
OT Cancellation Note  Patient Details Name: Roger Brennan MRN: [id number] DOB: 1960/07/21   Cancelled Treatment:    Reason Eval/Treat Not Completed: Medical issues which prohibited therapy: Pt with Hgb of 5.7.  Will hold occupational therapy per department protocol, and continue efforts.   Julien Girt 08/24/2020, 11:28 AM

## 2020-08-24 NOTE — Progress Notes (Signed)
   08/24/20 1530  Assess: MEWS Score  Temp 98.7 F (37.1 C)  BP 127/65  Pulse Rate (!) 118  ECG Heart Rate (!) 118  Resp 16  SpO2 98 %  O2 Device Room Air  Assess: MEWS Score  MEWS Temp 0  MEWS Systolic 0  MEWS Pulse 2  MEWS RR 0  MEWS LOC 0  MEWS Score 2  MEWS Score Color Yellow  Assess: if the MEWS score is Yellow or Red  Were vital signs taken at a resting state? Yes  Focused Assessment No change from prior assessment  Does the patient meet 2 or more of the SIRS criteria? Yes  MEWS guidelines implemented *See Row Information* Yes  Treat  Pain Scale 0-10  Pain Score 4  Take Vital Signs  Increase Vital Sign Frequency  Yellow: Q 2hr X 2 then Q 4hr X 2, if remains yellow, continue Q 4hrs  Escalate  MEWS: Escalate Yellow: discuss with charge nurse/RN and consider discussing with provider and RRT  Notify: Charge Nurse/RN  Name of Charge Nurse/RN Notified [place] ,RN  Date Charge Nurse/RN Notified 08/24/20  Time Charge Nurse/RN Notified 81  Notify: Provider  Provider Name/Title Earnie Larsson, PA  Date Provider Notified 08/24/20  Time Provider Notified 1540  Notification Type Page  Notification Reason Critical result  Provider response No new orders  Date of Provider Response 08/24/20  Assess: SIRS CRITERIA  SIRS Temperature  0  SIRS Pulse 1  SIRS Respirations  0  SIRS WBC 1  SIRS Score Sum  2  Rechecked vitals signs,pt HR was 100 ,MEWS turned green

## 2020-08-25 ENCOUNTER — Other Ambulatory Visit: Payer: Self-pay

## 2020-08-25 DIAGNOSIS — E871 Hypo-osmolality and hyponatremia: Secondary | ICD-10-CM

## 2020-08-25 LAB — SYNOVIAL CELL COUNT + DIFF, W/ CRYSTALS
Crystals, Fluid: NONE SEEN
Eosinophils-Synovial: 0 % (ref 0–1)
Lymphocytes-Synovial Fld: 0 % (ref 0–20)
Monocyte-Macrophage-Synovial Fluid: 15 % — ABNORMAL LOW (ref 50–90)
Neutrophil, Synovial: 85 % — ABNORMAL HIGH (ref 0–25)
WBC, Synovial: 6100 /mm3 — ABNORMAL HIGH (ref 0–200)

## 2020-08-25 LAB — CBC
HCT: 19.5 % — ABNORMAL LOW (ref 39.0–52.0)
Hemoglobin: 6.4 g/dL — CL (ref 13.0–17.0)
MCH: 30.9 pg (ref 26.0–34.0)
MCHC: 32.8 g/dL (ref 30.0–36.0)
MCV: 94.2 fL (ref 80.0–100.0)
Platelets: 472 10*3/uL — ABNORMAL HIGH (ref 150–400)
RBC: 2.07 MIL/uL — ABNORMAL LOW (ref 4.22–5.81)
RDW: 17.2 % — ABNORMAL HIGH (ref 11.5–15.5)
WBC: 19.1 10*3/uL — ABNORMAL HIGH (ref 4.0–10.5)
nRBC: 0 % (ref 0.0–0.2)

## 2020-08-25 LAB — BASIC METABOLIC PANEL
Anion gap: 7 (ref 5–15)
BUN: 14 mg/dL (ref 6–20)
CO2: 24 mmol/L (ref 22–32)
Calcium: 7.9 mg/dL — ABNORMAL LOW (ref 8.9–10.3)
Chloride: 97 mmol/L — ABNORMAL LOW (ref 98–111)
Creatinine, Ser: 0.68 mg/dL (ref 0.61–1.24)
GFR, Estimated: >60 mL/min (ref >60)
Glucose, Bld: 190 mg/dL — ABNORMAL HIGH (ref 70–99)
Potassium: 3.8 mmol/L (ref 3.5–5.1)
Sodium: 128 mmol/L — ABNORMAL LOW (ref 135–145)

## 2020-08-25 LAB — SODIUM, URINE, RANDOM: Sodium, Ur: 49 mmol/L

## 2020-08-25 LAB — GLUCOSE, CAPILLARY
Glucose-Capillary: 159 mg/dL — ABNORMAL HIGH (ref 70–99)
Glucose-Capillary: 162 mg/dL — ABNORMAL HIGH (ref 70–99)
Glucose-Capillary: 164 mg/dL — ABNORMAL HIGH (ref 70–99)
Glucose-Capillary: 187 mg/dL — ABNORMAL HIGH (ref 70–99)
Glucose-Capillary: 220 mg/dL — ABNORMAL HIGH (ref 70–99)
Glucose-Capillary: 267 mg/dL — ABNORMAL HIGH (ref 70–99)

## 2020-08-25 LAB — URINALYSIS, COMPLETE (UACMP) WITH MICROSCOPIC
Bilirubin Urine: NEGATIVE
Glucose, UA: NEGATIVE mg/dL
Hgb urine dipstick: NEGATIVE
Ketones, ur: NEGATIVE mg/dL
Leukocytes,Ua: NEGATIVE
Nitrite: NEGATIVE
Protein, ur: 30 mg/dL — AB
Specific Gravity, Urine: 1.019 (ref 1.005–1.030)
pH: 5 (ref 5.0–8.0)

## 2020-08-25 LAB — PREPARE RBC (CROSSMATCH)

## 2020-08-25 LAB — OSMOLALITY, URINE: Osmolality, Ur: 612 mOsm/kg (ref 300–900)

## 2020-08-25 MED ORDER — KETOROLAC TROMETHAMINE 15 MG/ML IJ SOLN
15.0000 mg | Freq: Four times a day (QID) | INTRAMUSCULAR | Status: AC
  Administered 2020-08-25 – 2020-08-26 (×4): 15 mg via INTRAVENOUS
  Filled 2020-08-25 (×4): qty 1

## 2020-08-25 MED ORDER — SODIUM CHLORIDE 0.9% IV SOLUTION: Freq: Once | INTRAVENOUS | Status: AC

## 2020-08-25 MED ORDER — LIDOCAINE HCL 2 % IJ SOLN
10.0000 mL | Freq: Once | INTRAMUSCULAR | Status: AC
  Administered 2020-08-25: 200 mg
  Filled 2020-08-25: qty 20

## 2020-08-25 MED ORDER — SODIUM CHLORIDE 0.9% IV SOLUTION: Freq: Once | INTRAVENOUS | Status: DC

## 2020-08-25 MED ORDER — SODIUM CHLORIDE 0.9 % IV SOLN: Freq: Once | INTRAVENOUS | Status: AC

## 2020-08-25 MED ORDER — POVIDONE-IODINE 10 % EX SOLN
Freq: Once | CUTANEOUS | Status: AC
  Filled 2020-08-25 (×2): qty 118

## 2020-08-25 NOTE — Consult Note (Signed)
[place] Nurse ostomy follow up Stoma type/location: LUQ, colostomy Stomal assessment/size: 1 3/8" slightly oval shaped, pink, moist Peristomal assessment: some dipping of abdominal skin at 3 and 9 o'clock, worse at 9 o'clock   Treatment options for stomal/peristomal skin: 2-2" skin barrier rings around the stoma to create flat pouching surface  Output dark, black gelatinous stool  Ostomy pouching: 2pc. 2 3/4" with 2" skin barrier ring Education provided:  Met with patient's SO Neoma Laming today; she has had other teaching visits Patient and Neoma Laming are able to verbalize steps in pouch change Demonstrated pouch change (cutting new skin barrier, cleaning peristomal skin and stoma, use of barrier ring) Discussed again the use of wick to clean spout  Discussed dry shave to prevent folliculitis; will plan to shave patient and demo this with next pouch change  Patient is in severe pain in both elbows and his right knee; he has taken pain meds during my visit.  He is pleasant but tired of current medical condition.  Does not really engage as much in care.    Enrolled patient in [place] Start Discharge program: Yes  [place] Nurse will follow along with you for continued support with ostomy teaching and care [place] MSN, [place], [place], [place], [place]

## 2020-08-25 NOTE — Plan of Care (Signed)
Patient premedicated for wound care, but by the time this writer got into the room, it had been 45 minutes and patient asked to defer until 5am, because he was afraid of pain. Ostomy bag relieved of gas twice, small amount of watery stool noted. Problem: Education: Goal: Knowledge of General Education information will improve Description: Including pain rating scale, medication(s)/side effects and non-pharmacologic comfort measures Outcome: Progressing   Problem: Health Behavior/Discharge Planning: Goal: Ability to manage health-related needs will improve Outcome: Progressing   Problem: Clinical Measurements: Goal: Ability to maintain clinical measurements within normal limits will improve Outcome: Progressing Goal: Will remain free from infection Outcome: Progressing Goal: Diagnostic test results will improve Outcome: Progressing Goal: Respiratory complications will improve Outcome: Progressing Goal: Cardiovascular complication will be avoided Outcome: Progressing   Problem: Activity: Goal: Risk for activity intolerance will decrease Outcome: Progressing   Problem: Nutrition: Goal: Adequate nutrition will be maintained Outcome: Progressing   Problem: Coping: Goal: Level of anxiety will decrease Outcome: Progressing   Problem: Elimination: Goal: Will not experience complications related to bowel motility Outcome: Progressing Goal: Will not experience complications related to urinary retention Outcome: Progressing   Problem: Pain Managment: Goal: General experience of comfort will improve Outcome: Progressing   Problem: Safety: Goal: Ability to remain free from injury will improve Outcome: Progressing   Problem: Skin Integrity: Goal: Risk for impaired skin integrity will decrease Outcome: Progressing   Problem: Education: Goal: Understanding of discharge needs will improve Outcome: Progressing Goal: Verbalization of understanding of the causes of altered bowel  function will improve Outcome: Progressing   Problem: Activity: Goal: Ability to tolerate increased activity will improve Outcome: Progressing   Problem: Bowel/Gastric: Goal: Gastrointestinal status for postoperative course will improve Outcome: Progressing   Problem: Health Behavior/Discharge Planning: Goal: Identification of community resources to assist with postoperative recovery needs will improve Outcome: Progressing   Problem: Nutritional: Goal: Will attain and maintain optimal nutritional status will improve Outcome: Progressing   Problem: Clinical Measurements: Goal: Postoperative complications will be avoided or minimized Outcome: Progressing   Problem: Respiratory: Goal: Respiratory status will improve Outcome: Progressing   Problem: Skin Integrity: Goal: Will show signs of wound healing Outcome: Progressing

## 2020-08-25 NOTE — Consult Note (Signed)
Medical Consultation  Gaddis Koser [id number] DOB: March 18, 1960 DOA: 08/12/2020 PCP: [name], MD   Requesting physician: Dr. Thermon Leyland Date of consultation: 08/25/20 Reason for consultation: Hyponatremia, fluid mgmt  Impression/Recommendations Hyponatremia     - admitted to surgical service     - I&O shows 4.7L up, but his volume status exam is more on the dry side    - check Uosm, UNa+, add 1L gentle normal saline    - repeat labs in AM, continue I&O    - no current neurological symptoms   Diverticulitis w/ perforation Abdominal wall infection s/p ex lap/debridements RUE/RLE DVT Acute blood loss anemia Hyperlgycemia Polyarticular pain/swelling     - per primary team, additional consultants  [facility] will follow-up again tomorrow. Please contact me if I can be of assistance in the meanwhile. Thank you for this consultation.  Chief Complaint: abdominal pain.  HPI:  Roger Brennan is a 60 y.o. male with no significant past medical history. Presenting with abdominal pain. He was admitted to the surgical service for diverticulitis w/ perforation. He had a partial colectomy and colostomy placement. He developed an abdominal wall infection requiring debridement. His stay has been complicated by DVTs and acute blood loss anemia.   Over the last several days, the patient has developed hyponatremia. It was noted last night that his sodium was 124. This morning it is 128. Since his the beginning of his admission, he is 4.7L fluid positive per documentation. [facility] was consulted for concern of fluid status and hyponatremia.   Review of Systems:  Denies CP, dyspnea, palpitations, peripheral edema, N/V. Remainder of ROS is negative for all not mentioned in HPI.  History reviewed. No pertinent past medical history. Past Surgical History:  Procedure Laterality Date   ABDOMINAL WALL DEFECT REPAIR N/A 08/15/2020   Procedure: REPAIR ABDOMINAL WALL/REEXPLORATION OF ABDOMINAL WALL,  IRRIGATION AND DEBRIDEMENT;  Surgeon: Erroll Luna, MD;  Location: [facility];  Service: General;  Laterality: N/A;   LAPAROTOMY N/A 08/14/2020   Procedure: EXPLORATORY LAPAROTOMY, REPAIR PERFORATED DIVERTICULUM;  Surgeon: Kieth Brightly Arta Bruce, MD;  Location: [facility];  Service: General;  Laterality: N/A;   PARTIAL COLECTOMY N/A 08/12/2020   Procedure: EXPLORATORY LAPAROTOMY,PARTIAL COLECTOMY;  Surgeon: Mickeal Skinner, MD;  Location: [facility];  Service: General;  Laterality: N/A;   Social History:  reports that he has never smoked. He has never been exposed to tobacco smoke. He has never used smokeless tobacco. He reports that he does not drink alcohol and does not use drugs.  No Known Allergies History reviewed. No pertinent family history.  Prior to Admission medications   Medication Sig Start Date End Date Taking? Authorizing Provider  aspirin EC 81 MG tablet Take 81 mg by mouth daily. Swallow whole.   Yes [provider]  traMADol (ULTRAM) 50 MG tablet Take 50 mg by mouth as needed (pain).   Yes [provider]   Physical Exam: Blood pressure 120/60, pulse (!) 101, temperature 99.1 F (37.3 C), temperature source Oral, resp. rate 17, height '5\' 10"'$  (1.778 m), weight 79.8 kg, SpO2 97 %. Vitals:   08/25/20 0011 08/25/20 0427  BP: 126/68 120/60  Pulse: (!) 102 (!) 101  Resp: 17 17  Temp: 98.8 F (37.1 C) 99.1 F (37.3 C)  SpO2: 100% 97%    General: 60 y.o. male resting in bed in NAD Eyes: PERRL, normal sclera ENMT: Nares patent w/o discharge, orophaynx clear, dentition normal, ears w/o discharge/lesions/ulcers Neck: Supple, trachea midline Cardiovascular: RRR, +S1, S2, no  m/g/r, equal pulses throughout Respiratory: CTABL, no w/r/r, normal WOB GI: BS hypoactive, ND, colostomy noted, surgical bandaging C/D/I, no masses noted, no organomegaly noted MSK: No e/c/c Neuro: A&O x 3, no focal deficits Psyc: Appropriate interaction and affect, calm/cooperative  Labs  on Admission:  Basic Metabolic Panel: Recent Labs  Lab 08/19/20 0500 08/19/20 1236 08/20/20 0500 08/20/20 0755 08/24/20 2113 08/25/20 0613  NA 135 SPECIMEN CONTAMINATED, UNABLE TO PERFORM TEST(S).  --  131* 124* 128*  K 3.8 SPECIMEN CONTAMINATED, UNABLE TO PERFORM TEST(S).  --  4.0 3.6 3.8  CL 105 SPECIMEN CONTAMINATED, UNABLE TO PERFORM TEST(S).  --  99 94* 97*  CO2 23 SPECIMEN CONTAMINATED, UNABLE TO PERFORM TEST(S).  --  '23 23 24  '$ GLUCOSE 184* SPECIMEN CONTAMINATED, UNABLE TO PERFORM TEST(S).  --  116* 186* 190*  BUN 21* SPECIMEN CONTAMINATED, UNABLE TO PERFORM TEST(S).  --  '14 17 14  '$ CREATININE 0.57*  0.55* SPECIMEN CONTAMINATED, UNABLE TO PERFORM TEST(S).  --  0.70 0.69 0.68  CALCIUM 7.9* SPECIMEN CONTAMINATED, UNABLE TO PERFORM TEST(S).  --  8.2* 7.5* 7.9*  MG 1.8  --  1.7  --   --   --   PHOS 3.1  --  3.4  --   --   --    Liver Function Tests: Recent Labs  Lab 08/19/20 1236  AST SPECIMEN CONTAMINATED, UNABLE TO PERFORM TEST(S).  ALT SPECIMEN CONTAMINATED, UNABLE TO PERFORM TEST(S).  ALKPHOS SPECIMEN CONTAMINATED, UNABLE TO PERFORM TEST(S).  BILITOT SPECIMEN CONTAMINATED, UNABLE TO PERFORM TEST(S).  PROT SPECIMEN CONTAMINATED, UNABLE TO PERFORM TEST(S).  ALBUMIN SPECIMEN CONTAMINATED, UNABLE TO PERFORM TEST(S).   No results for input(s): LIPASE, AMYLASE in the last 168 hours. No results for input(s): AMMONIA in the last 168 hours. CBC: Recent Labs  Lab 08/22/20 0415 08/22/20 1833 08/23/20 0749 08/24/20 0754 08/24/20 2113 08/25/20 0613  WBC 15.4*  --  20.4* 16.7* 19.1* 19.1*  HGB 5.6* 8.1* 7.1* 5.7* 7.1* 6.4*  HCT 17.0* 24.5* 21.6* 17.5* 21.4* 19.5*  MCV 95.5  --  96.0 95.1 93.9 94.2  PLT 338  --  404* 398 450* 472*   Cardiac Enzymes: No results for input(s): CKTOTAL, CKMB, CKMBINDEX, TROPONINI in the last 168 hours. BNP: Invalid input(s): POCBNP CBG: Recent Labs  Lab 08/24/20 1811 08/24/20 2038 08/25/20 0021 08/25/20 0430 08/25/20 0728  GLUCAP 312*  208* 187* 162* 164*    Radiological Exams on Admission: DG Chest 2 View  Result Date: 08/24/2020 CLINICAL DATA:  Fever and abdominal pain. EXAM: CHEST - 2 VIEW COMPARISON:  08/12/2020; CT abdomen pelvis-08/12/2020 FINDINGS: Examination is degraded due to patient body habitus. Grossly unchanged cardiac silhouette and mediastinal contours given reduced lung volumes. Right upper extremity approach PICC line tip projects over the superior cavoatrial junction. Interval development of suspected small left-sided effusion with worsening bibasilar heterogeneous opacities. Pulmonary venous congestion without frank evidence of edema. No pneumothorax. No acute osseous abnormalities. Degenerative change the right AC joint is suspected though incompletely evaluated. IMPRESSION: 1. Interval development of suspected small left-sided effusion with bibasilar heterogeneous opacities, atelectasis versus infiltrate/aspiration. Continued attention on follow-up is advised. 2. Right upper extremity approach PICC line tip projects over the superior cavoatrial junction. Electronically Signed   By: Sandi Mariscal M.D.   On: 08/24/2020 14:37   DG ELBOW COMPLETE LEFT (3+VIEW)  Result Date: 08/24/2020 CLINICAL DATA:  History of gout, now with right elbow pain and swelling. No known injury. EXAM: LEFT ELBOW - COMPLETE 3+ VIEW COMPARISON:  None. FINDINGS:  Marked soft tissue swelling about the posterior aspect of the elbow without associated fracture or elbow joint effusion. Potential tiny (4 mm) focus of subcutaneous emphysema is seen about the posterolateral aspect of the imaged upper arm. No radiopaque foreign body. No discrete areas of osteolysis to suggest osteomyelitis. IMPRESSION: Marked soft tissue swelling about the posterior aspect of the elbow with potential tiny focus of subcutaneous emphysema about the posterior aspect of the upper arm but without associated fracture, joint effusion or radiographic evidence of osteomyelitis.  Clinical correlation is advised. Electronically Signed   By: Sandi Mariscal M.D.   On: 08/24/2020 14:40   DG ELBOW COMPLETE RIGHT (3+VIEW)  Result Date: 08/24/2020 CLINICAL DATA:  Right elbow pain and swelling.  History of gout. EXAM: RIGHT ELBOW - COMPLETE 3+ VIEW COMPARISON:  None. FINDINGS: There is diffuse soft tissue swelling about the posterior aspect of the elbow without associated fracture or elbow joint effusion, though note, the provided lateral radiographs are degraded secondary to obliquity. Joint spaces appear preserved. Minimal enthesopathic change about the medial and lateral epicondyles. No erosions. No radiopaque foreign body. No subcutaneous emphysema. IMPRESSION: 1. Nonspecific diffuse soft tissue swelling about the elbow without associated fracture or radiopaque foreign body. 2. Minimal enthesopathic change about the medial and lateral epicondyles, the sequela of remote avulsive injury. Electronically Signed   By: Sandi Mariscal M.D.   On: 08/24/2020 14:47   DG Knee Complete 4 Views Right  Result Date: 08/24/2020 CLINICAL DATA:  History of gout, now with right knee pain and swelling. EXAM: RIGHT KNEE - COMPLETE 4+ VIEW COMPARISON:  None. FINDINGS: Small knee joint effusion. No fracture or evidence of lipohemarthrosis. Mild-to-moderate tricompartmental degenerative change of the knee, worse within the patellofemoral joint with joint space loss, subchondral sclerosis, articular surface irregularity and osteophytosis. No evidence of chondrocalcinosis. No radiopaque foreign body. IMPRESSION: 1. Small knee joint effusion.  Otherwise, no acute findings. 2. Mild-to-moderate tricompartmental degenerative change of the knee. Electronically Signed   By: Sandi Mariscal M.D.   On: 08/24/2020 14:42    EKG: None recently obtained  Status is: Inpatient  Time spent: 45 minutes  [facility]  If 7PM-7AM, please contact night-coverage [email or web address] 08/25/2020, 11:09 AM

## 2020-08-25 NOTE — Progress Notes (Signed)
Inpatient Rehab Admissions Coordinator:   I do not have a CIR bed for this Pt. Today due to medical instability (hgb of 6/4) and lack of insurance auth. I will continue to follow for potential admission pending insurance auth, bed availability, and medical readiness.  Clemens Catholic, [place], [place] Admissions Coordinator  [phone] ([place]) [phone] (office)

## 2020-08-25 NOTE — Consult Note (Signed)
Reason for Consult: right knee, bilateral elbow pain  Referring Physician: CCS, MD  Roger Brennan is an 60 y.o. male.  HPI:  Admitted 7/14 for bowel perforation and abscess.  He has undergone several abdominal procedures related to severity of illness associated with his bowel perforation.  He complains of pain in his left elbow, right elbow and right knee with the inability to range them without significant pain.  Pain in these areas began about 3 days ago without trauma.  Reports only prior orthopedic surgery was a meniscus repair of the left knee.  No trauma/fall. Reports hx of gout to big toe once before. Never to these areas.   History reviewed. No pertinent past medical history.  Past Surgical History:  Procedure Laterality Date   ABDOMINAL WALL DEFECT REPAIR N/A 08/15/2020   Procedure: REPAIR ABDOMINAL WALL/REEXPLORATION OF ABDOMINAL WALL, IRRIGATION AND DEBRIDEMENT;  Surgeon: Erroll Luna, MD;  Location: [facility];  Service: General;  Laterality: N/A;   LAPAROTOMY N/A 08/14/2020   Procedure: EXPLORATORY LAPAROTOMY, REPAIR PERFORATED DIVERTICULUM;  Surgeon: Kieth Brightly Arta Bruce, MD;  Location: [facility];  Service: General;  Laterality: N/A;   PARTIAL COLECTOMY N/A 08/12/2020   Procedure: EXPLORATORY LAPAROTOMY,PARTIAL COLECTOMY;  Surgeon: Mickeal Skinner, MD;  Location: [facility];  Service: General;  Laterality: N/A;    History reviewed. No pertinent family history.  Social History:  reports that he has never smoked. He has never been exposed to tobacco smoke. He has never used smokeless tobacco. He reports that he does not drink alcohol and does not use drugs.  Allergies: No Known Allergies  Medications: I have reviewed the patient's current medications. Scheduled:  sodium chloride   Intravenous Once   Chlorhexidine Gluconate Cloth  6 each Topical Daily   feeding supplement  1 Container Oral BID BM   feeding supplement  237 mL Oral BID BM   insulin aspart  0-15 Units  Subcutaneous TID WC   ketorolac  15 mg Intravenous Q6H   mouth rinse  15 mL Mouth Rinse BID   methocarbamol  1,000 mg Oral TID   multivitamin with minerals  1 tablet Oral Daily   nutrition supplement (JUVEN)  1 packet Oral BID BM   pantoprazole (PROTONIX) IV  40 mg Intravenous Q12H   sodium chloride flush  10-40 mL Intracatheter Q12H    Results for orders placed or performed during the hospital encounter of 08/12/20 (from the past 24 hour(s))  Glucose, capillary     Status: Abnormal   Collection Time: 08/24/20  8:38 PM  Result Value Ref Range   Glucose-Capillary 208 (H) 70 - 99 mg/dL   Comment 1 Notify RN    Comment 2 Document in Chart   Basic metabolic panel     Status: Abnormal   Collection Time: 08/24/20  9:13 PM  Result Value Ref Range   Sodium 124 (L) 135 - 145 mmol/L   Potassium 3.6 3.5 - 5.1 mmol/L   Chloride 94 (L) 98 - 111 mmol/L   CO2 23 22 - 32 mmol/L   Glucose, Bld 186 (H) 70 - 99 mg/dL   BUN 17 6 - 20 mg/dL   Creatinine, Ser 0.69 0.61 - 1.24 mg/dL   Calcium 7.5 (L) 8.9 - 10.3 mg/dL   GFR, Estimated >60 >60 mL/min   Anion gap 7 5 - 15  Sedimentation rate     Status: Abnormal   Collection Time: 08/24/20  9:13 PM  Result Value Ref Range   Sed Rate 140 (H)  0 - 16 mm/hr  C-reactive protein     Status: Abnormal   Collection Time: 08/24/20  9:13 PM  Result Value Ref Range   CRP 25.7 (H) <1.0 mg/dL  CBC     Status: Abnormal   Collection Time: 08/24/20  9:13 PM  Result Value Ref Range   WBC 19.1 (H) 4.0 - 10.5 K/uL   RBC 2.28 (L) 4.22 - 5.81 MIL/uL   Hemoglobin 7.1 (L) 13.0 - 17.0 g/dL   HCT 21.4 (L) 39.0 - 52.0 %   MCV 93.9 80.0 - 100.0 fL   MCH 31.1 26.0 - 34.0 pg   MCHC 33.2 30.0 - 36.0 g/dL   RDW 17.2 (H) 11.5 - 15.5 %   Platelets 450 (H) 150 - 400 K/uL   nRBC 0.0 0.0 - 0.2 %  Glucose, capillary     Status: Abnormal   Collection Time: 08/25/20 12:21 AM  Result Value Ref Range   Glucose-Capillary 187 (H) 70 - 99 mg/dL   Comment 1 Notify RN    Comment 2  Document in Chart   Glucose, capillary     Status: Abnormal   Collection Time: 08/25/20  4:30 AM  Result Value Ref Range   Glucose-Capillary 162 (H) 70 - 99 mg/dL  Urinalysis, Complete w Microscopic Urine, Clean Catch     Status: Abnormal   Collection Time: 08/25/20  6:01 AM  Result Value Ref Range   Color, Urine YELLOW YELLOW   APPearance CLEAR CLEAR   Specific Gravity, Urine 1.019 1.005 - 1.030   pH 5.0 5.0 - 8.0   Glucose, UA NEGATIVE NEGATIVE mg/dL   Hgb urine dipstick NEGATIVE NEGATIVE   Bilirubin Urine NEGATIVE NEGATIVE   Ketones, ur NEGATIVE NEGATIVE mg/dL   Protein, ur 30 (A) NEGATIVE mg/dL   Nitrite NEGATIVE NEGATIVE   Leukocytes,Ua NEGATIVE NEGATIVE   RBC / HPF 0-5 0 - 5 RBC/hpf   WBC, UA 0-5 0 - 5 WBC/hpf   Bacteria, UA RARE (A) NONE SEEN   Mucus PRESENT   CBC     Status: Abnormal   Collection Time: 08/25/20  6:13 AM  Result Value Ref Range   WBC 19.1 (H) 4.0 - 10.5 K/uL   RBC 2.07 (L) 4.22 - 5.81 MIL/uL   Hemoglobin 6.4 (LL) 13.0 - 17.0 g/dL   HCT 19.5 (L) 39.0 - 52.0 %   MCV 94.2 80.0 - 100.0 fL   MCH 30.9 26.0 - 34.0 pg   MCHC 32.8 30.0 - 36.0 g/dL   RDW 17.2 (H) 11.5 - 15.5 %   Platelets 472 (H) 150 - 400 K/uL   nRBC 0.0 0.0 - 0.2 %  Basic metabolic panel     Status: Abnormal   Collection Time: 08/25/20  6:13 AM  Result Value Ref Range   Sodium 128 (L) 135 - 145 mmol/L   Potassium 3.8 3.5 - 5.1 mmol/L   Chloride 97 (L) 98 - 111 mmol/L   CO2 24 22 - 32 mmol/L   Glucose, Bld 190 (H) 70 - 99 mg/dL   BUN 14 6 - 20 mg/dL   Creatinine, Ser 0.68 0.61 - 1.24 mg/dL   Calcium 7.9 (L) 8.9 - 10.3 mg/dL   GFR, Estimated >60 >60 mL/min   Anion gap 7 5 - 15  Glucose, capillary     Status: Abnormal   Collection Time: 08/25/20  7:28 AM  Result Value Ref Range   Glucose-Capillary 164 (H) 70 - 99 mg/dL  Prepare RBC (crossmatch)  Status: None   Collection Time: 08/25/20  7:44 AM  Result Value Ref Range   Order Confirmation      ORDER PROCESSED BY BLOOD  BANK Performed at [facility], [place] [address] [place], [place] [address]   Glucose, capillary     Status: Abnormal   Collection Time: 08/25/20 11:57 AM  Result Value Ref Range   Glucose-Capillary 220 (H) 70 - 99 mg/dL  Sodium, urine, random     Status: None   Collection Time: 08/25/20  4:55 PM  Result Value Ref Range   Sodium, Ur 49 mmol/L  Glucose, capillary     Status: Abnormal   Collection Time: 08/25/20  5:14 PM  Result Value Ref Range   Glucose-Capillary 159 (H) 70 - 99 mg/dL    X-ray: CLINICAL DATA:  History of gout, now with right elbow pain and swelling. No known injury.   EXAM: LEFT ELBOW - COMPLETE 3+ VIEW   COMPARISON:  None.   FINDINGS: Marked soft tissue swelling about the posterior aspect of the elbow without associated fracture or elbow joint effusion. Potential tiny (4 mm) focus of subcutaneous emphysema is seen about the posterolateral aspect of the imaged upper arm. No radiopaque foreign body. No discrete areas of osteolysis to suggest osteomyelitis.   IMPRESSION: Marked soft tissue swelling about the posterior aspect of the elbow with potential tiny focus of subcutaneous emphysema about the posterior aspect of the upper arm but without associated fracture, joint effusion or radiographic evidence of osteomyelitis. Clinical correlation is advised.     Electronically Signed   By: Sandi Mariscal M.D.  CLINICAL DATA:  Right elbow pain and swelling.  History of gout.   EXAM: RIGHT ELBOW - COMPLETE 3+ VIEW   COMPARISON:  None.   FINDINGS: There is diffuse soft tissue swelling about the posterior aspect of the elbow without associated fracture or elbow joint effusion, though note, the provided lateral radiographs are degraded secondary to obliquity.   Joint spaces appear preserved. Minimal enthesopathic change about the medial and lateral epicondyles. No erosions. No radiopaque foreign body. No subcutaneous emphysema.    IMPRESSION: 1. Nonspecific diffuse soft tissue swelling about the elbow without associated fracture or radiopaque foreign body. 2. Minimal enthesopathic change about the medial and lateral epicondyles, the sequela of remote avulsive injury.     Electronically Signed   By: Sandi Mariscal M.D.  CLINICAL DATA:  History of gout, now with right knee pain and swelling.   EXAM: RIGHT KNEE - COMPLETE 4+ VIEW   COMPARISON:  None.   FINDINGS: Small knee joint effusion. No fracture or evidence of lipohemarthrosis.   Mild-to-moderate tricompartmental degenerative change of the knee, worse within the patellofemoral joint with joint space loss, subchondral sclerosis, articular surface irregularity and osteophytosis. No evidence of chondrocalcinosis. No radiopaque foreign body.   IMPRESSION: 1. Small knee joint effusion.  Otherwise, no acute findings. 2. Mild-to-moderate tricompartmental degenerative change of the knee.     Electronically Signed   By: Sandi Mariscal M.D.  ROS: As per HPI  Blood pressure 126/78, pulse 100, temperature 98.8 F (37.1 C), temperature source Oral, resp. rate 17, height '5\' 10"'$  (1.778 m), weight 79.8 kg, SpO2 98 %.  Physical Exam: Awake and alert pleasant gentleman who has obviously been through a lot over past 2 weeks  General medical exam deferred to primary team at this point  Right knee exam: Mild warmth Moderate effusion noted Slight flexion contracture Mild pain with passive ROM assessed  in supine position  Left elbow exam: Mild erythema Some soft tissue swelling about the elbow somewhat non specific but in location of elbow and olecranon bursa  Right elbow exam: Similar to left but without the erythema  Pain with active and passive motion to both elbow related to stiffness  NVI BUE  Assessment/Plan: New relatively acute onset of polyarthralgia (right knee, bilateral elbows)  Plan: Uncertain etiology to this presentation but infection  less likely due to the broad spectrum antibiotics that he has been on while here Gout likely related to systemic stress  Today I aspirated his right knee and sent it to path for cell count, gram stain and to evaluate for crystals.  I aspirated 55cc of non purulent joint fluid, questionable inflamed fluid vs normal.  Recommended to the primary team to consider Toradol to help with pain as well as ice. If knee aspirate reveals crystals then treatment for gout can ensue  As for the elbows I would treat as above again noting that he has been on broad spectrum antibiotics for awhile seemingly making infectious etiology less likely  We will continue to follow and monitor the condition of his joints  Mauri Pole 08/25/2020, 7:04 PM

## 2020-08-25 NOTE — Progress Notes (Signed)
10 Days Post-Op  Subjective: CC: Doing well. No real pain in his abdomen except with dressing changes. Tolerating diet and shakes without n/v. Having ostomy output. Still dark. No CP or SOB. No urinary symptoms. Still having pain in b/l elbows and R knee. Seen by Ortho this AM.   Objective: Vital signs in last 24 hours: Temp:  [98.4 F (36.9 C)-99.6 F (37.6 C)] 99.1 F (37.3 C) (07/26 0427) Pulse Rate:  [94-118] 101 (07/26 0427) Resp:  [16-17] 17 (07/26 0427) BP: (114-134)/(60-70) 120/60 (07/26 0427) SpO2:  [94 %-100 %] 97 % (07/26 0427) Last BM Date: 08/23/20  Intake/Output from previous day: 07/25 0701 - 07/26 0700 In: 1667 [P.O.:480; I.V.:500; Blood:687] Out: 675 [Urine:675] Intake/Output this shift: No intake/output data recorded.  PE: Gen:  Alert, NAD, pleasant HEENT: EOM's intact, pupils equal and round Card: Tachycardic with regular rhythm  Pulm:  CTAB, no W/R/R, effort normal. On RA Abd: Soft, ND, appropriately tender around wounds, +BS, colostomy bag with black stool. Unable to visualize colostomy 2/2 stool. Drain SS. Wounds stable from yesterday. LLQ wound with healthy granulation tissue at the base. Midline wound with mixed granulation and fatty tissue without dehiscence or evisceration. The inferior aspect of the midline wound tunnels and connects to the LLQ wound. RLQ wound with healthy grannulation tissue at the base. No drainage. No periwound cellulitis.  GU: No foley Ext: Left elbow swelling and tenderness. Pain with rom. Right elbow swelling and tenderness. Pain with rom. Right knee swelling and tenderness. Pain with rom. Radial and DP 2+.   Psych: A&Ox3 Skin: no rashes noted, warm and dry  Lab Results:  Recent Labs    08/24/20 2113 08/25/20 0613  WBC 19.1* 19.1*  HGB 7.1* 6.4*  HCT 21.4* 19.5*  PLT 450* 472*   BMET Recent Labs    08/24/20 2113 08/25/20 0613  NA 124* 128*  K 3.6 3.8  CL 94* 97*  CO2 23 24  GLUCOSE 186* 190*  BUN 17 14   CREATININE 0.69 0.68  CALCIUM 7.5* 7.9*   PT/INR No results for input(s): LABPROT, INR in the last 72 hours. CMP     Component Value Date/Time   NA 128 (L) 08/25/2020 0613   K 3.8 08/25/2020 0613   CL 97 (L) 08/25/2020 0613   CO2 24 08/25/2020 0613   GLUCOSE 190 (H) 08/25/2020 0613   BUN 14 08/25/2020 0613   CREATININE 0.68 08/25/2020 0613   CALCIUM 7.9 (L) 08/25/2020 0613   PROT SPECIMEN CONTAMINATED, UNABLE TO PERFORM TEST(S). 08/19/2020 1236   ALBUMIN SPECIMEN CONTAMINATED, UNABLE TO PERFORM TEST(S). 08/19/2020 1236   AST SPECIMEN CONTAMINATED, UNABLE TO PERFORM TEST(S). 08/19/2020 1236   ALT SPECIMEN CONTAMINATED, UNABLE TO PERFORM TEST(S). 08/19/2020 1236   ALKPHOS SPECIMEN CONTAMINATED, UNABLE TO PERFORM TEST(S). 08/19/2020 1236   BILITOT SPECIMEN CONTAMINATED, UNABLE TO PERFORM TEST(S). 08/19/2020 1236   GFRNONAA >60 08/25/2020 0613   GFRAA SPECIMEN CONTAMINATED, UNABLE TO PERFORM TEST(S). 08/19/2020 1236   Lipase     Component Value Date/Time   LIPASE 18 08/12/2020 1010    Studies/Results: DG Chest 2 View  Result Date: 08/24/2020 CLINICAL DATA:  Fever and abdominal pain. EXAM: CHEST - 2 VIEW COMPARISON:  08/12/2020; CT abdomen pelvis-08/12/2020 FINDINGS: Examination is degraded due to patient body habitus. Grossly unchanged cardiac silhouette and mediastinal contours given reduced lung volumes. Right upper extremity approach PICC line tip projects over the superior cavoatrial junction. Interval development of suspected small left-sided effusion with worsening bibasilar heterogeneous  opacities. Pulmonary venous congestion without frank evidence of edema. No pneumothorax. No acute osseous abnormalities. Degenerative change the right AC joint is suspected though incompletely evaluated. IMPRESSION: 1. Interval development of suspected small left-sided effusion with bibasilar heterogeneous opacities, atelectasis versus infiltrate/aspiration. Continued attention on follow-up is  advised. 2. Right upper extremity approach PICC line tip projects over the superior cavoatrial junction. Electronically Signed   By: Sandi Mariscal M.D.   On: 08/24/2020 14:37   DG ELBOW COMPLETE LEFT (3+VIEW)  Result Date: 08/24/2020 CLINICAL DATA:  History of gout, now with right elbow pain and swelling. No known injury. EXAM: LEFT ELBOW - COMPLETE 3+ VIEW COMPARISON:  None. FINDINGS: Marked soft tissue swelling about the posterior aspect of the elbow without associated fracture or elbow joint effusion. Potential tiny (4 mm) focus of subcutaneous emphysema is seen about the posterolateral aspect of the imaged upper arm. No radiopaque foreign body. No discrete areas of osteolysis to suggest osteomyelitis. IMPRESSION: Marked soft tissue swelling about the posterior aspect of the elbow with potential tiny focus of subcutaneous emphysema about the posterior aspect of the upper arm but without associated fracture, joint effusion or radiographic evidence of osteomyelitis. Clinical correlation is advised. Electronically Signed   By: Sandi Mariscal M.D.   On: 08/24/2020 14:40   DG ELBOW COMPLETE RIGHT (3+VIEW)  Result Date: 08/24/2020 CLINICAL DATA:  Right elbow pain and swelling.  History of gout. EXAM: RIGHT ELBOW - COMPLETE 3+ VIEW COMPARISON:  None. FINDINGS: There is diffuse soft tissue swelling about the posterior aspect of the elbow without associated fracture or elbow joint effusion, though note, the provided lateral radiographs are degraded secondary to obliquity. Joint spaces appear preserved. Minimal enthesopathic change about the medial and lateral epicondyles. No erosions. No radiopaque foreign body. No subcutaneous emphysema. IMPRESSION: 1. Nonspecific diffuse soft tissue swelling about the elbow without associated fracture or radiopaque foreign body. 2. Minimal enthesopathic change about the medial and lateral epicondyles, the sequela of remote avulsive injury. Electronically Signed   By: Sandi Mariscal M.D.    On: 08/24/2020 14:47   DG Knee Complete 4 Views Right  Result Date: 08/24/2020 CLINICAL DATA:  History of gout, now with right knee pain and swelling. EXAM: RIGHT KNEE - COMPLETE 4+ VIEW COMPARISON:  None. FINDINGS: Small knee joint effusion. No fracture or evidence of lipohemarthrosis. Mild-to-moderate tricompartmental degenerative change of the knee, worse within the patellofemoral joint with joint space loss, subchondral sclerosis, articular surface irregularity and osteophytosis. No evidence of chondrocalcinosis. No radiopaque foreign body. IMPRESSION: 1. Small knee joint effusion.  Otherwise, no acute findings. 2. Mild-to-moderate tricompartmental degenerative change of the knee. Electronically Signed   By: Sandi Mariscal M.D.   On: 08/24/2020 14:42    Anti-infectives: Anti-infectives (From admission, onward)    Start     Dose/Rate Route Frequency Ordered Stop   08/15/20 1000  anidulafungin (ERAXIS) 100 mg in sodium chloride 0.9 % 100 mL IVPB        100 mg 78 mL/hr over 100 Minutes Intravenous Every 24 hours 08/14/20 1349 08/20/20 1326   08/14/20 1600  anidulafungin (ERAXIS) 200 mg in sodium chloride 0.9 % 200 mL IVPB        200 mg 78 mL/hr over 200 Minutes Intravenous  Once 08/14/20 1349 08/14/20 2000   08/14/20 1000  vancomycin (VANCOREADY) IVPB 1500 mg/300 mL  Status:  Discontinued        1,500 mg 150 mL/hr over 120 Minutes Intravenous Every 24 hours 08/13/20 1009 08/14/20 0901  08/13/20 1000  vancomycin (VANCOREADY) IVPB 1250 mg/250 mL  Status:  Discontinued        1,250 mg 166.7 mL/hr over 90 Minutes Intravenous Every 24 hours 08/12/20 1910 08/13/20 1009   08/12/20 2200  piperacillin-tazobactam (ZOSYN) IVPB 3.375 g  Status:  Discontinued        3.375 g 12.5 mL/hr over 240 Minutes Intravenous Every 8 hours 08/12/20 1904 08/12/20 1941   08/12/20 2000  clindamycin (CLEOCIN) IVPB 600 mg  Status:  Discontinued        600 mg 100 mL/hr over 30 Minutes Intravenous Every 8 hours 08/12/20  1757 08/17/20 0844   08/12/20 1830  piperacillin-tazobactam (ZOSYN) IVPB 3.375 g        3.375 g 12.5 mL/hr over 240 Minutes Intravenous Every 8 hours 08/12/20 1811 08/26/20 1829   08/12/20 1015  vancomycin (VANCOREADY) IVPB 1500 mg/300 mL        1,500 mg 150 mL/hr over 120 Minutes Intravenous  Once 08/12/20 1007 08/12/20 1330   08/12/20 1000  ceFEPIme (MAXIPIME) 2 g in sodium chloride 0.9 % 100 mL IVPB        2 g 200 mL/hr over 30 Minutes Intravenous  Once 08/12/20 1000 08/12/20 1121   08/12/20 1000  metroNIDAZOLE (FLAGYL) IVPB 500 mg        500 mg 100 mL/hr over 60 Minutes Intravenous  Once 08/12/20 1000 08/12/20 1226   08/12/20 1000  vancomycin (VANCOCIN) IVPB 1000 mg/200 mL premix  Status:  Discontinued        1,000 mg 200 mL/hr over 60 Minutes Intravenous  Once 08/12/20 1000 08/12/20 1007        Assessment/Plan POD 13 s/p drainage and debridement of LLQ abdominal wall 15 x 20 cm, partial colectomy with end colostomy, Splenic mobilization by Dr. Kieth Brightly on 08/12/20 for abdominal wall abscess, diverticulitis POD 11 s/p reopening of recent laparotomy, lysis of adhesions for 45 min. Small bowel resection with anastomosis, enterrorhaphy x 2, debridement of 200 cm^2 of necrotic fat and fascia of abdominal wall by Dr. Kieth Brightly on 08/14/20 for intestinal injury POD 10 s/p Debridement of abdominal wall with above dimensions with sharp dissection and Pulsavac of wounds by Dr. Brantley Stage on 7/16 for Open abdominal wall wounds left flank, right lower quadrant total area 738 cm ^2  - The area left lower quadrants was 20 cm x 30 cm and midline 20 cm x 6 cm and right lower quadrant 6 x 3 cm - Path 7/13 w/ diverticulitis - no evidence of malignancy  - On soft diet. Nutrition following. - Colostomy functioning - Cont wound care. BID WTD. Monitor LLQ wound where I had to bluntly opened false bottom 7/21 and 7/25. Make sure this area is packed. - WOCN following. Had some leakage yesterday, none this  morning  - Cont abx. Comp 7d Eraxis, on Zosyn for 14d total - Cont drain. SS - Mobilize. PT/OT rec CIR - Pulm toilet   FEN - Soft diet. BID PPI. +4.7L since admission. Na 128. Will ask TRH to see to help w/ fluid and electrolyte balance VTE - SCDs, hold therapeutic Lovenox ID - Currently on Zosyn. Monitor fever curve. WBC 19 Foley - Out, voiding Dispo - CIR following   RUE/RLE DVT - Hold Lovenox today given continued ABL anemia in the setting of melanotic stools requiring multiple units of PRBC.  ABL anemia - Hgb 5.6 > 2U PRBC > 8.1 > 5.7 > 2U PRBC > 7.1 > 6.4 this AM.  Hold  Lovenox. Ordered 2U PRBC.  Hyperglycemia - stable. SSI Protein Calorie Malnutrition - Pre-alb < 5 -> 11.4. TPN off, on diet. Nutrition following for supplements.  L elbow, R elbow, and R knee pain/swelling - Appreciate Dr. Aurea Graff assistance. Discussed with him in person and he suspects this may be gout and will plan to aspirate the knee to confirm. Will discuss with attending if patient is okay for Toradol or Prednisone.    LOS: 13 days    Jillyn Ledger , [facility] 08/25/2020, 7:52 AM Please see Amion for pager number during day hours 7:00am-4:30pm

## 2020-08-25 NOTE — Progress Notes (Signed)
PT Cancellation Note  Patient Details Name: Roger Brennan MRN: [id number] DOB: 1960-07-28   Cancelled Treatment:     HgB well below parameters for Physical Therapy.   Will continue to monitor.   Nathanial Rancher 08/25/2020, 12:36 PM

## 2020-08-25 NOTE — Progress Notes (Signed)
OT Cancellation Note  Patient Details Name: Roger Brennan MRN: [id number] DOB: August 01, 1960   Cancelled Treatment:    Reason Eval/Treat Not Completed: Medical issues which prohibited therapy Patient's Hemoglobin is 6.4 and hematocrit 19.5 which is out of range for skilled therapy services. OT will continue to follow.  [name] OTR/L, [place] [facility] Office# [phone] Pager# [phone]   [place] 08/25/2020, 7:38 AM

## 2020-08-26 DIAGNOSIS — R1030 Lower abdominal pain, unspecified: Secondary | ICD-10-CM | POA: Diagnosis not present

## 2020-08-26 DIAGNOSIS — K631 Perforation of intestine (nontraumatic): Secondary | ICD-10-CM | POA: Diagnosis not present

## 2020-08-26 LAB — CBC
HCT: 25.4 % — ABNORMAL LOW (ref 39.0–52.0)
Hemoglobin: 8.5 g/dL — ABNORMAL LOW (ref 13.0–17.0)
MCH: 30.9 pg (ref 26.0–34.0)
MCHC: 33.5 g/dL (ref 30.0–36.0)
MCV: 92.4 fL (ref 80.0–100.0)
Platelets: 417 10*3/uL — ABNORMAL HIGH (ref 150–400)
RBC: 2.75 MIL/uL — ABNORMAL LOW (ref 4.22–5.81)
RDW: 15.9 % — ABNORMAL HIGH (ref 11.5–15.5)
WBC: 12.7 10*3/uL — ABNORMAL HIGH (ref 4.0–10.5)
nRBC: 0 % (ref 0.0–0.2)

## 2020-08-26 LAB — TYPE AND SCREEN
ABO/RH(D): O POS
Antibody Screen: NEGATIVE
Unit division: 0
Unit division: 0
Unit division: 0
Unit division: 0
Unit division: 0

## 2020-08-26 LAB — RENAL FUNCTION PANEL
Albumin: 1.7 g/dL — ABNORMAL LOW (ref 3.5–5.0)
Anion gap: 8 (ref 5–15)
BUN: 18 mg/dL (ref 6–20)
CO2: 23 mmol/L (ref 22–32)
Calcium: 7.8 mg/dL — ABNORMAL LOW (ref 8.9–10.3)
Chloride: 98 mmol/L (ref 98–111)
Creatinine, Ser: 0.79 mg/dL (ref 0.61–1.24)
GFR, Estimated: >60 mL/min (ref >60)
Glucose, Bld: 293 mg/dL — ABNORMAL HIGH (ref 70–99)
Phosphorus: 3.6 mg/dL (ref 2.5–4.6)
Potassium: 3.9 mmol/L (ref 3.5–5.1)
Sodium: 129 mmol/L — ABNORMAL LOW (ref 135–145)

## 2020-08-26 LAB — BPAM RBC
Blood Product Expiration Date: 202208232359
Blood Product Expiration Date: 202208262359
Blood Product Expiration Date: 202208262359
Blood Product Expiration Date: 202208262359
Blood Product Expiration Date: 202208262359
ISSUE DATE / TIME: 202207231031
ISSUE DATE / TIME: 202207231356
ISSUE DATE / TIME: 202207251307
ISSUE DATE / TIME: 202207251542
ISSUE DATE / TIME: 202207261325
Unit Type and Rh: 5100
Unit Type and Rh: 5100
Unit Type and Rh: 5100
Unit Type and Rh: 5100
Unit Type and Rh: 5100

## 2020-08-26 LAB — GLUCOSE, CAPILLARY
Glucose-Capillary: 150 mg/dL — ABNORMAL HIGH (ref 70–99)
Glucose-Capillary: 159 mg/dL — ABNORMAL HIGH (ref 70–99)
Glucose-Capillary: 191 mg/dL — ABNORMAL HIGH (ref 70–99)
Glucose-Capillary: 211 mg/dL — ABNORMAL HIGH (ref 70–99)
Glucose-Capillary: 231 mg/dL — ABNORMAL HIGH (ref 70–99)

## 2020-08-26 LAB — PREPARE RBC (CROSSMATCH)

## 2020-08-26 MED ORDER — SODIUM CHLORIDE 0.9 % IV SOLN: INTRAVENOUS | Status: AC

## 2020-08-26 NOTE — Progress Notes (Signed)
Inpatient Rehab Admissions Coordinator:   Pt. Does not appear medically ready for CIR at this time. AC team to follow and once he is stable and participating with therapies, we will re-evaluate candidacy.   Clemens Catholic, [place], [place] Admissions Coordinator  [phone] ([place]) [phone] (office)

## 2020-08-26 NOTE — Progress Notes (Signed)
Occupational Therapy Treatment Patient Details Name: Roger Brennan MRN: [id number] DOB: [birth year] Today's Date: 08/26/2020    History of present illness Patient is 60 yo  old male who presented to [facility]20 at the direction of PCP with LLQ abdominal pain and induration for the last 2-3 weeks, 20 pound weight loss.  CT-Colon perforation with abscess and fistulization to abdominal wall .  S/P drainage and debridement of left lower quadrant abdominal wall ,. partial colectomy with end colostomy , Splenic mobilization on 08/12/20 emergently. 7/15 intestinal drainage from the wound >> emergent exploration; recurrent peritonitis, LOA ,Small bowel resection with anastomosis, enterrorhaphy x 2, debridement of 200 cm^2 of necrotic fat and fascia of abdominal wall; 7/16 debridement of abdominal wall   OT comments  Treatment focused on functional mobility. Patient limited by bilateral elbow pain and right knee pani - with Right knee pain being the most significant. Patient was premedicated with dilaudid - but reports Toradol had been the most effective. Toradol had not been given prior to treatment. Patient max assist for supine to sit - patient did not tolerate attempt at log roll and unintentionally resisting therapist. Assistance needed for bilateral Les and trunk negotiation - therapist had to maintain right knee in extension until foot resting on the floor. Attempted to stand x 3 with increasing bed heights but patient could not power up. He was predominantly limited by pain. Patient did spend more time at edge of bed compared to prior treatments and exhibited disappointment with himself. Expect once pain managed - he will progress well. Therapist continues to recommend CIR at discharge.   Follow Up Recommendations  CIR    Equipment Recommendations  Tub/shower seat    Recommendations for Other Services      Precautions / Restrictions Precautions Precautions: Fall Precaution Comments: colostomy, R  JP drain, bandages over full abdomen,       Mobility Bed Mobility Overal bed mobility: Needs Assistance Bed Mobility: Supine to Sit     Supine to sit: Max assist     General bed mobility comments: Attempts to log roll did not work due to patient's pain in knees and patient somewhat resistant to log roll movement- performed supine to sit with max assist - requiring increased time and use of bed rails in addition to therapist's physical assistance.    Transfers                 General transfer comment: Attempted to stand x 3 - with therapist elevating the bed each time. Unable to come into full standing.    Balance Overall balance assessment: Needs assistance Sitting-balance support: No upper extremity supported;Feet supported   Sitting balance - Comments: able to sit unsupported mostly - tends to prop with UEs for pain.                                   ADL either performed or assessed with clinical judgement   ADL                                               Vision Patient Visual Report: No change from baseline     Perception     Praxis      Cognition Arousal/Alertness: Awake/alert Behavior During Therapy: WFL for tasks assessed/performed Overall Cognitive  Status: Within Functional Limits for tasks assessed                                          Exercises     Shoulder Instructions       General Comments      Pertinent Vitals/ Pain       Pain Assessment: 0-10 Pain Score: 8  Pain Location: R knee Pain Descriptors / Indicators: Discomfort;Grimacing;Guarding Pain Intervention(s): Premedicated before session;Monitored during session;Limited activity within patient's tolerance  Home Living                                          Prior Functioning/Environment              Frequency  Min 2X/week        Progress Toward Goals  OT Goals(current goals can now be found  in the care plan section)  Progress towards OT goals: Progressing toward goals  Acute Rehab OT Goals Patient Stated Goal: return to baseline OT Goal Formulation: With patient/family Time For Goal Achievement: 09/09/20 Potential to Achieve Goals: Good  Plan Discharge plan remains appropriate    Co-evaluation          OT goals addressed during session:  (functional mobility)      AM-PAC OT "6 Clicks" Daily Activity     Outcome Measure   Help from another person eating meals?: A Little Help from another person taking care of personal grooming?: A Little Help from another person toileting, which includes using toliet, bedpan, or urinal?: Total Help from another person bathing (including washing, rinsing, drying)?: A Lot Help from another person to put on and taking off regular upper body clothing?: A Little Help from another person to put on and taking off regular lower body clothing?: Total 6 Click Score: 13    End of Session Equipment Utilized During Treatment: Rolling walker  OT Visit Diagnosis: Other abnormalities of gait and mobility (R26.89);Pain   Activity Tolerance Patient limited by pain   Patient Left in bed;with call bell/phone within reach   Nurse Communication Mobility status        Time: [id number] OT Time Calculation (min): 32 min  Charges: OT General Charges $OT Visit: 1 Visit OT Treatments $Therapeutic Activity: 23-37 mins  Joh Rao, OTR/L [place]  Office [phone] Pager: [place] 08/26/2020, 3:43 PM

## 2020-08-26 NOTE — Progress Notes (Signed)
PROGRESS NOTE    Roger Brennan  [id number] DOB: 08-20-1960 DOA: 08/12/2020 PCP: [name], MD    Brief Narrative:  60 y.o. male with no significant past medical history. Presenting with abdominal pain. He was admitted to the surgical service for diverticulitis w/ perforation. He had a partial colectomy and colostomy placement. He developed an abdominal wall infection requiring debridement. His stay has been complicated by DVTs and acute blood loss anemia.    Over the last several days, the patient has developed hyponatremia. It was noted last night that his sodium was 124. This morning it is 128. Since his the beginning of his admission, he is 4.7L fluid positive per documentation. TRH was consulted for concern of fluid status and hyponatremia.  Assessment & Plan:   Active Problems:   Perforation of colon (HCC)   Severe sepsis (HCC)   Pressure injury of skin  Hyponatremia     -Sodium improved with IVF hydration     - mucus membranes still appear dry.      -sodium 129 this AM    - will continue limited course of gentle NS    - no current neurological symptoms    - repeat bmet in AM   Diverticulitis w/ perforation Abdominal wall infection s/p ex lap/debridements RUE/RLE DVT Acute blood loss anemia Hyperlgycemia Polyarticular pain/swelling     - per primary team, additional consultants  Antimicrobials: Anti-infectives (From admission, onward)    Start     Dose/Rate Route Frequency Ordered Stop   08/15/20 1000  anidulafungin (ERAXIS) 100 mg in sodium chloride 0.9 % 100 mL IVPB        100 mg 78 mL/hr over 100 Minutes Intravenous Every 24 hours 08/14/20 1349 08/20/20 1326   08/14/20 1600  anidulafungin (ERAXIS) 200 mg in sodium chloride 0.9 % 200 mL IVPB        200 mg 78 mL/hr over 200 Minutes Intravenous  Once 08/14/20 1349 08/14/20 2000   08/14/20 1000  vancomycin (VANCOREADY) IVPB 1500 mg/300 mL  Status:  Discontinued        1,500 mg 150 mL/hr over 120 Minutes  Intravenous Every 24 hours 08/13/20 1009 08/14/20 0901   08/13/20 1000  vancomycin (VANCOREADY) IVPB 1250 mg/250 mL  Status:  Discontinued        1,250 mg 166.7 mL/hr over 90 Minutes Intravenous Every 24 hours 08/12/20 1910 08/13/20 1009   08/12/20 2200  piperacillin-tazobactam (ZOSYN) IVPB 3.375 g  Status:  Discontinued        3.375 g 12.5 mL/hr over 240 Minutes Intravenous Every 8 hours 08/12/20 1904 08/12/20 1941   08/12/20 2000  clindamycin (CLEOCIN) IVPB 600 mg  Status:  Discontinued        600 mg 100 mL/hr over 30 Minutes Intravenous Every 8 hours 08/12/20 1757 08/17/20 0844   08/12/20 1830  piperacillin-tazobactam (ZOSYN) IVPB 3.375 g        3.375 g 12.5 mL/hr over 240 Minutes Intravenous Every 8 hours 08/12/20 1811 08/26/20 1829   08/12/20 1015  vancomycin (VANCOREADY) IVPB 1500 mg/300 mL        1,500 mg 150 mL/hr over 120 Minutes Intravenous  Once 08/12/20 1007 08/12/20 1330   08/12/20 1000  ceFEPIme (MAXIPIME) 2 g in sodium chloride 0.9 % 100 mL IVPB        2 g 200 mL/hr over 30 Minutes Intravenous  Once 08/12/20 1000 08/12/20 1121   08/12/20 1000  metroNIDAZOLE (FLAGYL) IVPB 500 mg  500 mg 100 mL/hr over 60 Minutes Intravenous  Once 08/12/20 1000 08/12/20 1226   08/12/20 1000  vancomycin (VANCOCIN) IVPB 1000 mg/200 mL premix  Status:  Discontinued        1,000 mg 200 mL/hr over 60 Minutes Intravenous  Once 08/12/20 1000 08/12/20 1007       Subjective: Reports feeling better today  Objective: Vitals:   08/26/20 0641 08/26/20 0931 08/26/20 1432 08/26/20 1435  BP: 129/74 120/74 128/85 (!) 131/98  Pulse: 97 90 95 96  Resp: '18 19 15 15  '$ Temp: 98.5 F (36.9 C) 98 F (36.7 C) (!) 97.4 F (36.3 C) 98.2 F (36.8 C)  TempSrc: Oral Oral Oral Oral  SpO2: 99% 99%  99%  Weight:      Height:        Intake/Output Summary (Last 24 hours) at 08/26/2020 1855 Last data filed at 08/26/2020 1700 Gross per 24 hour  Intake 1097.25 ml  Output 1245 ml  Net -147.75 ml    Filed Weights   08/14/20 1420 08/15/20 0445 08/21/20 1336  Weight: 83.8 kg 86.1 kg 79.8 kg    Examination: General exam: Awake, laying in bed, in nad Respiratory system: Normal respiratory effort, no wheezing Cardiovascular system: regular rate, s1, s2 Gastrointestinal system: Soft, nondistended, positive BS Central nervous system: CN2-12 grossly intact, strength intact Extremities: Perfused, no clubbing Skin: Normal skin turgor, no notable skin lesions seen Psychiatry: Mood normal // no visual hallucinations   Data Reviewed: I have personally reviewed following labs and imaging studies  CBC: Recent Labs  Lab 08/23/20 0749 08/24/20 0754 08/24/20 2113 08/25/20 0613 08/26/20 1206  WBC 20.4* 16.7* 19.1* 19.1* 12.7*  HGB 7.1* 5.7* 7.1* 6.4* 8.5*  HCT 21.6* 17.5* 21.4* 19.5* 25.4*  MCV 96.0 95.1 93.9 94.2 92.4  PLT 404* 398 450* 472* [id number]*   Basic Metabolic Panel: Recent Labs  Lab 08/20/20 0500 08/20/20 0755 08/24/20 2113 08/25/20 0613 08/26/20 1206  NA  --  131* 124* 128* 129*  K  --  4.0 3.6 3.8 3.9  CL  --  99 94* 97* 98  CO2  --  '23 23 24 23  '$ GLUCOSE  --  116* 186* 190* 293*  BUN  --  '14 17 14 18  '$ CREATININE  --  0.70 0.69 0.68 0.79  CALCIUM  --  8.2* 7.5* 7.9* 7.8*  MG 1.7  --   --   --   --   PHOS 3.4  --   --   --  3.6   GFR: Estimated Creatinine Clearance: 101.4 mL/min (by C-G formula based on SCr of 0.79 mg/dL). Liver Function Tests: Recent Labs  Lab 08/26/20 1206  ALBUMIN 1.7*   No results for input(s): LIPASE, AMYLASE in the last 168 hours. No results for input(s): AMMONIA in the last 168 hours. Coagulation Profile: No results for input(s): INR, PROTIME in the last 168 hours. Cardiac Enzymes: No results for input(s): CKTOTAL, CKMB, CKMBINDEX, TROPONINI in the last 168 hours. BNP (last 3 results) No results for input(s): PROBNP in the last 8760 hours. HbA1C: No results for input(s): HGBA1C in the last 72 hours. CBG: Recent Labs  Lab  08/26/20 0023 08/26/20 0435 08/26/20 0735 08/26/20 1207 08/26/20 1624  GLUCAP 191* 150* 159* 231* 211*   Lipid Profile: No results for input(s): CHOL, HDL, LDLCALC, TRIG, CHOLHDL, LDLDIRECT in the last 72 hours. Thyroid Function Tests: No results for input(s): TSH, T4TOTAL, FREET4, T3FREE, THYROIDAB in the last 72 hours. Anemia Panel: No  results for input(s): VITAMINB12, FOLATE, FERRITIN, TIBC, IRON, RETICCTPCT in the last 72 hours. Sepsis Labs: No results for input(s): PROCALCITON, LATICACIDVEN in the last 168 hours.  Recent Results (from the past 240 hour(s))  Body fluid culture w Gram Stain     Status: None (Preliminary result)   Collection Time: 08/25/20  3:08 PM   Specimen: KNEE; Synovial Fluid  Result Value Ref Range Status   Specimen Description   Final    KNEE RIGHT Performed at [facility] [address] [place], [place] [address]    Special Requests NONE  Final   Gram Stain   Final    ABUNDANT WBC PRESENT, PREDOMINANTLY PMN NO ORGANISMS SEEN    Culture   Final    NO GROWTH < 12 HOURS Performed at [facility] [address] [place], [place] [address]    Report Status PENDING  Incomplete     Radiology Studies: No results found.  Scheduled Meds:  sodium chloride   Intravenous Once   Chlorhexidine Gluconate Cloth  6 each Topical Daily   feeding supplement  1 Container Oral BID BM   feeding supplement  237 mL Oral BID BM   insulin aspart  0-15 Units Subcutaneous TID WC   mouth rinse  15 mL Mouth Rinse BID   methocarbamol  1,000 mg Oral TID   multivitamin with minerals  1 tablet Oral Daily   nutrition supplement (JUVEN)  1 packet Oral BID BM   pantoprazole (PROTONIX) IV  40 mg Intravenous Q12H   sodium chloride flush  10-40 mL Intracatheter Q12H   Continuous Infusions:  sodium chloride Stopped (08/14/20 0427)   sodium chloride Stopped (08/17/20 1800)   sodium chloride 75 mL/hr at 08/26/20 1351     LOS: 14 days   Marylu Lund, MD [facility] Pager On Amion  If 7PM-7AM, please contact night-coverage 08/26/2020, 6:55 PM

## 2020-08-26 NOTE — Progress Notes (Signed)
11 Days Post-Op  Subjective: CC: Doing well. No abdominal pain. Tolerating diet and shakes without n/v. Having colostomy output. Voiding. No CP, SOB or urinary symptoms. Less pain and increased rom of R/L elbow and R knee. Getting 2U of PRBC now.   Objective: Vital signs in last 24 hours: Temp:  [98 F (36.7 C)-99.4 F (37.4 C)] 98.5 F (36.9 C) (07/27 0641) Pulse Rate:  [92-106] 97 (07/27 0641) Resp:  [18-20] 18 (07/27 0641) BP: (107-129)/(56-78) 129/74 (07/27 0641) SpO2:  [97 %-100 %] 99 % (07/27 0641) Last BM Date: 08/25/20  Intake/Output from previous day: 07/26 0701 - 07/27 0700 In: 1056.7 [P.O.:600; Blood:456.7] Out: 720 [Urine:550; Drains:20; Stool:150] Intake/Output this shift: Total I/O In: 425 [Other:5; IV Piggyback:420] Out: 300 [Urine:300]  PE: Gen:  Alert, NAD, pleasant HEENT: EOM's intact, pupils equal and round Card: Tachycardic with regular rhythm  Pulm:  CTAB, no W/R/R, effort normal. On RA Abd: Soft, ND, appropriately tender around wounds, +BS, colostomy bag with black/brown stool. Unable to visualize colostomy 2/2 stool. Drain SS. Wounds stable. LLQ wound with healthy granulation tissue at the base. Midline wound with mixed granulation and fatty tissue without dehiscence or evisceration. The inferior aspect of the midline wound tunnels and connects to the LLQ wound. RLQ wound with healthy grannulation tissue at the base. No drainage. No periwound cellulitis.  GU: No foley Ext: Left elbow swelling and tenderness. Inc rom from yesterday. Right elbow swelling and tenderness. Inc rom from yesterday. Right knee swelling and tenderness. Inc rom from yesterday. Radial and DP 2+.   Psych: A&Ox3 Skin: no rashes noted, warm and dry  Lab Results:  Recent Labs    08/24/20 2113 08/25/20 0613  WBC 19.1* 19.1*  HGB 7.1* 6.4*  HCT 21.4* 19.5*  PLT 450* 472*   BMET Recent Labs    08/24/20 2113 08/25/20 0613  NA 124* 128*  K 3.6 3.8  CL 94* 97*  CO2 23 24   GLUCOSE 186* 190*  BUN 17 14  CREATININE 0.69 0.68  CALCIUM 7.5* 7.9*   PT/INR No results for input(s): LABPROT, INR in the last 72 hours. CMP     Component Value Date/Time   NA 128 (L) 08/25/2020 0613   K 3.8 08/25/2020 0613   CL 97 (L) 08/25/2020 0613   CO2 24 08/25/2020 0613   GLUCOSE 190 (H) 08/25/2020 0613   BUN 14 08/25/2020 0613   CREATININE 0.68 08/25/2020 0613   CALCIUM 7.9 (L) 08/25/2020 0613   PROT SPECIMEN CONTAMINATED, UNABLE TO PERFORM TEST(S). 08/19/2020 1236   ALBUMIN SPECIMEN CONTAMINATED, UNABLE TO PERFORM TEST(S). 08/19/2020 1236   AST SPECIMEN CONTAMINATED, UNABLE TO PERFORM TEST(S). 08/19/2020 1236   ALT SPECIMEN CONTAMINATED, UNABLE TO PERFORM TEST(S). 08/19/2020 1236   ALKPHOS SPECIMEN CONTAMINATED, UNABLE TO PERFORM TEST(S). 08/19/2020 1236   BILITOT SPECIMEN CONTAMINATED, UNABLE TO PERFORM TEST(S). 08/19/2020 1236   GFRNONAA >60 08/25/2020 0613   GFRAA SPECIMEN CONTAMINATED, UNABLE TO PERFORM TEST(S). 08/19/2020 1236   Lipase     Component Value Date/Time   LIPASE 18 08/12/2020 1010    Studies/Results: DG Chest 2 View  Result Date: 08/24/2020 CLINICAL DATA:  Fever and abdominal pain. EXAM: CHEST - 2 VIEW COMPARISON:  08/12/2020; CT abdomen pelvis-08/12/2020 FINDINGS: Examination is degraded due to patient body habitus. Grossly unchanged cardiac silhouette and mediastinal contours given reduced lung volumes. Right upper extremity approach PICC line tip projects over the superior cavoatrial junction. Interval development of suspected small left-sided effusion with worsening bibasilar  heterogeneous opacities. Pulmonary venous congestion without frank evidence of edema. No pneumothorax. No acute osseous abnormalities. Degenerative change the right AC joint is suspected though incompletely evaluated. IMPRESSION: 1. Interval development of suspected small left-sided effusion with bibasilar heterogeneous opacities, atelectasis versus infiltrate/aspiration.  Continued attention on follow-up is advised. 2. Right upper extremity approach PICC line tip projects over the superior cavoatrial junction. Electronically Signed   By: Sandi Mariscal M.D.   On: 08/24/2020 14:37   DG ELBOW COMPLETE LEFT (3+VIEW)  Result Date: 08/24/2020 CLINICAL DATA:  History of gout, now with right elbow pain and swelling. No known injury. EXAM: LEFT ELBOW - COMPLETE 3+ VIEW COMPARISON:  None. FINDINGS: Marked soft tissue swelling about the posterior aspect of the elbow without associated fracture or elbow joint effusion. Potential tiny (4 mm) focus of subcutaneous emphysema is seen about the posterolateral aspect of the imaged upper arm. No radiopaque foreign body. No discrete areas of osteolysis to suggest osteomyelitis. IMPRESSION: Marked soft tissue swelling about the posterior aspect of the elbow with potential tiny focus of subcutaneous emphysema about the posterior aspect of the upper arm but without associated fracture, joint effusion or radiographic evidence of osteomyelitis. Clinical correlation is advised. Electronically Signed   By: Sandi Mariscal M.D.   On: 08/24/2020 14:40   DG ELBOW COMPLETE RIGHT (3+VIEW)  Result Date: 08/24/2020 CLINICAL DATA:  Right elbow pain and swelling.  History of gout. EXAM: RIGHT ELBOW - COMPLETE 3+ VIEW COMPARISON:  None. FINDINGS: There is diffuse soft tissue swelling about the posterior aspect of the elbow without associated fracture or elbow joint effusion, though note, the provided lateral radiographs are degraded secondary to obliquity. Joint spaces appear preserved. Minimal enthesopathic change about the medial and lateral epicondyles. No erosions. No radiopaque foreign body. No subcutaneous emphysema. IMPRESSION: 1. Nonspecific diffuse soft tissue swelling about the elbow without associated fracture or radiopaque foreign body. 2. Minimal enthesopathic change about the medial and lateral epicondyles, the sequela of remote avulsive injury.  Electronically Signed   By: Sandi Mariscal M.D.   On: 08/24/2020 14:47   DG Knee Complete 4 Views Right  Result Date: 08/24/2020 CLINICAL DATA:  History of gout, now with right knee pain and swelling. EXAM: RIGHT KNEE - COMPLETE 4+ VIEW COMPARISON:  None. FINDINGS: Small knee joint effusion. No fracture or evidence of lipohemarthrosis. Mild-to-moderate tricompartmental degenerative change of the knee, worse within the patellofemoral joint with joint space loss, subchondral sclerosis, articular surface irregularity and osteophytosis. No evidence of chondrocalcinosis. No radiopaque foreign body. IMPRESSION: 1. Small knee joint effusion.  Otherwise, no acute findings. 2. Mild-to-moderate tricompartmental degenerative change of the knee. Electronically Signed   By: Sandi Mariscal M.D.   On: 08/24/2020 14:42    Anti-infectives: Anti-infectives (From admission, onward)    Start     Dose/Rate Route Frequency Ordered Stop   08/15/20 1000  anidulafungin (ERAXIS) 100 mg in sodium chloride 0.9 % 100 mL IVPB        100 mg 78 mL/hr over 100 Minutes Intravenous Every 24 hours 08/14/20 1349 08/20/20 1326   08/14/20 1600  anidulafungin (ERAXIS) 200 mg in sodium chloride 0.9 % 200 mL IVPB        200 mg 78 mL/hr over 200 Minutes Intravenous  Once 08/14/20 1349 08/14/20 2000   08/14/20 1000  vancomycin (VANCOREADY) IVPB 1500 mg/300 mL  Status:  Discontinued        1,500 mg 150 mL/hr over 120 Minutes Intravenous Every 24 hours 08/13/20 1009 08/14/20  0901   08/13/20 1000  vancomycin (VANCOREADY) IVPB 1250 mg/250 mL  Status:  Discontinued        1,250 mg 166.7 mL/hr over 90 Minutes Intravenous Every 24 hours 08/12/20 1910 08/13/20 1009   08/12/20 2200  piperacillin-tazobactam (ZOSYN) IVPB 3.375 g  Status:  Discontinued        3.375 g 12.5 mL/hr over 240 Minutes Intravenous Every 8 hours 08/12/20 1904 08/12/20 1941   08/12/20 2000  clindamycin (CLEOCIN) IVPB 600 mg  Status:  Discontinued        600 mg 100 mL/hr over  30 Minutes Intravenous Every 8 hours 08/12/20 1757 08/17/20 0844   08/12/20 1830  piperacillin-tazobactam (ZOSYN) IVPB 3.375 g        3.375 g 12.5 mL/hr over 240 Minutes Intravenous Every 8 hours 08/12/20 1811 08/26/20 1829   08/12/20 1015  vancomycin (VANCOREADY) IVPB 1500 mg/300 mL        1,500 mg 150 mL/hr over 120 Minutes Intravenous  Once 08/12/20 1007 08/12/20 1330   08/12/20 1000  ceFEPIme (MAXIPIME) 2 g in sodium chloride 0.9 % 100 mL IVPB        2 g 200 mL/hr over 30 Minutes Intravenous  Once 08/12/20 1000 08/12/20 1121   08/12/20 1000  metroNIDAZOLE (FLAGYL) IVPB 500 mg        500 mg 100 mL/hr over 60 Minutes Intravenous  Once 08/12/20 1000 08/12/20 1226   08/12/20 1000  vancomycin (VANCOCIN) IVPB 1000 mg/200 mL premix  Status:  Discontinued        1,000 mg 200 mL/hr over 60 Minutes Intravenous  Once 08/12/20 1000 08/12/20 1007        Assessment/Plan POD 14 s/p drainage and debridement of LLQ abdominal wall 15 x 20 cm, partial colectomy with end colostomy, Splenic mobilization by Dr. Kieth Brightly on 08/12/20 for abdominal wall abscess, diverticulitis POD 12 s/p reopening of recent laparotomy, lysis of adhesions for 45 min. Small bowel resection with anastomosis, enterrorhaphy x 2, debridement of 200 cm^2 of necrotic fat and fascia of abdominal wall by Dr. Kieth Brightly on 08/14/20 for intestinal injury POD 11 s/p Debridement of abdominal wall with above dimensions with sharp dissection and Pulsavac of wounds by Dr. Brantley Stage on 7/16 for Open abdominal wall wounds left flank, right lower quadrant total area 738 cm ^2  - The area left lower quadrants was 20 cm x 30 cm and midline 20 cm x 6 cm and right lower quadrant 6 x 3 cm - Path 7/13 w/ diverticulitis - no evidence of malignancy  - On soft diet. Nutrition following. - Colostomy functioning - Cont wound care. BID WTD. Monitor LLQ wound where I had to bluntly opened false bottom 7/21 and 7/25. Make sure this area is packed. - WOCN  following. - Cont abx. Comp 7d Eraxis, on Zosyn for 14d total. Consider stopping tomorrow - Cont drain. SS - Mobilize. PT/OT rec CIR - Pulm toilet   FEN - Soft diet. BID PPI. Appreciate TRH assistance w/ fluid and electrolyte balance VTE - SCDs, hold therapeutic Lovenox ID - Currently on Zosyn. Monitor fever curve. WBC pending Foley - Out, voiding Dispo - CIR following   Hyponatremia - am labs pending. RUE/RLE DVT - Hold Lovenox today given continued ABL anemia in the setting of melanotic stools requiring multiple units of PRBC.  ABL anemia - Hgb 5.6 > 2U PRBC > 8.1 > 5.7 > 2U PRBC > 7.1 > 6.4 7/26 > 2U of PRBC ordered. Getting 2U now. Await  post transfusion CBC.  Hold Lovenox. Hyperglycemia - stable. SSI Protein Calorie Malnutrition - Pre-alb < 5 -> 11.4. TPN off, on diet. Nutrition following for supplements.  L elbow, R elbow, and R knee pain/swelling - Appreciate Dr. Aurea Graff assistance. S/p aspiration of R knee. On Toradol for suspected gout. Defer interpretation of fluid from aspiration and management to Ortho   LOS: 14 days    Jillyn Ledger , [facility] 08/26/2020, 8:26 AM Please see Amion for pager number during day hours 7:00am-4:30pm

## 2020-08-27 DIAGNOSIS — R1030 Lower abdominal pain, unspecified: Secondary | ICD-10-CM | POA: Diagnosis not present

## 2020-08-27 DIAGNOSIS — K631 Perforation of intestine (nontraumatic): Secondary | ICD-10-CM | POA: Diagnosis not present

## 2020-08-27 DIAGNOSIS — Z4659 Encounter for fitting and adjustment of other gastrointestinal appliance and device: Secondary | ICD-10-CM | POA: Diagnosis not present

## 2020-08-27 LAB — TYPE AND SCREEN
ABO/RH(D): O POS
Antibody Screen: NEGATIVE
Unit division: 0

## 2020-08-27 LAB — BASIC METABOLIC PANEL
Anion gap: 6 (ref 5–15)
BUN: 15 mg/dL (ref 6–20)
CO2: 24 mmol/L (ref 22–32)
Calcium: 7.9 mg/dL — ABNORMAL LOW (ref 8.9–10.3)
Chloride: 98 mmol/L (ref 98–111)
Creatinine, Ser: 0.61 mg/dL (ref 0.61–1.24)
GFR, Estimated: >60 mL/min (ref >60)
Glucose, Bld: 186 mg/dL — ABNORMAL HIGH (ref 70–99)
Potassium: 3.5 mmol/L (ref 3.5–5.1)
Sodium: 128 mmol/L — ABNORMAL LOW (ref 135–145)

## 2020-08-27 LAB — BPAM RBC
Blood Product Expiration Date: 202208272359
ISSUE DATE / TIME: 202207270617
Unit Type and Rh: 5100

## 2020-08-27 LAB — CBC
HCT: 25.3 % — ABNORMAL LOW (ref 39.0–52.0)
Hemoglobin: 8.4 g/dL — ABNORMAL LOW (ref 13.0–17.0)
MCH: 30.5 pg (ref 26.0–34.0)
MCHC: 33.2 g/dL (ref 30.0–36.0)
MCV: 92 fL (ref 80.0–100.0)
Platelets: 457 10*3/uL — ABNORMAL HIGH (ref 150–400)
RBC: 2.75 MIL/uL — ABNORMAL LOW (ref 4.22–5.81)
RDW: 15.6 % — ABNORMAL HIGH (ref 11.5–15.5)
WBC: 13.7 10*3/uL — ABNORMAL HIGH (ref 4.0–10.5)
nRBC: 0 % (ref 0.0–0.2)

## 2020-08-27 LAB — OSMOLALITY: Osmolality: 275 mOsm/kg (ref 275–295)

## 2020-08-27 LAB — OSMOLALITY, URINE: Osmolality, Ur: 410 mOsm/kg (ref 300–900)

## 2020-08-27 LAB — GLUCOSE, CAPILLARY
Glucose-Capillary: 157 mg/dL — ABNORMAL HIGH (ref 70–99)
Glucose-Capillary: 226 mg/dL — ABNORMAL HIGH (ref 70–99)
Glucose-Capillary: 160 mg/dL — ABNORMAL HIGH (ref 70–99)

## 2020-08-27 MED ORDER — FUROSEMIDE 10 MG/ML IJ SOLN
40.0000 mg | Freq: Once | INTRAMUSCULAR | Status: AC
  Administered 2020-08-27: 40 mg via INTRAVENOUS
  Filled 2020-08-27: qty 4

## 2020-08-27 MED ORDER — KETOROLAC TROMETHAMINE 15 MG/ML IJ SOLN
7.5000 mg | Freq: Four times a day (QID) | INTRAMUSCULAR | Status: AC
  Administered 2020-08-27 – 2020-08-28 (×6): 7.5 mg via INTRAVENOUS
  Filled 2020-08-27 (×6): qty 1

## 2020-08-27 MED ORDER — ENOXAPARIN SODIUM 80 MG/0.8ML IJ SOSY
80.0000 mg | PREFILLED_SYRINGE | Freq: Two times a day (BID) | INTRAMUSCULAR | Status: DC
  Administered 2020-08-27 – 2020-08-31 (×8): 80 mg via SUBCUTANEOUS
  Filled 2020-08-27 (×8): qty 0.8

## 2020-08-27 NOTE — Progress Notes (Signed)
[place] for enoxaparin Indication: DVT  No Known Allergies  Patient Measurements: Height: '5\' 10"'$  (177.8 cm) Weight: 79.8 kg (175 lb 14.8 oz) IBW/kg (Calculated) : 73  Vital Signs: Temp: 98.5 F (36.9 C) (07/28 1418) Temp Source: Oral (07/28 1418) BP: 123/73 (07/28 1418) Pulse Rate: 95 (07/28 1418)  Labs: Recent Labs    08/25/20 0613 08/26/20 1206 08/27/20 0630  HGB 6.4* 8.5* 8.4*  HCT 19.5* 25.4* 25.3*  PLT 472* 417* 457*  CREATININE 0.68 0.79 0.61   Assessment: 60 yo M w/ DVT. Had been holding anticoagulation due to ABL anemia. Now restarting today. Hgb 8.4, plts 457.   Plan:  Restart enoxaparin '80mg'$  [place] Q12h Monitor CBC, s/s of bleed  Elenor Quinones, PharmD, BCPS, BCIDP Clinical Pharmacist 08/27/2020 4:14 PM

## 2020-08-27 NOTE — TOC Progression Note (Signed)
Transition of Care [facility]) - Progression Note    Patient Details  Name: Elry Sluyter MRN: [id number] Date of Birth: 03/03/1960  [facility] CM/SW Contact  Chicquita Mendel, Marjie Skiff, RN Phone Number: 08/27/2020, 11:44 AM  Clinical Narrative:    Contacted [facility] liaison today to touch base about pt still going to CIR at dc. Per [facility] pt needs to work with therapy today for them to assess him again for CIR. They can then start auth with [employer] if still eligible. TOC will continue to follow.   Expected Discharge Plan: Home/Self Care Barriers to Discharge: No [facility] will accept this patient  Expected Discharge Plan and Services Expected Discharge Plan: Home/Self Care   Discharge Planning Services: CM Consult   Living arrangements for the past 2 months: Single Family Home                     Social Determinants of Health (SDOH) Interventions    Readmission Risk Interventions No flowsheet data found.

## 2020-08-27 NOTE — Progress Notes (Signed)
[facility] rehab admissions - awaiting updated PT/OT notes showing increased participation before opening the insurance case with [employer].  No updated notes yet from today.  Call for questions.  [phone]

## 2020-08-27 NOTE — Progress Notes (Signed)
12 Days Post-Op  Subjective: CC: Having lots of pain in his right knee after working with therapies yesterday. B/l elbows improved. No abdominal pain, n/v. Tolerating diet. Having bowel function. Stool less dark. Voiding.   Objective: Vital signs in last 24 hours: Temp:  [97.4 F (36.3 C)-99 F (37.2 C)] 98.4 F (36.9 C) (07/28 0452) Pulse Rate:  [90-110] 97 (07/28 0452) Resp:  [15-19] 18 (07/28 0452) BP: (120-140)/(74-98) 135/90 (07/28 0452) SpO2:  [99 %-100 %] 100 % (07/28 0452) Last BM Date: 08/26/20  Intake/Output from previous day: 07/27 0701 - 07/28 0700 In: 1292.3 [P.O.:360; I.V.:142.4; Blood:315; IV Piggyback:470] Out: 2875 [Urine:2525; Stool:350] Intake/Output this shift: No intake/output data recorded.  PE: Gen:  Alert, NAD, pleasant HEENT: EOM's intact, pupils equal and round Card: RRR  Pulm:  CTAB, no W/R/R, effort normal. On RA Abd: Soft, ND, appropriately tender around wounds, +BS, colostomy bag with black/more brown stool. Unable to visualize colostomy 2/2 stool. Drain SS. Wounds stable. LLQ wound with healthy granulation tissue at the base. Midline wound with mixed granulation and fatty tissue without dehiscence or evisceration. The inferior aspect of the midline wound tunnels and connects to the LLQ wound. RLQ wound with healthy grannulation tissue at the base. No drainage. No periwound cellulitis.  GU: No foley Ext: Left elbow and right elbow w/ inc rom from yesterday. Right knee swelling and tenderness. Radial and DP 2+.   Psych: A&Ox3 Skin: no rashes noted, warm and dry  Lab Results:  Recent Labs    08/26/20 1206 08/27/20 0630  WBC 12.7* 13.7*  HGB 8.5* 8.4*  HCT 25.4* 25.3*  PLT 417* 457*   BMET Recent Labs    08/26/20 1206 08/27/20 0630  NA 129* 128*  K 3.9 3.5  CL 98 98  CO2 23 24  GLUCOSE 293* 186*  BUN 18 15  CREATININE 0.79 0.61  CALCIUM 7.8* 7.9*   PT/INR No results for input(s): LABPROT, INR in the last 72 hours. CMP      Component Value Date/Time   NA 128 (L) 08/27/2020 0630   K 3.5 08/27/2020 0630   CL 98 08/27/2020 0630   CO2 24 08/27/2020 0630   GLUCOSE 186 (H) 08/27/2020 0630   BUN 15 08/27/2020 0630   CREATININE 0.61 08/27/2020 0630   CALCIUM 7.9 (L) 08/27/2020 0630   PROT SPECIMEN CONTAMINATED, UNABLE TO PERFORM TEST(S). 08/19/2020 1236   ALBUMIN 1.7 (L) 08/26/2020 1206   AST SPECIMEN CONTAMINATED, UNABLE TO PERFORM TEST(S). 08/19/2020 1236   ALT SPECIMEN CONTAMINATED, UNABLE TO PERFORM TEST(S). 08/19/2020 1236   ALKPHOS SPECIMEN CONTAMINATED, UNABLE TO PERFORM TEST(S). 08/19/2020 1236   BILITOT SPECIMEN CONTAMINATED, UNABLE TO PERFORM TEST(S). 08/19/2020 1236   GFRNONAA >60 08/27/2020 0630   GFRAA SPECIMEN CONTAMINATED, UNABLE TO PERFORM TEST(S). 08/19/2020 1236   Lipase     Component Value Date/Time   LIPASE 18 08/12/2020 1010    Studies/Results: No results found.  Anti-infectives: Anti-infectives (From admission, onward)    Start     Dose/Rate Route Frequency Ordered Stop   08/15/20 1000  anidulafungin (ERAXIS) 100 mg in sodium chloride 0.9 % 100 mL IVPB        100 mg 78 mL/hr over 100 Minutes Intravenous Every 24 hours 08/14/20 1349 08/20/20 1326   08/14/20 1600  anidulafungin (ERAXIS) 200 mg in sodium chloride 0.9 % 200 mL IVPB        200 mg 78 mL/hr over 200 Minutes Intravenous  Once 08/14/20 1349 08/14/20 2000  08/14/20 1000  vancomycin (VANCOREADY) IVPB 1500 mg/300 mL  Status:  Discontinued        1,500 mg 150 mL/hr over 120 Minutes Intravenous Every 24 hours 08/13/20 1009 08/14/20 0901   08/13/20 1000  vancomycin (VANCOREADY) IVPB 1250 mg/250 mL  Status:  Discontinued        1,250 mg 166.7 mL/hr over 90 Minutes Intravenous Every 24 hours 08/12/20 1910 08/13/20 1009   08/12/20 2200  piperacillin-tazobactam (ZOSYN) IVPB 3.375 g  Status:  Discontinued        3.375 g 12.5 mL/hr over 240 Minutes Intravenous Every 8 hours 08/12/20 1904 08/12/20 1941   08/12/20 2000   clindamycin (CLEOCIN) IVPB 600 mg  Status:  Discontinued        600 mg 100 mL/hr over 30 Minutes Intravenous Every 8 hours 08/12/20 1757 08/17/20 0844   08/12/20 1830  piperacillin-tazobactam (ZOSYN) IVPB 3.375 g        3.375 g 12.5 mL/hr over 240 Minutes Intravenous Every 8 hours 08/12/20 1811 08/26/20 1829   08/12/20 1015  vancomycin (VANCOREADY) IVPB 1500 mg/300 mL        1,500 mg 150 mL/hr over 120 Minutes Intravenous  Once 08/12/20 1007 08/12/20 1330   08/12/20 1000  ceFEPIme (MAXIPIME) 2 g in sodium chloride 0.9 % 100 mL IVPB        2 g 200 mL/hr over 30 Minutes Intravenous  Once 08/12/20 1000 08/12/20 1121   08/12/20 1000  metroNIDAZOLE (FLAGYL) IVPB 500 mg        500 mg 100 mL/hr over 60 Minutes Intravenous  Once 08/12/20 1000 08/12/20 1226   08/12/20 1000  vancomycin (VANCOCIN) IVPB 1000 mg/200 mL premix  Status:  Discontinued        1,000 mg 200 mL/hr over 60 Minutes Intravenous  Once 08/12/20 1000 08/12/20 1007        Assessment/Plan POD 15 s/p drainage and debridement of LLQ abdominal wall 15 x 20 cm, partial colectomy with end colostomy, Splenic mobilization by Dr. Kieth Brightly on 08/12/20 for abdominal wall abscess, diverticulitis POD 13 s/p reopening of recent laparotomy, lysis of adhesions for 45 min. Small bowel resection with anastomosis, enterrorhaphy x 2, debridement of 200 cm^2 of necrotic fat and fascia of abdominal wall by Dr. Kieth Brightly on 08/14/20 for intestinal injury POD 12 s/p Debridement of abdominal wall with above dimensions with sharp dissection and Pulsavac of wounds by Dr. Brantley Stage on 7/16 for Open abdominal wall wounds left flank, right lower quadrant total area 738 cm ^2  - The area left lower quadrants was 20 cm x 30 cm and midline 20 cm x 6 cm and right lower quadrant 6 x 3 cm - Path 7/13 w/ diverticulitis - no evidence of malignancy  - On soft diet. Nutrition following. - Colostomy functioning - Cont wound care. BID WTD. Monitor LLQ wound where I had to  bluntly opened false bottom 7/21 and 7/25. Make sure this area is packed. - WOCN following. - Cont abx. Comp 7d Eraxis, on Zosyn and completed 14d. Will consider stopping - discuss with MD. - Cont drain. SS - Mobilize. PT/OT rec CIR - Pulm toilet   FEN - Soft diet. BID PPI. Appreciate TRH assistance w/ fluid and electrolyte balance VTE - SCDs, hold therapeutic Lovenox ID - Currently on Zosyn. Monitor fever curve. WBC pending Foley - Out, voiding Dispo - CIR following   Hyponatremia - appreciate TRH assistance RUE/RLE DVT - Holding Lovenox given ABL anemia in the setting of melanotic  stools requiring multiple units of PRBC. Consider restarting today vs tomorrow ABL anemia - Hgb 5.6 > 2U PRBC > 8.1 > 5.7 > 2U PRBC > 7.1 > 6.4 7/26 > 2U PRBC > 8.5 > 8.4 and stable . Hyperglycemia - stable. SSI Protein Calorie Malnutrition - Pre-alb < 5 -> 11.4. TPN off, on diet. Nutrition following for supplements.  L elbow, R elbow, and R knee pain/swelling - Appreciate Dr. Aurea Graff assistance. S/p aspiration of R knee. On Toradol. Defer interpretation of fluid from aspiration and management to Ortho   LOS: 15 days    Jillyn Ledger , [facility] 08/27/2020, 9:22 AM Please see Amion for pager number during day hours 7:00am-4:30pm

## 2020-08-27 NOTE — Progress Notes (Signed)
Physical Therapy Treatment Patient Details Name: Roger Brennan MRN: [id number] DOB: 02/16/1960 Today's Date: 08/27/2020    History of Present Illness Patient is 60 yo  old male who presented to [facility]/12/20 at the direction of PCP with LLQ abdominal pain and induration for the last 2-3 weeks, 20 pound weight loss.  CT-Colon perforation with abscess and fistulization to abdominal wall .  S/P drainage and debridement of left lower quadrant abdominal wall ,. partial colectomy with end colostomy , Splenic mobilization on 08/12/20 emergently. 7/15 intestinal drainage from the wound >> emergent exploration; recurrent peritonitis, LOA ,Small bowel resection with anastomosis, enterrorhaphy x 2, debridement of 200 cm^2 of necrotic fat and fascia of abdominal wall; 7/16 debridement of abdominal wall    PT Comments    Pt was significantly limited by increased pain in B knees R>L requiring +2 assist for all mobility. Pt states that he received medication today that has worked for pain before and is hopeful it will assist with pain control. PT educated pt on performance of therapeutic interventions for circulation, pt verbalized and demonstrated understanding. Pt will benefit from continued skilled PT in order to address listed impairments and maximize functional mobility.  Follow Up Recommendations  CIR     Equipment Recommendations  Rolling walker with 5" wheels    Recommendations for Other Services       Precautions / Restrictions Precautions Precautions: Fall Precaution Comments: colostomy, R JP drain, bandages over full abdomen, Restrictions Weight Bearing Restrictions: No    Mobility  Bed Mobility Overal bed mobility: Needs Assistance Bed Mobility: Supine to Sit;Sit to Supine     Supine to sit: Mod assist;+2 for physical assistance;+2 for safety/equipment;HOB elevated Sit to supine: Mod assist;+2 for physical assistance;+2 for safety/equipment;HOB elevated (+3 pt's wife assisting with  sit to supine transfer)   General bed mobility comments: Pt required MOD A+2 for progression of Rt LE and to bring trunk to upright, use of bed rails and chuck pad to assist. Pt unable to tolerate Rt knee flexion, PT providing manual support to keep Rt knee extended during transfer. Pt's Rt knee supported on therapists knee once EOB, Rt LE eventually rested on chair to assist with decreased knee flexion for pain control. Pt required +3 assist for sit to supine transfer, assist for B LE progression onto bed and assist at post shoulders for balance. Pt requiring increased time for all mobility.    Transfers                 General transfer comment: Pt with significant pain, transfers deferred this session  Ambulation/Gait                 Stairs             Wheelchair Mobility    Modified Rankin (Stroke Patients Only)       Balance Overall balance assessment: Needs assistance Sitting-balance support: Feet supported;Single extremity supported Sitting balance-Leahy Scale: Poor Sitting balance - Comments: pt with use of single UE support on bedrail and requiring MIN A posteriorly due to abdominal discomfort                                    Cognition Arousal/Alertness: Awake/alert Behavior During Therapy: WFL for tasks assessed/performed Overall Cognitive Status: Within Functional Limits for tasks assessed  Exercises General Exercises - Lower Extremity Ankle Circles/Pumps: AROM;Both;20 reps;Seated Quad Sets: AROM;Right;10 reps;Seated    General Comments General comments (skin integrity, edema, etc.): edema observed Rt knee and B elbows, pt with limited ROM in B UE/LEs due to pain. MD present during session and aware.      Pertinent Vitals/Pain Pain Assessment: Faces Faces Pain Scale: Hurts whole lot Pain Location: R knee Pain Descriptors / Indicators: Discomfort;Grimacing;Guarding Pain  Intervention(s): Limited activity within patient's tolerance;Monitored during session;Repositioned    Home Living                      Prior Function            PT Goals (current goals can now be found in the care plan section) Acute Rehab PT Goals Patient Stated Goal: return to baseline PT Goal Formulation: With patient Time For Goal Achievement: 08/27/20 Potential to Achieve Goals: Good Progress towards PT goals: Progressing toward goals    Frequency    Min 3X/week      PT Plan Current plan remains appropriate    Co-evaluation              AM-PAC PT "6 Clicks" Mobility   Outcome Measure  Help needed turning from your back to your side while in a flat bed without using bedrails?: A Lot Help needed moving from lying on your back to sitting on the side of a flat bed without using bedrails?: Total Help needed moving to and from a bed to a chair (including a wheelchair)?: Total Help needed standing up from a chair using your arms (e.g., wheelchair or bedside chair)?: Total Help needed to walk in hospital room?: Total Help needed climbing 3-5 steps with a railing? : Total 6 Click Score: 7    End of Session   Activity Tolerance: Patient limited by pain Patient left: in bed;with call bell/phone within reach;with family/visitor present Nurse Communication: Mobility status PT Visit Diagnosis: Unsteadiness on feet (R26.81);Difficulty in walking, not elsewhere classified (R26.2);Muscle weakness (generalized) (M62.81);Pain Pain - Right/Left: Right Pain - part of body: Knee     Time: [id number] PT Time Calculation (min) (ACUTE ONLY): 48 min  Charges:  $Therapeutic Exercise: 8-22 mins $Therapeutic Activity: 23-37 mins                     Festus Barren PT, DPT  Acute Rehabilitation Services  Office [phone]    08/27/2020, 4:26 PM

## 2020-08-27 NOTE — Progress Notes (Signed)
Per Rollene Fare, Asst Dir, 66f does their own blood draws off CL's.  She will handle the blood draw for pt.

## 2020-08-27 NOTE — Progress Notes (Signed)
Patient ID: Roger Brennan, male   DOB: Mar 15, 1960, 60 y.o.   MRN: [id number]  Patient was seen and evaluated yesterday in follow up to hi polyarthralgia (right knee and bilateral elbow pain)  He had received I believe close to 4 doses of Toradol  He was feeling a lot better noting improvement in the pain in those joints  On exam: He was noted to have significant more motion without nearly the pain Some noted stiffness in right knee still  Aspiration results revealed no evidence of infectious process and no crystals identified  Etiology of acute polyarthralgia still unknown If able would empirically treat as inflammatory condition with Toradol as otherwise would treat with prednisone as outpatient (but likely not indicated due to healing concerns for abdomen)

## 2020-08-27 NOTE — Progress Notes (Signed)
Nutrition Follow-up  INTERVENTION:   -Boost Breeze po BID, each supplement provides 250 kcal and 9 grams of protein  -Ensure Enlive po BID, each supplement provides 350 kcal and 20 grams of protein  -Juven Fruit Punch BID, each serving provides 95kcal and 2.5g of protein (amino acids glutamine and arginine)   NUTRITION DIAGNOSIS:   Inadequate oral intake related to acute illness, decreased appetite as evidenced by meal completion < 50%.  Ongoing.  GOAL:   Patient will meet greater than or equal to 90% of their needs  Progressing.  MONITOR:   PO intake, Supplement acceptance, Labs, Weight trends  ASSESSMENT:   60 year old male with no known medical history. He presented to the ED at the direction of his PCP d/t LLQ abdominal pain and abdominal wound x2-3 weeks. Wound started as a small area, about the size of a silver dollar, and has progressively expanded and worsened. He reported decreased appetite and intermittent fevers over the last several weeks. Last BM was the day PTA. He started having issues with constipation in early June and he made diet adjustments with fiber supplementation. His wife reported 20 lb weight loss in the past 6 weeks.  Significant Events: 7/13- admission with dx of abdominal wall abscess, necrotizing soft tissue of abdominal wall, perforated diverticulitis with colocutaneous fistula; I&D LLQ abdominal wall, partial colectomy and end colostomy, and splenic mobilization; NGT placement 7/15- initial RD assessment; exploration for concern of leak and inspection of L flank, LOAs, SBR with anastomosis, enterorrhaphy x2, debridement of necrotic fat and fascia of abdominal wall 7/16- placement of double lumen PICC in R basilic; debridement of abdominal wall with sharp dissection and pulsavac of wounds 7/18- NGT removal; diet advanced to CLD 7/19- diet advanced to FLD 7/20- diet advanced to Soft 7/21- TPN stopped  Patient consuming 50-100% of meals and  accepting most supplements.  Admission weight: 163 lbs. Current weight: 175 lbs.  Medications: Multivitamin with minerals daily   Labs reviewed: CBGs: 157- 231 Low Na  Diet Order:   Diet Order             DIET SOFT Room service appropriate? Yes; Fluid consistency: Thin  Diet effective now                   EDUCATION NEEDS:   Not appropriate for education at this time  Skin:  Skin Assessment: Skin Integrity Issues: Skin Integrity Issues:: Incisions, Other (Comment) Incisions: abdomen (7/13, 7/15, 7/16) Other: open wounds to R, L, and mid-line abdomen--doing wet-to-dry dressing changes with no plans for return to OR for closure  Last BM:  7/22 (400 ml via colostomy)  Height:   Ht Readings from Last 1 Encounters:  08/12/20 '5\' 10"'$  (1.778 m)    Weight:   Wt Readings from Last 1 Encounters:  08/21/20 79.8 kg    Ideal Body Weight:  75.4 kg  BMI:  Body mass index is 25.24 kg/m.  Estimated Nutritional Needs:   Kcal:  2500-2700 kcal  Protein:  140-155 grams  Fluid:  >/= 2.5 L/day   Clayton Bibles, MS, RD, LDN Inpatient Clinical Dietitian Contact information available via Amion

## 2020-08-27 NOTE — Progress Notes (Signed)
PROGRESS NOTE    Roger Brennan  [id number] DOB: 01/13/1961 DOA: 08/12/2020 PCP: [name], MD    Brief Narrative:  60 y.o. male with no significant past medical history. Presenting with abdominal pain. He was admitted to the surgical service for diverticulitis w/ perforation. He had a partial colectomy and colostomy placement. He developed an abdominal wall infection requiring debridement. His stay has been complicated by DVTs and acute blood loss anemia.    Over the last several days, the patient has developed hyponatremia. It was noted last night that his sodium was 124. This morning it is 128. Since his the beginning of his admission, he is 4.7L fluid positive per documentation. TRH was consulted for concern of fluid status and hyponatremia.  Assessment & Plan:   Active Problems:   Perforation of colon (HCC)   Severe sepsis (HCC)   Pressure injury of skin  Hyponatremia     -Sodium improved with IVF hydration      -sodium 128 this AM    - no current neurological symptoms   - Serum osm borderline low at 275, urine osm borderline low 410   - Recommend trial of fluid restriction    - repeat bmet in AM   Diverticulitis w/ perforation Abdominal wall infection s/p ex lap/debridements RUE/RLE DVT Acute blood loss anemia Hyperlgycemia Polyarticular pain/swelling     - Orthopedic surgery following. Marked pain this afternoon  Antimicrobials: Anti-infectives (From admission, onward)    Start     Dose/Rate Route Frequency Ordered Stop   08/15/20 1000  anidulafungin (ERAXIS) 100 mg in sodium chloride 0.9 % 100 mL IVPB        100 mg 78 mL/hr over 100 Minutes Intravenous Every 24 hours 08/14/20 1349 08/20/20 1326   08/14/20 1600  anidulafungin (ERAXIS) 200 mg in sodium chloride 0.9 % 200 mL IVPB        200 mg 78 mL/hr over 200 Minutes Intravenous  Once 08/14/20 1349 08/14/20 2000   08/14/20 1000  vancomycin (VANCOREADY) IVPB 1500 mg/300 mL  Status:  Discontinued         1,500 mg 150 mL/hr over 120 Minutes Intravenous Every 24 hours 08/13/20 1009 08/14/20 0901   08/13/20 1000  vancomycin (VANCOREADY) IVPB 1250 mg/250 mL  Status:  Discontinued        1,250 mg 166.7 mL/hr over 90 Minutes Intravenous Every 24 hours 08/12/20 1910 08/13/20 1009   08/12/20 2200  piperacillin-tazobactam (ZOSYN) IVPB 3.375 g  Status:  Discontinued        3.375 g 12.5 mL/hr over 240 Minutes Intravenous Every 8 hours 08/12/20 1904 08/12/20 1941   08/12/20 2000  clindamycin (CLEOCIN) IVPB 600 mg  Status:  Discontinued        600 mg 100 mL/hr over 30 Minutes Intravenous Every 8 hours 08/12/20 1757 08/17/20 0844   08/12/20 1830  piperacillin-tazobactam (ZOSYN) IVPB 3.375 g        3.375 g 12.5 mL/hr over 240 Minutes Intravenous Every 8 hours 08/12/20 1811 08/26/20 1829   08/12/20 1015  vancomycin (VANCOREADY) IVPB 1500 mg/300 mL        1,500 mg 150 mL/hr over 120 Minutes Intravenous  Once 08/12/20 1007 08/12/20 1330   08/12/20 1000  ceFEPIme (MAXIPIME) 2 g in sodium chloride 0.9 % 100 mL IVPB        2 g 200 mL/hr over 30 Minutes Intravenous  Once 08/12/20 1000 08/12/20 1121   08/12/20 1000  metroNIDAZOLE (FLAGYL) IVPB 500 mg  500 mg 100 mL/hr over 60 Minutes Intravenous  Once 08/12/20 1000 08/12/20 1226   08/12/20 1000  vancomycin (VANCOCIN) IVPB 1000 mg/200 mL premix  Status:  Discontinued        1,000 mg 200 mL/hr over 60 Minutes Intravenous  Once 08/12/20 1000 08/12/20 1007       Subjective: Complains of marked joint pains this afternoon  Objective: Vitals:   08/26/20 1435 08/26/20 2016 08/27/20 0452 08/27/20 1418  BP: (!) 131/98 140/81 135/90 123/73  Pulse: 96 (!) 110 97 95  Resp: '15 18 18 16  '$ Temp: 98.2 F (36.8 C) 99 F (37.2 C) 98.4 F (36.9 C) 98.5 F (36.9 C)  TempSrc: Oral Oral Oral Oral  SpO2: 99% 100% 100% 98%  Weight:      Height:        Intake/Output Summary (Last 24 hours) at 08/27/2020 1652 Last data filed at 08/27/2020 1300 Gross per 24 hour   Intake 675.05 ml  Output 2825 ml  Net -2149.95 ml    Filed Weights   08/14/20 1420 08/15/20 0445 08/21/20 1336  Weight: 83.8 kg 86.1 kg 79.8 kg    Examination: General exam: Conversant, in no acute distress Respiratory system: normal chest rise, clear, no audible wheezing Cardiovascular system: regular rhythm, s1-s2 Gastrointestinal system: Nondistended, nontender, pos BS Central nervous system: No seizures, no tremors Extremities: No cyanosis, R knee swollen Skin: No rashes, no pallor Psychiatry: Affect normal // no auditory hallucinations   Data Reviewed: I have personally reviewed following labs and imaging studies  CBC: Recent Labs  Lab 08/24/20 0754 08/24/20 2113 08/25/20 0613 08/26/20 1206 08/27/20 0630  WBC 16.7* 19.1* 19.1* 12.7* 13.7*  HGB 5.7* 7.1* 6.4* 8.5* 8.4*  HCT 17.5* 21.4* 19.5* 25.4* 25.3*  MCV 95.1 93.9 94.2 92.4 92.0  PLT 398 450* 472* 417* 457*    Basic Metabolic Panel: Recent Labs  Lab 08/24/20 2113 08/25/20 0613 08/26/20 1206 08/27/20 0630  NA 124* 128* 129* 128*  K 3.6 3.8 3.9 3.5  CL 94* 97* 98 98  CO2 '23 24 23 24  '$ GLUCOSE 186* 190* 293* 186*  BUN '17 14 18 15  '$ CREATININE 0.69 0.68 0.79 0.61  CALCIUM 7.5* 7.9* 7.8* 7.9*  PHOS  --   --  3.6  --     GFR: Estimated Creatinine Clearance: 101.4 mL/min (by C-G formula based on SCr of 0.61 mg/dL). Liver Function Tests: Recent Labs  Lab 08/26/20 1206  ALBUMIN 1.7*    No results for input(s): LIPASE, AMYLASE in the last 168 hours. No results for input(s): AMMONIA in the last 168 hours. Coagulation Profile: No results for input(s): INR, PROTIME in the last 168 hours. Cardiac Enzymes: No results for input(s): CKTOTAL, CKMB, CKMBINDEX, TROPONINI in the last 168 hours. BNP (last 3 results) No results for input(s): PROBNP in the last 8760 hours. HbA1C: No results for input(s): HGBA1C in the last 72 hours. CBG: Recent Labs  Lab 08/26/20 0735 08/26/20 1207 08/26/20 1624  08/27/20 0725 08/27/20 1213  GLUCAP 159* 231* 211* 157* 226*    Lipid Profile: No results for input(s): CHOL, HDL, LDLCALC, TRIG, CHOLHDL, LDLDIRECT in the last 72 hours. Thyroid Function Tests: No results for input(s): TSH, T4TOTAL, FREET4, T3FREE, THYROIDAB in the last 72 hours. Anemia Panel: No results for input(s): VITAMINB12, FOLATE, FERRITIN, TIBC, IRON, RETICCTPCT in the last 72 hours. Sepsis Labs: No results for input(s): PROCALCITON, LATICACIDVEN in the last 168 hours.  Recent Results (from the past 240 hour(s))  Body  fluid culture w Gram Stain     Status: None (Preliminary result)   Collection Time: 08/25/20  3:08 PM   Specimen: KNEE; Synovial Fluid  Result Value Ref Range Status   Specimen Description   Final    KNEE RIGHT Performed at [facility] [address] [place], [place] [address]    Special Requests NONE  Final   Gram Stain   Final    ABUNDANT WBC PRESENT, PREDOMINANTLY PMN NO ORGANISMS SEEN    Culture   Final    NO GROWTH 2 DAYS Performed at [facility], [place] [address] [place], [place] [address]    Report Status PENDING  Incomplete      Radiology Studies: No results found.  Scheduled Meds:  Chlorhexidine Gluconate Cloth  6 each Topical Daily   enoxaparin (LOVENOX) injection  80 mg Subcutaneous Q12H   feeding supplement  1 Container Oral BID BM   feeding supplement  237 mL Oral BID BM   furosemide  40 mg Intravenous Once   insulin aspart  0-15 Units Subcutaneous TID WC   ketorolac  7.5 mg Intravenous Q6H   mouth rinse  15 mL Mouth Rinse BID   methocarbamol  1,000 mg Oral TID   multivitamin with minerals  1 tablet Oral Daily   nutrition supplement (JUVEN)  1 packet Oral BID BM   pantoprazole (PROTONIX) IV  40 mg Intravenous Q12H   sodium chloride flush  10-40 mL Intracatheter Q12H   Continuous Infusions:     LOS: 15 days   Marylu Lund, MD [facility] Pager On Amion  If 7PM-7AM, please contact  night-coverage 08/27/2020, 4:52 PM

## 2020-08-27 NOTE — Consult Note (Signed)
WOC in to assess ostomy pouch. Intact Patient and SO agree with plan to change in the am in order to have fresh pouch for the weekend.   Supplies in the patient room   Thanks  [place] MSN, RN,CWOCN, CNS, [place] [phone])

## 2020-08-28 DIAGNOSIS — K631 Perforation of intestine (nontraumatic): Secondary | ICD-10-CM | POA: Diagnosis not present

## 2020-08-28 DIAGNOSIS — R1030 Lower abdominal pain, unspecified: Secondary | ICD-10-CM | POA: Diagnosis not present

## 2020-08-28 LAB — COMPREHENSIVE METABOLIC PANEL
ALT: 44 U/L (ref 0–44)
AST: 29 U/L (ref 15–41)
Albumin: 1.7 g/dL — ABNORMAL LOW (ref 3.5–5.0)
Alkaline Phosphatase: 290 U/L — ABNORMAL HIGH (ref 38–126)
Anion gap: 8 (ref 5–15)
BUN: 18 mg/dL (ref 6–20)
CO2: 24 mmol/L (ref 22–32)
Calcium: 8.2 mg/dL — ABNORMAL LOW (ref 8.9–10.3)
Chloride: 98 mmol/L (ref 98–111)
Creatinine, Ser: 0.66 mg/dL (ref 0.61–1.24)
GFR, Estimated: >60 mL/min (ref >60)
Glucose, Bld: 160 mg/dL — ABNORMAL HIGH (ref 70–99)
Potassium: 3.6 mmol/L (ref 3.5–5.1)
Sodium: 130 mmol/L — ABNORMAL LOW (ref 135–145)
Total Bilirubin: 0.8 mg/dL (ref 0.3–1.2)
Total Protein: 5.6 g/dL — ABNORMAL LOW (ref 6.5–8.1)

## 2020-08-28 LAB — BODY FLUID CULTURE W GRAM STAIN: Culture: NO GROWTH

## 2020-08-28 LAB — CBC
HCT: 25.4 % — ABNORMAL LOW (ref 39.0–52.0)
Hemoglobin: 8.7 g/dL — ABNORMAL LOW (ref 13.0–17.0)
MCH: 31.9 pg (ref 26.0–34.0)
MCHC: 34.3 g/dL (ref 30.0–36.0)
MCV: 93 fL (ref 80.0–100.0)
Platelets: 451 10*3/uL — ABNORMAL HIGH (ref 150–400)
RBC: 2.73 MIL/uL — ABNORMAL LOW (ref 4.22–5.81)
RDW: 15.6 % — ABNORMAL HIGH (ref 11.5–15.5)
WBC: 10.7 10*3/uL — ABNORMAL HIGH (ref 4.0–10.5)
nRBC: 0 % (ref 0.0–0.2)

## 2020-08-28 LAB — GLUCOSE, CAPILLARY
Glucose-Capillary: 318 mg/dL — ABNORMAL HIGH (ref 70–99)
Glucose-Capillary: 147 mg/dL — ABNORMAL HIGH (ref 70–99)
Glucose-Capillary: 216 mg/dL — ABNORMAL HIGH (ref 70–99)
Glucose-Capillary: 232 mg/dL — ABNORMAL HIGH (ref 70–99)
Glucose-Capillary: 241 mg/dL — ABNORMAL HIGH (ref 70–99)

## 2020-08-28 MED ORDER — PANTOPRAZOLE SODIUM 40 MG PO TBEC
40.0000 mg | DELAYED_RELEASE_TABLET | Freq: Two times a day (BID) | ORAL | Status: DC
  Administered 2020-08-28 – 2020-09-01 (×9): 40 mg via ORAL
  Filled 2020-08-28 (×9): qty 1

## 2020-08-28 NOTE — Progress Notes (Signed)
[facility] IP rehab admissions - patient did not tolerate therapies well yesterday according to their notes.  I cannot submit to [facility] for approval since patient is not fully participatory at this point.  Will continue to follow for progress.  Call for questions.  [phone]

## 2020-08-28 NOTE — Consult Note (Signed)
[facility] Nurse ostomy follow up; one hour spent with patient and his SO   Patient has had issues all night with pain management and the exclusion of scheduled Toradol.  He is anxious but pleasant.  Stoma type/location: LUQ, end colostomy Large stool;hardened/formed has leaking from the skin barrier in to the edge of the midline wound. Wound cleansed and dressing reinforced.  Neoma Laming is upset and wants ostomy skin barrier to not touch wound edge.  She is upset when pouch is challenging to remove.  She is quite shaky at this point.  I have obtained adhesive remover and demonstrated use, however it is noted that patient grimacing and moaning but does not indicate where his pain is.  We stopped pouch change for him to take Oxycontin and outline his pain management schedule with bedside RN.  Taking an extensive amount of time to discuss this. I assume the care of pouch change bc Neoma Laming seems to be causing the patient more anxiety.  Stomal assessment/size: 1 1/4" x 1 3/8" oval shaped stoma, pink, moist  I had allowed Neoma Laming to cut new skin barrier and it was cut much to large for current stoma. I measured again and demonstrated making new pattern, I cut new skin barrier  Peristomal assessment: intact; hair is growing quite a bit; attempted dry shave bc Neoma Laming and I had talked about needed to perform this on a regular basis Treatment options for stomal/peristomal skin; 2" skin barrier ring; 1/2 of skin barrier ring from 6-12 o'clock  Output thick; brown stool  Ostomy pouching: 2pc. 2 3/4" with 2" skin barrier and 1/2 of additional skin barrier from 6-12 o'clock  Education provided:  See above  Enrolled patient in [facility] Start Discharge program: Yes  Patient is not able to participate in his own care at this time due to limited ROM in the bilateral upper extremities.   [place] Nurse will follow along with you for continued support with ostomy teaching and care [place] MSN, RN, [place], [place],  [place]

## 2020-08-28 NOTE — Progress Notes (Addendum)
13 Days Post-Op  Subjective: CC: Doing well. No abdominal pain, n/v. Having ostomy output. Melena resolved. Voiding. Tolerating diet. Less pain in elbows. Still having pain in knee's. Did work with therapies yesterday.   Objective: Vital signs in last 24 hours: Temp:  [98.2 F (36.8 C)-98.5 F (36.9 C)] 98.2 F (36.8 C) (07/29 0505) Pulse Rate:  [93-98] 93 (07/29 0505) Resp:  [16-20] 20 (07/29 0505) BP: (123-137)/(73-97) 134/97 (07/29 0505) SpO2:  [97 %-98 %] 97 % (07/29 0505) Last BM Date: 08/26/20  Intake/Output from previous day: 07/28 0701 - 07/29 0700 In: 52 [P.O.:480] Out: 2460 [Urine:2450; Drains:10] Intake/Output this shift: Total I/O In: -  Out: 325 [Urine:325]  PE: Gen:  Alert, NAD, pleasant HEENT: EOM's intact, pupils equal and round Card: RRR  Pulm:  CTAB, no W/R/R, effort normal. On RA Abd: Soft, ND, appropriately tender around wounds, +BS, colostomy bag with brown stool. Unable to visualize colostomy 2/2 stool. Drain SS. Wounds stable. LLQ wound with healthy granulation tissue at the base. Midline wound with mixed granulation and fatty tissue without dehiscence or evisceration. The inferior aspect of the midline wound tunnels and connects to the LLQ wound. RLQ wound with healthy grannulation tissue at the base. No drainage. No periwound cellulitis.  Ext: Left elbow and right elbow w/ inc rom from yesterday. Right knee swelling and tenderness. Radial and DP 2+.   Psych: A&Ox3 Skin: no rashes noted, warm and dry    Lab Results:  Recent Labs    08/27/20 0630 08/28/20 0550  WBC 13.7* 10.7*  HGB 8.4* 8.7*  HCT 25.3* 25.4*  PLT 457* 451*   BMET Recent Labs    08/27/20 0630 08/28/20 0550  NA 128* 130*  K 3.5 3.6  CL 98 98  CO2 24 24  GLUCOSE 186* 160*  BUN 15 18  CREATININE 0.61 0.66  CALCIUM 7.9* 8.2*   PT/INR No results for input(s): LABPROT, INR in the last 72 hours. CMP     Component Value Date/Time   NA 130 (L) 08/28/2020 0550   K  3.6 08/28/2020 0550   CL 98 08/28/2020 0550   CO2 24 08/28/2020 0550   GLUCOSE 160 (H) 08/28/2020 0550   BUN 18 08/28/2020 0550   CREATININE 0.66 08/28/2020 0550   CALCIUM 8.2 (L) 08/28/2020 0550   PROT 5.6 (L) 08/28/2020 0550   ALBUMIN 1.7 (L) 08/28/2020 0550   AST 29 08/28/2020 0550   ALT 44 08/28/2020 0550   ALKPHOS 290 (H) 08/28/2020 0550   BILITOT 0.8 08/28/2020 0550   GFRNONAA >60 08/28/2020 0550   GFRAA SPECIMEN CONTAMINATED, UNABLE TO PERFORM TEST(S). 08/19/2020 1236   Lipase     Component Value Date/Time   LIPASE 18 08/12/2020 1010    Studies/Results: No results found.  Anti-infectives: Anti-infectives (From admission, onward)    Start     Dose/Rate Route Frequency Ordered Stop   08/15/20 1000  anidulafungin (ERAXIS) 100 mg in sodium chloride 0.9 % 100 mL IVPB        100 mg 78 mL/hr over 100 Minutes Intravenous Every 24 hours 08/14/20 1349 08/20/20 1326   08/14/20 1600  anidulafungin (ERAXIS) 200 mg in sodium chloride 0.9 % 200 mL IVPB        200 mg 78 mL/hr over 200 Minutes Intravenous  Once 08/14/20 1349 08/14/20 2000   08/14/20 1000  vancomycin (VANCOREADY) IVPB 1500 mg/300 mL  Status:  Discontinued        1,500 mg 150 mL/hr over  120 Minutes Intravenous Every 24 hours 08/13/20 1009 08/14/20 0901   08/13/20 1000  vancomycin (VANCOREADY) IVPB 1250 mg/250 mL  Status:  Discontinued        1,250 mg 166.7 mL/hr over 90 Minutes Intravenous Every 24 hours 08/12/20 1910 08/13/20 1009   08/12/20 2200  piperacillin-tazobactam (ZOSYN) IVPB 3.375 g  Status:  Discontinued        3.375 g 12.5 mL/hr over 240 Minutes Intravenous Every 8 hours 08/12/20 1904 08/12/20 1941   08/12/20 2000  clindamycin (CLEOCIN) IVPB 600 mg  Status:  Discontinued        600 mg 100 mL/hr over 30 Minutes Intravenous Every 8 hours 08/12/20 1757 08/17/20 0844   08/12/20 1830  piperacillin-tazobactam (ZOSYN) IVPB 3.375 g        3.375 g 12.5 mL/hr over 240 Minutes Intravenous Every 8 hours 08/12/20  1811 08/26/20 1829   08/12/20 1015  vancomycin (VANCOREADY) IVPB 1500 mg/300 mL        1,500 mg 150 mL/hr over 120 Minutes Intravenous  Once 08/12/20 1007 08/12/20 1330   08/12/20 1000  ceFEPIme (MAXIPIME) 2 g in sodium chloride 0.9 % 100 mL IVPB        2 g 200 mL/hr over 30 Minutes Intravenous  Once 08/12/20 1000 08/12/20 1121   08/12/20 1000  metroNIDAZOLE (FLAGYL) IVPB 500 mg        500 mg 100 mL/hr over 60 Minutes Intravenous  Once 08/12/20 1000 08/12/20 1226   08/12/20 1000  vancomycin (VANCOCIN) IVPB 1000 mg/200 mL premix  Status:  Discontinued        1,000 mg 200 mL/hr over 60 Minutes Intravenous  Once 08/12/20 1000 08/12/20 1007        Assessment/Plan POD 16 s/p drainage and debridement of LLQ abdominal wall 15 x 20 cm, partial colectomy with end colostomy, Splenic mobilization by Dr. Kieth Brightly on 08/12/20 for abdominal wall abscess, diverticulitis POD 14 s/p reopening of recent laparotomy, lysis of adhesions for 45 min. Small bowel resection with anastomosis, enterrorhaphy x 2, debridement of 200 cm^2 of necrotic fat and fascia of abdominal wall by Dr. Kieth Brightly on 08/14/20 for intestinal injury POD 13 s/p Debridement of abdominal wall with above dimensions with sharp dissection and Pulsavac of wounds by Dr. Brantley Stage on 7/16 for Open abdominal wall wounds left flank, right lower quadrant total area 738 cm ^2  - The area left lower quadrants was 20 cm x 30 cm and midline 20 cm x 6 cm and right lower quadrant 6 x 3 cm - Path 7/13 w/ diverticulitis - no evidence of malignancy  - On soft diet. Nutrition following. - Colostomy functioning - Cont wound care. BID WTD. Monitor LLQ wound where I had to bluntly opened false bottom 7/21 and 7/25. Make sure this area is packed. - WOCN following. - Cont abx. Comp 7d Eraxis, on Zosyn and completed 14d. Will consider stopping - discuss with MD today.  - Cont drain. SS. Consider removing before d/c to CIR. Will discuss with MD.  - Mobilize. PT/OT  rec CIR - Pulm toilet   FEN - Soft diet. BID PPI. Appreciate TRH assistance w/ fluid and electrolyte balance VTE - SCDs, therapeutic Lovenox ID - Currently on Zosyn. Possible stop date today. D/c PICC. WBC 10.7. Afebrile.  Foley - Out, voiding Dispo - CIR following. Cleared for discharge to CIR when bed available    Hyponatremia - appreciate TRH assistance. Improving  RUE/RLE DVT - Lovenox  ABL anemia - Hgb 5.6 >  2U PRBC > 8.1 > 5.7 > 2U PRBC > 7.1 > 6.4 7/26 > 2U PRBC > 8.5 > 8.4 > 8.7 and stable . Hyperglycemia - stable. SSI Protein Calorie Malnutrition - Pre-alb < 5 -> 11.4. TPN off, on diet. Nutrition following for supplements.  L elbow, R elbow, and R knee pain/swelling - Appreciate Dr. Aurea Graff assistance. S/p aspiration of R knee. On Toradol. Defer interpretation of fluid from aspiration and management to Ortho   LOS: 16 days    Jillyn Ledger , [facility] 08/28/2020, 9:21 AM Please see Amion for pager number during day hours 7:00am-4:30pm

## 2020-08-28 NOTE — Progress Notes (Signed)

## 2020-08-28 NOTE — Progress Notes (Signed)
PROGRESS NOTE    Roger Brennan  [id number] DOB: 08-28-1960 DOA: 08/12/2020 PCP: [name], MD    Brief Narrative:  60 y.o. male with no significant past medical history. Presenting with abdominal pain. He was admitted to the surgical service for diverticulitis w/ perforation. He had a partial colectomy and colostomy placement. He developed an abdominal wall infection requiring debridement. His stay has been complicated by DVTs and acute blood loss anemia.    Over the last several days, the patient has developed hyponatremia. It was noted last night that his sodium was 124. This morning it is 128. Since his the beginning of his admission, he is 4.7L fluid positive per documentation. TRH was consulted for concern of fluid status and hyponatremia.  Assessment & Plan:   Active Problems:   Perforation of colon (HCC)   Severe sepsis (HCC)   Pressure injury of skin  Hyponatremia     -Sodium improved with IVF hydration      -sodium up to 130    - without neurologic symptoms   - Serum osm borderline low at 275, urine osm borderline low 410   - Cont mild fluid restriction    - repeat bmet in AM   Diverticulitis w/ perforation Abdominal wall infection s/p ex lap/debridements RUE/RLE DVT Acute blood loss anemia Hyperlgycemia Polyarticular pain/swelling     - Orthopedic surgery had been following  Antimicrobials: Anti-infectives (From admission, onward)    Start     Dose/Rate Route Frequency Ordered Stop   08/15/20 1000  anidulafungin (ERAXIS) 100 mg in sodium chloride 0.9 % 100 mL IVPB        100 mg 78 mL/hr over 100 Minutes Intravenous Every 24 hours 08/14/20 1349 08/20/20 1326   08/14/20 1600  anidulafungin (ERAXIS) 200 mg in sodium chloride 0.9 % 200 mL IVPB        200 mg 78 mL/hr over 200 Minutes Intravenous  Once 08/14/20 1349 08/14/20 2000   08/14/20 1000  vancomycin (VANCOREADY) IVPB 1500 mg/300 mL  Status:  Discontinued        1,500 mg 150 mL/hr over 120 Minutes  Intravenous Every 24 hours 08/13/20 1009 08/14/20 0901   08/13/20 1000  vancomycin (VANCOREADY) IVPB 1250 mg/250 mL  Status:  Discontinued        1,250 mg 166.7 mL/hr over 90 Minutes Intravenous Every 24 hours 08/12/20 1910 08/13/20 1009   08/12/20 2200  piperacillin-tazobactam (ZOSYN) IVPB 3.375 g  Status:  Discontinued        3.375 g 12.5 mL/hr over 240 Minutes Intravenous Every 8 hours 08/12/20 1904 08/12/20 1941   08/12/20 2000  clindamycin (CLEOCIN) IVPB 600 mg  Status:  Discontinued        600 mg 100 mL/hr over 30 Minutes Intravenous Every 8 hours 08/12/20 1757 08/17/20 0844   08/12/20 1830  piperacillin-tazobactam (ZOSYN) IVPB 3.375 g        3.375 g 12.5 mL/hr over 240 Minutes Intravenous Every 8 hours 08/12/20 1811 08/26/20 1829   08/12/20 1015  vancomycin (VANCOREADY) IVPB 1500 mg/300 mL        1,500 mg 150 mL/hr over 120 Minutes Intravenous  Once 08/12/20 1007 08/12/20 1330   08/12/20 1000  ceFEPIme (MAXIPIME) 2 g in sodium chloride 0.9 % 100 mL IVPB        2 g 200 mL/hr over 30 Minutes Intravenous  Once 08/12/20 1000 08/12/20 1121   08/12/20 1000  metroNIDAZOLE (FLAGYL) IVPB 500 mg  500 mg 100 mL/hr over 60 Minutes Intravenous  Once 08/12/20 1000 08/12/20 1226   08/12/20 1000  vancomycin (VANCOCIN) IVPB 1000 mg/200 mL premix  Status:  Discontinued        1,000 mg 200 mL/hr over 60 Minutes Intravenous  Once 08/12/20 1000 08/12/20 1007       Subjective: Complains of marked joint pains this afternoon  Objective: Vitals:   08/27/20 1418 08/27/20 2116 08/28/20 0505 08/28/20 1323  BP: 123/73 137/80 (!) 134/97 114/75  Pulse: 95 98 93 95  Resp: '16 20 20 15  '$ Temp: 98.5 F (36.9 C) 98.2 F (36.8 C) 98.2 F (36.8 C) 98.2 F (36.8 C)  TempSrc: Oral Oral Oral Oral  SpO2: 98% 98% 97% 100%  Weight:      Height:        Intake/Output Summary (Last 24 hours) at 08/28/2020 1752 Last data filed at 08/28/2020 0900 Gross per 24 hour  Intake --  Output 2535 ml  Net -2535  ml    Filed Weights   08/14/20 1420 08/15/20 0445 08/21/20 1336  Weight: 83.8 kg 86.1 kg 79.8 kg    Examination: General exam: Conversant, in no acute distress Respiratory system: normal chest rise, clear, no audible wheezing Cardiovascular system: regular rhythm, s1-s2 Gastrointestinal system: Nondistended, nontender, pos BS Central nervous system: No seizures, no tremors Extremities: No cyanosis, no joint deformities Skin: No rashes, no pallor Psychiatry: Affect normal // no auditory hallucinations   Data Reviewed: I have personally reviewed following labs and imaging studies  CBC: Recent Labs  Lab 08/24/20 2113 08/25/20 0613 08/26/20 1206 08/27/20 0630 08/28/20 0550  WBC 19.1* 19.1* 12.7* 13.7* 10.7*  HGB 7.1* 6.4* 8.5* 8.4* 8.7*  HCT 21.4* 19.5* 25.4* 25.3* 25.4*  MCV 93.9 94.2 92.4 92.0 93.0  PLT 450* 472* 417* 457* 451*    Basic Metabolic Panel: Recent Labs  Lab 08/24/20 2113 08/25/20 0613 08/26/20 1206 08/27/20 0630 08/28/20 0550  NA 124* 128* 129* 128* 130*  K 3.6 3.8 3.9 3.5 3.6  CL 94* 97* 98 98 98  CO2 '23 24 23 24 24  '$ GLUCOSE 186* 190* 293* 186* 160*  BUN '17 14 18 15 18  '$ CREATININE 0.69 0.68 0.79 0.61 0.66  CALCIUM 7.5* 7.9* 7.8* 7.9* 8.2*  PHOS  --   --  3.6  --   --     GFR: Estimated Creatinine Clearance: 101.4 mL/min (by C-G formula based on SCr of 0.66 mg/dL). Liver Function Tests: Recent Labs  Lab 08/26/20 1206 08/28/20 0550  AST  --  29  ALT  --  44  ALKPHOS  --  290*  BILITOT  --  0.8  PROT  --  5.6*  ALBUMIN 1.7* 1.7*    No results for input(s): LIPASE, AMYLASE in the last 168 hours. No results for input(s): AMMONIA in the last 168 hours. Coagulation Profile: No results for input(s): INR, PROTIME in the last 168 hours. Cardiac Enzymes: No results for input(s): CKTOTAL, CKMB, CKMBINDEX, TROPONINI in the last 168 hours. BNP (last 3 results) No results for input(s): PROBNP in the last 8760 hours. HbA1C: No results for  input(s): HGBA1C in the last 72 hours. CBG: Recent Labs  Lab 08/27/20 1213 08/27/20 1701 08/28/20 0728 08/28/20 1133 08/28/20 1638  GLUCAP 226* 160* 147* 216* 232*    Lipid Profile: No results for input(s): CHOL, HDL, LDLCALC, TRIG, CHOLHDL, LDLDIRECT in the last 72 hours. Thyroid Function Tests: No results for input(s): TSH, T4TOTAL, FREET4, T3FREE, THYROIDAB in  the last 72 hours. Anemia Panel: No results for input(s): VITAMINB12, FOLATE, FERRITIN, TIBC, IRON, RETICCTPCT in the last 72 hours. Sepsis Labs: No results for input(s): PROCALCITON, LATICACIDVEN in the last 168 hours.  Recent Results (from the past 240 hour(s))  Body fluid culture w Gram Stain     Status: None   Collection Time: 08/25/20  3:08 PM   Specimen: KNEE; Synovial Fluid  Result Value Ref Range Status   Specimen Description   Final    KNEE RIGHT Performed at [facility] [address], [place], [place] [address]    Special Requests NONE  Final   Gram Stain   Final    ABUNDANT WBC PRESENT, PREDOMINANTLY PMN NO ORGANISMS SEEN    Culture   Final    NO GROWTH Performed at [facility], [place] [address], [place], [place] [address]    Report Status 08/28/2020 FINAL  Final      Radiology Studies: No results found.  Scheduled Meds:  Chlorhexidine Gluconate Cloth  6 each Topical Daily   enoxaparin (LOVENOX) injection  80 mg Subcutaneous Q12H   feeding supplement  1 Container Oral BID BM   feeding supplement  237 mL Oral BID BM   insulin aspart  0-15 Units Subcutaneous TID WC   mouth rinse  15 mL Mouth Rinse BID   methocarbamol  1,000 mg Oral TID   multivitamin with minerals  1 tablet Oral Daily   nutrition supplement (JUVEN)  1 packet Oral BID BM   pantoprazole  40 mg Oral BID   sodium chloride flush  10-40 mL Intracatheter Q12H   Continuous Infusions:     LOS: 16 days   Marylu Lund, MD [facility] Pager On Amion  If 7PM-7AM, please contact  night-coverage 08/28/2020, 5:52 PM

## 2020-08-29 DIAGNOSIS — R1030 Lower abdominal pain, unspecified: Secondary | ICD-10-CM | POA: Diagnosis not present

## 2020-08-29 LAB — BASIC METABOLIC PANEL
Anion gap: 8 (ref 5–15)
BUN: 16 mg/dL (ref 6–20)
CO2: 24 mmol/L (ref 22–32)
Calcium: 8 mg/dL — ABNORMAL LOW (ref 8.9–10.3)
Chloride: 98 mmol/L (ref 98–111)
Creatinine, Ser: 0.57 mg/dL — ABNORMAL LOW (ref 0.61–1.24)
GFR, Estimated: >60 mL/min (ref >60)
Glucose, Bld: 150 mg/dL — ABNORMAL HIGH (ref 70–99)
Potassium: 3.6 mmol/L (ref 3.5–5.1)
Sodium: 130 mmol/L — ABNORMAL LOW (ref 135–145)

## 2020-08-29 LAB — CBC
HCT: 26.2 % — ABNORMAL LOW (ref 39.0–52.0)
Hemoglobin: 8.5 g/dL — ABNORMAL LOW (ref 13.0–17.0)
MCH: 30 pg (ref 26.0–34.0)
MCHC: 32.4 g/dL (ref 30.0–36.0)
MCV: 92.6 fL (ref 80.0–100.0)
Platelets: 478 10*3/uL — ABNORMAL HIGH (ref 150–400)
RBC: 2.83 MIL/uL — ABNORMAL LOW (ref 4.22–5.81)
RDW: 15.3 % (ref 11.5–15.5)
WBC: 9.5 10*3/uL (ref 4.0–10.5)
nRBC: 0 % (ref 0.0–0.2)

## 2020-08-29 LAB — GLUCOSE, CAPILLARY
Glucose-Capillary: 121 mg/dL — ABNORMAL HIGH (ref 70–99)
Glucose-Capillary: 142 mg/dL — ABNORMAL HIGH (ref 70–99)
Glucose-Capillary: 177 mg/dL — ABNORMAL HIGH (ref 70–99)
Glucose-Capillary: 186 mg/dL — ABNORMAL HIGH (ref 70–99)
Glucose-Capillary: 189 mg/dL — ABNORMAL HIGH (ref 70–99)
Glucose-Capillary: 219 mg/dL — ABNORMAL HIGH (ref 70–99)

## 2020-08-29 MED ORDER — KETOROLAC TROMETHAMINE 15 MG/ML IJ SOLN
15.0000 mg | Freq: Three times a day (TID) | INTRAMUSCULAR | Status: DC
  Administered 2020-08-29 – 2020-08-30 (×2): 15 mg via INTRAVENOUS
  Filled 2020-08-29 (×2): qty 1

## 2020-08-29 MED ORDER — KETOROLAC TROMETHAMINE 15 MG/ML IJ SOLN
7.5000 mg | Freq: Once | INTRAMUSCULAR | Status: AC
  Administered 2020-08-29: 7.5 mg via INTRAVENOUS
  Filled 2020-08-29: qty 1

## 2020-08-29 NOTE — Progress Notes (Signed)
PROGRESS NOTE    Roger Brennan  [id number] DOB: Jan 15, 1961 DOA: 08/12/2020 PCP: [name], MD    Brief Narrative:  60 y.o. male with no significant past medical history. Presenting with abdominal pain. He was admitted to the surgical service for diverticulitis w/ perforation. He had a partial colectomy and colostomy placement. He developed an abdominal wall infection requiring debridement. His stay has been complicated by DVTs and acute blood loss anemia.    Over the last several days, the patient has developed hyponatremia. It was noted last night that his sodium was 124. This morning it is 128. Since his the beginning of his admission, he is 4.7L fluid positive per documentation. TRH was consulted for concern of fluid status and hyponatremia.  Assessment & Plan:   Active Problems:   Perforation of colon (HCC)   Severe sepsis (HCC)   Pressure injury of skin  Hyponatremia     -Sodium improved with IVF hydration      -sodium at 130 today    - without neurologic symptoms   - Recent serum osm borderline low at 275, urine osm borderline low 410   - Cont mild fluid restriction    - cont to follow bmet trends  Diverticulitis w/ perforation Abdominal wall infection s/p ex lap/debridements RUE/RLE DVT Acute blood loss anemia Hyperlgycemia Polyarticular pain/swelling     - Orthopedic surgery is following  Antimicrobials: Anti-infectives (From admission, onward)    Start     Dose/Rate Route Frequency Ordered Stop   08/15/20 1000  anidulafungin (ERAXIS) 100 mg in sodium chloride 0.9 % 100 mL IVPB        100 mg 78 mL/hr over 100 Minutes Intravenous Every 24 hours 08/14/20 1349 08/20/20 1326   08/14/20 1600  anidulafungin (ERAXIS) 200 mg in sodium chloride 0.9 % 200 mL IVPB        200 mg 78 mL/hr over 200 Minutes Intravenous  Once 08/14/20 1349 08/14/20 2000   08/14/20 1000  vancomycin (VANCOREADY) IVPB 1500 mg/300 mL  Status:  Discontinued        1,500 mg 150 mL/hr over  120 Minutes Intravenous Every 24 hours 08/13/20 1009 08/14/20 0901   08/13/20 1000  vancomycin (VANCOREADY) IVPB 1250 mg/250 mL  Status:  Discontinued        1,250 mg 166.7 mL/hr over 90 Minutes Intravenous Every 24 hours 08/12/20 1910 08/13/20 1009   08/12/20 2200  piperacillin-tazobactam (ZOSYN) IVPB 3.375 g  Status:  Discontinued        3.375 g 12.5 mL/hr over 240 Minutes Intravenous Every 8 hours 08/12/20 1904 08/12/20 1941   08/12/20 2000  clindamycin (CLEOCIN) IVPB 600 mg  Status:  Discontinued        600 mg 100 mL/hr over 30 Minutes Intravenous Every 8 hours 08/12/20 1757 08/17/20 0844   08/12/20 1830  piperacillin-tazobactam (ZOSYN) IVPB 3.375 g        3.375 g 12.5 mL/hr over 240 Minutes Intravenous Every 8 hours 08/12/20 1811 08/26/20 1829   08/12/20 1015  vancomycin (VANCOREADY) IVPB 1500 mg/300 mL        1,500 mg 150 mL/hr over 120 Minutes Intravenous  Once 08/12/20 1007 08/12/20 1330   08/12/20 1000  ceFEPIme (MAXIPIME) 2 g in sodium chloride 0.9 % 100 mL IVPB        2 g 200 mL/hr over 30 Minutes Intravenous  Once 08/12/20 1000 08/12/20 1121   08/12/20 1000  metroNIDAZOLE (FLAGYL) IVPB 500 mg  500 mg 100 mL/hr over 60 Minutes Intravenous  Once 08/12/20 1000 08/12/20 1226   08/12/20 1000  vancomycin (VANCOCIN) IVPB 1000 mg/200 mL premix  Status:  Discontinued        1,000 mg 200 mL/hr over 60 Minutes Intravenous  Once 08/12/20 1000 08/12/20 1007       Subjective: Without complaints this afternoon  Objective: Vitals:   08/28/20 1323 08/28/20 2051 08/29/20 0422 08/29/20 1527  BP: 114/75 140/80 130/84 123/80  Pulse: 95 100 95 97  Resp: '15 20 20 17  '$ Temp: 98.2 F (36.8 C) 98 F (36.7 C) 97.9 F (36.6 C) (!) 97.5 F (36.4 C)  TempSrc: Oral Oral Oral Oral  SpO2: 100% 96% 96% 99%  Weight:      Height:        Intake/Output Summary (Last 24 hours) at 08/29/2020 1816 Last data filed at 08/29/2020 1300 Gross per 24 hour  Intake --  Output 550 ml  Net -550 ml     Filed Weights   08/14/20 1420 08/15/20 0445 08/21/20 1336  Weight: 83.8 kg 86.1 kg 79.8 kg    Examination: General exam: Awake, laying in bed, in nad Respiratory system: Normal respiratory effort, no wheezing Cardiovascular system: regular rate, s1, s2 Gastrointestinal system: Soft, nondistended, positive BS Central nervous system: CN2-12 grossly intact, strength intact Extremities: Perfused, no clubbing Skin: Normal skin turgor, no notable skin lesions seen Psychiatry: Mood normal // no visual hallucinations   Data Reviewed: I have personally reviewed following labs and imaging studies  CBC: Recent Labs  Lab 08/25/20 0613 08/26/20 1206 08/27/20 0630 08/28/20 0550 08/29/20 0726  WBC 19.1* 12.7* 13.7* 10.7* 9.5  HGB 6.4* 8.5* 8.4* 8.7* 8.5*  HCT 19.5* 25.4* 25.3* 25.4* 26.2*  MCV 94.2 92.4 92.0 93.0 92.6  PLT 472* 417* 457* 451* 478*    Basic Metabolic Panel: Recent Labs  Lab 08/25/20 0613 08/26/20 1206 08/27/20 0630 08/28/20 0550 08/29/20 0726  NA 128* 129* 128* 130* 130*  K 3.8 3.9 3.5 3.6 3.6  CL 97* 98 98 98 98  CO2 '24 23 24 24 24  '$ GLUCOSE 190* 293* 186* 160* 150*  BUN '14 18 15 18 16  '$ CREATININE 0.68 0.79 0.61 0.66 0.57*  CALCIUM 7.9* 7.8* 7.9* 8.2* 8.0*  PHOS  --  3.6  --   --   --     GFR: Estimated Creatinine Clearance: 101.4 mL/min (A) (by C-G formula based on SCr of 0.57 mg/dL (L)). Liver Function Tests: Recent Labs  Lab 08/26/20 1206 08/28/20 0550  AST  --  29  ALT  --  44  ALKPHOS  --  290*  BILITOT  --  0.8  PROT  --  5.6*  ALBUMIN 1.7* 1.7*    No results for input(s): LIPASE, AMYLASE in the last 168 hours. No results for input(s): AMMONIA in the last 168 hours. Coagulation Profile: No results for input(s): INR, PROTIME in the last 168 hours. Cardiac Enzymes: No results for input(s): CKTOTAL, CKMB, CKMBINDEX, TROPONINI in the last 168 hours. BNP (last 3 results) No results for input(s): PROBNP in the last 8760  hours. HbA1C: No results for input(s): HGBA1C in the last 72 hours. CBG: Recent Labs  Lab 08/28/20 2048 08/28/20 2357 08/29/20 0418 08/29/20 0800 08/29/20 1156  GLUCAP 241* 189* 142* 121* 186*    Lipid Profile: No results for input(s): CHOL, HDL, LDLCALC, TRIG, CHOLHDL, LDLDIRECT in the last 72 hours. Thyroid Function Tests: No results for input(s): TSH, T4TOTAL, FREET4, T3FREE,  THYROIDAB in the last 72 hours. Anemia Panel: No results for input(s): VITAMINB12, FOLATE, FERRITIN, TIBC, IRON, RETICCTPCT in the last 72 hours. Sepsis Labs: No results for input(s): PROCALCITON, LATICACIDVEN in the last 168 hours.  Recent Results (from the past 240 hour(s))  Body fluid culture w Gram Stain     Status: None   Collection Time: 08/25/20  3:08 PM   Specimen: KNEE; Synovial Fluid  Result Value Ref Range Status   Specimen Description   Final    KNEE RIGHT Performed at [facility] [address], [place], [place] [address]    Special Requests NONE  Final   Gram Stain   Final    ABUNDANT WBC PRESENT, PREDOMINANTLY PMN NO ORGANISMS SEEN    Culture   Final    NO GROWTH Performed at [facility], [place] [address] [place], [place] [address]    Report Status 08/28/2020 FINAL  Final      Radiology Studies: No results found.  Scheduled Meds:  Chlorhexidine Gluconate Cloth  6 each Topical Daily   enoxaparin (LOVENOX) injection  80 mg Subcutaneous Q12H   feeding supplement  1 Container Oral BID BM   feeding supplement  237 mL Oral BID BM   insulin aspart  0-15 Units Subcutaneous TID WC   ketorolac  15 mg Intravenous Q8H   mouth rinse  15 mL Mouth Rinse BID   methocarbamol  1,000 mg Oral TID   multivitamin with minerals  1 tablet Oral Daily   nutrition supplement (JUVEN)  1 packet Oral BID BM   pantoprazole  40 mg Oral BID   sodium chloride flush  10-40 mL Intracatheter Q12H   Continuous Infusions:     LOS: 17 days   Marylu Lund, MD [facility] Pager On Amion  If 7PM-7AM, please contact night-coverage 08/29/2020, 6:16 PM

## 2020-08-29 NOTE — Progress Notes (Signed)
14 Days Post-Op  Subjective: CC: Doing well. No abdominal pain, n/v. Having ostomy output.  Voiding. Tolerating diet.  Still having pain in knee's.  Objective: Vital signs in last 24 hours: Temp:  [97.9 F (36.6 C)-98.2 F (36.8 C)] 97.9 F (36.6 C) (07/30 0422) Pulse Rate:  [95-100] 95 (07/30 0422) Resp:  [15-20] 20 (07/30 0422) BP: (114-140)/(75-84) 130/84 (07/30 0422) SpO2:  [96 %-100 %] 96 % (07/30 0422) Last BM Date: 08/28/20  Intake/Output from previous day: 07/29 0701 - 07/30 0700 In: -  Out: 875 [Urine:325; Stool:550] Intake/Output this shift: No intake/output data recorded.  PE: Gen:  Alert, NAD, pleasant Abd: Soft, ND, appropriately tender around wounds, +BS, colostomy bag with brown stool. Unable to visualize colostomy 2/2 stool. Drain SS. Wounds stable.  Psych: A&Ox3 Skin: no rashes noted, warm and dry    Lab Results:  Recent Labs    08/28/20 0550 08/29/20 0726  WBC 10.7* 9.5  HGB 8.7* 8.5*  HCT 25.4* 26.2*  PLT 451* 478*    BMET Recent Labs    08/27/20 0630 08/28/20 0550  NA 128* 130*  K 3.5 3.6  CL 98 98  CO2 24 24  GLUCOSE 186* 160*  BUN 15 18  CREATININE 0.61 0.66  CALCIUM 7.9* 8.2*    PT/INR No results for input(s): LABPROT, INR in the last 72 hours. CMP     Component Value Date/Time   NA 130 (L) 08/28/2020 0550   K 3.6 08/28/2020 0550   CL 98 08/28/2020 0550   CO2 24 08/28/2020 0550   GLUCOSE 160 (H) 08/28/2020 0550   BUN 18 08/28/2020 0550   CREATININE 0.66 08/28/2020 0550   CALCIUM 8.2 (L) 08/28/2020 0550   PROT 5.6 (L) 08/28/2020 0550   ALBUMIN 1.7 (L) 08/28/2020 0550   AST 29 08/28/2020 0550   ALT 44 08/28/2020 0550   ALKPHOS 290 (H) 08/28/2020 0550   BILITOT 0.8 08/28/2020 0550   GFRNONAA >60 08/28/2020 0550   GFRAA SPECIMEN CONTAMINATED, UNABLE TO PERFORM TEST(S). 08/19/2020 1236   Lipase     Component Value Date/Time   LIPASE 18 08/12/2020 1010    Studies/Results: No results  found.  Anti-infectives: Anti-infectives (From admission, onward)    Start     Dose/Rate Route Frequency Ordered Stop   08/15/20 1000  anidulafungin (ERAXIS) 100 mg in sodium chloride 0.9 % 100 mL IVPB        100 mg 78 mL/hr over 100 Minutes Intravenous Every 24 hours 08/14/20 1349 08/20/20 1326   08/14/20 1600  anidulafungin (ERAXIS) 200 mg in sodium chloride 0.9 % 200 mL IVPB        200 mg 78 mL/hr over 200 Minutes Intravenous  Once 08/14/20 1349 08/14/20 2000   08/14/20 1000  vancomycin (VANCOREADY) IVPB 1500 mg/300 mL  Status:  Discontinued        1,500 mg 150 mL/hr over 120 Minutes Intravenous Every 24 hours 08/13/20 1009 08/14/20 0901   08/13/20 1000  vancomycin (VANCOREADY) IVPB 1250 mg/250 mL  Status:  Discontinued        1,250 mg 166.7 mL/hr over 90 Minutes Intravenous Every 24 hours 08/12/20 1910 08/13/20 1009   08/12/20 2200  piperacillin-tazobactam (ZOSYN) IVPB 3.375 g  Status:  Discontinued        3.375 g 12.5 mL/hr over 240 Minutes Intravenous Every 8 hours 08/12/20 1904 08/12/20 1941   08/12/20 2000  clindamycin (CLEOCIN) IVPB 600 mg  Status:  Discontinued  600 mg 100 mL/hr over 30 Minutes Intravenous Every 8 hours 08/12/20 1757 08/17/20 0844   08/12/20 1830  piperacillin-tazobactam (ZOSYN) IVPB 3.375 g        3.375 g 12.5 mL/hr over 240 Minutes Intravenous Every 8 hours 08/12/20 1811 08/26/20 1829   08/12/20 1015  vancomycin (VANCOREADY) IVPB 1500 mg/300 mL        1,500 mg 150 mL/hr over 120 Minutes Intravenous  Once 08/12/20 1007 08/12/20 1330   08/12/20 1000  ceFEPIme (MAXIPIME) 2 g in sodium chloride 0.9 % 100 mL IVPB        2 g 200 mL/hr over 30 Minutes Intravenous  Once 08/12/20 1000 08/12/20 1121   08/12/20 1000  metroNIDAZOLE (FLAGYL) IVPB 500 mg        500 mg 100 mL/hr over 60 Minutes Intravenous  Once 08/12/20 1000 08/12/20 1226   08/12/20 1000  vancomycin (VANCOCIN) IVPB 1000 mg/200 mL premix  Status:  Discontinued        1,000 mg 200 mL/hr over  60 Minutes Intravenous  Once 08/12/20 1000 08/12/20 1007        Assessment/Plan POD 17 s/p drainage and debridement of LLQ abdominal wall 15 x 20 cm, partial colectomy with end colostomy, Splenic mobilization by Dr. Kieth Brightly on 08/12/20 for abdominal wall abscess, diverticulitis POD 15 s/p reopening of recent laparotomy, lysis of adhesions for 45 min. Small bowel resection with anastomosis, enterrorhaphy x 2, debridement of 200 cm^2 of necrotic fat and fascia of abdominal wall by Dr. Kieth Brightly on 08/14/20 for intestinal injury POD 14 s/p Debridement of abdominal wall with above dimensions with sharp dissection and Pulsavac of wounds by Dr. Brantley Stage on 7/16 for Open abdominal wall wounds left flank, right lower quadrant total area 738 cm ^2  - The area left lower quadrants was 20 cm x 30 cm and midline 20 cm x 6 cm and right lower quadrant 6 x 3 cm - Path 7/13 w/ diverticulitis - no evidence of malignancy  - On soft diet. Nutrition following. - Colostomy functioning - Cont wound care. BID WTD.  - WOCN following. - Comp 7d Eraxis, on Zosyn and completed 14d.  - Cont drain. SS. Consider removing before d/c to CIR.  - Mobilize. PT/OT rec CIR - Pulm toilet   FEN - Soft diet. BID PPI. Appreciate TRH assistance w/ fluid and electrolyte balance VTE - SCDs, therapeutic Lovenox ID - Abx completed. WBC normal.  Afebrile.  Foley - Out, voiding Dispo - CIR following. Cleared for discharge to CIR when bed available    Hyponatremia - appreciate TRH assistance. Improving  RUE/RLE DVT - Lovenox  ABL anemia - Hgb stable . Hyperglycemia - stable. SSI Protein Calorie Malnutrition - Pre-alb < 5 -> 11.4. TPN off, on diet. Nutrition following for supplements.  L elbow, R elbow, and R knee pain/swelling - Appreciate Dr. Aurea Graff assistance. S/p aspiration of R knee. On Toradol. Defer interpretation of fluid from aspiration and management to Ortho   LOS: 17 days   Rosario Adie, MD  [facility]

## 2020-08-29 NOTE — Progress Notes (Signed)
Occupational Therapy Treatment Patient Details Name: Roger Brennan MRN: [id number] DOB: Jun 06, 1960 Today's Date: 08/29/2020    History of present illness Patient is 60 yo  old male who presented to [facility] 08/12/20 at the direction of PCP with LLQ abdominal pain and induration for the last 2-3 weeks, 20 pound weight loss.  CT-Colon perforation with abscess and fistulization to abdominal wall .  S/P drainage and debridement of left lower quadrant abdominal wall ,. partial colectomy with end colostomy , Splenic mobilization on 08/12/20 emergently. 7/15 intestinal drainage from the wound >> emergent exploration; recurrent peritonitis, LOA ,Small bowel resection with anastomosis, enterrorhaphy x 2, debridement of 200 cm^2 of necrotic fat and fascia of abdominal wall; 7/16 debridement of abdominal wall   OT comments  Treatment focused on functional mobility to get out of bed, activity tolerance and self care. Patient overall mod assist but with +2 assistance to transfer to side of bed with increased time and RLE stabilized. Attempted to stand with walker but patient unable to power up. Mod x 2 with stedy to stand and able to transfer patient to recliner. Patient performed 10 min grooming task in recliner with feet down - knees flexed to approx 40 degrees. Improving activity tolerance. Continues to be limited by pain but able to functionally participate. Of note - RN was able to get patient one Toradol injection prior to treatment. Therapist still recommends aggressive rehab at discharge - though patient's pain tolerance is a barrier. Cont POC.   Follow Up Recommendations  CIR    Equipment Recommendations  Tub/shower seat;3 in 1 bedside commode    Recommendations for Other Services      Precautions / Restrictions Precautions Precautions: Fall Precaution Comments: colostomy, R JP drain, bandages over full abdomen, Restrictions Weight Bearing Restrictions: No       Mobility Bed Mobility Overal bed  mobility: Needs Assistance Bed Mobility: Supine to Sit     Supine to sit: Mod assist     General bed mobility comments: Mod assist needed to transfer to side of bed with increased time. RLE had to be stabilized into knee extension and assistance with trunk negotiation and scooting to edge of bed.    Transfers Overall transfer level: Needs assistance Equipment used: Rolling walker (2 wheeled) Transfers: Sit to/from [facility] Sit to Stand: Mod assist;From elevated surface;+2 physical assistance Stand pivot transfers: Total assist;+2 physical assistance;+2 safety/equipment       General transfer comment: Attempted to stand with walker and patient unable to power up. Used sara stedy - and patient did better with pulling himself up with mod x 2. Total assist for pivot to recliner. Able to sit in recliner with feet down, knees flexed approximately to 40 degrees during grooming task x 10 min.    Balance Overall balance assessment: Needs assistance Sitting-balance support: No upper extremity supported Sitting balance-Leahy Scale: Fair     Standing balance support: Bilateral upper extremity supported Standing balance-Leahy Scale: Poor Standing balance comment: reliant on external assist                           ADL either performed or assessed with clinical judgement   ADL Overall ADL's : Needs assistance/impaired     Grooming: Set up;Oral care;Wash/dry face;Brushing hair;Sitting Grooming Details (indicate cue type and reason): up in recliner to perform grooming tasks. Upper Body Bathing: Minimal assistance Upper Body Bathing Details (indicate cue type and reason): Needed assistance to wash R  arm pit reporting difficulty with R elbow due to pain.         Lower Body Dressing: Total assistance;Bed level Lower Body Dressing Details (indicate cue type and reason): To don socks                     Vision Patient Visual Report: No change from  baseline     Perception     Praxis      Cognition Arousal/Alertness: Awake/alert Behavior During Therapy: WFL for tasks assessed/performed Overall Cognitive Status: Within Functional Limits for tasks assessed                                          Exercises     Shoulder Instructions       General Comments      Pertinent Vitals/ Pain       Pain Assessment: Faces Faces Pain Scale: Hurts whole lot Pain Location: B knees Pain Descriptors / Indicators: Discomfort;Grimacing;Guarding Pain Intervention(s): Premedicated before session;Monitored during session;Limited activity within patient's tolerance  Home Living                                          Prior Functioning/Environment              Frequency  Min 2X/week        Progress Toward Goals  OT Goals(current goals can now be found in the care plan section)  Progress towards OT goals: Progressing toward goals  Acute Rehab OT Goals Patient Stated Goal: return to baseline OT Goal Formulation: With patient Time For Goal Achievement: 09/12/20 Potential to Achieve Goals: Good ADL Goals Additional ADL Goal #1: Patient will stand at sink to perform grooming task as evidence of improving activity tolerance  Plan Discharge plan remains appropriate    Co-evaluation          OT goals addressed during session: ADL's and self-care;Other (comment) (functional mobility)      AM-PAC OT "6 Clicks" Daily Activity     Outcome Measure   Help from another person eating meals?: A Little Help from another person taking care of personal grooming?: A Little Help from another person toileting, which includes using toliet, bedpan, or urinal?: Total Help from another person bathing (including washing, rinsing, drying)?: A Lot Help from another person to put on and taking off regular upper body clothing?: A Little Help from another person to put on and taking off regular lower body  clothing?: Total 6 Click Score: 13    End of Session Equipment Utilized During Treatment: Rolling walker (stedy)  OT Visit Diagnosis: Other abnormalities of gait and mobility (R26.89);Pain   Activity Tolerance Patient limited by pain   Patient Left in chair;with call bell/phone within reach   Nurse Communication Mobility status        Time: [id number] OT Time Calculation (min): 32 min  Charges: OT General Charges $OT Visit: 1 Visit OT Treatments $Self Care/Home Management : 23-37 mins  Tyeler Goedken, OTR/L [place]  Office [phone] Pager: [place] 08/29/2020, 3:41 PM

## 2020-08-30 LAB — COMPREHENSIVE METABOLIC PANEL
ALT: 38 U/L (ref 0–44)
AST: 23 U/L (ref 15–41)
Albumin: 1.6 g/dL — ABNORMAL LOW (ref 3.5–5.0)
Alkaline Phosphatase: 234 U/L — ABNORMAL HIGH (ref 38–126)
Anion gap: 8 (ref 5–15)
BUN: 15 mg/dL (ref 6–20)
CO2: 23 mmol/L (ref 22–32)
Calcium: 8 mg/dL — ABNORMAL LOW (ref 8.9–10.3)
Chloride: 99 mmol/L (ref 98–111)
Creatinine, Ser: 0.72 mg/dL (ref 0.61–1.24)
GFR, Estimated: >60 mL/min (ref >60)
Glucose, Bld: 234 mg/dL — ABNORMAL HIGH (ref 70–99)
Potassium: 3.2 mmol/L — ABNORMAL LOW (ref 3.5–5.1)
Sodium: 130 mmol/L — ABNORMAL LOW (ref 135–145)
Total Bilirubin: 0.6 mg/dL (ref 0.3–1.2)
Total Protein: 5.3 g/dL — ABNORMAL LOW (ref 6.5–8.1)

## 2020-08-30 LAB — CBC
HCT: 25.5 % — ABNORMAL LOW (ref 39.0–52.0)
Hemoglobin: 8.1 g/dL — ABNORMAL LOW (ref 13.0–17.0)
MCH: 29.8 pg (ref 26.0–34.0)
MCHC: 31.8 g/dL (ref 30.0–36.0)
MCV: 93.8 fL (ref 80.0–100.0)
Platelets: 444 10*3/uL — ABNORMAL HIGH (ref 150–400)
RBC: 2.72 MIL/uL — ABNORMAL LOW (ref 4.22–5.81)
RDW: 15.4 % (ref 11.5–15.5)
WBC: 9.5 10*3/uL (ref 4.0–10.5)
nRBC: 0 % (ref 0.0–0.2)

## 2020-08-30 LAB — GLUCOSE, CAPILLARY
Glucose-Capillary: 125 mg/dL — ABNORMAL HIGH (ref 70–99)
Glucose-Capillary: 146 mg/dL — ABNORMAL HIGH (ref 70–99)
Glucose-Capillary: 198 mg/dL — ABNORMAL HIGH (ref 70–99)
Glucose-Capillary: 220 mg/dL — ABNORMAL HIGH (ref 70–99)
Glucose-Capillary: 228 mg/dL — ABNORMAL HIGH (ref 70–99)
Glucose-Capillary: 271 mg/dL — ABNORMAL HIGH (ref 70–99)

## 2020-08-30 MED ORDER — CELECOXIB 200 MG PO CAPS
200.0000 mg | ORAL_CAPSULE | Freq: Every day | ORAL | Status: DC
  Administered 2020-08-30 – 2020-09-01 (×3): 200 mg via ORAL
  Filled 2020-08-30 (×3): qty 1

## 2020-08-30 NOTE — Progress Notes (Signed)
Patient ID: Roger Brennan, male   DOB: [birth year], 60 y.o.   MRN: [id number]  Due to this polyarthralgia with unknown etiology and response to Toradol thus far I started him on Celebrex.  Primary surgical team can determine if this remains safe for him to take due to GI related issues

## 2020-08-30 NOTE — Progress Notes (Addendum)
PROGRESS NOTE    Roger Brennan  [id number] DOB: 05/15/1960 DOA: 08/12/2020 PCP: [name], MD    Brief Narrative:  60 y.o. male with no significant past medical history. Presenting with abdominal pain. He was admitted to the surgical service for diverticulitis w/ perforation. He had a partial colectomy and colostomy placement. He developed an abdominal wall infection requiring debridement. His stay has been complicated by DVTs and acute blood loss anemia.    Over the last several days, the patient has developed hyponatremia. It was noted last night that his sodium was 124. This morning it is 128. Since his the beginning of his admission, he is 4.7L fluid positive per documentation. TRH was consulted for concern of fluid status and hyponatremia.  Assessment & Plan:   Active Problems:   Perforation of colon (HCC)   Severe sepsis (HCC)   Pressure injury of skin  Hyponatremia     -Sodium improved with IVF hydration      -sodium currently 130    - without neurologic symptoms   - Recent serum osm borderline low at 275, urine osm borderline low 410   - Cont mild fluid restriction    - cont to follow bmet trends  Diverticulitis w/ perforation Abdominal wall infection s/p ex lap/debridements RUE/RLE DVT Acute blood loss anemia Hyperlgycemia Polyarticular pain/swelling     - Orthopedic surgery is following, now on celebrex  Antimicrobials: Anti-infectives (From admission, onward)    Start     Dose/Rate Route Frequency Ordered Stop   08/15/20 1000  anidulafungin (ERAXIS) 100 mg in sodium chloride 0.9 % 100 mL IVPB        100 mg 78 mL/hr over 100 Minutes Intravenous Every 24 hours 08/14/20 1349 08/20/20 1326   08/14/20 1600  anidulafungin (ERAXIS) 200 mg in sodium chloride 0.9 % 200 mL IVPB        200 mg 78 mL/hr over 200 Minutes Intravenous  Once 08/14/20 1349 08/14/20 2000   08/14/20 1000  vancomycin (VANCOREADY) IVPB 1500 mg/300 mL  Status:  Discontinued        1,500  mg 150 mL/hr over 120 Minutes Intravenous Every 24 hours 08/13/20 1009 08/14/20 0901   08/13/20 1000  vancomycin (VANCOREADY) IVPB 1250 mg/250 mL  Status:  Discontinued        1,250 mg 166.7 mL/hr over 90 Minutes Intravenous Every 24 hours 08/12/20 1910 08/13/20 1009   08/12/20 2200  piperacillin-tazobactam (ZOSYN) IVPB 3.375 g  Status:  Discontinued        3.375 g 12.5 mL/hr over 240 Minutes Intravenous Every 8 hours 08/12/20 1904 08/12/20 1941   08/12/20 2000  clindamycin (CLEOCIN) IVPB 600 mg  Status:  Discontinued        600 mg 100 mL/hr over 30 Minutes Intravenous Every 8 hours 08/12/20 1757 08/17/20 0844   08/12/20 1830  piperacillin-tazobactam (ZOSYN) IVPB 3.375 g        3.375 g 12.5 mL/hr over 240 Minutes Intravenous Every 8 hours 08/12/20 1811 08/26/20 1829   08/12/20 1015  vancomycin (VANCOREADY) IVPB 1500 mg/300 mL        1,500 mg 150 mL/hr over 120 Minutes Intravenous  Once 08/12/20 1007 08/12/20 1330   08/12/20 1000  ceFEPIme (MAXIPIME) 2 g in sodium chloride 0.9 % 100 mL IVPB        2 g 200 mL/hr over 30 Minutes Intravenous  Once 08/12/20 1000 08/12/20 1121   08/12/20 1000  metroNIDAZOLE (FLAGYL) IVPB 500 mg  500 mg 100 mL/hr over 60 Minutes Intravenous  Once 08/12/20 1000 08/12/20 1226   08/12/20 1000  vancomycin (VANCOCIN) IVPB 1000 mg/200 mL premix  Status:  Discontinued        1,000 mg 200 mL/hr over 60 Minutes Intravenous  Once 08/12/20 1000 08/12/20 1007       Subjective: Without complaints this afternoon  Objective: Vitals:   08/29/20 1527 08/29/20 2100 08/30/20 0600 08/30/20 1417  BP: 123/80 108/70 117/73 135/80  Pulse: 97 86 93 (!) 101  Resp: '17 18 18   '$ Temp: (!) 97.5 F (36.4 C) 97.9 F (36.6 C) 98 F (36.7 C) 98 F (36.7 C)  TempSrc: Oral Oral Oral Oral  SpO2: 99% 98% 100% 99%  Weight:      Height:        Intake/Output Summary (Last 24 hours) at 08/30/2020 1630 Last data filed at 08/30/2020 1222 Gross per 24 hour  Intake 130 ml   Output 1610 ml  Net -1480 ml    Filed Weights   08/14/20 1420 08/15/20 0445 08/21/20 1336  Weight: 83.8 kg 86.1 kg 79.8 kg    Examination: General exam: Conversant, in no acute distress Respiratory system: normal chest rise, clear, no audible wheezing Cardiovascular system: regular rhythm, s1-s2 Gastrointestinal system: Nondistended, nontender, pos BS Central nervous system: No seizures, no tremors Extremities: No cyanosis, no joint deformities Skin: No rashes, no pallor Psychiatry: Affect normal // no auditory hallucinations   Data Reviewed: I have personally reviewed following labs and imaging studies  CBC: Recent Labs  Lab 08/26/20 1206 08/27/20 0630 08/28/20 0550 08/29/20 0726 08/30/20 0715  WBC 12.7* 13.7* 10.7* 9.5 9.5  HGB 8.5* 8.4* 8.7* 8.5* 8.1*  HCT 25.4* 25.3* 25.4* 26.2* 25.5*  MCV 92.4 92.0 93.0 92.6 93.8  PLT 417* 457* 451* 478* 444*    Basic Metabolic Panel: Recent Labs  Lab 08/26/20 1206 08/27/20 0630 08/28/20 0550 08/29/20 0726 08/30/20 0715  NA 129* 128* 130* 130* 130*  K 3.9 3.5 3.6 3.6 3.2*  CL 98 98 98 98 99  CO2 '23 24 24 24 23  '$ GLUCOSE 293* 186* 160* 150* 234*  BUN '18 15 18 16 15  '$ CREATININE 0.79 0.61 0.66 0.57* 0.72  CALCIUM 7.8* 7.9* 8.2* 8.0* 8.0*  PHOS 3.6  --   --   --   --     GFR: Estimated Creatinine Clearance: 101.4 mL/min (by C-G formula based on SCr of 0.72 mg/dL). Liver Function Tests: Recent Labs  Lab 08/26/20 1206 08/28/20 0550 08/30/20 0715  AST  --  29 23  ALT  --  44 38  ALKPHOS  --  290* 234*  BILITOT  --  0.8 0.6  PROT  --  5.6* 5.3*  ALBUMIN 1.7* 1.7* 1.6*    No results for input(s): LIPASE, AMYLASE in the last 168 hours. No results for input(s): AMMONIA in the last 168 hours. Coagulation Profile: No results for input(s): INR, PROTIME in the last 168 hours. Cardiac Enzymes: No results for input(s): CKTOTAL, CKMB, CKMBINDEX, TROPONINI in the last 168 hours. BNP (last 3 results) No results for  input(s): PROBNP in the last 8760 hours. HbA1C: No results for input(s): HGBA1C in the last 72 hours. CBG: Recent Labs  Lab 08/29/20 2041 08/30/20 0001 08/30/20 0350 08/30/20 0808 08/30/20 1148  GLUCAP 177* 220* 146* 228* 198*    Lipid Profile: No results for input(s): CHOL, HDL, LDLCALC, TRIG, CHOLHDL, LDLDIRECT in the last 72 hours. Thyroid Function Tests: No results for  input(s): TSH, T4TOTAL, FREET4, T3FREE, THYROIDAB in the last 72 hours. Anemia Panel: No results for input(s): VITAMINB12, FOLATE, FERRITIN, TIBC, IRON, RETICCTPCT in the last 72 hours. Sepsis Labs: No results for input(s): PROCALCITON, LATICACIDVEN in the last 168 hours.  Recent Results (from the past 240 hour(s))  Body fluid culture w Gram Stain     Status: None   Collection Time: 08/25/20  3:08 PM   Specimen: KNEE; Synovial Fluid  Result Value Ref Range Status   Specimen Description   Final    KNEE RIGHT Performed at [facility] [address] [place], [place] [address]    Special Requests NONE  Final   Gram Stain   Final    ABUNDANT WBC PRESENT, PREDOMINANTLY PMN NO ORGANISMS SEEN    Culture   Final    NO GROWTH Performed at [facility], [place] [address], [place], [place] [address]    Report Status 08/28/2020 FINAL  Final      Radiology Studies: No results found.  Scheduled Meds:  celecoxib  200 mg Oral Daily   Chlorhexidine Gluconate Cloth  6 each Topical Daily   enoxaparin (LOVENOX) injection  80 mg Subcutaneous Q12H   feeding supplement  1 Container Oral BID BM   feeding supplement  237 mL Oral BID BM   insulin aspart  0-15 Units Subcutaneous TID WC   mouth rinse  15 mL Mouth Rinse BID   methocarbamol  1,000 mg Oral TID   multivitamin with minerals  1 tablet Oral Daily   nutrition supplement (JUVEN)  1 packet Oral BID BM   pantoprazole  40 mg Oral BID   sodium chloride flush  10-40 mL Intracatheter Q12H   Continuous Infusions:     LOS: 18 days    Marylu Lund, MD [facility] Pager On Amion  If 7PM-7AM, please contact night-coverage 08/30/2020, 4:30 PM

## 2020-08-30 NOTE — Progress Notes (Signed)
15 Days Post-Op  Subjective: CC: Doing well. No abdominal pain, n/v. Having ostomy output.  Voiding. Tolerating diet.   Objective: Vital signs in last 24 hours: Temp:  [97.5 F (36.4 C)-98 F (36.7 C)] 98 F (36.7 C) (07/31 0600) Pulse Rate:  [86-97] 93 (07/31 0600) Resp:  [17-18] 18 (07/31 0600) BP: (108-123)/(70-80) 117/73 (07/31 0600) SpO2:  [98 %-100 %] 100 % (07/31 0600) Last BM Date: 08/29/20  Intake/Output from previous day: 07/30 0701 - 07/31 0700 In: -  Out: 1610 [Urine:1600; Stool:10] Intake/Output this shift: No intake/output data recorded.  PE: Gen:  Alert, NAD, pleasant Abd: Soft, ND, appropriately tender around wounds, +BS, colostomy bag with brown stool. Unable to visualize colostomy 2/2 stool. Drain SS. Wounds stable.  Psych: A&Ox3 Skin: no rashes noted, warm and dry    Lab Results:  Recent Labs    08/28/20 0550 08/29/20 0726  WBC 10.7* 9.5  HGB 8.7* 8.5*  HCT 25.4* 26.2*  PLT 451* 478*    BMET Recent Labs    08/28/20 0550 08/29/20 0726  NA 130* 130*  K 3.6 3.6  CL 98 98  CO2 24 24  GLUCOSE 160* 150*  BUN 18 16  CREATININE 0.66 0.57*  CALCIUM 8.2* 8.0*    PT/INR No results for input(s): LABPROT, INR in the last 72 hours. CMP     Component Value Date/Time   NA 130 (L) 08/29/2020 0726   K 3.6 08/29/2020 0726   CL 98 08/29/2020 0726   CO2 24 08/29/2020 0726   GLUCOSE 150 (H) 08/29/2020 0726   BUN 16 08/29/2020 0726   CREATININE 0.57 (L) 08/29/2020 0726   CALCIUM 8.0 (L) 08/29/2020 0726   PROT 5.6 (L) 08/28/2020 0550   ALBUMIN 1.7 (L) 08/28/2020 0550   AST 29 08/28/2020 0550   ALT 44 08/28/2020 0550   ALKPHOS 290 (H) 08/28/2020 0550   BILITOT 0.8 08/28/2020 0550   GFRNONAA >60 08/29/2020 0726   GFRAA SPECIMEN CONTAMINATED, UNABLE TO PERFORM TEST(S). 08/19/2020 1236   Lipase     Component Value Date/Time   LIPASE 18 08/12/2020 1010    Studies/Results: No results found.  Anti-infectives: Anti-infectives (From  admission, onward)    Start     Dose/Rate Route Frequency Ordered Stop   08/15/20 1000  anidulafungin (ERAXIS) 100 mg in sodium chloride 0.9 % 100 mL IVPB        100 mg 78 mL/hr over 100 Minutes Intravenous Every 24 hours 08/14/20 1349 08/20/20 1326   08/14/20 1600  anidulafungin (ERAXIS) 200 mg in sodium chloride 0.9 % 200 mL IVPB        200 mg 78 mL/hr over 200 Minutes Intravenous  Once 08/14/20 1349 08/14/20 2000   08/14/20 1000  vancomycin (VANCOREADY) IVPB 1500 mg/300 mL  Status:  Discontinued        1,500 mg 150 mL/hr over 120 Minutes Intravenous Every 24 hours 08/13/20 1009 08/14/20 0901   08/13/20 1000  vancomycin (VANCOREADY) IVPB 1250 mg/250 mL  Status:  Discontinued        1,250 mg 166.7 mL/hr over 90 Minutes Intravenous Every 24 hours 08/12/20 1910 08/13/20 1009   08/12/20 2200  piperacillin-tazobactam (ZOSYN) IVPB 3.375 g  Status:  Discontinued        3.375 g 12.5 mL/hr over 240 Minutes Intravenous Every 8 hours 08/12/20 1904 08/12/20 1941   08/12/20 2000  clindamycin (CLEOCIN) IVPB 600 mg  Status:  Discontinued        600 mg  100 mL/hr over 30 Minutes Intravenous Every 8 hours 08/12/20 1757 08/17/20 0844   08/12/20 1830  piperacillin-tazobactam (ZOSYN) IVPB 3.375 g        3.375 g 12.5 mL/hr over 240 Minutes Intravenous Every 8 hours 08/12/20 1811 08/26/20 1829   08/12/20 1015  vancomycin (VANCOREADY) IVPB 1500 mg/300 mL        1,500 mg 150 mL/hr over 120 Minutes Intravenous  Once 08/12/20 1007 08/12/20 1330   08/12/20 1000  ceFEPIme (MAXIPIME) 2 g in sodium chloride 0.9 % 100 mL IVPB        2 g 200 mL/hr over 30 Minutes Intravenous  Once 08/12/20 1000 08/12/20 1121   08/12/20 1000  metroNIDAZOLE (FLAGYL) IVPB 500 mg        500 mg 100 mL/hr over 60 Minutes Intravenous  Once 08/12/20 1000 08/12/20 1226   08/12/20 1000  vancomycin (VANCOCIN) IVPB 1000 mg/200 mL premix  Status:  Discontinued        1,000 mg 200 mL/hr over 60 Minutes Intravenous  Once 08/12/20 1000 08/12/20  1007        Assessment/Plan POD 18 s/p drainage and debridement of LLQ abdominal wall 15 x 20 cm, partial colectomy with end colostomy, Splenic mobilization by Dr. Kieth Brightly on 08/12/20 for abdominal wall abscess, diverticulitis POD 16 s/p reopening of recent laparotomy, lysis of adhesions for 45 min. Small bowel resection with anastomosis, enterrorhaphy x 2, debridement of 200 cm^2 of necrotic fat and fascia of abdominal wall by Dr. Kieth Brightly on 08/14/20 for intestinal injury POD 15 s/p Debridement of abdominal wall with above dimensions with sharp dissection and Pulsavac of wounds by Dr. Brantley Stage on 7/16 for Open abdominal wall wounds left flank, right lower quadrant total area 738 cm ^2  - The area left lower quadrants was 20 cm x 30 cm and midline 20 cm x 6 cm and right lower quadrant 6 x 3 cm - Path 7/13 w/ diverticulitis - no evidence of malignancy  - On soft diet. Nutrition following. - Colostomy functioning - Cont wound care. BID WTD.  - WOCN following. - Comp 7d Eraxis, on Zosyn and completed 14d.  - Cont drain. SS. Consider removing before d/c to CIR.  - Mobilize. PT/OT rec CIR - Pulm toilet   FEN - Soft diet. BID PPI. Appreciate TRH assistance w/ fluid and electrolyte balance VTE - SCDs, therapeutic Lovenox ID - Abx completed. WBC normal.  Afebrile.  Foley - Out, voiding Dispo - CIR following. Cleared for discharge to CIR when bed available    Hyponatremia - appreciate TRH assistance. Improving  RUE/RLE DVT - Lovenox  ABL anemia - Hgb stable . Hyperglycemia - stable. SSI Protein Calorie Malnutrition - Pre-alb < 5 -> 11.4. TPN off, on diet. Nutrition following for supplements.  L elbow, R elbow, and R knee pain/swelling - Appreciate Dr. Aurea Graff assistance. S/p aspiration of R knee. On Toradol and now celebrex   LOS: 18 days   Rosario Adie, MD  [facility] [place]

## 2020-08-31 ENCOUNTER — Other Ambulatory Visit (HOSPITAL_COMMUNITY): Payer: Self-pay

## 2020-08-31 DIAGNOSIS — R6520 Severe sepsis without septic shock: Secondary | ICD-10-CM | POA: Diagnosis not present

## 2020-08-31 DIAGNOSIS — R1030 Lower abdominal pain, unspecified: Secondary | ICD-10-CM | POA: Diagnosis not present

## 2020-08-31 DIAGNOSIS — A419 Sepsis, unspecified organism: Secondary | ICD-10-CM | POA: Diagnosis not present

## 2020-08-31 LAB — CBC
HCT: 26.9 % — ABNORMAL LOW (ref 39.0–52.0)
Hemoglobin: 8.5 g/dL — ABNORMAL LOW (ref 13.0–17.0)
MCH: 29.6 pg (ref 26.0–34.0)
MCHC: 31.6 g/dL (ref 30.0–36.0)
MCV: 93.7 fL (ref 80.0–100.0)
Platelets: 506 10*3/uL — ABNORMAL HIGH (ref 150–400)
RBC: 2.87 MIL/uL — ABNORMAL LOW (ref 4.22–5.81)
RDW: 15.2 % (ref 11.5–15.5)
WBC: 8.3 10*3/uL (ref 4.0–10.5)
nRBC: 0 % (ref 0.0–0.2)

## 2020-08-31 LAB — GLUCOSE, CAPILLARY
Glucose-Capillary: 128 mg/dL — ABNORMAL HIGH (ref 70–99)
Glucose-Capillary: 256 mg/dL — ABNORMAL HIGH (ref 70–99)
Glucose-Capillary: 148 mg/dL — ABNORMAL HIGH (ref 70–99)
Glucose-Capillary: 221 mg/dL — ABNORMAL HIGH (ref 70–99)

## 2020-08-31 LAB — BASIC METABOLIC PANEL
Anion gap: 7 (ref 5–15)
BUN: 18 mg/dL (ref 6–20)
CO2: 26 mmol/L (ref 22–32)
Calcium: 8.2 mg/dL — ABNORMAL LOW (ref 8.9–10.3)
Chloride: 98 mmol/L (ref 98–111)
Creatinine, Ser: 0.65 mg/dL (ref 0.61–1.24)
GFR, Estimated: >60 mL/min (ref >60)
Glucose, Bld: 162 mg/dL — ABNORMAL HIGH (ref 70–99)
Potassium: 3.7 mmol/L (ref 3.5–5.1)
Sodium: 131 mmol/L — ABNORMAL LOW (ref 135–145)

## 2020-08-31 MED ORDER — APIXABAN 5 MG PO TABS
5.0000 mg | ORAL_TABLET | Freq: Two times a day (BID) | ORAL | Status: DC
  Administered 2020-08-31 – 2020-09-01 (×2): 5 mg via ORAL
  Filled 2020-08-31 (×3): qty 1

## 2020-08-31 NOTE — Progress Notes (Signed)
Physical Therapy Treatment Patient Details Name: Roger Brennan MRN: [id number] DOB: Mar 08, 1960 Today's Date: 08/31/2020    History of Present Illness Patient is 60 yo  old male who presented to [facility]/12/20 at the direction of PCP with LLQ abdominal pain and induration for the last 2-3 weeks, 20 pound weight loss.  CT-Colon perforation with abscess and fistulization to abdominal wall .  S/P drainage and debridement of left lower quadrant abdominal wall ,. partial colectomy with end colostomy , Splenic mobilization on 08/12/20 emergently. 7/15 intestinal drainage from the wound >> emergent exploration; recurrent peritonitis, LOA ,Small bowel resection with anastomosis, enterrorhaphy x 2, debridement of 200 cm^2 of necrotic fat and fascia of abdominal wall; 7/16 debridement of abdominal wall. Pt also developed polyarthralgia with unknown etiology.    PT Comments    Pt medicated with dilaudid 10 min prior to PT/OT session. Instructed pt in R knee flexion AAROM exercises as well and R gastroc stretch to be done independently using gait belt looped on R foot. R knee flexion AAROM ~35*, limited by pain. Min assist for rolling and sidelying to sit. +2 mod A sit to stand with RW. Pt initially had flexed trunk posture but eventually was able to come to nearly full upright position. Performed marching in standing then pt took several pivotal steps to recliner. HR 117 with activity. Pain R knee 1-2/10. Overall improved activity tolerance today. Pt puts forth good effort.   Follow Up Recommendations  CIR     Equipment Recommendations  Rolling walker with 5" wheels    Recommendations for Other Services       Precautions / Restrictions Precautions Precautions: Fall Precaution Comments: colostomy, R JP drain, bandages over full abdomen, Restrictions Weight Bearing Restrictions: No    Mobility  Bed Mobility Overal bed mobility: Needs Assistance   Rolling: Min assist Sidelying to sit: Min assist        General bed mobility comments: min A to support RLE (painful when flexed more than ~30*), VCs technique for roll and sidelying to sit, used bedrail, used gait belt looped on R foot as leg lifter    Transfers Overall transfer level: Needs assistance Equipment used: Rolling walker (2 wheeled) Transfers: Sit to/from [facility] Sit to Stand: +2 physical assistance;Mod assist;From elevated surface Stand pivot transfers: Min assist;+2 physical assistance;+2 safety/equipment       General transfer comment: VCs hand placement, mod A to power up, pt performed marching in place x 5 BLEs then took several pivotal steps to recliner. Trunk flexed throughout but able to come to ~15* from full upright position with increased time.  Ambulation/Gait                 Stairs             Wheelchair Mobility    Modified Rankin (Stroke Patients Only)       Balance Overall balance assessment: Needs assistance Sitting-balance support: No upper extremity supported Sitting balance-Leahy Scale: Fair     Standing balance support: Bilateral upper extremity supported Standing balance-Leahy Scale: Poor Standing balance comment: reliant on external assist                            Cognition Arousal/Alertness: Awake/alert Behavior During Therapy: WFL for tasks assessed/performed Overall Cognitive Status: Within Functional Limits for tasks assessed  Exercises Total Joint Exercises Knee Flexion: AAROM;Right;15 reps;Seated (washcloth under R foot, self assisted R knee flexion with LLE crossed over RLE) General Exercises - Lower Extremity Ankle Circles/Pumps: AROM;Both;Supine;15 reps Heel Slides: AAROM;Right;10 reps;Supine Other Exercises Other Exercises: R gastroc stretch using gait belt looped on R foot; 30 sec hold x 3    General Comments        Pertinent Vitals/Pain Pain Score: 2  Pain  Location: R knee with flexion Pain Descriptors / Indicators: Discomfort;Grimacing;Guarding Pain Intervention(s): Limited activity within patient's tolerance;Monitored during session;Premedicated before session;Repositioned    Home Living                      Prior Function            PT Goals (current goals can now be found in the care plan section) Acute Rehab PT Goals Patient Stated Goal: return to baseline PT Goal Formulation: With patient Time For Goal Achievement: 09/14/20 Potential to Achieve Goals: Good Progress towards PT goals: Progressing toward goals    Frequency    Min 3X/week      PT Plan Current plan remains appropriate    Co-evaluation PT/OT/SLP Co-Evaluation/Treatment: Yes Reason for Co-Treatment: Complexity of the patient's impairments (multi-system involvement);To address functional/ADL transfers;For patient/therapist safety PT goals addressed during session: Mobility/safety with mobility;Balance;Proper use of DME;Strengthening/ROM        AM-PAC PT "6 Clicks" Mobility   Outcome Measure  Help needed turning from your back to your side while in a flat bed without using bedrails?: A Little Help needed moving from lying on your back to sitting on the side of a flat bed without using bedrails?: A Lot Help needed moving to and from a bed to a chair (including a wheelchair)?: A Lot Help needed standing up from a chair using your arms (e.g., wheelchair or bedside chair)?: A Lot Help needed to walk in hospital room?: Total Help needed climbing 3-5 steps with a railing? : Total 6 Click Score: 11    End of Session Equipment Utilized During Treatment: Gait belt Activity Tolerance: Patient limited by fatigue Patient left: with call bell/phone within reach;with family/visitor present;in chair;with chair alarm set Nurse Communication: Mobility status PT Visit Diagnosis: Unsteadiness on feet (R26.81);Difficulty in walking, not elsewhere classified  (R26.2);Muscle weakness (generalized) (M62.81);Pain Pain - Right/Left: Right Pain - part of body: Knee     Time: [id number] PT Time Calculation (min) (ACUTE ONLY): 24 min  Charges:  $Therapeutic Activity: 8-22 mins                    Blondell Reveal Kistler PT 08/31/2020  Acute Rehabilitation Services Pager [phone] Office [phone]

## 2020-08-31 NOTE — Progress Notes (Signed)
PROGRESS NOTE    Roger Brennan  [id number] DOB: [birth year] DOA: 08/12/2020 PCP: [name], MD    Brief Narrative:  60 y.o. male with no significant past medical history. Presenting with abdominal pain. He was admitted to the surgical service for diverticulitis w/ perforation. He had a partial colectomy and colostomy placement. He developed an abdominal wall infection requiring debridement. His stay has been complicated by DVTs and acute blood loss anemia.    Over the last several days, the patient has developed hyponatremia. It was noted last night that his sodium was 124. This morning it is 128. Since his the beginning of his admission, he is 4.7L fluid positive per documentation. TRH was consulted for concern of fluid status and hyponatremia.  Assessment & Plan:   Active Problems:   Perforation of colon (HCC)   Severe sepsis (HCC)   Pressure injury of skin  Hyponatremia     -Sodium improved with IVF hydration      -sodium currently 131    - without neurologic symptoms   - Recent serum osm borderline low at 275, urine osm borderline low 410   - Would continue mild fluid restriction/avoid over hydration  Diverticulitis w/ perforation Abdominal wall infection s/p ex lap/debridements RUE/RLE DVT Acute blood loss anemia Hyperlgycemia Polyarticular pain/swelling     - Orthopedic surgery is following, now on celebrex  As pt is clinically improving with sodium improving, will sign off for now. Thank you for allowing me to be involved in this patient's care. Please do not hesitate to call if there are any questions  Antimicrobials: Anti-infectives (From admission, onward)    Start     Dose/Rate Route Frequency Ordered Stop   08/15/20 1000  anidulafungin (ERAXIS) 100 mg in sodium chloride 0.9 % 100 mL IVPB        100 mg 78 mL/hr over 100 Minutes Intravenous Every 24 hours 08/14/20 1349 08/20/20 1326   08/14/20 1600  anidulafungin (ERAXIS) 200 mg in sodium chloride 0.9 %  200 mL IVPB        200 mg 78 mL/hr over 200 Minutes Intravenous  Once 08/14/20 1349 08/14/20 2000   08/14/20 1000  vancomycin (VANCOREADY) IVPB 1500 mg/300 mL  Status:  Discontinued        1,500 mg 150 mL/hr over 120 Minutes Intravenous Every 24 hours 08/13/20 1009 08/14/20 0901   08/13/20 1000  vancomycin (VANCOREADY) IVPB 1250 mg/250 mL  Status:  Discontinued        1,250 mg 166.7 mL/hr over 90 Minutes Intravenous Every 24 hours 08/12/20 1910 08/13/20 1009   08/12/20 2200  piperacillin-tazobactam (ZOSYN) IVPB 3.375 g  Status:  Discontinued        3.375 g 12.5 mL/hr over 240 Minutes Intravenous Every 8 hours 08/12/20 1904 08/12/20 1941   08/12/20 2000  clindamycin (CLEOCIN) IVPB 600 mg  Status:  Discontinued        600 mg 100 mL/hr over 30 Minutes Intravenous Every 8 hours 08/12/20 1757 08/17/20 0844   08/12/20 1830  piperacillin-tazobactam (ZOSYN) IVPB 3.375 g        3.375 g 12.5 mL/hr over 240 Minutes Intravenous Every 8 hours 08/12/20 1811 08/26/20 1829   08/12/20 1015  vancomycin (VANCOREADY) IVPB 1500 mg/300 mL        1,500 mg 150 mL/hr over 120 Minutes Intravenous  Once 08/12/20 1007 08/12/20 1330   08/12/20 1000  ceFEPIme (MAXIPIME) 2 g in sodium chloride 0.9 % 100 mL IVPB  2 g 200 mL/hr over 30 Minutes Intravenous  Once 08/12/20 1000 08/12/20 1121   08/12/20 1000  metroNIDAZOLE (FLAGYL) IVPB 500 mg        500 mg 100 mL/hr over 60 Minutes Intravenous  Once 08/12/20 1000 08/12/20 1226   08/12/20 1000  vancomycin (VANCOCIN) IVPB 1000 mg/200 mL premix  Status:  Discontinued        1,000 mg 200 mL/hr over 60 Minutes Intravenous  Once 08/12/20 1000 08/12/20 1007       Subjective: Without complaints  Objective: Vitals:   08/31/20 0605 08/31/20 0608 08/31/20 0650 08/31/20 1212  BP:  136/87  137/82  Pulse: 91 91  (!) 107  Resp:  18  17  Temp:  98.1 F (36.7 C)  98 F (36.7 C)  TempSrc:  Oral  Oral  SpO2: 100% 100%  100%  Weight:   80.7 kg   Height:         Intake/Output Summary (Last 24 hours) at 08/31/2020 1853 Last data filed at 08/31/2020 1300 Gross per 24 hour  Intake 490 ml  Output 1650 ml  Net -1160 ml    Filed Weights   08/15/20 0445 08/21/20 1336 08/31/20 0650  Weight: 86.1 kg 79.8 kg 80.7 kg    Examination: General exam: Awake, laying in bed, in nad Respiratory system: Normal respiratory effort, no wheezing Cardiovascular system: regular rate, s1, s2 Gastrointestinal system: Soft, nondistended, positive BS Central nervous system: CN2-12 grossly intact, strength intact Extremities: Perfused, no clubbing Skin: Normal skin turgor, no notable skin lesions seen Psychiatry: Mood normal // no visual hallucinations   Data Reviewed: I have personally reviewed following labs and imaging studies  CBC: Recent Labs  Lab 08/27/20 0630 08/28/20 0550 08/29/20 0726 08/30/20 0715 08/31/20 0514  WBC 13.7* 10.7* 9.5 9.5 8.3  HGB 8.4* 8.7* 8.5* 8.1* 8.5*  HCT 25.3* 25.4* 26.2* 25.5* 26.9*  MCV 92.0 93.0 92.6 93.8 93.7  PLT 457* 451* 478* 444* 506*    Basic Metabolic Panel: Recent Labs  Lab 08/26/20 1206 08/27/20 0630 08/28/20 0550 08/29/20 0726 08/30/20 0715 08/31/20 0514  NA 129* 128* 130* 130* 130* 131*  K 3.9 3.5 3.6 3.6 3.2* 3.7  CL 98 98 98 98 99 98  CO2 '23 24 24 24 23 26  '$ GLUCOSE 293* 186* 160* 150* 234* 162*  BUN '18 15 18 16 15 18  '$ CREATININE 0.79 0.61 0.66 0.57* 0.72 0.65  CALCIUM 7.8* 7.9* 8.2* 8.0* 8.0* 8.2*  PHOS 3.6  --   --   --   --   --     GFR: Estimated Creatinine Clearance: 101.4 mL/min (by C-G formula based on SCr of 0.65 mg/dL). Liver Function Tests: Recent Labs  Lab 08/26/20 1206 08/28/20 0550 08/30/20 0715  AST  --  29 23  ALT  --  44 38  ALKPHOS  --  290* 234*  BILITOT  --  0.8 0.6  PROT  --  5.6* 5.3*  ALBUMIN 1.7* 1.7* 1.6*    No results for input(s): LIPASE, AMYLASE in the last 168 hours. No results for input(s): AMMONIA in the last 168 hours. Coagulation Profile: No results  for input(s): INR, PROTIME in the last 168 hours. Cardiac Enzymes: No results for input(s): CKTOTAL, CKMB, CKMBINDEX, TROPONINI in the last 168 hours. BNP (last 3 results) No results for input(s): PROBNP in the last 8760 hours. HbA1C: No results for input(s): HGBA1C in the last 72 hours. CBG: Recent Labs  Lab 08/30/20 1807 08/30/20 2112  08/31/20 0721 08/31/20 1210 08/31/20 1712  GLUCAP 271* 125* 128* 256* 148*    Lipid Profile: No results for input(s): CHOL, HDL, LDLCALC, TRIG, CHOLHDL, LDLDIRECT in the last 72 hours. Thyroid Function Tests: No results for input(s): TSH, T4TOTAL, FREET4, T3FREE, THYROIDAB in the last 72 hours. Anemia Panel: No results for input(s): VITAMINB12, FOLATE, FERRITIN, TIBC, IRON, RETICCTPCT in the last 72 hours. Sepsis Labs: No results for input(s): PROCALCITON, LATICACIDVEN in the last 168 hours.  Recent Results (from the past 240 hour(s))  Body fluid culture w Gram Stain     Status: None   Collection Time: 08/25/20  3:08 PM   Specimen: KNEE; Synovial Fluid  Result Value Ref Range Status   Specimen Description   Final    KNEE RIGHT Performed at [facility] [address], [place], [place] [address]    Special Requests NONE  Final   Gram Stain   Final    ABUNDANT WBC PRESENT, PREDOMINANTLY PMN NO ORGANISMS SEEN    Culture   Final    NO GROWTH Performed at [facility], [place] [address], [place], [place] [address]    Report Status 08/28/2020 FINAL  Final      Radiology Studies: No results found.  Scheduled Meds:  apixaban  5 mg Oral BID   celecoxib  200 mg Oral Daily   Chlorhexidine Gluconate Cloth  6 each Topical Daily   feeding supplement  1 Container Oral BID BM   feeding supplement  237 mL Oral BID BM   insulin aspart  0-15 Units Subcutaneous TID WC   mouth rinse  15 mL Mouth Rinse BID   methocarbamol  1,000 mg Oral TID   multivitamin with minerals  1 tablet Oral Daily   nutrition supplement (JUVEN)   1 packet Oral BID BM   pantoprazole  40 mg Oral BID   sodium chloride flush  10-40 mL Intracatheter Q12H   Continuous Infusions:     LOS: 19 days   Marylu Lund, MD [facility] Pager On Amion  If 7PM-7AM, please contact night-coverage 08/31/2020, 6:53 PM

## 2020-08-31 NOTE — Progress Notes (Signed)
[place] for enoxaparin Indication: DVT  No Known Allergies  Patient Measurements: Height: '5\' 10"'$  (177.8 cm) Weight: 80.7 kg (177 lb 14.6 oz) IBW/kg (Calculated) : 73  Vital Signs: Temp: 98.1 F (36.7 C) (08/01 0608) Temp Source: Oral (08/01 0608) BP: 136/87 (08/01 0608) Pulse Rate: 91 (08/01 0608)  Labs: Recent Labs    08/29/20 0726 08/30/20 0715 08/31/20 0514  HGB 8.5* 8.1* 8.5*  HCT 26.2* 25.5* 26.9*  PLT 478* 444* 506*  CREATININE 0.57* 0.72 0.65    Assessment: 60 yo M w/ DVT. Had been holding anticoagulation due to ABL anemia. Pharmacy was consulted to restart anticoagulation with Enoxaparin on 7/28.   Today, 08/31/2020: SCr stable, CrCl > 30 ml/min CBC:  Hgb remains low/stable at 8.5, Plt elevated.   Plan:  Continue enoxaparin '80mg'$  [place] Q12h Monitor CBC, s/s of bleed  Gretta Arab PharmD, BCPS Clinical Pharmacist [facility] main pharmacy [phone] 08/31/2020 7:26 AM

## 2020-08-31 NOTE — TOC Progression Note (Addendum)
Transition of Care [facility]) - Progression Note    Patient Details  Name: Roger Brennan MRN: [id number] Date of Birth: 07/22/1960  [facility] CM/SW Contact  Leeroy Cha, RN Phone Number: 08/31/2020, 11:18 AM  Clinical Narrative:    Spoke to wife and patient.  Both would like for the patient to go to CIR post hospitalization for rehab. Explained the difference between snf rehab and the [facility] Inpatient Re[facility].  And that he will have qualify for either.Will see what P.T. services recommends.CIR per the previous set of notes is following.    Expected Discharge Plan: Home/Self Care Barriers to Discharge: No [place] will accept this patient  Expected Discharge Plan and Services Expected Discharge Plan: Home/Self Care   Discharge Planning Services: CM Consult   Living arrangements for the past 2 months: Single Family Home                                       Social Determinants of Health (SDOH) Interventions    Readmission Risk Interventions No flowsheet data found.

## 2020-08-31 NOTE — Progress Notes (Signed)
.  Inpatient Rehab Admissions Coordinator:    I am opening a case for insurance auth for CIR today and anticipate a decision sometime tomorrow. I updated pt. And his wife.   Clemens Catholic, [place], [place] Admissions Coordinator  [phone] ([place]) [phone] (office)

## 2020-08-31 NOTE — Discharge Instructions (Addendum)
[facility]      [facility] [phone]  OPEN ABDOMINAL SURGERY: POST OP INSTRUCTIONS  Always review your discharge instruction sheet given to you by the facility where your surgery was performed.  IF YOU HAVE DISABILITY OR FAMILY LEAVE FORMS, YOU MUST BRING THEM TO THE OFFICE FOR PROCESSING.  PLEASE DO NOT GIVE THEM TO YOUR DOCTOR.  A prescription for pain medication may be given to you upon discharge.  Take your pain medication as prescribed, if needed.  If narcotic pain medicine is not needed, then you may take acetaminophen (Tylenol) or ibuprofen (Advil) as needed. Take your usually prescribed medications unless otherwise directed. If you need a refill on your pain medication, please contact your pharmacy. They will contact our office to request authorization.  Prescriptions will not be filled after 5pm or on week-ends. You should follow a light diet the first few days after arrival home, such as soup and crackers, pudding, etc.unless your doctor has advised otherwise. A high-fiber, low fat diet can be resumed as tolerated.   Be sure to include lots of fluids daily. Most patients will experience some swelling and bruising on the chest and neck area.  Ice packs will help.  Swelling and bruising can take several days to resolve Most patients will experience some swelling and bruising in the area of the incision. Ice pack will help. Swelling and bruising can take several days to resolve..  It is common to experience some constipation if taking pain medication after surgery.  Increasing fluid intake and taking a stool softener will usually help or prevent this problem from occurring.  A mild laxative (Milk of Magnesia or Miralax) should be taken according to package directions if there are no bowel movements after 48 hours.  You may have steri-strips (small skin tapes) in place directly over the incision.  These strips should be left on the skin for 7-10 days.  If your surgeon used skin  glue on the incision, you may shower in 24 hours.  The glue will flake off over the next 2-3 weeks.  Any sutures or staples will be removed at the office during your follow-up visit. You may find that a light gauze bandage over your incision may keep your staples from being rubbed or pulled. You may shower and replace the bandage daily. ACTIVITIES:  You may resume regular (light) daily activities beginning the next day--such as daily self-care, walking, climbing stairs--gradually increasing activities as tolerated.  You may have sexual intercourse when it is comfortable.  Refrain from any heavy lifting or straining until approved by your doctor. You may drive when you no longer are taking prescription pain medication, you can comfortably wear a seatbelt, and you can safely maneuver your car and apply brakes Return to Work: ___________________________________ You should see your doctor in the office for a follow-up appointment approximately two weeks after your surgery.  Make sure that you call for this appointment within a day or two after you arrive home to insure a convenient appointment time. OTHER INSTRUCTIONS:  _____________________________________________________________ _____________________________________________________________  WHEN TO CALL YOUR DOCTOR: Fever over 101.0 Inability to urinate Nausea and/or vomiting Extreme swelling or bruising Continued bleeding from incision. Increased pain, redness, or drainage from the incision. Difficulty swallowing or breathing Muscle cramping or spasms. Numbness or tingling in hands or feet or around lips.  The clinic staff is available to answer your questions during regular business hours.  Please don't hesitate to call and ask to speak to one of   the nurses if you have concerns.  For further questions, please visit [email or web address]  Wet to Dry WOUND CARE: - Change dressing twice daily - Supplies: sterile saline, kerlex,  scissors, ABD pads, tape  Remove dressing and all packing carefully, moistening with sterile saline as needed to avoid packing/internal dressing sticking to the wound. 2.   Clean edges of skin around the wound with water/gauze, making sure there is no tape debris or leakage left on skin that could cause skin irritation or breakdown. 3.   Dampen and clean kerlex with sterile saline and pack wound from wound base to skin level, making sure to take note of any possible areas of wound tracking, tunneling and packing appropriately. Wound can be packed loosely. Trim kerlex to size if a whole kerlex is not required. 4.   Cover wound with a dry ABD pad and secure with tape.  5.   Write the date/time on the dry dressing/tape to better track when the last dressing change occurred. - apply any skin protectant/powder if recommended by clinician to protect skin/skin folds. - change dressing as needed if leakage occurs, wound gets contaminated, or patient requests to shower. - You may shower daily with wound open and following the shower the wound should be dried and a clean dressing placed.  - Medical grade tape as well as packing supplies can be found at [employer] on [address] or [employer] on [address]. The remaining supplies can be found at your local drug store, [place] etc.   Information on my medicine - ELIQUIS (apixaban)  This medication education was reviewed with me or my healthcare representative as part of my discharge preparation.  The pharmacist that spoke with me during my hospital stay was: Altha Harm  Why was Eliquis prescribed for you? Eliquis was prescribed to treat blood clots that may have been found in the veins of your legs (deep vein thrombosis) or in your lungs (pulmonary embolism) and to reduce the risk of them occurring again.  What do You need to know about Eliquis ? The dose is ONE 5 mg tablet taken TWICE daily.  Eliquis may be taken with or  without food.   Try to take the dose about the same time in the morning and in the evening. If you have difficulty swallowing the tablet whole please discuss with your pharmacist how to take the medication safely.  Take Eliquis exactly as prescribed and DO NOT stop taking Eliquis without talking to the doctor who prescribed the medication.  Stopping may increase your risk of developing a new blood clot.  Refill your prescription before you run out.  After discharge, you should have regular check-up appointments with your healthcare provider that is prescribing your Eliquis.    What do you do if you miss a dose? If a dose of ELIQUIS is not taken at the scheduled time, take it as soon as possible on the same day and twice-daily administration should be resumed. The dose should not be doubled to make up for a missed dose.  Important Safety Information A possible side effect of Eliquis is bleeding. You should call your healthcare provider right away if you experience any of the following: Bleeding from an injury or your nose that does not stop. Unusual colored urine (red or dark brown) or unusual colored stools (red or black). Unusual bruising for unknown reasons. A serious fall or if you hit your head (even if there is no bleeding).  Some medicines may interact  with Eliquis and might increase your risk of bleeding or clotting while on Eliquis. To help avoid this, consult your healthcare provider or pharmacist prior to using any new prescription or non-prescription medications, including herbals, vitamins, non-steroidal anti-inflammatory drugs (NSAIDs) and supplements.  This website has more information on Eliquis (apixaban): http://www.eliquis.com/eliquis/home

## 2020-08-31 NOTE — Progress Notes (Signed)
Inpatient Rehab Admissions Coordinator:   Note that surgeon feels Pt. Ready for CIR. I will need updated therapy notes to file a case with Pt.'s insurer. I have reached out to acute rehab to see if OT and PT can see Pt. Today.   Clemens Catholic, [place], [place] Admissions Coordinator  [phone] ([place]) [phone] (office)

## 2020-08-31 NOTE — TOC Benefit Eligibility Note (Signed)
Patient Teacher, English as a foreign language completed.    The patient is currently admitted and upon discharge coud be taking Xarelto 20 mg.  Requires Prior Authorization.   The patient is currently admitted and upon discharge coud be taking Eliquis 5 mg.  Requires Prior Authorization.   The patient is insured through [place], [place] Patient Advocate Specialist [place] Team Direct Number: [phone]  Fax: [phone]

## 2020-08-31 NOTE — TOC Benefit Eligibility Note (Signed)
Patient Advocate Encounter  Prior Authorization for Eliquis 5 mg has been approved.    PA# [id number] Effective dates: 08/31/2020 through 08/31/2021  Patients co-pay is $50.00.     Lyndel Safe, [place] Patient Advocate Specialist [place] Antimicrobial Stewardship Team Direct Number: [phone]  Fax: [phone]

## 2020-08-31 NOTE — Consult Note (Signed)
[place] Nurse ostomy follow up Patient receiving care in [place] [facility]. Spouse at bedside. Stoma type/location: LUQ colostomy. Pouch leaked last night and was changed. Therefore, pouch not changed at this time. The spouse was able to correctly state all the steps of emptying/changing the pouching system.    I suspect he may need convexity in the near future, but for now I will order additional 2 pc 2 3/4 inch components.  Some supplies at bedside.  Patient wants to go to [facility] inpatient rehab.  I explained that our goal is to have him be as independent in ostomy care as possible.  He and his spouse verbalized their understanding. Val Riles, RN, MSN, CWOCN, CNS-BC, pager [phone]

## 2020-08-31 NOTE — Progress Notes (Signed)
Occupational Therapy Treatment Patient Details Name: Roger Brennan MRN: [id number] DOB: 1960-05-13 Today's Date: 08/31/2020    History of present illness Patient is 60 yo  old male who presented to [facility] 08/12/20 at the direction of PCP with LLQ abdominal pain and induration for the last 2-3 weeks, 20 pound weight loss.  CT-Colon perforation with abscess and fistulization to abdominal wall .  S/P drainage and debridement of left lower quadrant abdominal wall ,. partial colectomy with end colostomy , Splenic mobilization on 08/12/20 emergently. 7/15 intestinal drainage from the wound >> emergent exploration; recurrent peritonitis, LOA ,Small bowel resection with anastomosis, enterrorhaphy x 2, debridement of 200 cm^2 of necrotic fat and fascia of abdominal wall; 7/16 debridement of abdominal wall. Pt also developed polyarthralgia with unknown etiology.   OT comments  Patient progressing and showed improved ability to stand to RW compared to previous session where pt required Denna Haggard to stand. Pt also making gains with pre-ADL activities of bed mobility and pivoting to recliner with minimal assist for bed and moderate assist of 2 people for standing tasks. Patient remains limited by decreased activity tolerance rating his effort from bed to chair as 10/10 on RPE, along continued pain to elbows, knees and ankles/feet RT>LT, as well as with deficits noted below. Pt continues to demonstrate good rehab potential and is highly motivated. Pt would benefit from continued skilled OT to increase safety and independence with ADLs and functional transfers to allow pt to return home safely and reduce caregiver burden and fall risk.   Follow Up Recommendations  CIR    Equipment Recommendations  Tub/shower seat;3 in 1 bedside commode    Recommendations for Other Services Rehab consult    Precautions / Restrictions Precautions Precautions: Fall Precaution Comments: colostomy, R JP drain, bandages over full  abdomen, Restrictions Weight Bearing Restrictions: No       Mobility Bed Mobility Overal bed mobility: Needs Assistance Bed Mobility: Supine to Sit Rolling: Min assist Sidelying to sit: Min assist       General bed mobility comments: min A to support RLE (painful when flexed more than ~30*), VCs technique for roll and sidelying to sit, used bedrail, used gait belt looped on R foot as leg lifter    Transfers Overall transfer level: Needs assistance Equipment used: Rolling walker (2 wheeled) Transfers: Sit to/from [facility] Sit to Stand: +2 physical assistance;Mod assist;From elevated surface Stand pivot transfers: Min assist;+2 physical assistance;+2 safety/equipment       General transfer comment: VCs hand placement, mod A to power up, pt performed marching in place x 5 BLEs then took several pivotal steps to recliner. Trunk flexed throughout but able to come to ~15* from full upright position with increased time/cues.    Balance Overall balance assessment: Needs assistance Sitting-balance support: No upper extremity supported Sitting balance-Leahy Scale: Fair Sitting balance - Comments: Pt remains guarded with EOB sitting and with decreased Bil knee flexion to support sitting balance.   Standing balance support: Bilateral upper extremity supported Standing balance-Leahy Scale: Poor Standing balance comment: reliant on external assist                           ADL either performed or assessed with clinical judgement   ADL                       Lower Body Dressing: Total assistance;Sitting/lateral leans Lower Body Dressing Details (indicate cue type  and reason): Total As to don socks. Toilet Transfer: RW;BSC;Moderate assistance;+2 for physical assistance Toilet Transfer Details (indicate cue type and reason): Edge of bed elevated, able to power up to standing on first attempt this session with moderate assist of 2. Needs cues for  hand and foot placement and for anterior weight shift. Once standing increased time needed to allow pt to accomplish upright posture and maximum assist to negotiate RW in correct proximity for safety and leverage.  Pt then able to pivot to recliner (simulated in prep for [facility] transfers) with increased time/effort, multimodal cues and Moderate assist of 2 people.  Pt lowered onto recliner with moderate assist of 2 with 2nd person needed to assist pt's RT foot forward prior to sitting to control RT knee pain.         Functional mobility during ADLs: Moderate assistance;+2 for physical assistance;Rolling walker;Cueing for sequencing;Cueing for safety General ADL Comments: Pt rated effort of bed mobility and up to chair was 10/10 on RPE.     Vision   Vision Assessment?: No apparent visual deficits   Perception     Praxis      Cognition Arousal/Alertness: Awake/alert Behavior During Therapy: WFL for tasks assessed/performed Overall Cognitive Status: Within Functional Limits for tasks assessed                                 General Comments: Anxious regarding pain.        Exercises Total Joint Exercises Knee Flexion: AAROM;Right;15 reps;Seated (washcloth under R foot, self assisted R knee flexion with LLE crossed over RLE) General Exercises - Lower Extremity Ankle Circles/Pumps: AROM;Both;Supine;15 reps Heel Slides: AAROM;Right;10 reps;Supine Other Exercises Other Exercises: R gastroc stretch using gait belt looped on R foot; 30 sec hold x 3   Shoulder Instructions       General Comments      Pertinent Vitals/ Pain       Pain Assessment: 0-10 Pain Score: 3  Pain Location: Pt was premedicated for pain. Pt rating pain with all mobility as 1-3/10 throughout session. No higher than 3 although pt very guarded with movements of BLEs particularly RT LE/knee with facial grimace. Pt endorsed facial grimace is for anticipated pain but not actual pain. Pain Descriptors /  Indicators: Guarding;Grimacing Pain Intervention(s): Limited activity within patient's tolerance;Monitored during session;Premedicated before session;Repositioned  Home Living                                          Prior Functioning/Environment              Frequency  Min 2X/week        Progress Toward Goals  OT Goals(current goals can now be found in the care plan section)  Progress towards OT goals: Progressing toward goals  Acute Rehab OT Goals Patient Stated Goal: return to baseline-very active. Biochemist, clinical. OT Goal Formulation: With patient Time For Goal Achievement: 09/12/20 Potential to Achieve Goals: Good  Plan Discharge plan remains appropriate    Co-evaluation    PT/OT/SLP Co-Evaluation/Treatment: Yes Reason for Co-Treatment: Complexity of the patient's impairments (multi-system involvement);To address functional/ADL transfers;For patient/therapist safety PT goals addressed during session: Mobility/safety with mobility;Proper use of DME;Strengthening/ROM;Balance OT goals addressed during session: Proper use of Adaptive equipment and DME;Strengthening/ROM      AM-PAC OT "6 Clicks" Daily Activity  Outcome Measure   Help from another person eating meals?: None Help from another person taking care of personal grooming?: A Little Help from another person toileting, which includes using toliet, bedpan, or urinal?: A Lot Help from another person bathing (including washing, rinsing, drying)?: A Lot Help from another person to put on and taking off regular upper body clothing?: A Little Help from another person to put on and taking off regular lower body clothing?: Total 6 Click Score: 15    End of Session Equipment Utilized During Treatment: Gait belt;Rolling walker  OT Visit Diagnosis: Other abnormalities of gait and mobility (R26.89);Pain;Muscle weakness (generalized) (M62.81) Pain - Right/Left: Right Pain - part of body:  Arm;Knee;Ankle and joints of foot (LT as well, but primarily RT.)   Activity Tolerance Patient limited by pain   Patient Left in chair;with call bell/phone within reach;with family/visitor present;with chair alarm set   Nurse Communication Mobility status        Time: 1340-1405 OT Time Calculation (min): 25 min  Charges: OT General Charges $OT Visit: 1 Visit OT Treatments $Therapeutic Activity: 8-22 mins  Anderson Malta, [place] Office: [phone] 08/31/2020   Julien Girt 08/31/2020, 2:37 PM

## 2020-08-31 NOTE — TOC Benefit Eligibility Note (Signed)
Patient Advocate Encounter   Received notification that prior authorization for Eliquis 5 mg is required.   PA submitted on 08/31/2020 Key [id number] Status is pending       Lyndel Safe, CPhT Pharmacy Patient Advocate Specialist [place] Antimicrobial Stewardship Team Direct Number: [phone]  Fax: [phone]

## 2020-08-31 NOTE — Progress Notes (Signed)
16 Days Post-Op  Subjective: CC: Doing well. No abdominal pain, n/v. Having ostomy output.  Voiding. Tolerating diet.  Improved LLE pain. Able to walk now. Wife at [facility] Objective: Vital signs in last 24 hours: Temp:  [97.6 F (36.4 C)-98.1 F (36.7 C)] 98.1 F (36.7 C) (08/01 [id number]) Pulse Rate:  [91-101] 91 (08/01 0608) Resp:  [18-20] 18 (08/01 0608) BP: (124-136)/(76-87) 136/87 (08/01 0608) SpO2:  [99 %-100 %] 100 % (08/01 [id number]) Weight:  [80.7 kg] 80.7 kg (08/01 0650) Last BM Date: 08/30/20  Intake/Output from previous day: 07/31 0701 - 08/01 0700 In: 610 [P.O.:600; I.V.:10] Out: 2150 [Urine:1450; Stool:700] Intake/Output this shift: Total I/O In: 240 [P.O.:240] Out: -   PE: Gen:  Alert, NAD, pleasant Abd: Soft, ND, appropriately tender around wounds, +BS, colostomy bag with brown stool. Unable to visualize colostomy 2/2 stool. Drain SS. Wounds stable.  Psych: A&Ox3 Skin: no rashes noted, warm and dry, a few spots of scattered redness on LLE. Mild swelling of LLE    Lab Results:  Recent Labs    08/30/20 0715 08/31/20 0514  WBC 9.5 8.3  HGB 8.1* 8.5*  HCT 25.5* 26.9*  PLT 444* 506*    BMET Recent Labs    08/30/20 0715 08/31/20 0514  NA 130* 131*  K 3.2* 3.7  CL 99 98  CO2 23 26  GLUCOSE 234* 162*  BUN 15 18  CREATININE 0.72 0.65  CALCIUM 8.0* 8.2*    PT/INR No results for input(s): LABPROT, INR in the last 72 hours. CMP     Component Value Date/Time   NA 131 (L) 08/31/2020 0514   K 3.7 08/31/2020 0514   CL 98 08/31/2020 0514   CO2 26 08/31/2020 0514   GLUCOSE 162 (H) 08/31/2020 0514   BUN 18 08/31/2020 0514   CREATININE 0.65 08/31/2020 0514   CALCIUM 8.2 (L) 08/31/2020 0514   PROT 5.3 (L) 08/30/2020 0715   ALBUMIN 1.6 (L) 08/30/2020 0715   AST 23 08/30/2020 0715   ALT 38 08/30/2020 0715   ALKPHOS 234 (H) 08/30/2020 0715   BILITOT 0.6 08/30/2020 0715   GFRNONAA >60 08/31/2020 0514   GFRAA SPECIMEN CONTAMINATED, UNABLE TO PERFORM  TEST(S). 08/19/2020 1236   Lipase     Component Value Date/Time   LIPASE 18 08/12/2020 1010    Studies/Results: No results found.  Anti-infectives: Anti-infectives (From admission, onward)    Start     Dose/Rate Route Frequency Ordered Stop   08/15/20 1000  anidulafungin (ERAXIS) 100 mg in sodium chloride 0.9 % 100 mL IVPB        100 mg 78 mL/hr over 100 Minutes Intravenous Every 24 hours 08/14/20 1349 08/20/20 1326   08/14/20 1600  anidulafungin (ERAXIS) 200 mg in sodium chloride 0.9 % 200 mL IVPB        200 mg 78 mL/hr over 200 Minutes Intravenous  Once 08/14/20 1349 08/14/20 2000   08/14/20 1000  vancomycin (VANCOREADY) IVPB 1500 mg/300 mL  Status:  Discontinued        1,500 mg 150 mL/hr over 120 Minutes Intravenous Every 24 hours 08/13/20 1009 08/14/20 0901   08/13/20 1000  vancomycin (VANCOREADY) IVPB 1250 mg/250 mL  Status:  Discontinued        1,250 mg 166.7 mL/hr over 90 Minutes Intravenous Every 24 hours 08/12/20 1910 08/13/20 1009   08/12/20 2200  piperacillin-tazobactam (ZOSYN) IVPB 3.375 g  Status:  Discontinued        3.375 g 12.5 mL/hr over  240 Minutes Intravenous Every 8 hours 08/12/20 1904 08/12/20 1941   08/12/20 2000  clindamycin (CLEOCIN) IVPB 600 mg  Status:  Discontinued        600 mg 100 mL/hr over 30 Minutes Intravenous Every 8 hours 08/12/20 1757 08/17/20 0844   08/12/20 1830  piperacillin-tazobactam (ZOSYN) IVPB 3.375 g        3.375 g 12.5 mL/hr over 240 Minutes Intravenous Every 8 hours 08/12/20 1811 08/26/20 1829   08/12/20 1015  vancomycin (VANCOREADY) IVPB 1500 mg/300 mL        1,500 mg 150 mL/hr over 120 Minutes Intravenous  Once 08/12/20 1007 08/12/20 1330   08/12/20 1000  ceFEPIme (MAXIPIME) 2 g in sodium chloride 0.9 % 100 mL IVPB        2 g 200 mL/hr over 30 Minutes Intravenous  Once 08/12/20 1000 08/12/20 1121   08/12/20 1000  metroNIDAZOLE (FLAGYL) IVPB 500 mg        500 mg 100 mL/hr over 60 Minutes Intravenous  Once 08/12/20 1000  08/12/20 1226   08/12/20 1000  vancomycin (VANCOCIN) IVPB 1000 mg/200 mL premix  Status:  Discontinued        1,000 mg 200 mL/hr over 60 Minutes Intravenous  Once 08/12/20 1000 08/12/20 1007        Assessment/Plan POD 19 s/p drainage and debridement of LLQ abdominal wall 15 x 20 cm, partial colectomy with end colostomy, Splenic mobilization by Dr. Kieth Brightly on 08/12/20 for abdominal wall abscess, diverticulitis POD 17 s/p reopening of recent laparotomy, lysis of adhesions for 45 min. Small bowel resection with anastomosis, enterrorhaphy x 2, debridement of 200 cm^2 of necrotic fat and fascia of abdominal wall by Dr. Kieth Brightly on 08/14/20 for intestinal injury POD 16 s/p Debridement of abdominal wall with above dimensions with sharp dissection and Pulsavac of wounds by Dr. Brantley Stage on 7/16 for Open abdominal wall wounds left flank, right lower quadrant total area 738 cm ^2  - The area left lower quadrants was 20 cm x 30 cm and midline 20 cm x 6 cm and right lower quadrant 6 x 3 cm - Path 7/13 w/ diverticulitis - no evidence of malignancy  - On soft diet. Nutrition following. - Colostomy functioning - Cont wound care. BID WTD.  - WOCN following. - Comp 7d Eraxis, on Zosyn and completed 14d.  - Cont drain. SS. Consider removing before d/c to CIR.  - Mobilize. PT/OT rec CIR - Pulm toilet   FEN - Soft diet. BID PPI. Appreciate TRH assistance w/ fluid and electrolyte balance VTE - SCDs, therapeutic Lovenox ID - Abx completed. WBC normal.  Afebrile.  Foley - Out, voiding Dispo - CIR following. Cleared for discharge to CIR when bed available    Hyponatremia - appreciate TRH assistance. Improving  RUE/RLE DVT - Lovenox  ABL anemia - Hgb stable . Hyperglycemia - stable. SSI Protein Calorie Malnutrition - Pre-alb < 5 -> 11.4. TPN off, on diet. Nutrition following for supplements.  L elbow, R elbow, and R knee pain/swelling - Appreciate Dr. Aurea Graff assistance. S/p aspiration of R knee. On Toradol  and now celebrex  Dispo: ok to transition to oral blood thinner for dvt treatment; answered a lot of the wife's questions about CIR, long term wound plans, etc. Pt is cleared for CIR from my standpoint.   LOS: 19 days   Leighton Ruff. Redmond Pulling, MD, FACS General, Bariatric, & Minimally Invasive Surgery [facility], [place]

## 2020-08-31 NOTE — Progress Notes (Signed)
[place] for apixaban Indication: DVT  No Known Allergies  Patient Measurements: Height: '5\' 10"'$  (177.8 cm) Weight: 80.7 kg (177 lb 14.6 oz) IBW/kg (Calculated) : 73  Vital Signs: Temp: 98.1 F (36.7 C) (08/01 0608) Temp Source: Oral (08/01 0608) BP: 136/87 (08/01 0608) Pulse Rate: 91 (08/01 0608)  Labs: Recent Labs    08/29/20 0726 08/30/20 0715 08/31/20 0514  HGB 8.5* 8.1* 8.5*  HCT 26.2* 25.5* 26.9*  PLT 478* 444* 506*  CREATININE 0.57* 0.72 0.65    Assessment: 60 yo M admitted on 7/13 with perforated diverticulitis and abdominal wall abscess s/p multiple debridements, colostomy.  He was found to have acute DVT in the RUE around the PICC line and also in the RLE.  Anticoagulation started on 7/21 with heparin, then changed to enoxaparin, but was held due to ABL anemia from 7/26-7/28. Pharmacy was consulted to restart anticoagulation with Enoxaparin on 7/28, and consulted to change to apixaban on 8/1.    Today, 08/31/2020: SCr stable, CrCl > 30 ml/min CBC:  Hgb remains low/stable at 8.5, Plt elevated. No bleeding or complications reported.     Plan:  Change to apixaban 5 mg PO BID  - Skip 7 day loading dose d/t parenteral anticoag since 7/21 and anemia - Benefit check on 8/1 shows $50 copay for apixaban, but can also use discount card to get down to $10 copay Monitor CBC, s/s of bleed  Gretta Arab PharmD, BCPS Clinical Pharmacist [facility] main pharmacy [phone] 08/31/2020 11:34 AM

## 2020-08-31 NOTE — Progress Notes (Signed)
Patient states PT got him up to the chair and he sat up for awhile. They used the stedy. When he was put back to bed using the stedy he noticed bruising on the top of both feet. He states he didn't hit them on anything and is concerned about why he's bruising. Right knee is also edematous and he is wondering if he needs to have it drained again. Legs elevated when he was put back to bed.

## 2020-09-01 ENCOUNTER — Encounter (HOSPITAL_COMMUNITY): Payer: Self-pay | Admitting: General Surgery

## 2020-09-01 ENCOUNTER — Other Ambulatory Visit: Payer: Self-pay

## 2020-09-01 ENCOUNTER — Encounter (HOSPITAL_COMMUNITY): Payer: Self-pay | Admitting: Physical Medicine and Rehabilitation

## 2020-09-01 ENCOUNTER — Inpatient Hospital Stay (HOSPITAL_COMMUNITY)
Admission: RE | Admit: 2020-09-01 | Discharge: 2020-09-22 | DRG: 945 | Disposition: A | Discharge status: Home Health | Payer: Managed Care, Other (non HMO) | Source: Other Acute Inpatient Hospital | Attending: Physical Medicine and Rehabilitation | Admitting: Physical Medicine and Rehabilitation

## 2020-09-01 DIAGNOSIS — F4323 Adjustment disorder with mixed anxiety and depressed mood: Secondary | ICD-10-CM | POA: Diagnosis present

## 2020-09-01 DIAGNOSIS — I82621 Acute embolism and thrombosis of deep veins of right upper extremity: Secondary | ICD-10-CM | POA: Diagnosis present

## 2020-09-01 DIAGNOSIS — L89326 Pressure-induced deep tissue damage of left buttock: Secondary | ICD-10-CM | POA: Diagnosis present

## 2020-09-01 DIAGNOSIS — E871 Hypo-osmolality and hyponatremia: Secondary | ICD-10-CM

## 2020-09-01 DIAGNOSIS — I82461 Acute embolism and thrombosis of right calf muscular vein: Secondary | ICD-10-CM | POA: Diagnosis present

## 2020-09-01 DIAGNOSIS — R5381 Other malaise: Principal | ICD-10-CM | POA: Diagnosis present

## 2020-09-01 DIAGNOSIS — I10 Essential (primary) hypertension: Secondary | ICD-10-CM | POA: Diagnosis present

## 2020-09-01 DIAGNOSIS — L7682 Other postprocedural complications of skin and subcutaneous tissue: Secondary | ICD-10-CM | POA: Diagnosis not present

## 2020-09-01 DIAGNOSIS — Z933 Colostomy status: Secondary | ICD-10-CM | POA: Diagnosis not present

## 2020-09-01 DIAGNOSIS — M10061 Idiopathic gout, right knee: Secondary | ICD-10-CM | POA: Diagnosis present

## 2020-09-01 DIAGNOSIS — T380X5A Adverse effect of glucocorticoids and synthetic analogues, initial encounter: Secondary | ICD-10-CM | POA: Diagnosis not present

## 2020-09-01 DIAGNOSIS — M79604 Pain in right leg: Secondary | ICD-10-CM | POA: Diagnosis not present

## 2020-09-01 DIAGNOSIS — Z6821 Body mass index (BMI) 21.0-21.9, adult: Secondary | ICD-10-CM | POA: Diagnosis not present

## 2020-09-01 DIAGNOSIS — M25571 Pain in right ankle and joints of right foot: Secondary | ICD-10-CM

## 2020-09-01 DIAGNOSIS — D62 Acute posthemorrhagic anemia: Secondary | ICD-10-CM

## 2020-09-01 DIAGNOSIS — E559 Vitamin D deficiency, unspecified: Secondary | ICD-10-CM

## 2020-09-01 DIAGNOSIS — L89312 Pressure ulcer of right buttock, stage 2: Secondary | ICD-10-CM | POA: Diagnosis not present

## 2020-09-01 DIAGNOSIS — R52 Pain, unspecified: Secondary | ICD-10-CM

## 2020-09-01 DIAGNOSIS — E440 Moderate protein-calorie malnutrition: Secondary | ICD-10-CM | POA: Insufficient documentation

## 2020-09-01 DIAGNOSIS — I82409 Acute embolism and thrombosis of unspecified deep veins of unspecified lower extremity: Secondary | ICD-10-CM

## 2020-09-01 DIAGNOSIS — Z7901 Long term (current) use of anticoagulants: Secondary | ICD-10-CM

## 2020-09-01 DIAGNOSIS — E119 Type 2 diabetes mellitus without complications: Secondary | ICD-10-CM

## 2020-09-01 DIAGNOSIS — K5792 Diverticulitis of intestine, part unspecified, without perforation or abscess without bleeding: Secondary | ICD-10-CM | POA: Diagnosis present

## 2020-09-01 LAB — GLUCOSE, CAPILLARY
Glucose-Capillary: 129 mg/dL — ABNORMAL HIGH (ref 70–99)
Glucose-Capillary: 134 mg/dL — ABNORMAL HIGH (ref 70–99)
Glucose-Capillary: 190 mg/dL — ABNORMAL HIGH (ref 70–99)
Glucose-Capillary: 195 mg/dL — ABNORMAL HIGH (ref 70–99)
Glucose-Capillary: 203 mg/dL — ABNORMAL HIGH (ref 70–99)
Glucose-Capillary: 261 mg/dL — ABNORMAL HIGH (ref 70–99)

## 2020-09-01 LAB — CBC
HCT: 26.1 % — ABNORMAL LOW (ref 39.0–52.0)
Hemoglobin: 8.2 g/dL — ABNORMAL LOW (ref 13.0–17.0)
MCH: 29.7 pg (ref 26.0–34.0)
MCHC: 31.4 g/dL (ref 30.0–36.0)
MCV: 94.6 fL (ref 80.0–100.0)
Platelets: 484 10*3/uL — ABNORMAL HIGH (ref 150–400)
RBC: 2.76 MIL/uL — ABNORMAL LOW (ref 4.22–5.81)
RDW: 15.2 % (ref 11.5–15.5)
WBC: 8.5 10*3/uL (ref 4.0–10.5)
nRBC: 0 % (ref 0.0–0.2)

## 2020-09-01 MED ORDER — SODIUM CHLORIDE 0.9% FLUSH: 10.0000 mL | INTRAVENOUS | Status: DC | PRN

## 2020-09-01 MED ORDER — GUAIFENESIN-DM 100-10 MG/5ML PO SYRP: 5.0000 mL | ORAL_SOLUTION | Freq: Four times a day (QID) | ORAL | Status: DC | PRN

## 2020-09-01 MED ORDER — ORAL CARE MOUTH RINSE
15.0000 mL | Freq: Two times a day (BID) | OROMUCOSAL | Status: DC
  Administered 2020-09-01 – 2020-09-22 (×37): 15 mL via OROMUCOSAL

## 2020-09-01 MED ORDER — ONDANSETRON HCL 4 MG/2ML IJ SOLN: 4.0000 mg | Freq: Four times a day (QID) | INTRAMUSCULAR | Status: DC | PRN

## 2020-09-01 MED ORDER — DIPHENHYDRAMINE HCL 12.5 MG/5ML PO ELIX: 12.5000 mg | ORAL_SOLUTION | Freq: Four times a day (QID) | ORAL | Status: DC | PRN

## 2020-09-01 MED ORDER — PROCHLORPERAZINE 25 MG RE SUPP: 12.5000 mg | Freq: Four times a day (QID) | RECTAL | Status: DC | PRN

## 2020-09-01 MED ORDER — ONDANSETRON 4 MG PO TBDP
4.0000 mg | ORAL_TABLET | Freq: Four times a day (QID) | ORAL | Status: DC | PRN
Start: 2020-09-01 — End: 2020-09-01

## 2020-09-01 MED ORDER — SODIUM CHLORIDE 0.9% FLUSH
10.0000 mL | Freq: Two times a day (BID) | INTRAVENOUS | Status: DC
  Administered 2020-09-01 – 2020-09-16 (×26): 10 mL

## 2020-09-01 MED ORDER — METHOCARBAMOL 500 MG PO TABS
1000.0000 mg | ORAL_TABLET | Freq: Three times a day (TID) | ORAL | Status: DC
  Administered 2020-09-01 – 2020-09-22 (×63): 1000 mg via ORAL
  Filled 2020-09-01 (×63): qty 2

## 2020-09-01 MED ORDER — TRAZODONE HCL 50 MG PO TABS
25.0000 mg | ORAL_TABLET | Freq: Every evening | ORAL | Status: DC | PRN
  Filled 2020-09-01: qty 1

## 2020-09-01 MED ORDER — OXYCODONE HCL 5 MG PO TABS
10.0000 mg | ORAL_TABLET | ORAL | Status: DC | PRN
  Administered 2020-09-01 – 2020-09-02 (×3): 15 mg via ORAL
  Administered 2020-09-02: 10 mg via ORAL
  Administered 2020-09-02 – 2020-09-07 (×14): 15 mg via ORAL
  Administered 2020-09-07: 10 mg via ORAL
  Administered 2020-09-08 – 2020-09-10 (×13): 15 mg via ORAL
  Administered 2020-09-11 – 2020-09-13 (×6): 10 mg via ORAL
  Administered 2020-09-13 – 2020-09-14 (×5): 15 mg via ORAL
  Administered 2020-09-15: 10 mg via ORAL
  Administered 2020-09-15 – 2020-09-16 (×7): 15 mg via ORAL
  Administered 2020-09-17: 10 mg via ORAL
  Administered 2020-09-17 (×2): 15 mg via ORAL
  Administered 2020-09-18: 10 mg via ORAL
  Filled 2020-09-01 (×3): qty 3
  Filled 2020-09-01: qty 2
  Filled 2020-09-01 (×2): qty 3
  Filled 2020-09-01 (×2): qty 2
  Filled 2020-09-01: qty 3
  Filled 2020-09-01: qty 2
  Filled 2020-09-01 (×10): qty 3
  Filled 2020-09-01: qty 2
  Filled 2020-09-01 (×5): qty 3
  Filled 2020-09-01: qty 2
  Filled 2020-09-01 (×10): qty 3
  Filled 2020-09-01: qty 2
  Filled 2020-09-01 (×5): qty 3
  Filled 2020-09-01: qty 2
  Filled 2020-09-01 (×12): qty 3

## 2020-09-01 MED ORDER — HYDROMORPHONE HCL 1 MG/ML IJ SOLN
0.5000 mg | Freq: Two times a day (BID) | INTRAMUSCULAR | Status: DC | PRN
  Administered 2020-09-02 – 2020-09-10 (×13): 1 mg via INTRAVENOUS
  Administered 2020-09-12 (×2): 0.5 mg via INTRAVENOUS
  Administered 2020-09-13: 1 mg via INTRAVENOUS
  Filled 2020-09-01 (×21): qty 1

## 2020-09-01 MED ORDER — POLYETHYLENE GLYCOL 3350 17 G PO PACK
17.0000 g | PACK | Freq: Every day | ORAL | Status: DC | PRN
Start: 2020-09-01 — End: 2020-09-07

## 2020-09-01 MED ORDER — ENSURE MAX PROTEIN PO LIQD
11.0000 [oz_av] | Freq: Two times a day (BID) | ORAL | Status: DC
  Administered 2020-09-01 – 2020-09-22 (×33): 11 [oz_av] via ORAL
  Filled 2020-09-01 (×11): qty 330

## 2020-09-01 MED ORDER — ACETAMINOPHEN 325 MG PO TABS
325.0000 mg | ORAL_TABLET | ORAL | Status: DC | PRN
  Administered 2020-09-06 – 2020-09-21 (×4): 650 mg via ORAL
  Administered 2020-09-22: 325 mg via ORAL
  Filled 2020-09-01 (×4): qty 2
  Filled 2020-09-01: qty 1
  Filled 2020-09-01: qty 2

## 2020-09-01 MED ORDER — FLEET ENEMA 7-19 GM/118ML RE ENEM: 1.0000 | ENEMA | Freq: Once | RECTAL | Status: DC | PRN

## 2020-09-01 MED ORDER — ASCORBIC ACID 500 MG PO TABS
500.0000 mg | ORAL_TABLET | Freq: Two times a day (BID) | ORAL | Status: DC
  Administered 2020-09-01 – 2020-09-22 (×42): 500 mg via ORAL
  Filled 2020-09-01 (×42): qty 1

## 2020-09-01 MED ORDER — APIXABAN 5 MG PO TABS
5.0000 mg | ORAL_TABLET | Freq: Two times a day (BID) | ORAL | Status: DC
  Administered 2020-09-01 – 2020-09-22 (×42): 5 mg via ORAL
  Filled 2020-09-01 (×42): qty 1

## 2020-09-01 MED ORDER — CELECOXIB 200 MG PO CAPS
200.0000 mg | ORAL_CAPSULE | Freq: Every day | ORAL | Status: DC
  Administered 2020-09-02 – 2020-09-22 (×21): 200 mg via ORAL
  Filled 2020-09-01 (×21): qty 1

## 2020-09-01 MED ORDER — ADULT MULTIVITAMIN W/MINERALS CH
1.0000 | ORAL_TABLET | Freq: Every day | ORAL | Status: DC
  Administered 2020-09-02 – 2020-09-22 (×21): 1 via ORAL
  Filled 2020-09-01 (×21): qty 1

## 2020-09-01 MED ORDER — HYDROMORPHONE HCL 1 MG/ML IJ SOLN
0.5000 mg | INTRAMUSCULAR | Status: DC | PRN
Start: 2020-09-01 — End: 2020-09-01

## 2020-09-01 MED ORDER — CHLORHEXIDINE GLUCONATE CLOTH 2 % EX PADS
6.0000 | MEDICATED_PAD | Freq: Every day | CUTANEOUS | Status: DC
  Administered 2020-09-02 – 2020-09-03 (×2): 6 via TOPICAL

## 2020-09-01 MED ORDER — OXYCODONE HCL 5 MG PO TABS
10.0000 mg | ORAL_TABLET | ORAL | Status: DC | PRN
  Administered 2020-09-01: 15 mg via ORAL
  Filled 2020-09-01: qty 3

## 2020-09-01 MED ORDER — JUVEN PO PACK
1.0000 | PACK | Freq: Two times a day (BID) | ORAL | Status: DC
  Administered 2020-09-01 – 2020-09-22 (×42): 1 via ORAL
  Filled 2020-09-01 (×44): qty 1

## 2020-09-01 MED ORDER — INSULIN ASPART 100 UNIT/ML IJ SOLN
0.0000 [IU] | Freq: Three times a day (TID) | INTRAMUSCULAR | Status: DC
Start: 2020-09-01 — End: 2020-09-22
  Administered 2020-09-01: 1 [IU] via SUBCUTANEOUS
  Administered 2020-09-02: 2 [IU] via SUBCUTANEOUS
  Administered 2020-09-02 – 2020-09-03 (×3): 1 [IU] via SUBCUTANEOUS
  Administered 2020-09-03: 2 [IU] via SUBCUTANEOUS
  Administered 2020-09-04 (×2): 1 [IU] via SUBCUTANEOUS
  Administered 2020-09-05: 3 [IU] via SUBCUTANEOUS
  Administered 2020-09-05 – 2020-09-06 (×3): 1 [IU] via SUBCUTANEOUS
  Administered 2020-09-06: 3 [IU] via SUBCUTANEOUS
  Administered 2020-09-07: 2 [IU] via SUBCUTANEOUS
  Administered 2020-09-07 – 2020-09-08 (×4): 1 [IU] via SUBCUTANEOUS
  Administered 2020-09-08: 2 [IU] via SUBCUTANEOUS
  Administered 2020-09-09: 1 [IU] via SUBCUTANEOUS
  Administered 2020-09-09: 5 [IU] via SUBCUTANEOUS
  Administered 2020-09-09 – 2020-09-10 (×2): 2 [IU] via SUBCUTANEOUS
  Administered 2020-09-10: 3 [IU] via SUBCUTANEOUS
  Administered 2020-09-11 (×2): 1 [IU] via SUBCUTANEOUS
  Administered 2020-09-11: 2 [IU] via SUBCUTANEOUS
  Administered 2020-09-12 – 2020-09-15 (×10): 1 [IU] via SUBCUTANEOUS
  Administered 2020-09-15: 2 [IU] via SUBCUTANEOUS
  Administered 2020-09-16 – 2020-09-17 (×4): 1 [IU] via SUBCUTANEOUS
  Administered 2020-09-17 – 2020-09-18 (×2): 2 [IU] via SUBCUTANEOUS
  Administered 2020-09-18 – 2020-09-19 (×2): 1 [IU] via SUBCUTANEOUS
  Administered 2020-09-19: 2 [IU] via SUBCUTANEOUS
  Administered 2020-09-20 (×2): 1 [IU] via SUBCUTANEOUS
  Administered 2020-09-21: 3 [IU] via SUBCUTANEOUS
  Administered 2020-09-21: 1 [IU] via SUBCUTANEOUS

## 2020-09-01 MED ORDER — PANTOPRAZOLE SODIUM 40 MG PO TBEC
40.0000 mg | DELAYED_RELEASE_TABLET | Freq: Two times a day (BID) | ORAL | Status: DC
  Administered 2020-09-01 – 2020-09-22 (×42): 40 mg via ORAL
  Filled 2020-09-01 (×44): qty 1

## 2020-09-01 MED ORDER — NALOXONE HCL 0.4 MG/ML IJ SOLN: 0.4000 mg | INTRAMUSCULAR | Status: DC | PRN

## 2020-09-01 MED ORDER — BISACODYL 10 MG RE SUPP: 10.0000 mg | Freq: Every day | RECTAL | Status: DC | PRN

## 2020-09-01 MED ORDER — ENSURE ENLIVE PO LIQD
237.0000 mL | Freq: Two times a day (BID) | ORAL | Status: DC
  Administered 2020-09-01 – 2020-09-02 (×3): 237 mL via ORAL

## 2020-09-01 MED ORDER — INSULIN ASPART 100 UNIT/ML IJ SOLN
0.0000 [IU] | Freq: Every day | INTRAMUSCULAR | Status: DC
  Administered 2020-09-01 – 2020-09-06 (×2): 3 [IU] via SUBCUTANEOUS
  Administered 2020-09-09 – 2020-09-10 (×2): 2 [IU] via SUBCUTANEOUS

## 2020-09-01 MED ORDER — ZINC SULFATE 220 (50 ZN) MG PO CAPS
220.0000 mg | ORAL_CAPSULE | Freq: Every day | ORAL | Status: DC
  Administered 2020-09-01 – 2020-09-22 (×22): 220 mg via ORAL
  Filled 2020-09-01 (×22): qty 1

## 2020-09-01 MED ORDER — ALUM & MAG HYDROXIDE-SIMETH 200-200-20 MG/5ML PO SUSP: 30.0000 mL | ORAL | Status: DC | PRN

## 2020-09-01 MED ORDER — PROCHLORPERAZINE EDISYLATE 10 MG/2ML IJ SOLN: 5.0000 mg | Freq: Four times a day (QID) | INTRAMUSCULAR | Status: DC | PRN

## 2020-09-01 MED ORDER — PROCHLORPERAZINE MALEATE 5 MG PO TABS: 5.0000 mg | ORAL_TABLET | Freq: Four times a day (QID) | ORAL | Status: DC | PRN

## 2020-09-01 MED ORDER — SODIUM CHLORIDE 0.9% FLUSH
9.0000 mL | INTRAVENOUS | Status: DC | PRN
Start: 2020-09-01 — End: 2020-09-17
  Administered 2020-09-16: 9 mL via INTRAVENOUS

## 2020-09-01 MED ORDER — DICLOFENAC SODIUM 1 % EX GEL
2.0000 g | Freq: Four times a day (QID) | CUTANEOUS | Status: DC | PRN
  Administered 2020-09-18: 2 g via TOPICAL
  Filled 2020-09-01 (×2): qty 100

## 2020-09-01 NOTE — Consult Note (Signed)
[place] Nurse ostomy follow up Patient receiving care in [phone]. Spouse at bedside and performed 95% of pouch change/application process with my guidance. Stoma type/location: LUQ colostomy Stomal assessment/size: oval, 1.5 inches side to side, 1 inch top to bottom Peristomal assessment: intact. Dry shave performed by me with wife and patient observing Treatment options for stomal/peristomal skin: single barrier ring and convex pouch Output: soft brown stool Ostomy pouching: 1pc. Flex convex Kellie Simmering [id number]; barrier Antionette Poles [phone] Education provided:  With my stand by assistance, the spouse removed the existing 2 piece pouching system, cleaned around the stoma, observed the dry shaving of hair.  She observed my cutting of the convex pouching system--their first exposure to convexity. She observed how I sized the barrier ring around the stoma. She placed the pouch on the abdomen and secured it with light pressure.  She closed the pouch.  The patient and spouse asked a good number of appropriate questions and they were answered to their expressed satisfaction. Enrolled patient in [place] Start Discharge program: Yes, previously   Supplies at bedside.  Both understand only time will inform us if the convexity works better than the flat 2 pc pouching system with >1 barrier ring. The spouse verbalized liking the one piece convex better than the 2 pc. Val Riles, RN, MSN, CWOCN, CNS-BC, pager [phone]

## 2020-09-01 NOTE — Plan of Care (Signed)
New plan established

## 2020-09-01 NOTE — Progress Notes (Signed)
Inpatient Rehabilitation  Patient information reviewed and entered into eRehab system by Daphanie Oquendo M. Ioane Bhola, M.A., CCC/SLP, PPS Coordinator.  Information including medical coding, functional ability and quality indicators will be reviewed and updated through discharge.    

## 2020-09-01 NOTE — Progress Notes (Signed)
RN note: pt with 5 beats of SVT this shift, asymptomatic, medicated for pain twice this shift. Declined need to be turned every two hours, educated pt on the purpose, pt. States he is comfortable. No distress noted this shift, will continue to monitor pt closely.

## 2020-09-01 NOTE — Discharge Summary (Signed)
Patient ID: Roger Brennan XN:[id number] December 10, 1960 60 y.o.  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 This started as a small area, about the size of a silver dollar, and has progressively expanded and worsened. He reports pain after physical activity. Reports abdominal wall was not erythematous until today. He does report decreased appetite and intermittent fevers over the last several weeks. Denies chest pain, SOB, urinary symptoms, nausea, vomiting. He is having bowel movements still and last BM was yesterday. He started having some issues with constipation in early June and he made diet adjustments with fiber supplementation for this. His PCP was seeing him for this and was planning a CT scan but the earliest he could get scheduled was 7/26 and PCP felt he needed to be evaluated sooner. His wife is at the bedside and also reports ~20 lbs weight loss over the last 6 weeks. Past abdominal surgery includes bilateral inguinal hernia repairs at age 56. He reports NKDA and takes no blood thinning medications. He denies tobacco use, heavy alcohol use or illicit drug use. 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 He has never had a colonoscopy.   Procedures Dr. Kieth Brightly - Drainage and debridement of LLQ abdominal wall 15 x 20 cm, partial colectomy with end colostomy, Splenic mobilization - 08/12/20   Dr.  Kieth Brightly reopening of recent laparotomy, lysis of adhesions for 45 min. Small bowel resection with anastomosis, enterrorhaphy x 2, debridement of 200 cm^2 of necrotic fat and fascia of abdominal wall on 08/14/20  Dr. Brantley Stage - Debridement of abdominal wall with sharp dissection and Pulsavac of wounds on 08/15/20  Dr. Alvan Dame - Aspiration of Right Knee - 7/26  Hospital Course:  Patient presented on 7/13 as above. CT A/P concerning for perforated diverticulitis with associated fistulous connection to the abdominal wall. He was taken emergently to the OR by Dr. Kieth Brightly where he underwent drainage and debridement of LLQ abdominal wall 15 x 20 cm, partial colectomy with end colostomy, Splenic mobilization by Dr. Kieth Brightly on 7/13. He was admitted to general surgery service post op and remained on abx. CCM was consulted. On 7/15 pateint developed green drainage from midline and redness over the left flank which was new. He was taken back to the OR on 7/15 by Dr. Kieth Brightly where he underwent reopening of recent laparotomy, lysis of adhesions for 45 min; small bowel resection with anastomosis, enterrorhaphy x 2, debridement of 200 cm^2 of necrotic fat and fascia of abdominal wall. He returned to the ICU post op. On 7/16 he returned to the OR where he underwent debridement of abdominal wall with sharp dissection and Pulsavac of wounds by Dr. Brantley Stage. Patient tolerated the procedure well and was transported back to the ICU. He remained on broad spectrum abx. He developed an ileus post op and was started on TNA. Wound were managed with WTD dressing changes. Ileus resolved and NGT was removed. Diet was advanced  and tolerated. TNA was weaned off. He developed UE and LE edema for which [place] were obtained and revealed RUE and RLE DVT's. He was started on IV heparin. Patient transferred out of the unit on 7/23. Patient developed melanotic stools and abl anemia requiring transfusions. Anticoagulation was held. Serial hgbs were  monitored and when hgb stabilized, melena resolved, his anticoagulation was restarted. [facility] was consulted to assist with fluid balance and electrolyte management. Patient developed polyarthralgia's and joint swelling during admission. Ortho was consulted and performed joint aspiration of right knee 7/26. He was treated with Toradol > Celbrex with improvement. Patient completed Zosyn on 7/27. He worked with therapies who recommended CIR. On 8/2, the patient was voiding well, tolerating diet, working well with therapies, pain well controlled, vital signs stable, incisions clean and felt stable for discharge to CIR. Referral to ostomy clinic made. Follow up as noted below.   Physical Exam: See progress note from earlier today   Allergies as of 09/01/2020   No Known Allergies   Med Rec per CIR Would recommended d/c of ASA given patient is now on Celebrex and Anticoagulation medicine  Would recommend continuing anticoagulation for DVT's and follow up with PCP    Follow-up Information     [name], MD Follow up.   Specialty: Internal Medicine Why: For follow up of your blood clot in your arm and legs as well as the anticoagulation medicine you are on for this. Contact information: [place], [place] [address] [phone]         Paralee Cancel, MD Follow up.   Specialty: Orthopedic Surgery Why: For your joint pain Contact information: [address] [place] [address] [place] [address] [id number]         Kinsinger, Arta Bruce, MD Follow up on 09/23/2020.   Specialty: General Surgery Why: 9am for follow up. Please arrive 30-45 minutes prior to your appointment for paperwork. Please bring a copy of your photo ID and insurance card. Contact information: [place] [address] [phone]                 Signed: Alferd Apa, [facility] 09/01/2020, 12:05 PM Please see Amion for pager number during day hours  7:00am-4:30pm

## 2020-09-01 NOTE — Progress Notes (Signed)
Message received regarding available bed and acceptance for admit CIR. Paperwork and transport arranged. Alferd Apa, PA called to obtain an active discharge order. Report called to CIR. [facility] left with pt at 1415. All patient's belongings were taken home with wife.

## 2020-09-01 NOTE — Progress Notes (Signed)
Inpatient Rehabilitation Medication Review by a Pharmacist  A complete drug regimen review was completed for this patient to identify any potential clinically significant medication issues.  Clinically significant medication issues were identified:  no  Check AMION for pharmacist assigned to patient if future medication questions/issues arise during this admission.  Pharmacist comments:   Time spent performing this drug regimen review (minutes):  10   Ramond Craver 09/01/2020 4:30 PM

## 2020-09-01 NOTE — Progress Notes (Signed)
Inpatient Rehab Admissions Coordinator:   I received insurance auth for CIR and have a bed for this pt. Today. I will work toward getting him moved over to CIR today. RN may call report to [phone].  Clemens Catholic, [place], [place] Admissions Coordinator  [phone] ([place]) [phone] (office)

## 2020-09-01 NOTE — Progress Notes (Addendum)
17 Days Post-Op  Subjective: CC: Doing well. Feels he only needs IV pain medication for dressing changes. No abdominal pain otherwise. Tolerating diet without n/v. Having colostomy output. Voiding. Having some left knee pain and swelling. Asking if ortho can see him again. Undergoing teaching with wife for colostomy bag change by our WOCN.   Objective: Vital signs in last 24 hours: Temp:  [98 F (36.7 C)-99 F (37.2 C)] 99 F (37.2 C) (08/02 0419) Pulse Rate:  [96-107] 99 (08/02 0419) Resp:  [16-18] 18 (08/02 0419) BP: (126-137)/(79-83) 134/79 (08/02 0419) SpO2:  [98 %-100 %] 98 % (08/02 0419) Last BM Date: 08/31/20  Intake/Output from previous day: 08/01 0701 - 08/02 0700 In: 1198 [P.O.:1188; I.V.:10] Out: 1850 [Urine:1850] Intake/Output this shift: Total I/O In: 240 [P.O.:240] Out: 300 [Urine:300]  PE: Gen:  Alert, NAD, pleasant HEENT: EOM's intact, pupils equal and round Card: RRR  Pulm:  CTAB, no W/R/R, effort normal. On RA Abd: Soft, ND, appropriately tender around wounds, +BS, Ostomy pink and viable. Drain site c/d/i. Wounds stable. LLQ wound with healthy granulation tissue at the base. Midline wound with mixed granulation and fatty tissue without dehiscence or evisceration. The inferior aspect of the midline wound tunnels and connects to the LLQ wound. RLQ wound with healthy grannulation tissue at the base. No drainage. No periwound cellulitis.  Ext: Right knee swelling and tenderness. Radial and DP 2+.   Psych: A&Ox3 Skin: no rashes noted, warm and dry  Lab Results:  Recent Labs    08/31/20 0514 09/01/20 0452  WBC 8.3 8.5  HGB 8.5* 8.2*  HCT 26.9* 26.1*  PLT 506* 484*   BMET Recent Labs    08/30/20 0715 08/31/20 0514  NA 130* 131*  K 3.2* 3.7  CL 99 98  CO2 23 26  GLUCOSE 234* 162*  BUN 15 18  CREATININE 0.72 0.65  CALCIUM 8.0* 8.2*   PT/INR No results for input(s): LABPROT, INR in the last 72 hours. CMP     Component Value Date/Time   NA  131 (L) 08/31/2020 0514   K 3.7 08/31/2020 0514   CL 98 08/31/2020 0514   CO2 26 08/31/2020 0514   GLUCOSE 162 (H) 08/31/2020 0514   BUN 18 08/31/2020 0514   CREATININE 0.65 08/31/2020 0514   CALCIUM 8.2 (L) 08/31/2020 0514   PROT 5.3 (L) 08/30/2020 0715   ALBUMIN 1.6 (L) 08/30/2020 0715   AST 23 08/30/2020 0715   ALT 38 08/30/2020 0715   ALKPHOS 234 (H) 08/30/2020 0715   BILITOT 0.6 08/30/2020 0715   GFRNONAA >60 08/31/2020 0514   GFRAA SPECIMEN CONTAMINATED, UNABLE TO PERFORM TEST(S). 08/19/2020 1236   Lipase     Component Value Date/Time   LIPASE 18 08/12/2020 1010    Studies/Results: No results found.  Anti-infectives: Anti-infectives (From admission, onward)    Start     Dose/Rate Route Frequency Ordered Stop   08/15/20 1000  anidulafungin (ERAXIS) 100 mg in sodium chloride 0.9 % 100 mL IVPB        100 mg 78 mL/hr over 100 Minutes Intravenous Every 24 hours 08/14/20 1349 08/20/20 1326   08/14/20 1600  anidulafungin (ERAXIS) 200 mg in sodium chloride 0.9 % 200 mL IVPB        200 mg 78 mL/hr over 200 Minutes Intravenous  Once 08/14/20 1349 08/14/20 2000   08/14/20 1000  vancomycin (VANCOREADY) IVPB 1500 mg/300 mL  Status:  Discontinued        1,500 mg  150 mL/hr over 120 Minutes Intravenous Every 24 hours 08/13/20 1009 08/14/20 0901   08/13/20 1000  vancomycin (VANCOREADY) IVPB 1250 mg/250 mL  Status:  Discontinued        1,250 mg 166.7 mL/hr over 90 Minutes Intravenous Every 24 hours 08/12/20 1910 08/13/20 1009   08/12/20 2200  piperacillin-tazobactam (ZOSYN) IVPB 3.375 g  Status:  Discontinued        3.375 g 12.5 mL/hr over 240 Minutes Intravenous Every 8 hours 08/12/20 1904 08/12/20 1941   08/12/20 2000  clindamycin (CLEOCIN) IVPB 600 mg  Status:  Discontinued        600 mg 100 mL/hr over 30 Minutes Intravenous Every 8 hours 08/12/20 1757 08/17/20 0844   08/12/20 1830  piperacillin-tazobactam (ZOSYN) IVPB 3.375 g        3.375 g 12.5 mL/hr over 240 Minutes  Intravenous Every 8 hours 08/12/20 1811 08/26/20 1829   08/12/20 1015  vancomycin (VANCOREADY) IVPB 1500 mg/300 mL        1,500 mg 150 mL/hr over 120 Minutes Intravenous  Once 08/12/20 1007 08/12/20 1330   08/12/20 1000  ceFEPIme (MAXIPIME) 2 g in sodium chloride 0.9 % 100 mL IVPB        2 g 200 mL/hr over 30 Minutes Intravenous  Once 08/12/20 1000 08/12/20 1121   08/12/20 1000  metroNIDAZOLE (FLAGYL) IVPB 500 mg        500 mg 100 mL/hr over 60 Minutes Intravenous  Once 08/12/20 1000 08/12/20 1226   08/12/20 1000  vancomycin (VANCOCIN) IVPB 1000 mg/200 mL premix  Status:  Discontinued        1,000 mg 200 mL/hr over 60 Minutes Intravenous  Once 08/12/20 1000 08/12/20 1007        Assessment/Plan POD 20 s/p drainage and debridement of LLQ abdominal wall 15 x 20 cm, partial colectomy with end colostomy, Splenic mobilization by Dr. Kieth Brightly on 08/12/20 for abdominal wall abscess, diverticulitis POD 18 s/p reopening of recent laparotomy, lysis of adhesions for 45 min. Small bowel resection with anastomosis, enterrorhaphy x 2, debridement of 200 cm^2 of necrotic fat and fascia of abdominal wall by Dr. Kieth Brightly on 08/14/20 for intestinal injury POD 17 s/p Debridement of abdominal wall with above dimensions with sharp dissection and Pulsavac of wounds by Dr. Brantley Stage on 7/16 for Open abdominal wall wounds left flank, right lower quadrant total area 738 cm ^2  - The area left lower quadrants was 20 cm x 30 cm and midline 20 cm x 6 cm and right lower quadrant 6 x 3 cm - Path 7/13 w/ diverticulitis - no evidence of malignancy  - On soft diet. Nutrition following. - Colostomy functioning - Cont wound care. BID WTD. - WOCN following. - Comp 7d Eraxis, on Zosyn and completed 14d. - Drain out [employer]. PT/OT rec CIR - Pulm toilet   FEN - Soft diet. BID PPI. Appreciate TRH assistance w/ fluid and electrolyte balance VTE - SCDs, Eliquis  ID - Abx completed. WBC normal.  Afebrile. Foley - Out,  voiding Dispo - CIR today    Hyponatremia - appreciate TRH assistance. Improving RUE/RLE DVT - Eliquis  ABL anemia - Hgb stable. 8.2 from 8.5 Hyperglycemia - stable. SSI Protein Calorie Malnutrition - Pre-alb < 5 -> 11.4. TPN off, on diet. Nutrition following for supplements.  L elbow, R elbow, and R knee pain/swelling - Appreciate Dr. Aurea Graff assistance. S/p aspiration of R knee. On Toradol and now celebrex. I have asked Ortho to re-eval again today  given increased swelling reported of right knee. They said they will see at [facility] when [facility] to [facility].    Dispo: Plan for d/c to CIR today.    LOS: 20 days    Jillyn Ledger , [facility] 09/01/2020, 11:49 AM Please see Amion for pager number during day hours 7:00am-4:30pm

## 2020-09-01 NOTE — Progress Notes (Signed)
Inpatient Rehab Admissions Coordinator:   I opened a case for insurance auth with for CIR yesterday and await a final decision from [place]. I also noted that Pt. Continues to receive IV pain meds 3-4x a day. CIR will not be able to continue to provide IV pain meds except with dressing changes, so it would helpful if the attending service could work toward tapering them off. I have communicated concerns to the medical team at [facility].   Clemens Catholic, [place], [place] Admissions Coordinator  [phone] ([place]) [phone] (office)

## 2020-09-01 NOTE — Plan of Care (Signed)
  Problem: Education: Goal: Knowledge of General Education information will improve Description: Including pain rating scale, medication(s)/side effects and non-pharmacologic comfort measures Outcome: Progressing   Problem: Clinical Measurements: Goal: Will remain free from infection Outcome: Progressing Goal: Cardiovascular complication will be avoided Outcome: Progressing   Problem: Coping: Goal: Level of anxiety will decrease Outcome: Progressing

## 2020-09-01 NOTE — Progress Notes (Addendum)
PMR Admission Coordinator Pre-Admission Assessment   Patient: Roger Brennan is an 60 y.o., male MRN: [id number] DOB: 13-Oct-1960 Height: _0  (177.8 cm) Weight: 80.7 kg   Insurance Information HMO: yes    PPO:      PCP:      IPA:      80/20:      OTHER: Group [id number] PRIMARY: Cigna managed      Policy#: [id number]      Subscriber: patient CM Name: Sharen Counter       Phone#: [phone]     Fax#: [phone] I received a call from [facility] on 8/2, granting auth from 09/01/20-09/07/20 with updates due 8/8.  Pre-Cert#: [id number]      Employer: Full time Benefits:  Phone: Online - [email or web address] Eff Date: 02/01/2019 - 01/30/2021 Deductible: $4,000 ($4,000 met) OOP Max: $7,550 ($4,991.68 met) CIR: 70% coverage, 30% co-insurance SNF: 70% coverage, 30% co-insurance; limited to 60 days per service year Outpatient: $70 copay; limited to 20 visits/cal yr (20 remaining) Home Health:  70% coverage, 30% co-insurance; limited to 60 visits/cal yr (91 remaining) DME: 70% coverage, 30% co-insurance Providers: in network  SECONDARY: none       Policy#:       Phone#:     Development worker, community:        Phone#:     The Engineer, petroleum" for patients in Inpatient Rehabilitation Facilities with attached "Privacy Act [place] Records" was provided and verbally reviewed with: N/A   Emergency Contact Information Contact Information       Name Relation Home Work Mobile    allred,debra Spouse     [phone]           Current Medical History  Patient Admitting Diagnosis: Debility   History of Present Illness: An otherwise healthy 60 year old male who presented to [facility] today at the direction of PCP with LLQ abdominal pain and induration for the last 2-3 weeks. This started as a small area, about the size of a silver dollar, and has progressively expanded and worsened. He reports pain after physical activity. Reports abdominal wall was not erythematous  until today. He does report decreased appetite and intermittent fevers over the last several weeks. Denies chest pain, SOB, urinary symptoms, nausea, vomiting. He is having bowel movements still and last BM was yesterday. He started having some issues with constipation in early June and he made diet adjustments with fiber supplementation for this. His PCP was seeing him for this and was planning a CT scan but the earliest he could get scheduled was 7/26 and PCP felt he needed to be evaluated sooner. His wife is at the bedside and also reports ~20 lbs weight loss over the last 6 weeks. Past abdominal surgery includes bilateral inguinal hernia repairs at age 73. He reports NKDA and takes no blood thinning medications. He denies tobacco use, heavy alcohol use or illicit drug use. 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 He has never had a colonoscopy. During ED evaluation, a CT abdomen was obtained which revealed a colon perforation, abscess formation and fistulization to abdominal wall. HDS in the ED, got 2L LR and broad abx (vanc cefepime flagyl). The patient was taken emergently to the OR for ex lap, debridement of necrotizing soft tissue infection, partial colectomy with end colostomy. Course complicated by DVTs and acute blood loss anemia. Hgb  now  stable at 8.2  PT/OT evaluations were done with recommendations for inpatient rehab admission due to a functional decline.   Patient's medical record from [facility] has been reviewed by the rehabilitation admission coordinator and physician.   Past Medical History  History reviewed. No pertinent past medical history.   Family History   family history is not on file.   Prior Rehab/Hospitalizations Has the patient had prior rehab or hospitalizations prior to admission? No   Has the patient had major surgery during 100 days prior to admission? Yes              Current  Medications   Current Facility-Administered Medications:   acetaminophen (TYLENOL) tablet 1,000 mg, 1,000 mg, Oral, Q6H PRN, Jillyn Ledger, PA-C, 1,000 mg at 08/29/20 1508   apixaban (ELIQUIS) tablet 5 mg, 5 mg, Oral, BID, Shade, Haze Justin, RPH, 5 mg at 08/31/20 2234   celecoxib (CELEBREX) capsule 200 mg, 200 mg, Oral, Daily, Paralee Cancel, MD, 200 mg at 08/31/20 4098   Chlorhexidine Gluconate Cloth 2 % PADS 6 each, 6 each, Topical, Daily, Jillyn Ledger, PA-C, 6 each at 08/31/20 1001   diclofenac Sodium (VOLTAREN) 1 % topical gel 2 g, 2 g, Topical, QID PRN, Coralie Keens, MD, 2 g at 08/29/20 1502   diphenhydrAMINE (BENADRYL) injection 12.5 mg, 12.5 mg, Intravenous, Q6H PRN **OR** diphenhydrAMINE (BENADRYL) 12.5 MG/5ML elixir 12.5 mg, 12.5 mg, Oral, Q6H PRN, Maczis, Barth Kirks, PA-C   feeding supplement (BOOST / RESOURCE BREEZE) liquid 1 Container, 1 Container, Oral, BID BM, Maczis, Barth Kirks, PA-C, 1 Container at 08/31/20 1719   feeding supplement (ENSURE ENLIVE / ENSURE PLUS) liquid 237 mL, 237 mL, Oral, BID BM, Jillyn Ledger, PA-C, 237 mL at 08/31/20 2234   HYDROmorphone (DILAUDID) injection 0.5-1 mg, 0.5-1 mg, Intravenous, Q3H PRN, Jillyn Ledger, PA-C, 1 mg at 09/01/20 0436   insulin aspart (novoLOG) injection 0-15 Units, 0-15 Units, Subcutaneous, TID WC, Maczis, Barth Kirks, PA-C, 8 Units at 08/31/20 1335   MEDLINE mouth rinse, 15 mL, Mouth Rinse, BID, Maczis, Barth Kirks, PA-C, 15 mL at 08/31/20 2234   methocarbamol (ROBAXIN) tablet 1,000 mg, 1,000 mg, Oral, TID, Maczis, Barth Kirks, PA-C, 1,000 mg at 08/31/20 2234   metoprolol tartrate (LOPRESSOR) injection 5 mg, 5 mg, Intravenous, Q6H PRN, Maczis, Barth Kirks, PA-C   multivitamin with minerals tablet 1 tablet, 1 tablet, Oral, Daily, Maczis, Barth Kirks, PA-C, 1 tablet at 08/31/20 1000   naloxone (NARCAN) injection 0.4 mg, 0.4 mg, Intravenous, PRN **AND** sodium chloride flush (NS) 0.9 % injection 9 mL, 9 mL, Intravenous, PRN,  Maczis, Barth Kirks, PA-C   nutrition supplement (JUVEN) (JUVEN) powder packet 1 packet, 1 packet, Oral, BID BM, Maczis, Barth Kirks, PA-C, 1 packet at 08/31/20 1723   ondansetron (ZOFRAN-ODT) disintegrating tablet 4 mg, 4 mg, Oral, Q6H PRN **OR** ondansetron (ZOFRAN) injection 4 mg, 4 mg, Intravenous, Q6H PRN, Maczis, Michael M, PA-C   oxyCODONE (Oxy IR/ROXICODONE) immediate release tablet 5-10 mg, 5-10 mg, Oral, Q4H PRN, Jillyn Ledger, PA-C, 10 mg at 08/31/20 2234   pantoprazole (PROTONIX) EC tablet 40 mg, 40 mg, Oral, BID, Eudelia Bunch, RPH, 40 mg at 08/31/20 2234   polyethylene glycol (MIRALAX / GLYCOLAX) packet 17 g, 17 g, Oral, Daily PRN, Maczis, Barth Kirks, PA-C   sodium chloride flush (NS) 0.9 % injection 10-40 mL, 10-40 mL, Intracatheter, Q12H, Maczis, Barth Kirks, PA-C, 10 mL at 08/31/20 2230   sodium chloride flush (NS) 0.9 %  injection 10-40 mL, 10-40 mL, Intracatheter, PRN, Jillyn Ledger, PA-C, 10 mL at 08/17/20 1709   Patients Current Diet:  Diet Order                  DIET SOFT Room service appropriate? Yes; Fluid consistency: Thin; Fluid restriction: 1800 mL Fluid  Diet effective now                         Precautions / Restrictions Precautions Precautions: Fall Precaution Comments: colostomy, R JP drain, bandages over full abdomen, Restrictions Weight Bearing Restrictions: No    Has the patient had 2 or more falls or a fall with injury in the past year? No   Prior Activity Level Community (5-7x/wk): Went out daily, was working as Geologist, engineering.   Prior Functional Level Self Care: Did the patient need help bathing, dressing, using the toilet or eating? Independent   Indoor Mobility: Did the patient need assistance with walking from room to room (with or without device)? Independent   Stairs: Did the patient need assistance with internal or external stairs (with or without device)? Independent   Functional Cognition: Did the patient need help  planning regular tasks such as shopping or remembering to take medications? Independent   Home Assistive Devices / Equipment Home Assistive Devices/Equipment: None Home Equipment: None   Prior Device Use: Indicate devices/aids used by the patient prior to current illness, exacerbation or injury? None of the above   Current Functional Level Cognition   Overall Cognitive Status: Within Functional Limits for tasks assessed Orientation Level: Oriented X4 General Comments: Anxious regarding pain.    Extremity Assessment (includes Sensation/Coordination)   Upper Extremity Assessment: Overall WFL for tasks assessed  Lower Extremity Assessment: Defer to PT evaluation     ADLs   Overall ADL's : Needs assistance/impaired Eating/Feeding: NPO Grooming: Set up, Oral care, Wash/dry face, Brushing hair, Sitting Grooming Details (indicate cue type and reason): up in recliner to perform grooming tasks. Upper Body Bathing: Minimal assistance Upper Body Bathing Details (indicate cue type and reason): Needed assistance to wash R arm pit reporting difficulty with R elbow due to pain. Lower Body Bathing: Moderate assistance, Sitting/lateral leans, Bed level Upper Body Dressing : Minimal assistance, Sitting Lower Body Dressing: Total assistance, Sitting/lateral leans Lower Body Dressing Details (indicate cue type and reason): Total As to don socks. Toilet Transfer: RW, BSC, Moderate assistance, +2 for physical assistance Toilet Transfer Details (indicate cue type and reason): Edge of bed elevated, able to power up to standing on first attempt this session with moderate assist of 2. Needs cues for hand and foot placement and for anterior weight shift. Once standing increased time needed to allow pt to accomplish upright posture and maximum assist to negotiate RW in correct proximity for safety and leverage.  Pt then able to pivot to recliner (simulated in prep for [facility] transfers) with increased time/effort,  multimodal cues and Moderate assist of 2 people.  Pt lowered onto recliner with moderate assist of 2 with 2nd person needed to assist pt's RT foot forward prior to sitting to control RT knee pain. Toileting- Clothing Manipulation and Hygiene: Total assistance, Bed level Functional mobility during ADLs: Moderate assistance, +2 for physical assistance, Rolling walker, Cueing for sequencing, Cueing for safety General ADL Comments: Pt rated effort of bed mobility and up to chair was 10/10 on RPE.     Mobility   Overal bed mobility: Needs Assistance Bed Mobility: Supine to  Sit Rolling: Min assist Sidelying to sit: Min assist Supine to sit: Mod assist Sit to supine: Mod assist, +2 for physical assistance, +2 for safety/equipment, HOB elevated (+3 pt's wife assisting with sit to supine transfer) Sit to sidelying: +2 for safety/equipment, +2 for physical assistance, Max assist General bed mobility comments: min A to support RLE (painful when flexed more than ~30*), VCs technique for roll and sidelying to sit, used bedrail, used gait belt looped on R foot as leg lifter     Transfers   Overall transfer level: Needs assistance Equipment used: Rolling walker (2 wheeled) Transfer via Lift Equipment: Stedy Transfers: Sit to/from Stand, Risk manager Sit to Stand: +2 physical assistance, Mod assist, From elevated surface Stand pivot transfers: Min assist, +2 physical assistance, +2 safety/equipment General transfer comment: VCs hand placement, mod A to power up, pt performed marching in place x 5 BLEs then took several pivotal steps to recliner. Trunk flexed throughout but able to come to ~15* from full upright position with increased time/cues.     Ambulation / Gait / Stairs / Wheelchair Mobility   Ambulation/Gait Ambulation/Gait assistance: Min assist, +2 safety/equipment Gait Distance (Feet): 10 Feet Assistive device: Rolling walker (2 wheeled) Gait Pattern/deviations: Step-to pattern,  Step-through pattern General Gait Details: slow to step but steady with RW. Gait velocity: decr     Posture / Balance Dynamic Sitting Balance Sitting balance - Comments: Pt remains guarded with EOB sitting and with decreased Bil knee flexion to support sitting balance. Balance Overall balance assessment: Needs assistance Sitting-balance support: No upper extremity supported Sitting balance-Leahy Scale: Fair Sitting balance - Comments: Pt remains guarded with EOB sitting and with decreased Bil knee flexion to support sitting balance. Standing balance support: Bilateral upper extremity supported Standing balance-Leahy Scale: Poor Standing balance comment: reliant on external assist     Special needs/care consideration Continuous Drip IV  Yes has IV and on heparin drip, Has Colostomy bag Skin Open post op abdominal wounds, JP drain RUQ, and Special service needs IV pain meds with BID dressing changes.     Previous Home Environment (from acute therapy documentation) Living Arrangements: Spouse/significant other  Lives With: Spouse Available Help at Discharge: Family Type of Home: House Home Layout: Two level, Able to live on main level with bedroom/bathroom Home Access: Stairs to enter Entrance Stairs-Rails: Right, Left Entrance Stairs-Number of Steps: 4 Bathroom Shower/Tub: Multimedia programmer: [place]: No   Discharge Living Setting Plans for Discharge Living Setting: House, Lives with (comment) (Lives with wife.) Type of Home at Discharge: House Discharge Home Layout: Two level, Able to live on main level with bedroom/bathroom Alternate Level Stairs-Number of Steps: Flight Discharge Home Access: Stairs to enter Entrance Stairs-Rails: Right, Left, Can reach both Entrance Stairs-Number of Steps: 3-4 Discharge Bathroom Shower/Tub: Walk-in shower, Door Discharge Bathroom Toilet: Standard Discharge Bathroom Accessibility: Yes How Accessible: Accessible  via wheelchair, Accessible via walker Does the patient have any problems obtaining your medications?: No   Social/Family/Support Systems Patient Roles: Spouse Contact Information: Isac Caddy - wife Anticipated Caregiver: wife Anticipated Caregiver's Contact Information: Isac Caddy - wife - [phone] Ability/Limitations of Caregiver: Wife has her own business but can provide supervision after discharge. Caregiver Availability: 24/7 Discharge Plan Discussed with Primary Caregiver: Yes Is Caregiver In Agreement with Plan?: Yes Does Caregiver/Family have Issues with Lodging/Transportation while Pt is in Rehab?: No   Goals Patient/Family Goal for Rehab: PT/OT supervision to mod I goals Expected length of stay: 12-14 days  Cultural Considerations: None Pt/Family Agrees to Admission and willing to participate: Yes Program Orientation Provided & Reviewed with Pt/Caregiver Including Roles  & Responsibilities: Yes   Decrease burden of Care through IP rehab admission: N/A   Possible need for SNF placement upon discharge: Not anticipated   Patient Condition: I have reviewed medical records from [facility], spoken with CM, and spouse. I discussed via phone for inpatient rehabilitation assessment.  Patient will benefit from ongoing PT and OT, can actively participate in 3 hours of therapy a day 5 days of the week, and can make measurable gains during the admission.  Patient will also benefit from the coordinated team approach during an Inpatient Acute Rehabilitation admission.  The patient will receive intensive therapy as well as Rehabilitation physician, nursing, social worker, and care management interventions.  Due to bladder management, bowel management, safety, skin/wound care, disease management, medication administration, pain management, and patient education the patient requires 24 hour a day rehabilitation nursing.  The patient is currently minimum- mod+2 assist with mobility  and basic ADLs.  Discharge setting and therapy post discharge at home with home health is anticipated.  Patient has agreed to participate in the Acute Inpatient Rehabilitation Program and will admit today.   Preadmission Screen Completed By:  Genella Mech, 09/01/2020 8:19 AM ______________________________________________________________________   Discussed status with Dr. Ranell Patrick  on 09/01/20 at 37 and received approval for admission today.   Admission Coordinator:  Genella Mech, CCC-SLP, time 930/Date 09/01/20    Assessment/Plan: Diagnosis: Debility Does the need for close, 24 hr/day Medical supervision in concert with the patient's rehab needs make it unreasonable for this patient to be served in a less intensive setting? Yes Co-Morbidities requiring supervision/potential complications: perforation of colon, severe sepsis, hyperglycemia, pressure injury of skin, overweight (BMI 25.53), anemia Due to bladder management, bowel management, safety, skin/wound care, disease management, medication administration, pain management, and patient education, does the patient require 24 hr/day rehab nursing? Yes Does the patient require coordinated care of a physician, rehab nurse, PT, OT to address physical and functional deficits in the context of the above medical diagnosis(es)? Yes Addressing deficits in the following areas: balance, endurance, locomotion, strength, transferring, bowel/bladder control, bathing, dressing, feeding, grooming, toileting, and psychosocial support Can the patient actively participate in an intensive therapy program of at least 3 hrs of therapy 5 days a week? Yes The potential for patient to make measurable gains while on inpatient rehab is good Anticipated functional outcomes upon discharge from inpatient rehab: [facility] PT, [place] OT, independent SLP Estimated rehab length of stay to reach the above functional goals is: 2 weeks Anticipated discharge destination: Home 10. Overall  Rehab/Functional Prognosis: good     MD Signature: Leeroy Cha, MD          Revision History                                                      Note Details  Author Izora Ribas, MD File Time 09/01/2020 12:21 PM  Author Type Physician Status Signed  Last Editor Izora Ribas, MD Service Nursing  PMR Admission Coordinator Pre-Admission Assessment   Patient: Roger Brennan is an 60 y.o., male MRN: [id number] DOB: 11/05/1960 Height: _0  (177.8 cm) Weight: 80.7 kg   Insurance Information HMO: yes    PPO:      PCP:      IPA:      80/20:      OTHER: Group [id number] PRIMARY: Cigna managed      Policy#: [id number]      Subscriber: patient CM Name: Sharen Counter       Phone#: [phone]     Fax#: [phone] I received a call from [facility] on 8/2, granting auth from 09/01/20-09/07/20 with updates due 8/8.  Pre-Cert#: [id number]      Employer: Full time Benefits:  Phone: Online - [email or web address] Eff Date: 02/01/2019 - 01/30/2021 Deductible: $4,000 ($4,000 met) OOP Max: $7,550 ($4,991.68 met) CIR: 70% coverage, 30% co-insurance SNF: 70% coverage, 30% co-insurance; limited to 60 days per service year Outpatient: $70 copay; limited to 20 visits/cal yr (20 remaining) Home Health:  70% coverage, 30% co-insurance; limited to 60  visits/cal yr (2 remaining) DME: 70% coverage, 30% co-insurance Providers: in network  SECONDARY: none       Policy#:       Phone#:     Development worker, community:        Phone#:     The Engineer, petroleum" for patients in Inpatient Rehabilitation Facilities with attached "Privacy Act [place] Records" was provided and verbally reviewed with: N/A   Emergency Contact Information Contact Information       Name Relation Home Work Mobile    allred,debra Spouse     [phone]           Current Medical History  Patient Admitting Diagnosis: Debility   History of Present Illness: An otherwise healthy 60 year old male who presented to [facility] today at the direction of PCP with LLQ abdominal pain and induration for the last 2-3 weeks. This started as a small area, about the size of a silver dollar, and has progressively expanded and worsened. He reports pain after physical activity. Reports abdominal wall was not erythematous until today. He does report decreased appetite and intermittent fevers over the last several weeks. Denies chest pain, SOB, urinary symptoms, nausea, vomiting. He is having bowel movements still and last BM was yesterday. He started having some issues with constipation in early June and he made diet adjustments with fiber supplementation for this. His PCP was seeing him for this and was planning a CT scan but the earliest he could get scheduled was 7/26 and PCP felt he needed to be evaluated sooner. His wife is at the bedside and also reports ~20 lbs weight loss over the last 6 weeks. Past abdominal surgery includes bilateral inguinal hernia repairs at age 58. He reports NKDA and takes no blood thinning medications. He denies tobacco use, heavy alcohol use or illicit drug use. 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 He has never had a colonoscopy. During ED evaluation, a CT abdomen was obtained  which revealed a colon perforation, abscess formation and fistulization to abdominal wall. HDS in the ED, got 2L LR and broad abx (vanc cefepime flagyl). The patient was taken emergently to the OR for ex lap, debridement of necrotizing soft tissue infection, partial colectomy with end colostomy. Course complicated by DVTs and acute blood loss anemia. Hgb  now stable at 8.2  PT/OT evaluations were done with recommendations for inpatient rehab admission due to a functional decline.   Patient's medical record from [facility] has been reviewed by the rehabilitation admission coordinator and physician.   Past Medical History  History reviewed. No pertinent past medical history.   Family History   family history is not on file.   Prior Rehab/Hospitalizations Has the patient had prior rehab or hospitalizations prior to admission? No   Has the patient had major surgery during 100 days prior to admission? Yes              Current Medications   Current Facility-Administered Medications:   acetaminophen (TYLENOL) tablet 1,000 mg, 1,000 mg, Oral, Q6H PRN, Jillyn Ledger, PA-C, 1,000 mg at 08/29/20 1508   apixaban (ELIQUIS) tablet 5 mg, 5 mg, Oral, BID, Shade, Haze Justin, RPH, 5 mg at 08/31/20 2234   celecoxib (CELEBREX) capsule 200 mg, 200 mg, Oral, Daily, Paralee Cancel, MD, 200 mg at 08/31/20 8242   Chlorhexidine Gluconate Cloth 2 % PADS 6 each, 6 each, Topical, Daily, Jillyn Ledger, PA-C, 6 each at 08/31/20 1001   diclofenac Sodium (VOLTAREN) 1 % topical gel 2 g, 2 g, Topical, QID PRN, Coralie Keens, MD, 2 g at 08/29/20 1502   diphenhydrAMINE (BENADRYL) injection 12.5 mg, 12.5 mg, Intravenous, Q6H PRN **OR** diphenhydrAMINE (BENADRYL) 12.5 MG/5ML elixir 12.5 mg, 12.5 mg, Oral, Q6H PRN, Maczis, Barth Kirks, PA-C   feeding supplement (BOOST / RESOURCE BREEZE) liquid 1 Container, 1 Container, Oral, BID BM, Maczis, Barth Kirks, PA-C, 1 Container at 08/31/20 1719   feeding  supplement (ENSURE ENLIVE / ENSURE PLUS) liquid 237 mL, 237 mL, Oral, BID BM, Jillyn Ledger, PA-C, 237 mL at 08/31/20 2234   HYDROmorphone (DILAUDID) injection 0.5-1 mg, 0.5-1 mg, Intravenous, Q3H PRN, Jillyn Ledger, PA-C, 1 mg at 09/01/20 0436   insulin aspart (novoLOG) injection 0-15 Units, 0-15 Units, Subcutaneous, TID WC, Maczis, Barth Kirks, PA-C, 8 Units at 08/31/20 1335   MEDLINE mouth rinse, 15 mL, Mouth Rinse, BID, Maczis, Barth Kirks, PA-C, 15 mL at 08/31/20 2234   methocarbamol (ROBAXIN) tablet 1,000 mg, 1,000 mg, Oral, TID, Maczis, Barth Kirks, PA-C, 1,000 mg at 08/31/20 2234   metoprolol tartrate (LOPRESSOR) injection 5 mg, 5 mg, Intravenous, Q6H PRN, Maczis, Barth Kirks, PA-C   multivitamin with minerals tablet 1 tablet, 1 tablet, Oral, Daily, Maczis, Barth Kirks, PA-C, 1 tablet at 08/31/20 1000   naloxone (NARCAN) injection 0.4 mg, 0.4 mg, Intravenous, PRN **AND** sodium chloride flush (NS) 0.9 % injection 9 mL, 9 mL, Intravenous, PRN, Maczis, Barth Kirks, PA-C   nutrition supplement (JUVEN) (JUVEN) powder packet 1 packet, 1 packet, Oral, BID BM, Maczis, Barth Kirks, PA-C, 1 packet at 08/31/20 1723   ondansetron (ZOFRAN-ODT) disintegrating tablet 4 mg, 4 mg, Oral, Q6H PRN **OR** ondansetron (ZOFRAN) injection 4 mg, 4 mg, Intravenous, Q6H PRN, Maczis, Michael M, PA-C   oxyCODONE (Oxy IR/ROXICODONE) immediate release tablet 5-10 mg, 5-10 mg, Oral, Q4H PRN, Jillyn Ledger, PA-C, 10 mg at 08/31/20 2234   pantoprazole (PROTONIX) EC tablet 40 mg, 40 mg, Oral, BID, Eudelia Bunch, RPH, 40 mg at 08/31/20 2234   polyethylene glycol (MIRALAX / GLYCOLAX) packet 17 g, 17 g, Oral, Daily PRN, Maczis, Barth Kirks, PA-C   sodium chloride flush (NS) 0.9 % injection 10-40 mL, 10-40 mL, Intracatheter, Q12H, Maczis, Barth Kirks, PA-C, 10 mL at 08/31/20 2230   sodium chloride flush (NS) 0.9 % injection 10-40 mL, 10-40  mL, Intracatheter, PRN, Jillyn Ledger, PA-C, 10 mL at 08/17/20 1709   Patients Current  Diet:  Diet Order                  DIET SOFT Room service appropriate? Yes; Fluid consistency: Thin; Fluid restriction: 1800 mL Fluid  Diet effective now                         Precautions / Restrictions Precautions Precautions: Fall Precaution Comments: colostomy, R JP drain, bandages over full abdomen, Restrictions Weight Bearing Restrictions: No    Has the patient had 2 or more falls or a fall with injury in the past year? No   Prior Activity Level Community (5-7x/wk): Went out daily, was working as Geologist, engineering.   Prior Functional Level Self Care: Did the patient need help bathing, dressing, using the toilet or eating? Independent   Indoor Mobility: Did the patient need assistance with walking from room to room (with or without device)? Independent   Stairs: Did the patient need assistance with internal or external stairs (with or without device)? Independent   Functional Cognition: Did the patient need help planning regular tasks such as shopping or remembering to take medications? Independent   Home Assistive Devices / Equipment Home Assistive Devices/Equipment: None Home Equipment: None   Prior Device Use: Indicate devices/aids used by the patient prior to current illness, exacerbation or injury? None of the above   Current Functional Level Cognition   Overall Cognitive Status: Within Functional Limits for tasks assessed Orientation Level: Oriented X4 General Comments: Anxious regarding pain.    Extremity Assessment (includes Sensation/Coordination)   Upper Extremity Assessment: Overall WFL for tasks assessed  Lower Extremity Assessment: Defer to PT evaluation     ADLs   Overall ADL's : Needs assistance/impaired Eating/Feeding: NPO Grooming: Set up, Oral care, Wash/dry face, Brushing hair, Sitting Grooming Details (indicate cue type and reason): up in recliner to perform grooming tasks. Upper Body Bathing: Minimal assistance Upper Body Bathing  Details (indicate cue type and reason): Needed assistance to wash R arm pit reporting difficulty with R elbow due to pain. Lower Body Bathing: Moderate assistance, Sitting/lateral leans, Bed level Upper Body Dressing : Minimal assistance, Sitting Lower Body Dressing: Total assistance, Sitting/lateral leans Lower Body Dressing Details (indicate cue type and reason): Total As to don socks. Toilet Transfer: RW, BSC, Moderate assistance, +2 for physical assistance Toilet Transfer Details (indicate cue type and reason): Edge of bed elevated, able to power up to standing on first attempt this session with moderate assist of 2. Needs cues for hand and foot placement and for anterior weight shift. Once standing increased time needed to allow pt to accomplish upright posture and maximum assist to negotiate RW in correct proximity for safety and leverage.  Pt then able to pivot to recliner (simulated in prep for [facility] transfers) with increased time/effort, multimodal cues and Moderate assist of 2 people.  Pt lowered onto recliner with moderate assist of 2 with 2nd person needed to assist pt's RT foot forward prior to sitting to control RT knee pain. Toileting- Clothing Manipulation and Hygiene: Total assistance, Bed level Functional mobility during ADLs: Moderate assistance, +2 for physical assistance, Rolling walker, Cueing for sequencing, Cueing for safety General ADL Comments: Pt rated effort of bed mobility and up to chair was 10/10 on RPE.     Mobility   Overal bed mobility: Needs Assistance Bed Mobility: Supine to Sit Rolling: Min assist  Sidelying to sit: Min assist Supine to sit: Mod assist Sit to supine: Mod assist, +2 for physical assistance, +2 for safety/equipment, HOB elevated (+3 pt's wife assisting with sit to supine transfer) Sit to sidelying: +2 for safety/equipment, +2 for physical assistance, Max assist General bed mobility comments: min A to support RLE (painful when flexed more than ~30*),  VCs technique for roll and sidelying to sit, used bedrail, used gait belt looped on R foot as leg lifter     Transfers   Overall transfer level: Needs assistance Equipment used: Rolling walker (2 wheeled) Transfer via Lift Equipment: Stedy Transfers: Sit to/from Stand, Risk manager Sit to Stand: +2 physical assistance, Mod assist, From elevated surface Stand pivot transfers: Min assist, +2 physical assistance, +2 safety/equipment General transfer comment: VCs hand placement, mod A to power up, pt performed marching in place x 5 BLEs then took several pivotal steps to recliner. Trunk flexed throughout but able to come to ~15* from full upright position with increased time/cues.     Ambulation / Gait / Stairs / Wheelchair Mobility   Ambulation/Gait Ambulation/Gait assistance: Min assist, +2 safety/equipment Gait Distance (Feet): 10 Feet Assistive device: Rolling walker (2 wheeled) Gait Pattern/deviations: Step-to pattern, Step-through pattern General Gait Details: slow to step but steady with RW. Gait velocity: decr     Posture / Balance Dynamic Sitting Balance Sitting balance - Comments: Pt remains guarded with EOB sitting and with decreased Bil knee flexion to support sitting balance. Balance Overall balance assessment: Needs assistance Sitting-balance support: No upper extremity supported Sitting balance-Leahy Scale: Fair Sitting balance - Comments: Pt remains guarded with EOB sitting and with decreased Bil knee flexion to support sitting balance. Standing balance support: Bilateral upper extremity supported Standing balance-Leahy Scale: Poor Standing balance comment: reliant on external assist     Special needs/care consideration Continuous Drip IV  Yes has IV and on heparin drip, Has Colostomy bag Skin Open post op abdominal wounds, JP drain RUQ, and Special service needs IV pain meds with BID dressing changes.     Previous Home Environment (from acute therapy  documentation) Living Arrangements: Spouse/significant other  Lives With: Spouse Available Help at Discharge: Family Type of Home: House Home Layout: Two level, Able to live on main level with bedroom/bathroom Home Access: Stairs to enter Entrance Stairs-Rails: Right, Left Entrance Stairs-Number of Steps: 4 Bathroom Shower/Tub: Multimedia programmer: [place]: No   Discharge Living Setting Plans for Discharge Living Setting: House, Lives with (comment) (Lives with wife.) Type of Home at Discharge: House Discharge Home Layout: Two level, Able to live on main level with bedroom/bathroom Alternate Level Stairs-Number of Steps: Flight Discharge Home Access: Stairs to enter Entrance Stairs-Rails: Right, Left, Can reach both Entrance Stairs-Number of Steps: 3-4 Discharge Bathroom Shower/Tub: Walk-in shower, Door Discharge Bathroom Toilet: Standard Discharge Bathroom Accessibility: Yes How Accessible: Accessible via wheelchair, Accessible via walker Does the patient have any problems obtaining your medications?: No   Social/Family/Support Systems Patient Roles: Spouse Contact Information: Isac Caddy - wife Anticipated Caregiver: wife Anticipated Caregiver's Contact Information: Isac Caddy - wife - [phone] Ability/Limitations of Caregiver: Wife has her own business but can provide supervision after discharge. Caregiver Availability: 24/7 Discharge Plan Discussed with Primary Caregiver: Yes Is Caregiver In Agreement with Plan?: Yes Does Caregiver/Family have Issues with Lodging/Transportation while Pt is in Rehab?: No   Goals Patient/Family Goal for Rehab: PT/OT supervision to mod I goals Expected length of stay: 12-14 days Cultural Considerations: None Pt/Family  Agrees to Admission and willing to participate: Yes Program Orientation Provided & Reviewed with Pt/Caregiver Including Roles  & Responsibilities: Yes   Decrease burden of Care through  IP rehab admission: N/A   Possible need for SNF placement upon discharge: Not anticipated   Patient Condition: I have reviewed medical records from [facility], spoken with CM, and spouse. I discussed via phone for inpatient rehabilitation assessment.  Patient will benefit from ongoing PT and OT, can actively participate in 3 hours of therapy a day 5 days of the week, and can make measurable gains during the admission.  Patient will also benefit from the coordinated team approach during an Inpatient Acute Rehabilitation admission.  The patient will receive intensive therapy as well as Rehabilitation physician, nursing, social worker, and care management interventions.  Due to bladder management, bowel management, safety, skin/wound care, disease management, medication administration, pain management, and patient education the patient requires 24 hour a day rehabilitation nursing.  The patient is currently minimum- mod+2 assist with mobility and basic ADLs.  Discharge setting and therapy post discharge at home with home health is anticipated.  Patient has agreed to participate in the Acute Inpatient Rehabilitation Program and will admit today.   Preadmission Screen Completed By:  Genella Mech, 09/01/2020 8:19 AM ______________________________________________________________________   Discussed status with Dr. Ranell Patrick  on 09/01/20 at 64 and received approval for admission today.   Admission Coordinator:  Genella Mech, CCC-SLP, time 930/Date 09/01/20    Assessment/Plan: Diagnosis: Debility Does the need for close, 24 hr/day Medical supervision in concert with the patient's rehab needs make it unreasonable for this patient to be served in a less intensive setting? Yes Co-Morbidities requiring supervision/potential complications: perforation of colon, severe sepsis, hyperglycemia, pressure injury of skin, overweight (BMI 25.53), anemia Due to bladder management, bowel management, safety,  skin/wound care, disease management, medication administration, pain management, and patient education, does the patient require 24 hr/day rehab nursing? Yes Does the patient require coordinated care of a physician, rehab nurse, PT, OT to address physical and functional deficits in the context of the above medical diagnosis(es)? Yes Addressing deficits in the following areas: balance, endurance, locomotion, strength, transferring, bowel/bladder control, bathing, dressing, feeding, grooming, toileting, and psychosocial support Can the patient actively participate in an intensive therapy program of at least 3 hrs of therapy 5 days a week? Yes The potential for patient to make measurable gains while on inpatient rehab is good Anticipated functional outcomes upon discharge from inpatient rehab: [facility] PT, [place] OT, independent SLP Estimated rehab length of stay to reach the above functional goals is: 2 weeks Anticipated discharge destination: Home 10. Overall Rehab/Functional Prognosis: good     MD Signature: Leeroy Cha, MD          Revision History                                                      Note Details  Author Izora Ribas, MD File Time 09/01/2020 12:21 PM  Author Type Physician Status Signed  Last Editor Izora Ribas, MD Service Nursing

## 2020-09-01 NOTE — Progress Notes (Signed)
Patient arrived at approximately 1445 via stretcher from [facility]. Patient is A&O x 4 and able to make his needs known. Patient states that his pain is a 3/10 which has been acute pain. Patient reports medications, positioning and movement with therapy does help his pain. PERRLA, mucus membranes pink,  skin non tenting. Lungs clear bilateral, no cough or congestion noted, bowel sounds hypoactive x 4. 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 Patient has 3 surgical incisions with no S/S of infection. Bilateral feet +2 pitting edema, positive pedal pulses with cap refill less than 3 seconds. Patiens elbows, knees and ankles have increased swelling. Skin is warm to touch. Arrived with DTI noted on right buttock, foam dressing applied. Patient aware and understands the use of the call light, bed in low position, assist bars x 2, personal belongings all within reach. Safety maintained.

## 2020-09-01 NOTE — Progress Notes (Signed)
[place] for apixaban Indication: DVT  No Known Allergies  Patient Measurements:    Vital Signs: Temp: 99 F (37.2 C) (08/02 0419) Temp Source: Oral (08/02 0419) BP: 134/79 (08/02 0419) Pulse Rate: 99 (08/02 0419)  Labs: Recent Labs    08/30/20 0715 08/31/20 0514 09/01/20 0452  HGB 8.1* 8.5* 8.2*  HCT 25.5* 26.9* 26.1*  PLT 444* 506* 484*  CREATININE 0.72 0.65  --     Assessment: 60 yo M admitted on 7/13 with perforated diverticulitis and abdominal wall abscess s/p multiple debridements, colostomy.  He was found to have acute DVT in the RUE around the PICC line and also in the RLE.  Anticoagulation started on 7/21 with heparin, then changed to enoxaparin, but was held due to ABL anemia from 7/26-7/28. Pharmacy was consulted to restart anticoagulation with Enoxaparin on 7/28, and consulted to change to apixaban on 8/1.    Today, 09/01/2020: CrCl > 30 ml/min CBC:  Hgb remains low/stable at 8.2, Plt elevated No bleeding or complications reported    Plan:  Continue apixaban 5 mg PO BID  - Skip 7 day loading dose d/t parenteral anticoag since 7/21 and anemia - Benefit check on 8/1 shows $50 copay for apixaban, but can also use discount card to get down to $10 copay  Pharmacy signing off but will continue to follow peripherally - please re-consult if needed  Thank you for involving pharmacy in this patient's care.  Renold Genta, PharmD, BCPS Clinical Pharmacist Clinical phone for 09/01/2020 until 3p is [phone] 09/01/2020 3:18 PM  **Pharmacist phone directory can be found on [email or web address] listed under [place]**

## 2020-09-02 DIAGNOSIS — R5381 Other malaise: Secondary | ICD-10-CM | POA: Diagnosis not present

## 2020-09-02 DIAGNOSIS — E440 Moderate protein-calorie malnutrition: Secondary | ICD-10-CM | POA: Insufficient documentation

## 2020-09-02 LAB — CBC WITH DIFFERENTIAL/PLATELET
Abs Immature Granulocytes: 0.06 10*3/uL (ref 0.00–0.07)
Basophils Absolute: 0.1 10*3/uL (ref 0.0–0.1)
Basophils Relative: 1 %
Eosinophils Absolute: 0.4 10*3/uL (ref 0.0–0.5)
Eosinophils Relative: 6 %
HCT: 25.8 % — ABNORMAL LOW (ref 39.0–52.0)
Hemoglobin: 8.2 g/dL — ABNORMAL LOW (ref 13.0–17.0)
Immature Granulocytes: 1 %
Lymphocytes Relative: 12 %
Lymphs Abs: 0.9 10*3/uL (ref 0.7–4.0)
MCH: 29.4 pg (ref 26.0–34.0)
MCHC: 31.8 g/dL (ref 30.0–36.0)
MCV: 92.5 fL (ref 80.0–100.0)
Monocytes Absolute: 0.6 10*3/uL (ref 0.1–1.0)
Monocytes Relative: 8 %
Neutro Abs: 5.5 10*3/uL (ref 1.7–7.7)
Neutrophils Relative %: 72 %
Platelets: 532 10*3/uL — ABNORMAL HIGH (ref 150–400)
RBC: 2.79 MIL/uL — ABNORMAL LOW (ref 4.22–5.81)
RDW: 15.1 % (ref 11.5–15.5)
WBC: 7.6 10*3/uL (ref 4.0–10.5)
nRBC: 0 % (ref 0.0–0.2)

## 2020-09-02 LAB — MAGNESIUM: Magnesium: 1.8 mg/dL (ref 1.7–2.4)

## 2020-09-02 LAB — COMPREHENSIVE METABOLIC PANEL
ALT: 29 U/L (ref 0–44)
AST: 22 U/L (ref 15–41)
Albumin: 1.6 g/dL — ABNORMAL LOW (ref 3.5–5.0)
Alkaline Phosphatase: 156 U/L — ABNORMAL HIGH (ref 38–126)
Anion gap: 8 (ref 5–15)
BUN: 14 mg/dL (ref 6–20)
CO2: 26 mmol/L (ref 22–32)
Calcium: 8.6 mg/dL — ABNORMAL LOW (ref 8.9–10.3)
Chloride: 97 mmol/L — ABNORMAL LOW (ref 98–111)
Creatinine, Ser: 0.66 mg/dL (ref 0.61–1.24)
GFR, Estimated: >60 mL/min (ref >60)
Glucose, Bld: 134 mg/dL — ABNORMAL HIGH (ref 70–99)
Potassium: 4.2 mmol/L (ref 3.5–5.1)
Sodium: 131 mmol/L — ABNORMAL LOW (ref 135–145)
Total Bilirubin: 0.4 mg/dL (ref 0.3–1.2)
Total Protein: 5.7 g/dL — ABNORMAL LOW (ref 6.5–8.1)

## 2020-09-02 LAB — GLUCOSE, CAPILLARY
Glucose-Capillary: 138 mg/dL — ABNORMAL HIGH (ref 70–99)
Glucose-Capillary: 165 mg/dL — ABNORMAL HIGH (ref 70–99)
Glucose-Capillary: 133 mg/dL — ABNORMAL HIGH (ref 70–99)
Glucose-Capillary: 168 mg/dL — ABNORMAL HIGH (ref 70–99)

## 2020-09-02 LAB — VITAMIN D 25 HYDROXY (VIT D DEFICIENCY, FRACTURES): Vit D, 25-Hydroxy: 25.91 ng/mL — ABNORMAL LOW (ref 30–100)

## 2020-09-02 LAB — C-REACTIVE PROTEIN: CRP: 17.6 mg/dL — ABNORMAL HIGH (ref <1.0)

## 2020-09-02 LAB — URIC ACID: Uric Acid, Serum: 3.7 mg/dL (ref 3.7–8.6)

## 2020-09-02 LAB — SEDIMENTATION RATE: Sed Rate: 137 mm/hr — ABNORMAL HIGH (ref 0–16)

## 2020-09-02 MED ORDER — METHYLPREDNISOLONE ACETATE 40 MG/ML IJ SUSP
40.0000 mg | Freq: Once | INTRAMUSCULAR | Status: DC
  Filled 2020-09-02: qty 1

## 2020-09-02 MED ORDER — BUPIVACAINE HCL (PF) 0.5 % IJ SOLN
10.0000 mL | Freq: Once | INTRAMUSCULAR | Status: DC
  Filled 2020-09-02: qty 10

## 2020-09-02 MED ORDER — FUROSEMIDE 20 MG PO TABS
10.0000 mg | ORAL_TABLET | Freq: Every day | ORAL | Status: DC
  Administered 2020-09-02 – 2020-09-03 (×2): 10 mg via ORAL
  Filled 2020-09-02 (×2): qty 1

## 2020-09-02 NOTE — Progress Notes (Signed)
Inpatient Rehabilitation Care Coordinator Assessment and Plan Patient Details  Name: Roger Brennan MRN: [id number] Date of Birth: [birth year]  Today's Date: 09/02/2020  Hospital Problems: Principal Problem:   Debility Active Problems:   Malnutrition of moderate degree  Past Medical History: History reviewed. No pertinent past medical history. Past Surgical History:  Past Surgical History:  Procedure Laterality Date   ABDOMINAL WALL DEFECT REPAIR N/A 08/15/2020   Procedure: REPAIR ABDOMINAL WALL/REEXPLORATION OF ABDOMINAL WALL, IRRIGATION AND DEBRIDEMENT;  Surgeon: Erroll Luna, MD;  Location: [facility];  Service: General;  Laterality: N/A;   LAPAROTOMY N/A 08/14/2020   Procedure: EXPLORATORY LAPAROTOMY, REPAIR PERFORATED DIVERTICULUM;  Surgeon: Kieth Brightly Arta Bruce, MD;  Location: [facility];  Service: General;  Laterality: N/A;   PARTIAL COLECTOMY N/A 08/12/2020   Procedure: EXPLORATORY LAPAROTOMY,PARTIAL COLECTOMY;  Surgeon: Mickeal Skinner, MD;  Location: [facility];  Service: General;  Laterality: N/A;   Social History:  reports that he has never smoked. He has never been exposed to tobacco smoke. He has never used smokeless tobacco. He reports that he does not drink alcohol and does not use drugs.  Family / Support Systems Marital Status: Married Patient Roles: Spouse Spouse/Significant Other: Debra Anticipated Caregiver: Debra Allred Ability/Limitations of Caregiver: Has a buisness, but able to provide supervision Caregiver Availability: 24/7 Family Dynamics: Patient adopted. Unaware of much hx  Social History Preferred language: English Religion:  Read: Yes Write: Yes Employment Status: Employed Name of Employer: Biochemist, clinical Return to Work Plans: TBD Public relations account executive Issues: n/a Guardian/Conservator: n/a   Abuse/Neglect Abuse/Neglect Assessment Can Be Completed: Yes Physical Abuse: Denies Verbal Abuse: Denies Sexual Abuse: Denies Exploitation of  patient/patient's resources: Denies Self-Neglect: Denies  Emotional Status Recent Psychosocial Issues: n/a Psychiatric History: n/a Substance Abuse History: n/a  Patient / Family Perceptions, Expectations & Goals Pt/Family understanding of illness & functional limitations: yes Premorbid pt/family roles/activities: Patient reports being independent and healthy previosuly Anticipated changes in roles/activities/participation: Spouse able to help out with roles and task Pt/family expectations/goals: Supervision to [place]: None Premorbid Home Care/DME Agencies: None Transportation available at discharge: Family able to transport Resource referrals recommended: Neuropsychology (coping)  Discharge Planning Living Arrangements: Spouse/significant other Support Systems: Spouse/significant other Type of Residence: Private residence (2 level home, 4 steps to enter) Administrator, sports: Multimedia programmer (specify) Passenger transport manager) Financial Resources: Employment Financial Screen Referred: No Living Expenses: Own Money Management: Patient Does the patient have any problems obtaining your medications?: No Home Management: Independent Patient/Family Preliminary Plans: spouse able to assist with money and medication management Care Coordinator Barriers to Discharge: IV antibiotics, Wound Care, Other (comments) Care Coordinator Barriers to Discharge Comments: Colostomy, JP Drain, Dressing Changes Care Coordinator Anticipated Follow Up Needs: HH/OP Expected length of stay: 12-14 Days  Clinical Impression Sw met with patient and spouse. Patient plans to discharge home with spouse to care. No additional questions or concerns, sw will continue to follow up.  Dyanne Iha 09/02/2020, 3:18 PM

## 2020-09-02 NOTE — Progress Notes (Signed)
Initial Nutrition Assessment  DOCUMENTATION CODES:  Non-severe (moderate) malnutrition in context of acute illness/injury  INTERVENTION:  Continue Ensure Max po BID, each supplement provides 150 kcal and 30 grams of protein.    Continue Ensure Enlive po BID, each supplement provides 350 kcal and 20 grams of protein.  Continue 1 packet Juven BID, each packet provides 95 calories, 2.5 grams of protein (collagen), and 9.8 grams of carbohydrate (3 grams sugar); also contains 7 grams of L-arginine and L-glutamine, 300 mg vitamin C, 15 mg vitamin E, 1.2 mcg vitamin B-12, 9.5 mg zinc, 200 mg calcium, and 1.5 g  Calcium Beta-hydroxy-Beta-methylbutyrate to support wound healing.  Continue MVI with minerals daily.  NUTRITION DIAGNOSIS:  Moderate Malnutrition related to acute illness as evidenced by mild fat depletion, mild muscle depletion.  GOAL:  Patient will meet greater than or equal to 90% of their needs  MONITOR:  PO intake, Supplement acceptance, Diet advancement, Labs, Weight trends, Skin, I & O's  REASON FOR ASSESSMENT:  Malnutrition Screening Tool    ASSESSMENT:  60 year old male with history of HTN in the past otherwise in good health who started having issues with constipation since early June with decreased appetite, 20 lbs wt loss, abdominal pain progressing to intermittent fevers X few weeks. He was admitted on 08/12/20 to [facility] with abdominal pain due to diverticular perforation with  11.5 X 5 cm abscess and fistulization to abdominal wall. He was taken to OR emergently for Exp lap with debridement of LLQ wall, splenic mobilization and  partial colectomy with end colostomy by Dr. Georgiana Spinner.  Blood cultures negative and UCS showed E Coli.  He was placed on broad-spectrum antibiotics but continued to have upward rise in white count to 41,000 as well as confusion.     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    He was made n.p.o. with TNA for nutritional support.  He was treated with 7-day course of Eraxis and Zosyn x14 days.  Pathology showed evidence of diverticulitis without malignancy.  Acute blood loss anemia treated with transfusion and reactive leukocytosis has been resolving.     He developed acute DVT in right gastrocnemius vein as well as distal R- axillary DVT and surrounding PICC and SVT in basilic vein R antecubital fossa.  Significant Events: 7/13- admission with dx of abdominal wall abscess, necrotizing soft tissue of abdominal wall, perforated diverticulitis with colocutaneous fistula; I&D LLQ abdominal wall, partial colectomy and end colostomy, and splenic mobilization; NGT placement 7/15- initial RD assessment; exploration for concern of leak and inspection of L flank, LOAs, SBR with anastomosis, enterorrhaphy x2, debridement of necrotic fat and fascia of abdominal wall 7/16- placement of double lumen PICC in R basilic; debridement of abdominal wall with sharp dissection and pulsavac of wounds 7/18- NGT removal; diet advanced to CLD 7/19- diet advanced to FLD 7/20- diet advanced to Soft 7/21- TPN stopped  Per Epic, pt ate 100% of breakfast this morning. Pt with some improvement in appetite.  Weight down from admission to [facility], likely due to fluid. This has revealed more depletions than from previous exam.  Recommend continuing EE BID, EM BID, and Juven BID, as  well as MVI with minerals.  Medications: reviewed; Vitamin C BID, EE BID, Lasix, SSI, Depo-Medrol, MVI with minerals, Juven BID, Protonix BID, Ensure Max BID, zinc sulfate, oxycodone PRN (given twice today)  Labs: reviewed; Na 131 (L), CBG 134-261 (H) HbA1c: 7.4%  (07/2020)  NUTRITION - FOCUSED PHYSICAL EXAM: Flowsheet Row Most Recent Value  Orbital Region Mild depletion  Upper Arm Region Mild depletion  Thoracic and Lumbar Region No depletion  Buccal Region Mild depletion  Temple Region No depletion  Clavicle Bone Region Mild depletion  Clavicle and Acromion Bone Region Mild depletion  Scapular Bone Region Mild depletion  Dorsal Hand No depletion  Patellar Region Mild depletion  Anterior Thigh Region Mild depletion  Posterior Calf Region Mild depletion  Edema (RD Assessment) Mild  [BLE]  Hair Reviewed  Eyes Reviewed  Mouth Reviewed  Skin Reviewed  Nails Reviewed   Diet Order:   Diet Order             DIET SOFT Room service appropriate? Yes; Fluid consistency: Thin; Fluid restriction: 1800 mL Fluid  Diet effective now                  EDUCATION NEEDS:  No education needs have been identified at this time  Skin:  Skin Assessment: Skin Integrity Issues: Skin Integrity Issues:: Incisions, DTI DTI: L buttocks Incisions: Abdomen x3, closed  Last BM:  09/01/20 - Type 6, large; colostomy (50 ml today)  Height:  Ht Readings from Last 1 Encounters:  09/01/20 '5\' 10"'$  (1.778 m)   Weight:  Wt Readings from Last 1 Encounters:  09/02/20 75.4 kg   BMI:  Body mass index is 23.85 kg/m.  Estimated Nutritional Needs:  Kcal:  2400-2600 Protein:  125-140 grams Fluid:  >2.4 L  Derrel Nip, RD, LDN (she/her/hers) Registered Dietitian I After-Hours/Weekend Pager # in [place]

## 2020-09-02 NOTE — Progress Notes (Signed)
[facility]patient [facility] Individual Statement of Services  Patient Name:  Roger Brennan  Date:  09/02/2020  Welcome to the [facility].  Our goal is to provide you with an individualized program based on your diagnosis and situation, designed to meet your specific needs.  With this comprehensive rehabilitation program, you will be expected to participate in at least 3 hours of rehabilitation therapies Monday-Friday, with modified therapy programming on the weekends.  Your rehabilitation program will include the following services:  Physical Therapy (PT), Occupational Therapy (OT), Speech Therapy (ST), 24 hour per day rehabilitation nursing, Therapeutic Recreaction (TR), Neuropsychology, Care Coordinator, Rehabilitation Medicine, Nutrition Services, Pharmacy Services, and Other  Weekly team conferences will be held on Tuesday to discuss your progress.  Your Inpatient Rehabilitation Care Coordinator will talk with you frequently to get your input and to update you on team discussions.  Team conferences with you and your family in attendance may also be held.   Expected length of stay:  12-14 Days  Overall anticipated outcome:  Supervision to MOD I  Depending on your progress and recovery, your program may change. Your Inpatient Rehabilitation Care Coordinator will coordinate services and will keep you informed of any changes. Your Inpatient Rehabilitation Care Coordinator's name and contact numbers are listed  below.  The following services may also be recommended but are not provided by the [facility]:   [place] will be made to provide these services after discharge if needed.  Arrangements include referral to agencies that provide these services.  Your insurance has been verified to be:   [place] primary doctor is:   [name], MD  Pertinent  information will be shared with your doctor and your insurance company.  Inpatient Rehabilitation Care Coordinator:  Erlene Quan, [place] or [phone]  Information discussed with and copy given to patient by: Dyanne Iha, 09/02/2020, 2:42 PM

## 2020-09-02 NOTE — Progress Notes (Signed)
Patient remains alert and oriented x 4.  Went to all scheduled therapy. Medicated as needed for pain. Tolerated all scheduled medications. Wound care to abdomen performed and tolerated with dilaudid prior to dressing change. No hypo/hyperglycemic symptoms noted. Patient scheduled to receive steroid shot intra-articular but will like to hold up till he speaks with his attending provider prior to receiving. Will need consent signed when he is ready.  Abdominal wound appears clean with no odor noted. Left quad. Measures 6inches x 2inches x 0.75inches. Right quad. Measures 7inches x 2inches x 0.75inches. Serosanguinous drainage noted. Packed with WTD gauze, covered with abdominal dressing and secured with tape. Colostomy remains patent and intact with stoma pink.

## 2020-09-02 NOTE — Progress Notes (Signed)
PROGRESS NOTE   Subjective/Complaints: C/o pain in bilateral knee and ankle joints-  have requested ortho evaluation by Dr. Alvan Dame Discussed labs with patient today- inflammatory markers decreasing  ROS: +bilateral pain in knees and ankles   Objective:   No results found. Recent Labs    09/01/20 0452 09/02/20 0433  WBC 8.5 7.6  HGB 8.2* 8.2*  HCT 26.1* 25.8*  PLT 484* 532*   Recent Labs    08/31/20 0514 09/02/20 0433  NA 131* 131*  K 3.7 4.2  CL 98 97*  CO2 26 26  GLUCOSE 162* 134*  BUN 18 14  CREATININE 0.65 0.66  CALCIUM 8.2* 8.6*    Intake/Output Summary (Last 24 hours) at 09/02/2020 1449 Last data filed at 09/02/2020 0700 Gross per 24 hour  Intake 118 ml  Output 1850 ml  Net -1732 ml     Pressure Injury 09/01/20 Buttocks Left Deep Tissue Pressure Injury - Purple or maroon localized area of discolored intact skin or blood-filled blister due to damage of underlying soft tissue from pressure and/or shear. Purple colored area (Active)  09/01/20 1530  Location: Buttocks  Location Orientation: Left  Staging: Deep Tissue Pressure Injury - Purple or maroon localized area of discolored intact skin or blood-filled blister due to damage of underlying soft tissue from pressure and/or shear.  Wound Description (Comments): Purple colored area  Present on Admission: Yes    Physical Exam: Vital Signs Blood pressure 127/74, pulse 90, temperature 97.6 F (36.4 C), resp. rate 17, height 5' 10" (1.778 m), weight 75.4 kg, SpO2 100 %. Gen: no distress, normal appearing HEENT: oral mucosa pink and moist, NCAT Cardio: Reg rate Chest: normal effort, normal rate of breathing Abd: abdominal incisions covered with dressings, changed 8/2. Colostomy in place Ext: no edema Psych: pleasant, normal affect Skin: intact Neuro:    Mental Status: He is alert and oriented to person, place, and time. MSK: knee and ankle joints are  swollen and tender to palpation. ROM limited due to swelling    Assessment/Plan: 1. Functional deficits which require 3+ hours per day of interdisciplinary therapy in a comprehensive inpatient rehab setting. Physiatrist is providing close team supervision and 24 hour management of active medical problems listed below. Physiatrist and rehab team continue to assess barriers to discharge/monitor patient progress toward functional and medical goals  Care Tool:  Bathing    Body parts bathed by patient: Right arm, Left arm, Abdomen, Front perineal area, Chest, Right upper leg, Left upper leg, Face   Body parts bathed by helper: Right lower leg, Left lower leg, Buttocks     Bathing assist Assist Level: Moderate Assistance - Patient 50 - 74%     Upper Body Dressing/Undressing Upper body dressing   What is the patient wearing?: Pull over shirt    Upper body assist Assist Level: Maximal Assistance - Patient 25 - 49%    Lower Body Dressing/Undressing Lower body dressing      What is the patient wearing?: Pants     Lower body assist Assist for lower body dressing: Maximal Assistance - Patient 25 - 49%     Toileting Toileting    Toileting assist Assist for  toileting: Set up assist (urinal at bed level- pt with ostomy)     Transfers Chair/bed transfer  Transfers assist     Chair/bed transfer assist level: Maximal Assistance - Patient 25 - 49%     Locomotion Ambulation   Ambulation assist              Walk 10 feet activity   Assist           Walk 50 feet activity   Assist           Walk 150 feet activity   Assist           Walk 10 feet on uneven surface  activity   Assist           Wheelchair     Assist               Wheelchair 50 feet with 2 turns activity    Assist            Wheelchair 150 feet activity     Assist          Blood pressure 127/74, pulse 90, temperature 97.6 F (36.4 C), resp.  rate 17, height 5' 10" (1.778 m), weight 75.4 kg, SpO2 100 %.  Medical Problem List and Plan: 1.  Debility             -patient may shower but wound must be covered             -ELOS/Goals: 2-3 weeks MinA           Initial CIR evaluations today.  2.  L-gastroc DVT: continue Eliquis             -antiplatelet therapy: N/A 3. Pain Management:  Continue Oxycodone prn- currently requiring 15mg due to pain from swelling --IV dilaudid prior to dressing changes BID --Robaxin 1000 mg tid 4. Mood: LCSW to follow for evaluation and support.             -antipsychotic agents: N/AA 5. Neuropsych: This patient is capable of making decisions on his own behalf. 6. Abdominal incisions (3) and colostomy: Damp to dry dressing changes twice daily. --IV Dilaudid prior to dressing change. 7. Fluids/Electrolytes/Nutrition: Monitor intake/output.   --Continue Juven twice daily.   --Add vitamin C and zinc to help promote wound healing. 8. Wound infection: Has completed 9. Hyponatremia: Continue 1800 cc/FR- Stable around 130. Admission CMP stable- discussed with patient.  10. Acute blood loss anemia: Monitor with serial checks.             --recheck CBC in am. 11.  Polyarthralgias with effusion: Continue Celebrex daily.  Await Ortho input. Lasix 10mg. Discussed with patient that ESR and CRP are trending downward.  12. New diagnosis T2DM: Hgb A1C-7.4. Discontinue Ensure             --monitor BS ac/hs and use SSI for elevated BS.             --consult RD for dietary education.    LOS: 1 days A FACE TO FACE EVALUATION WAS PERFORMED  Krutika P Raulkar 09/02/2020, 2:49 PM     

## 2020-09-02 NOTE — Evaluation (Signed)
Physical Therapy Assessment and Plan  Patient Details  Name: Roger Brennan MRN: [id number] Date of Birth: Feb 09, 1960  PT Diagnosis: Abnormal posture, Abnormality of gait, Difficulty walking, Edema, Muscle spasms, Muscle weakness, and Pain in joint Rehab Potential: Good ELOS: 18-21 days   Today's Date: 09/02/2020 PT Individual Time: 1102-1205, second session: 1305-1415 PT Individual Time Calculation (min): 63 min, Second session:70 mins   Hospital Problem: Principal Problem:   Debility   Past Medical History: History reviewed. No pertinent past medical history. Past Surgical History:  Past Surgical History:  Procedure Laterality Date   ABDOMINAL WALL DEFECT REPAIR N/A 08/15/2020   Procedure: REPAIR ABDOMINAL WALL/REEXPLORATION OF ABDOMINAL WALL, IRRIGATION AND DEBRIDEMENT;  Surgeon: Erroll Luna, MD;  Location: [facility];  Service: General;  Laterality: N/A;   LAPAROTOMY N/A 08/14/2020   Procedure: EXPLORATORY LAPAROTOMY, REPAIR PERFORATED DIVERTICULUM;  Surgeon: Kieth Brightly, Arta Bruce, MD;  Location: [facility];  Service: General;  Laterality: N/A;   PARTIAL COLECTOMY N/A 08/12/2020   Procedure: EXPLORATORY LAPAROTOMY,PARTIAL COLECTOMY;  Surgeon: Mickeal Skinner, MD;  Location: [facility];  Service: General;  Laterality: N/A;    Assessment & Plan Clinical Impression:HPI: Roger Brennan is a 60 year old male with history of HTN in the past otherwise in good health who started having issues with constipation since early June with decreased appetite, 20 lbs wt loss, abdominal pain progressing to intermittent fevers X few weeks. He was admitted on 08/12/20 to [facility] with abdominal pain due to diverticular perforation with  11.5 X 5 cm abscess and fistulization to abdominal wall. He was taken to OR emergently for Exp lap with debridement of LLQ wall, splenic mobilization and  partial colectomy with end colostomy by Dr. Georgiana Spinner.  Blood cultures negative and UCS showed E Coli.  He was placed on  broad-spectrum antibiotics but continued to have upward rise in white count to 41,000 as well as confusion.   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    He was made n.p.o. with TNA for nutritional support.  He was treated with 7-day course of Eraxis and Zosyn x14 days.  Pathology showed evidence of diverticulitis without malignancy.  Acute blood loss anemia treated with transfusion and reactive leukocytosis has been resolving.   He developed acute DVT in right gastrocnemius vein as well as distal R- axillary DVT and surrounding PICC and SVT in basilic vein R antecubital fossa.  He was started on IV heparin and transitioned to Eliquis ($10 co-pay with discount card) for treatment.  Hospital course also significant for issues with severe pain with stiffness of bilateral elbow and right knee.  X-rays of the right elbow showed diffuse soft tissue swelling without fracture, left elbow with marked tissue swelling and right knee showed small knee joint effusion with mild to moderate tricompartmental degenerative changes.  Right knee effusion was aspirated 7/26 and he was treated with 4 doses of Toradol. Body fluid cultures was negative for crystals but showed 6100 WBC predominantly neutrophils, no organisms and no growth.  Surgery requested steroids not be used to allow for wound healing.  He has had recurrent issues with polyarthralgias with pain and decreasing range of motion despite addition of Celebrex  on 07/31.  Dr. Alvan Dame was reconsulted and plans to follow-up for input. Complains of pain in knee and ankle joints. Patient transferred to CIR on 09/01/2020 .   Patient currently  requires max with STS, Min/ModA bed mobility, MinA gait secondary to muscle weakness and muscle joint tightness, decreased cardiorespiratoy endurance, and decreased standing balance, decreased postural control, and decreased balance strategies, mostly 2/2 pain and decreased ROM in RLE>LLE.  Prior to hospitalization, patient was independent  with mobility and lived with Spouse in a House home.  Home access is 4Stairs to enter.  Patient will benefit from skilled PT intervention to maximize safe functional mobility, minimize fall risk, and decrease caregiver burden for planned discharge home with 24 hour supervision.  Anticipate patient will benefit from follow up OP at discharge.  PT - End of Session Endurance Deficit: Yes Endurance Deficit Description: generalized weakness/deconditioning   PT Evaluation Precautions/Restrictions Precautions Precautions: Fall Precaution Comments: colostomy, bandages over full abdomen, R knee ROM limitations Restrictions Weight Bearing Restrictions: No General Pain Pain Assessment Pain Scale: 0-10 Pain Score: 4  Pain Type: Acute pain Pain Location: Knee Pain Orientation: Left;Right Pain Onset: On-going Pain Intervention(s): Repositioned;Ambulation/increased activity;Emotional support;Therapeutic touch;Massage;Elevated extremity Home Living/Prior Functioning Home Living Available Help at Discharge: Family Type of Home: House Home Access: Stairs to enter [employer] of Steps: 4 Entrance Stairs-Rails: Right;Left (Can not reach both) Home Layout: Two level;Able to live on main level with bedroom/bathroom (Pt reports upstairs are gues rooms) Bathroom Shower/Tub: Multimedia programmer: Standard Bathroom Accessibility: Yes  Lives With: Spouse Prior Function Level of Independence: Independent with basic ADLs;Independent with transfers;Independent with gait;Independent with homemaking with ambulation  Able to Take Stairs?: Yes Driving:  Yes Vocation: Full time employment Comments: Biochemist, clinical Vision/Perception  Perception Perception: Within Functional Limits Praxis Praxis: Intact  Cognition Overall Cognitive Status: Within Functional Limits for tasks assessed Arousal/Alertness: Awake/alert Orientation Level: Oriented X4 Safety/Judgment: Appears intact Sensation Sensation Light Touch: Impaired Detail Central sensation comments: reports some diminished sensation in his RLE d/t swelling Light Touch Impaired Details: Impaired RLE Coordination Gross Motor Movements are Fluid and Coordinated: No Fine Motor Movements are Fluid and Coordinated: No Coordination and Movement Description: Joint tightness and pain Finger Nose Finger Test: [facility] Heel Shin Test: unable to perform due to pain in BLE (RLE>LLE) and joint stiffness Motor  Motor Motor: Other (comment);Abnormal postural alignment and control Motor - Skilled Clinical Observations: Joint tightness, increased pain with movement (Flex>etx)   Trunk/Postural Assessment  Cervical Assessment Cervical Assessment: Exceptions to [facility] (forward head in standing) Thoracic Assessment Thoracic Assessment: Exceptions to [facility] (rounded shoulders; forwards flexed in standing) Lumbar Assessment Lumbar Assessment: Exceptions to [facility] (Posterior pelvic tilt) Postural Control Postural Control: Deficits on evaluation (delayed)  Balance Balance Balance Assessed: Yes Static Sitting Balance Static Sitting - Balance Support: Feet supported Static Sitting - Level of Assistance: 5: Stand by assistance Dynamic Sitting Balance Dynamic Sitting - Balance Support: Feet supported Dynamic Sitting - Level of Assistance: 5: Stand by assistance Static Standing Balance Static Standing - Balance Support: During functional activity Static Standing - Level of Assistance: 3: Mod assist Dynamic Standing Balance Dynamic Standing - Balance Support: During functional activity;Bilateral upper  extremity supported Dynamic Standing - Level of Assistance: 3: Mod assist Extremity Assessment  RLE Assessment RLE Assessment: Exceptions to [facility] Active Range of Motion (AROM) Comments: 65 degrees flexion, -14 degrees extension (both AROM and PROM limited 2/2 increased pain and joint stiffness) General Strength Comments: not tested secondary to severe knee  pain LLE Assessment LLE Assessment: Exceptions to [facility] Active Range of Motion (AROM) Comments: pt able to get 90 flexion, -3 degrees extension. limited 2/2 increased pain and joint stiffness General Strength Comments: 4/5 (could be limited 2/2 pain in knee)  Care Tool Care Tool Bed Mobility Roll left and right activity   Roll left and right assist level: Minimal Assistance - Patient > 75%    Sit to lying activity   Sit to lying assist level: Moderate Assistance - Patient 50 - 74%    Lying to sitting edge of bed activity   Lying to sitting edge of bed assist level: Moderate Assistance - Patient 50 - 74%     Care Tool Transfers Sit to stand transfer   Sit to stand assist level: Maximal Assistance - Patient 25 - 49%    Chair/bed transfer   Chair/bed transfer assist level: Maximal Assistance - Patient 25 - 49%     Toilet transfer Toilet transfer activity did not occur: N/A Assist Level:  (ostomy, uses urinal at bed level)    Car transfer Car transfer activity did not occur: N/A (simulation room booked for group therapy)        Care Tool Locomotion Ambulation  Minimal Assistance - Patient > 75% Max distance: 10 Assistive device: Walker-rolling  Walk 10 feet activity  Minimal Assistance - Patient > 75%  Assistive device: Walker-rolling   Walk 50 feet with 2 turns activity Walk 50 feet with 2 turns activity did not occur: Safety/medical concerns      Walk 150 feet activity Walk 150 feet activity did not occur: Safety/medical concerns      Walk 10 feet on uneven surfaces activity Walk 10 feet on uneven surfaces activity  did not occur: Safety/medical concerns      Stairs Stair activity did not occur: Safety/medical concerns        Walk up/down 1 step activity Walk up/down 1 step or curb (drop down) activity did not occur: Safety/medical concerns        Walk up/down 4 steps activity  Safety/medical concerns    Walk up/down 12 steps activity Walk up/down 12 steps activity did not occur: Safety/medical concerns      Pick up small objects from floor Pick up small object from the floor (from standing position) activity did not occur: Safety/medical concerns      Wheelchair Will patient use wheelchair at discharge?: No          Wheel 50 feet with 2 turns activity   Assist Level: Contact Guard/Touching assist Distance: 60 feet  Wheel 150 feet activity Wheelchair 150 feet activity did not occur: Safety/medical concerns      Refer to Care Plan for Long Term Goals  SHORT TERM GOAL WEEK 1 PT Short Term Goal 1 (Week 1): Pt will perform STS ModA PT Short Term Goal 2 (Week 1): Pt will ambulate 25 ft MinA PT Short Term Goal 3 (Week 1): Pt will initiate stair training PT Short Term Goal 4 (Week 1): Pt will improve RLE ROM to 75+ flexion  Recommendations for other services: None   Skilled Therapeutic Intervention Session one: Pt received sitting in wc and agreeable to physical therapy evaluation. Pt reports increased pain and decreased ROM in BLE (R>L) .   Evaluation completed (see details above) with patient education regarding purpose of PT evaluation, PT POC and goals, therapy schedule, weekly team meetings, and other CIR information including safety plan and fall risk safety.    Manual therapy  with soft tissue massage and gentle grade 1/2 anterior/posterior glides RLE for pain management and building therapeutic alliance. Pt completed slide board transfer from wc to mat table with minA and max verbal/visual cuing for sequencing/hand placement. STS during session at [facility] and intermittently +2 to  boost up into standing secondary to BLE pain and joint tightness. Pt ambulated ~66f with minA and demonstrates antalgic step to gait pattern, forward flexed posture, and decreased lateral weight shifting.   Pt sitting in recliner with legs elevated for edema management, seatbelt alarm on, and call bell within reach. No further needs expressed at this time   Session two:  Pt sitting in recliner with wife present, agreeable to therapy.   Wheelchair mobility ~66fx2 in hall with CGA-supervision. Verbal cuing for energy efficient technique and environment navigation. Slide board transfers in session x2 minA and max verbal/visual cuing for sequencing/hand placement.  Supine with trunk elevation via wedge exercises completed BLE:  Heel slides x10 -therapist support at knee/ankle for RLE  Ankle pumps x15  LAQ 12x, 3 sec hold  Glute Sets 15x, 3sec hold  Manual therapy with soft tissue massage and gentle grade 1/2 anterior/posterior glides RLE for pain management x8' interspersed with heel slides/LAQ.  STS from elevated surface x3 with heavy ModA. Pt demonstrated decreased anterior weight shifting. Pt educated in positioning RLE farther out in order to decrease right knee flexion.    Sitting EOB>supine with ModA for LE management secondary to increased pain/decreased ROM. Pt supine in bed with HOB elevated, bed alarm on, and call bell within reach.  Mobility Bed Mobility Bed Mobility: Rolling Right;Rolling Left;Supine to Sit;Sit to Supine Rolling Right: Minimal Assistance - Patient > 75% Rolling Left: Minimal Assistance - Patient > 75% Supine to Sit: Moderate Assistance - Patient 50-74% Sit to Supine: Moderate Assistance - Patient 50-74% Transfers Transfers: Sit to Stand;Stand to Sit;Stand Pivot Transfers Sit to Stand: Maximal Assistance - Patient 25-49%;2 Helpers (+2 assist with boosting 2/2 pain) Stand to Sit: Maximal Assistance - Patient 25-49% Stand Pivot Transfers: Maximal Assistance -  Patient 25 - 49% (MinA once in standing, ModA with transition to sitting to position RLE with decreased flexion) Stand Pivot Transfer Details: Verbal cues for technique;Verbal cues for safe use of DME/AE;Verbal cues for sequencing Transfer (Assistive device): Rolling walker Locomotion  Gait Ambulation: Yes Gait Assistance: Minimal Assistance - Patient > 75% Gait Distance (Feet): 10 Feet Assistive device: Rolling walker Gait Assistance Details: Visual cues/gestures for sequencing;Verbal cues for sequencing;Verbal cues for safe use of DME/AE;Verbal cues for technique Gait Gait: Yes Gait Pattern: Impaired Gait Pattern: Step-to pattern;Decreased step length - right;Decreased step length - left;Shuffle;Decreased stance time - right;Decreased hip/knee flexion - right;Decreased weight shift to right;Antalgic;Trunk flexed Gait velocity: decreased   Discharge Criteria: Patient will be discharged from PT if patient refuses treatment 3 consecutive times without medical reason, if treatment goals not met, if there is a change in medical status, if patient makes no progress towards goals or if patient is discharged from hospital.  The above assessment, treatment plan, treatment alternatives and goals were discussed and mutually agreed upon: by patient  Analiah Drum, SPT  09/02/2020, 1:02 PM

## 2020-09-02 NOTE — Evaluation (Signed)
Occupational Therapy Assessment and Plan  Patient Details  Name: Roger Brennan MRN: [id number] Date of Birth: 04/07/1960  OT Diagnosis: muscle weakness (generalized) and pain in joint Rehab Potential: Rehab Potential (ACUTE ONLY): Good ELOS: 18-21 days   Today's Date: 09/02/2020 OT Individual Time: 3903-0092 OT Individual Time Calculation (min): 75 min     Hospital Problem: Principal Problem:   Debility   Past Medical History: History reviewed. No pertinent past medical history. Past Surgical History:  Past Surgical History:  Procedure Laterality Date   ABDOMINAL WALL DEFECT REPAIR N/A 08/15/2020   Procedure: REPAIR ABDOMINAL WALL/REEXPLORATION OF ABDOMINAL WALL, IRRIGATION AND DEBRIDEMENT;  Surgeon: Erroll Luna, MD;  Location: [facility];  Service: General;  Laterality: N/A;   LAPAROTOMY N/A 08/14/2020   Procedure: EXPLORATORY LAPAROTOMY, REPAIR PERFORATED DIVERTICULUM;  Surgeon: Kieth Brightly, Arta Bruce, MD;  Location: [facility];  Service: General;  Laterality: N/A;   PARTIAL COLECTOMY N/A 08/12/2020   Procedure: EXPLORATORY LAPAROTOMY,PARTIAL COLECTOMY;  Surgeon: Mickeal Skinner, MD;  Location: [facility];  Service: General;  Laterality: N/A;    Assessment & Plan Clinical Impression: Roger Brennan is a 60 year old male with history of HTN in the past otherwise in good health who started having issues with constipation since early June with decreased appetite, 20 lbs wt loss, abdominal pain progressing to intermittent fevers X few weeks. 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 He was taken to OR emergently for Exp lap with debridement of LLQ wall, splenic mobilization and  partial colectomy with end colostomy by Dr. Georgiana Spinner.  Blood cultures negative and UCS showed E Coli.  He was placed on broad-spectrum antibiotics but continued to have upward rise in white count to 41,000 as  well as confusion.   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    He was made n.p.o. with TNA for nutritional support.  He was treated with 7-day course of Eraxis and Zosyn x14 days.  Pathology showed evidence of diverticulitis without malignancy.  Acute blood loss anemia treated with transfusion and reactive leukocytosis has been resolving.   He developed acute DVT in right gastrocnemius vein as well as distal R- axillary DVT and surrounding PICC and SVT in basilic vein R antecubital fossa.  He was started on IV heparin and transitioned to Eliquis ($10 co-pay with discount card) for treatment.  Hospital course also significant for issues with severe pain with stiffness of bilateral elbow and right knee.  X-rays of the right elbow showed diffuse soft tissue swelling without fracture, left elbow with marked tissue swelling and right knee showed small knee joint effusion with mild to moderate tricompartmental degenerative changes.  Right knee effusion was aspirated 7/26 and he was treated with 4 doses of Toradol. Body fluid cultures was negative for crystals but showed 6100 WBC predominantly neutrophils, no organisms and no growth.  Surgery requested steroids not be used to allow for wound healing.  He has had recurrent issues with polyarthralgias with pain and decreasing range of motion despite addition of Celebrex on 07/31.  Dr. Alvan Dame was reconsulted and plans  to follow-up for input.  Ongoing and patient has been limited by pain currently marching in place with flexed posture, requires increased time for transitional movement and difficulty with ADLs due to elbow pain.  CIR was  recommended due to functional decline.Patient transferred to CIR on 09/01/2020 .    Patient currently requires max with basic self-care skills secondary to muscle joint tightness, decreased coordination, and decreased sitting balance, decreased standing balance, decreased postural control, and decreased balance strategies.  Prior to hospitalization, patient could complete ADLs with independent .  Patient will benefit from skilled intervention to decrease level of assist with basic self-care skills prior to discharge home with care partner.  Anticipate patient will require intermittent supervision and follow up home health.  OT - End of Session Activity Tolerance: Tolerates 30+ min activity with multiple rests Endurance Deficit: Yes Endurance Deficit Description: generalized weakness/deconditioning OT Assessment Rehab Potential (ACUTE ONLY): Good OT Patient demonstrates impairments in the following area(s): Balance;Endurance;Motor;Pain;Edema;Skin Integrity OT Basic ADL's Functional Problem(s): Bathing;Dressing;Toileting OT Transfers Functional Problem(s): Toilet;Tub/Shower OT Additional Impairment(s): None OT Plan OT Intensity: Minimum of 1-2 x/day, 45 to 90 minutes OT Frequency: 5 out of 7 days OT Duration/Estimated Length of Stay: 18-21 days OT Treatment/Interventions: Balance/vestibular training;Discharge planning;Pain management;Self Care/advanced ADL retraining;Therapeutic Activities;UE/LE Coordination activities;Functional mobility training;Patient/family education;Community reintegration;DME/adaptive equipment instruction;Wheelchair propulsion/positioning;UE/LE Strength taining/ROM;Therapeutic Exercise;Skin care/wound managment OT Self Feeding Anticipated Outcome(s): no goal set OT Basic Self-Care Anticipated Outcome(s): supervision OT Toileting Anticipated Outcome(s): supervision OT Bathroom Transfers Anticipated Outcome(s): supervision OT Recommendation Patient destination:  Home Follow Up Recommendations: Home health OT Equipment Recommended: To be determined   OT Evaluation Precautions/Restrictions  Precautions Precautions: Fall Precaution Comments: colostomy, R JP drain, bandages over full abdomen, R knee ROM limitations Restrictions Weight Bearing Restrictions: No General Chart Reviewed: Yes Family/Caregiver Present: No   Pain Pain Assessment Pain Scale: 0-10 Pain Score: 4  Home Living/Prior Functioning Home Living Family/patient expects to be discharged to:: Private residence Living Arrangements: Spouse/significant other Available Help at Discharge: Family Type of Home: House Home Access: Stairs to enter Technical brewer of Steps: 4 Entrance Stairs-Rails: Right, Left Home Layout: Two level, Able to live on main level with bedroom/bathroom Bathroom Shower/Tub: Multimedia programmer: Standard Bathroom Accessibility: Yes  Lives With: Spouse IADL History Homemaking Responsibilities: Yes Meal Prep Responsibility: Secondary Laundry Responsibility: Secondary Cleaning Responsibility: Secondary Bill Paying/Finance Responsibility: Secondary Shopping Responsibility: Secondary Current License: Yes Mode of Transportation: Car Occupation: Full time employment Prior Function Level of Independence: Independent with basic ADLs, Independent with transfers, Independent with gait Driving: Yes Vocation: Full time employment Comments: Microbiologist Baseline Vision/History: No visual deficits Patient Visual Report: No change from baseline Vision Assessment?: No apparent visual deficits Perception  Perception: Within Functional Limits Praxis Praxis: Intact Cognition Overall Cognitive Status: Within Functional Limits for tasks assessed Arousal/Alertness: Awake/alert Orientation Level: Person;Place;Situation Person: Oriented Place: Oriented Situation: Oriented Year: 2022 Month: August Day of Week: Correct Memory:  Appears intact Immediate Memory Recall: Sock;Blue;Bed Memory Recall Sock: Without Cue Memory Recall Blue: Without Cue Memory Recall Bed: Without Cue Attention: Alternating Selective Attention: Appears intact Awareness: Appears intact Problem Solving: Appears intact Safety/Judgment: Appears intact Sensation Sensation Light Touch: Impaired Detail Central sensation comments: reports some diminished sensation in his RLE d/t swelling Light Touch Impaired Details: Impaired RLE Coordination Gross Motor Movements are Fluid and Coordinated: No Fine Motor Movements are Fluid and Coordinated: No Coordination and Movement Description: Joint tightness and pain Finger Nose Finger Test: [facility] Motor  Motor Motor: Other (comment);Abnormal  postural alignment and control Motor - Skilled Clinical Observations: Joint tightness, pain  Trunk/Postural Assessment  Cervical Assessment Cervical Assessment: Within Functional Limits Thoracic Assessment Thoracic Assessment: Exceptions to [facility] (rounded shoulders and forward flexed in standing) Lumbar Assessment Lumbar Assessment: Exceptions to [facility] (posterior pelvic tilt) Postural Control Postural Control: Deficits on evaluation (delayed)  Balance Balance Balance Assessed: Yes Static Sitting Balance Static Sitting - Balance Support: Feet supported Static Sitting - Level of Assistance: 5: Stand by assistance Dynamic Sitting Balance Dynamic Sitting - Balance Support: Feet supported Dynamic Sitting - Level of Assistance: 5: Stand by assistance Static Standing Balance Static Standing - Balance Support: During functional activity Static Standing - Level of Assistance: 3: Mod assist Dynamic Standing Balance Dynamic Standing - Balance Support: During functional activity;Bilateral upper extremity supported Dynamic Standing - Level of Assistance: 3: Mod assist Extremity/Trunk Assessment RUE Assessment RUE Assessment: Exceptions to [facility] General Strength  Comments: Joint tightness and pain but overall WFL for AROM, MMT 3+5 LUE Assessment LUE Assessment: Exceptions to [facility] General Strength Comments: Joint tightness and pain but overall WFL for AROM, MMT 3+5  Care Tool Care Tool Self Care Eating   Eating Assist Level: Set up assist    Oral Care    Oral Care Assist Level: Set up assist    Bathing   Body parts bathed by patient: Right arm;Left arm;Abdomen;Front perineal area;Chest;Right upper leg;Left upper leg;Face Body parts bathed by helper: Right lower leg;Left lower leg;Buttocks   Assist Level: Moderate Assistance - Patient 50 - 74%    Upper Body Dressing(including orthotics)   What is the patient wearing?: Pull over shirt   Assist Level: Maximal Assistance - Patient 25 - 49%    Lower Body Dressing (excluding footwear)   What is the patient wearing?: Pants Assist for lower body dressing: Maximal Assistance - Patient 25 - 49%    Putting on/Taking off footwear   What is the patient wearing?: Non-skid slipper socks Assist for footwear: Dependent - Patient 0%       Care Tool Toileting Toileting activity   Assist for toileting: Set up assist (urinal at bed level- pt with ostomy)     Care Tool Bed Mobility Roll left and right activity   Roll left and right assist level: Minimal Assistance - Patient > 75%    Sit to lying activity   Sit to lying assist level: Moderate Assistance - Patient 50 - 74%    Lying to sitting edge of bed activity   Lying to sitting edge of bed assist level: Moderate Assistance - Patient 50 - 74%     Care Tool Transfers Sit to stand transfer   Sit to stand assist level: Maximal Assistance - Patient 25 - 49%    Chair/bed transfer   Chair/bed transfer assist level: Maximal Assistance - Patient 25 - 49%     Toilet transfer Toilet transfer activity did not occur: N/A Assist Level:  (ostomy, uses urinal at bed level)     Care Tool Cognition Expression of Ideas and Wants Expression of Ideas and  Wants: Without difficulty (complex and basic) - expresses complex messages without difficulty and with speech that is clear and easy to understand   Understanding Verbal and Non-Verbal Content Understanding Verbal and Non-Verbal Content: Understands (complex and basic) - clear comprehension without cues or repetitions   Memory/Recall Ability *first 3 days only Memory/Recall Ability *first 3 days only: Current season;That he or she is in a hospital/hospital unit    Refer to Care Plan  for Long Term Goals  SHORT TERM GOAL WEEK 1 OT Short Term Goal 1 (Week 1): Pt will use LRAD to don LB clothing with mod A OT Short Term Goal 2 (Week 1): Pt will complete sit > stand with mod A OT Short Term Goal 3 (Week 1): Pt will complete stand pivot transfer with mod A  Recommendations for other services: None    Skilled Therapeutic Intervention Eval completed as described above and below. Pt limited by R knee and ankle pain/joint tightness mainly. Mod-max A for transfers during session. Est 18-21 day length of stay for supervision level goals. Pt left sitting in the wc with all needs met.   ADL ADL Eating: Supervision/safety;Set up Where Assessed-Eating: Bed level Grooming: Supervision/safety;Setup Where Assessed-Grooming: Sitting at sink Upper Body Bathing: Supervision/safety;Setup Where Assessed-Upper Body Bathing: Edge of bed Lower Body Bathing: Supervision/safety;Setup Where Assessed-Lower Body Bathing: Edge of bed Upper Body Dressing: Supervision/safety;Setup Where Assessed-Upper Body Dressing: Edge of bed Lower Body Dressing: Maximal assistance Where Assessed-Lower Body Dressing: Edge of bed Toileting: Minimal assistance (urinal, colostomy) Where Assessed-Toileting: Bed level Toilet Transfer: Unable to assess Toilet Transfer Method: Stand pivot Mobility  Bed Mobility Bed Mobility: Rolling Right;Rolling Left;Supine to Sit;Sit to Supine Rolling Right: Minimal Assistance - Patient >  75% Rolling Left: Minimal Assistance - Patient > 75% Supine to Sit: Moderate Assistance - Patient 50-74% Sit to Supine: Moderate Assistance - Patient 50-74% Transfers Sit to Stand: Maximal Assistance - Patient 25-49% Stand to Sit: Maximal Assistance - Patient 25-49%   Discharge Criteria: Patient will be discharged from OT if patient refuses treatment 3 consecutive times without medical reason, if treatment goals not met, if there is a change in medical status, if patient makes no progress towards goals or if patient is discharged from hospital.  The above assessment, treatment plan, treatment alternatives and goals were discussed and mutually agreed upon: by patient  Curtis Sites 09/02/2020, 9:57 AM

## 2020-09-02 NOTE — H&P (Signed)
Physical Medicine and Rehabilitation Admission H&P    Chief Complaint  Patient presents with   Debility    HPI: Roger Brennan is a 60 year old male with history of HTN in the past otherwise in good health who started having issues with constipation since early June with decreased appetite, 20 lbs wt loss, abdominal pain progressing to intermittent fevers X few weeks. 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 He was taken to OR emergently for Exp lap with debridement of LLQ wall, splenic mobilization and  partial colectomy with end colostomy by Dr. Georgiana Spinner.  Blood cultures negative and UCS showed E Coli.  He was placed on broad-spectrum antibiotics but continued to have upward rise in white count to 41,000 as well as confusion.   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    He was made n.p.o. with TNA for nutritional support.  He was treated with 7-day course of Eraxis and Zosyn x14 days.  Pathology showed evidence of diverticulitis without malignancy.  Acute blood loss anemia treated with transfusion and reactive leukocytosis has been resolving.  He developed acute DVT in right gastrocnemius vein as well as distal R- axillary DVT and surrounding PICC and SVT in basilic vein R antecubital fossa.  He was started on IV heparin and transitioned to Eliquis ($10 co-pay with discount card) for treatment.  Hospital course also significant for issues with severe pain with stiffness of bilateral elbow and right knee.  X-rays of the right elbow showed diffuse soft  tissue swelling without fracture, left elbow with marked tissue swelling and right knee showed small knee joint effusion with mild to moderate tricompartmental degenerative changes.  Right knee effusion was aspirated 7/26 and he was treated with 4 doses of Toradol. Body fluid cultures was negative for crystals but showed 6100 WBC predominantly neutrophils, no organisms and no growth.  Surgery requested steroids not be used to allow for wound healing.  He has had recurrent issues with polyarthralgias with pain and decreasing range of motion despite addition of Celebrex on 07/31.  Dr. Alvan Dame was reconsulted and plans to follow-up for input.  Ongoing and patient has been limited by pain currently marching in place with flexed posture, requires increased time for transitional movement and difficulty with ADLs due to elbow pain.  CIR was recommended due to functional decline. Complains of pain in knee and ankle joints.   Review of Systems  Constitutional:  Negative for chills and fever.  HENT:  Negative for hearing loss and tinnitus.   Eyes:  Negative for blurred vision and double vision.  Respiratory:  Negative for cough and shortness of breath.   Cardiovascular:  Negative for chest pain and leg swelling.  Gastrointestinal:  Negative for abdominal pain, heartburn and nausea.       Colostomy in place with soft mushy stool. Midline and left lateral incisions packed with gauze. Scabbed puncture site and smaller incision RLQ healing well without drainage.   Genitourinary:  Positive for dysuria.  Musculoskeletal:  Positive for joint pain and myalgias.  Right knee and right elbow with moderate effusion. Both feet with 2+ edema with erythematous lesions and warm to touch. Unable to fully extend right knee or bilateral elbows due to pain.   Skin:  Negative for rash.  Neurological:  Positive for weakness. Negative for dizziness and headaches.  Psychiatric/Behavioral:  The patient is not nervous/anxious and  does not have insomnia.     History reviewed. No pertinent past medical history.   Past Surgical History:  Procedure Laterality Date   ABDOMINAL WALL DEFECT REPAIR N/A 08/15/2020   Procedure: REPAIR ABDOMINAL WALL/REEXPLORATION OF ABDOMINAL WALL, IRRIGATION AND DEBRIDEMENT;  Surgeon: Erroll Luna, MD;  Location: [facility];  Service: General;  Laterality: N/A;   LAPAROTOMY N/A 08/14/2020   Procedure: EXPLORATORY LAPAROTOMY, REPAIR PERFORATED DIVERTICULUM;  Surgeon: Kieth Brightly Arta Bruce, MD;  Location: [facility];  Service: General;  Laterality: N/A;   PARTIAL COLECTOMY N/A 08/12/2020   Procedure: EXPLORATORY LAPAROTOMY,PARTIAL COLECTOMY;  Surgeon: Mickeal Skinner, MD;  Location: [facility];  Service: General;  Laterality: N/A;    Family History  Adopted: Yes    Social History: Married.  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  Active and works out daily. Reports that he has never smoked. He has never been exposed to tobacco smoke. He has never used smokeless tobacco. He reports that he has a beer or mixed drinks 2-3 times a week.  He does not use drugs.   Allergies: No Known Allergies   Medications Prior to Admission  Medication Sig Dispense Refill   traMADol (ULTRAM) 50 MG tablet Take 50 mg by mouth as needed (pain).      Drug Regimen Review  Drug regimen was reviewed and remains appropriate with no significant issues identified    Home: Home Living Family/patient expects to be discharged to:: Private residence Living Arrangements: Spouse/significant other Available Help at Discharge: Family Type of Home: House Home Access: Stairs to enter Technical brewer of Steps: 4 Entrance Stairs-Rails: Right, Left Home Layout: Two level, Able to live on main level with bedroom/bathroom Bathroom Shower/Tub: Multimedia programmer: Standard Home Equipment: None  Lives With: Spouse   Functional History: Prior Function Level of Independence:  Independent Comments: Biochemist, clinical   Functional Status:  Mobility: Bed Mobility Overal bed mobility: Needs Assistance Bed Mobility: Supine to Sit Rolling: Min assist Sidelying to sit: Min assist Supine to sit: Mod assist Sit to supine: Mod assist, +2 for physical assistance, +2 for safety/equipment, HOB elevated (+3 pt's wife assisting with sit to supine transfer) Sit to sidelying: +2 for safety/equipment, +2 for physical assistance, Max assist General bed mobility comments: min A to support RLE (painful when flexed more than ~30*), VCs technique for roll and sidelying to sit, used bedrail, used gait belt looped on R foot as leg lifter Transfers Overall transfer level: Needs assistance Equipment used: Rolling walker (2 wheeled) Transfer via Lift Equipment: Stedy Transfers: Sit to/from Stand, Risk manager Sit to Stand: +2 physical assistance, Mod assist, From elevated surface Stand pivot transfers: Min assist, +2 physical assistance, +2 safety/equipment General transfer comment: VCs hand placement, mod A to power up, pt performed marching in place x 5 BLEs then took several pivotal steps to recliner. Trunk flexed throughout but able to come to ~15* from full upright position with increased time/cues. Ambulation/Gait Ambulation/Gait assistance: Min assist, +2 safety/equipment Gait Distance (Feet): 10 Feet Assistive device: Rolling walker (2 wheeled) Gait Pattern/deviations: Step-to pattern, Step-through pattern General Gait Details: slow to step  but steady with RW. Gait velocity: decr   ADL: ADL Overall ADL's : Needs assistance/impaired Eating/Feeding: NPO Grooming: Set up, Oral care, Wash/dry face, Brushing hair, Sitting Grooming Details (indicate cue type and reason): up in recliner to perform grooming tasks. Upper Body Bathing: Minimal assistance Upper Body Bathing Details (indicate cue type and reason): Needed assistance to wash R arm pit reporting difficulty with  R elbow due to pain. Lower Body Bathing: Moderate assistance, Sitting/lateral leans, Bed level Upper Body Dressing : Minimal assistance, Sitting Lower Body Dressing: Total assistance, Sitting/lateral leans Lower Body Dressing Details (indicate cue type and reason): Total As to don socks. Toilet Transfer: RW, BSC, Moderate assistance, +2 for physical assistance Toilet Transfer Details (indicate cue type and reason): Edge of bed elevated, able to power up to standing on first attempt this session with moderate assist of 2. Needs cues for hand and foot placement and for anterior weight shift. Once standing increased time needed to allow pt to accomplish upright posture and maximum assist to negotiate RW in correct proximity for safety and leverage.  Pt then able to pivot to recliner (simulated in prep for [facility] transfers) with increased time/effort, multimodal cues and Moderate assist of 2 people.  Pt lowered onto recliner with moderate assist of 2 with 2nd person needed to assist pt's RT foot forward prior to sitting to control RT knee pain. Toileting- Clothing Manipulation and Hygiene: Total assistance, Bed level Functional mobility during ADLs: Moderate assistance, +2 for physical assistance, Rolling walker, Cueing for sequencing, Cueing for safety General ADL Comments: Pt rated effort of bed mobility and up to chair was 10/10 on RPE.   Cognition: Cognition Overall Cognitive Status: Within Functional Limits for tasks assessed Orientation Level: Oriented X4 Cognition Arousal/Alertness: Awake/alert Behavior During Therapy: WFL for tasks assessed/performed Overall Cognitive Status: Within Functional Limits for tasks assessed General Comments: Anxious regarding pain.     Blood pressure 118/74, pulse 91, temperature 98.4 F (36.9 C), resp. rate 18, height '5\' 10"'$  (1.778 m), weight 75.4 kg, SpO2 100 %. Physical Exam Gen: no distress, normal appearing HEENT: oral mucosa pink and moist, NCAT Cardio:  Reg rate Chest: normal effort, normal rate of breathing Abd: abdominal incisions covered with dressings, changed 8/2. Colostomy in place Ext: no edema Psych: pleasant, normal affect Skin: intact Neuro:    Mental Status: He is alert and oriented to person, place, and time. MSK: knee and ankle joints are swollen and tender to palpation. ROM limited due to swelling   Results for orders placed or performed during the hospital encounter of 09/01/20 (from the past 48 hour(s))  Glucose, capillary     Status: Abnormal   Collection Time: 09/01/20  4:54 PM  Result Value Ref Range   Glucose-Capillary 134 (H) 70 - 99 mg/dL    Comment: Glucose reference range applies only to samples taken after fasting for at least 8 hours.  Glucose, capillary     Status: Abnormal   Collection Time: 09/01/20  9:02 PM  Result Value Ref Range   Glucose-Capillary 261 (H) 70 - 99 mg/dL    Comment: Glucose reference range applies only to samples taken after fasting for at least 8 hours.  Comprehensive metabolic panel     Status: Abnormal   Collection Time: 09/02/20  4:33 AM  Result Value Ref Range   Sodium 131 (L) 135 - 145 mmol/L   Potassium 4.2 3.5 - 5.1 mmol/L   Chloride 97 (L) 98 - 111 mmol/L   CO2 26 22 -  32 mmol/L   Glucose, Bld 134 (H) 70 - 99 mg/dL    Comment: Glucose reference range applies only to samples taken after fasting for at least 8 hours.   BUN 14 6 - 20 mg/dL   Creatinine, Ser 0.66 0.61 - 1.24 mg/dL   Calcium 8.6 (L) 8.9 - 10.3 mg/dL   Total Protein 5.7 (L) 6.5 - 8.1 g/dL   Albumin 1.6 (L) 3.5 - 5.0 g/dL   AST 22 15 - 41 U/L   ALT 29 0 - 44 U/L   Alkaline Phosphatase 156 (H) 38 - 126 U/L   Total Bilirubin 0.4 0.3 - 1.2 mg/dL   GFR, Estimated >60 >60 mL/min    Comment: (NOTE) Calculated using the CKD-EPI Creatinine Equation (2021)    Anion gap 8 5 - 15    Comment: Performed at [facility], [place] [address] [place], [place] [address]  CBC WITH DIFFERENTIAL     Status: Abnormal    Collection Time: 09/02/20  4:33 AM  Result Value Ref Range   WBC 7.6 4.0 - 10.5 K/uL   RBC 2.79 (L) 4.22 - 5.81 MIL/uL   Hemoglobin 8.2 (L) 13.0 - 17.0 g/dL   HCT 25.8 (L) 39.0 - 52.0 %   MCV 92.5 80.0 - 100.0 fL   MCH 29.4 26.0 - 34.0 pg   MCHC 31.8 30.0 - 36.0 g/dL   RDW 15.1 11.5 - 15.5 %   Platelets 532 (H) 150 - 400 K/uL   nRBC 0.0 0.0 - 0.2 %   Neutrophils Relative % 72 %   Neutro Abs 5.5 1.7 - 7.7 K/uL   Lymphocytes Relative 12 %   Lymphs Abs 0.9 0.7 - 4.0 K/uL   Monocytes Relative 8 %   Monocytes Absolute 0.6 0.1 - 1.0 K/uL   Eosinophils Relative 6 %   Eosinophils Absolute 0.4 0.0 - 0.5 K/uL   Basophils Relative 1 %   Basophils Absolute 0.1 0.0 - 0.1 K/uL   Immature Granulocytes 1 %   Abs Immature Granulocytes 0.06 0.00 - 0.07 K/uL    Comment: Performed at [facility], [address], [place], [place] [address]  Magnesium     Status: None   Collection Time: 09/02/20  4:33 AM  Result Value Ref Range   Magnesium 1.8 1.7 - 2.4 mg/dL    Comment: Performed at [facility] [address] [place], [place] [address]  Uric acid     Status: None   Collection Time: 09/02/20  4:33 AM  Result Value Ref Range   Uric Acid, Serum 3.7 3.7 - 8.6 mg/dL    Comment: Performed at [facility] [address], [place], [place] [address]  Sedimentation rate     Status: Abnormal   Collection Time: 09/02/20  4:33 AM  Result Value Ref Range   Sed Rate 137 (H) 0 - 16 mm/hr    Comment: Performed at [facility] [address] [place],  [address]  C-reactive protein     Status: Abnormal   Collection Time: 09/02/20  4:33 AM  Result Value Ref Range   CRP 17.6 (H) <1.0 mg/dL    Comment: Performed at [facility] [address] [place], [place] [address]  Glucose, capillary     Status: Abnormal   Collection Time: 09/02/20  5:39 AM  Result Value Ref Range   Glucose-Capillary 138 (H) 70 - 99 mg/dL    Comment: Glucose reference  range applies only to  samples taken after fasting for at least 8 hours.   No results found.     Medical Problem List and Plan: 1.  Debility  -patient may shower but wound must be covered  -ELOS/Goals: 2-3 weeks MinA  -Admit to CIR 2.  L-gastroc DVT/Antithrombotics: -DVT/anticoagulation:  Pharmaceutical: Other (comment)-Eliquis  -antiplatelet therapy: N/A 3. Pain Management:  Continue Oxycodone prn- currently requiring '15mg'$  due to pain from swelling --IV dilaudid prior to dressing changes BID --Robaxin 1000 mg tid 4. Mood: LCSW to follow for evaluation and support.   -antipsychotic agents: N/AA 5. Neuropsych: This patient is capable of making decisions on his own behalf. 6. Skin/Wound Care: Damp to dry dressing changes twice daily.  --IV Dilaudid prior to dressing change. 7. Fluids/Electrolytes/Nutrition: Monitor intake/output.   --Continue Juven twice daily.   --Add vitamin C and zinc to help promote wound healing. 8. Wound infection: Has completed 9. Hyponatremia: Continue 1800 cc/FR- Stable around 130.   --recheck CMET in am.  10. Acute blood loss anemia: Monitor with serial checks.   --recheck CBC in am.  11.  Polyarthralgias with effusion: Continue Celebrex daily.  Await Ortho input. Lasix '10mg'$ .  12. New diagnosis T2DM: Hgb A1C-7.4. Discontinue Ensure  --monitor BS ac/hs and use SSI for elevated BS.   --consult RD for dietary education.   I have personally performed a face to face diagnostic evaluation, including, but not limited to relevant history and physical exam findings, of this patient and developed relevant assessment and plan.  Additionally, I have reviewed and concur with the physician assistant's documentation above.  Bary Leriche, PA-C   Izora Ribas, MD 09/02/2020

## 2020-09-03 DIAGNOSIS — R5381 Other malaise: Secondary | ICD-10-CM | POA: Diagnosis not present

## 2020-09-03 LAB — GLUCOSE, CAPILLARY
Glucose-Capillary: 133 mg/dL — ABNORMAL HIGH (ref 70–99)
Glucose-Capillary: 141 mg/dL — ABNORMAL HIGH (ref 70–99)
Glucose-Capillary: 166 mg/dL — ABNORMAL HIGH (ref 70–99)
Glucose-Capillary: 162 mg/dL — ABNORMAL HIGH (ref 70–99)

## 2020-09-03 LAB — ANA: Anti Nuclear Antibody (ANA): NEGATIVE

## 2020-09-03 MED ORDER — BOOST / RESOURCE BREEZE PO LIQD CUSTOM
1.0000 | Freq: Two times a day (BID) | ORAL | Status: DC
  Administered 2020-09-03 – 2020-09-06 (×7): 1 via ORAL

## 2020-09-03 MED ORDER — VITAMIN D (ERGOCALCIFEROL) 1.25 MG (50000 UNIT) PO CAPS
50000.0000 [IU] | ORAL_CAPSULE | ORAL | Status: DC
  Administered 2020-09-03 – 2020-09-17 (×3): 50000 [IU] via ORAL
  Filled 2020-09-03 (×3): qty 1

## 2020-09-03 MED ORDER — FUROSEMIDE 20 MG PO TABS
20.0000 mg | ORAL_TABLET | Freq: Every day | ORAL | Status: DC
  Administered 2020-09-04 – 2020-09-14 (×11): 20 mg via ORAL
  Filled 2020-09-03 (×11): qty 1

## 2020-09-03 NOTE — Progress Notes (Addendum)
Physical Therapy Session Note  Patient Details  Name: Roger Brennan MRN: [id number] Date of Birth: 31-Dec-1960  Today's Date: 09/03/2020 PT Individual Time: 0917-1015 PT Individual Time Calculation (min): 58 min   Short Term Goals: Week 1:  PT Short Term Goal 1 (Week 1): Pt will perform STS ModA PT Short Term Goal 2 (Week 1): Pt will ambulate 25 ft MinA PT Short Term Goal 3 (Week 1): Pt will initiate stair training PT Short Term Goal 4 (Week 1): Pt will improve RLE ROM to 75+ flexion  Skilled Therapeutic Interventions/Progress Updates:    Pt received in bed and agreeable to therapy. Pt states pain in knees R>L 5-6/10 and feeling more stiff this morning.   Attempted STS with bed elevated for stand pivot, pt unable to complete 2/2 increased pain/stiffness. Slide board transfer bed>wc>mat table completed with MinA. Verbal and visual cuing for hand placement and sequencing.  Supine with trunk elevation via wedge exercises completed BLE: Heel slides x10 -therapist support at knee/ankle for RLE SAQ 12x, 3 second hold  Manual therapy with soft tissue massage and gentle grade 1/2 anterior/posterior glides RLE for pain management x8' interspersed with heel slides/SAQ Improved right knee ROM to 75 (65 degrees yesterday).   STS x 2 from elevated mat table ModA facilitating shoulder/trunk/hip upright alignment and verbal cuing. Pt ambulated 12f RW minA with decreased velocity, bilateral weight shifting, bilateral hip/knee flexion, and foot clearance 2/2 pain. Noted increase in utilization of BUE support on RW to offload lower extremities. Verbal cuing for upright posture throughout. Pt wheeled back to room for time management.  Pt requires increased time with functional mobility 2/2 pain/pain limited ROM.   Pt sitting in wheelchair, brakes locked, seatbelt alarm on, and call bell within reach.   Therapy Documentation Precautions:  Precautions Precautions: Fall Precaution Comments: colostomy,  bandages over full abdomen, R knee ROM limitations Restrictions Weight Bearing Restrictions: No  Pain: Pain Assessment Pain Scale: 0-10 Pain Score: 5  Pain Location: Knee Pain Orientation: Right;Left  Therapy/Group: Individual Therapy  Deva Ron, SPT 09/03/2020, 12:11 PM

## 2020-09-03 NOTE — Progress Notes (Signed)
Patient spoke with his Dr. Ranell Patrick this morning in reference to the Steroid shot ordered by ortho. Patient stated at that this time he will like to put everything on hold at this time and let us know when he needs.

## 2020-09-03 NOTE — Progress Notes (Signed)
Physical Therapy Session Note  Patient Details  Name: Roger Brennan MRN: [id number] Date of Birth: February 19, 1960  Today's Date: 09/03/2020 PT Individual Time: 1703-1730 PT Individual Time Calculation (min): 27 min   Short Term Goals: Week 1:  PT Short Term Goal 1 (Week 1): Pt will perform STS ModA PT Short Term Goal 2 (Week 1): Pt will ambulate 25 ft MinA PT Short Term Goal 3 (Week 1): Pt will initiate stair training PT Short Term Goal 4 (Week 1): Pt will improve RLE ROM to 75+ flexion Week 2:     Skilled Therapeutic Interventions/Progress Updates:   Pt received supine in bed and agreeable to PT at bed level. Pt performed hip/knee flexion/extension within pain free range x 8. AAROM into DF on BLE with over pressure at end range. Noted to be lacked ~5 deg from neutral on the RLE and to neutral only on the LLE. SLR x 8 with AAROM, hip abduction x 8 with AAROM. Isometric hip adduction x 8. Pt required multiple therapeutic rest breaks due to mild ankle and knee pain as well as increasing fatigue. Pt left supine in bed with call bell in reach and all needs met.       Therapy Documentation Precautions:  Precautions Precautions: Fall Precaution Comments: colostomy, bandages over full abdomen, R knee ROM limitations Restrictions Weight Bearing Restrictions: No   Pain: Pain Assessment Pain Scale: PAINAD     Therapy/Group: Individual Therapy  Lorie Phenix 09/03/2020, 5:43 PM

## 2020-09-03 NOTE — Progress Notes (Signed)
Patient ID: Roger Brennan, male   DOB: 05/05/1960, 60 y.o.   MRN: [id number] Met with the patient and his wife to introduce self and the role of the nurse CM. Reviewed educational needs and wounds/incisions, etc. Patient is hands off at present with care. Wife notes she has watched both dressing changes and ostomy care. Both report hired help to complete care at discharge. Reviewed ostomy related dietary modifications recommended with patient and wife. Handouts and handbooks at bedside for reference and review. Continue to follow along to discharge to address educational needs and collaborate with the SW to facilitate preparation for discharge. Sharp, Deborah B, RN  

## 2020-09-03 NOTE — Progress Notes (Signed)
PROGRESS NOTE   Subjective/Complaints: Pain and swelling much improved today, is urinating due to lasix but ok to increase dose to aid in swelling reduction. Will increase to 42m. Discussed risks and benefits of steroid shot and will defer now given improvements with lasix. Walked 15 feet today!  ROS: +bilateral pain in knees and ankles, improving range of motion in elbow   Objective:   No results found. Recent Labs    09/01/20 0452 09/02/20 0433  WBC 8.5 7.6  HGB 8.2* 8.2*  HCT 26.1* 25.8*  PLT 484* 532*   Recent Labs    09/02/20 0433  NA 131*  K 4.2  CL 97*  CO2 26  GLUCOSE 134*  BUN 14  CREATININE 0.66  CALCIUM 8.6*    Intake/Output Summary (Last 24 hours) at 09/03/2020 1147 Last data filed at 09/03/2020 0853 Gross per 24 hour  Intake 354 ml  Output 2625 ml  Net -2271 ml     Pressure Injury 09/01/20 Buttocks Left Deep Tissue Pressure Injury - Purple or maroon localized area of discolored intact skin or blood-filled blister due to damage of underlying soft tissue from pressure and/or shear. Purple colored area (Active)  09/01/20 1530  Location: Buttocks  Location Orientation: Left  Staging: Deep Tissue Pressure Injury - Purple or maroon localized area of discolored intact skin or blood-filled blister due to damage of underlying soft tissue from pressure and/or shear.  Wound Description (Comments): Purple colored area  Present on Admission: Yes    Physical Exam: Vital Signs Blood pressure 137/83, pulse 93, temperature 98.5 F (36.9 C), resp. rate 17, height _0  (1.778 m), weight 75.3 kg, SpO2 99 %. Gen: no distress, normal appearing HEENT: oral mucosa pink and moist, NCAT Cardio: Reg rate Chest: normal effort, normal rate of breathing Abd: abdominal incisions clean without odor. Colostomy in place Ext: no edema Psych: pleasant, normal affect Skin: intact Neuro:    Mental Status: He is alert and  oriented to person, place, and time. MSK: knee and ankle joints are swollen and tender to palpation. ROM limited due to swelling    Assessment/Plan: 1. Functional deficits which require 3+ hours per day of interdisciplinary therapy in a comprehensive inpatient rehab setting. Physiatrist is providing close team supervision and 24 hour management of active medical problems listed below. Physiatrist and rehab team continue to assess barriers to discharge/monitor patient progress toward functional and medical goals  Care Tool:  Bathing    Body parts bathed by patient: Right arm, Left arm, Abdomen, Front perineal area, Chest, Right upper leg, Left upper leg, Face   Body parts bathed by helper: Right lower leg, Left lower leg, Buttocks     Bathing assist Assist Level: Moderate Assistance - Patient 50 - 74%     Upper Body Dressing/Undressing Upper body dressing   What is the patient wearing?: Pull over shirt    Upper body assist Assist Level: Maximal Assistance - Patient 25 - 49%    Lower Body Dressing/Undressing Lower body dressing      What is the patient wearing?: Pants     Lower body assist Assist for lower body dressing: Maximal Assistance - Patient 25 -  49%     Toileting Toileting    Toileting assist Assist for toileting: Set up assist Assistive Device Comment: Urinal   Transfers Chair/bed transfer  Transfers assist     Chair/bed transfer assist level: Maximal Assistance - Patient 25 - 49%     Locomotion Ambulation   Ambulation assist      Assist level: Minimal Assistance - Patient > 75% Assistive device: Walker-rolling Max distance: 10   Walk 10 feet activity   Assist     Assist level: Minimal Assistance - Patient > 75% Assistive device: Walker-rolling   Walk 50 feet activity   Assist Walk 50 feet with 2 turns activity did not occur: Safety/medical concerns         Walk 150 feet activity   Assist Walk 150 feet activity did not  occur: Safety/medical concerns         Walk 10 feet on uneven surface  activity   Assist Walk 10 feet on uneven surfaces activity did not occur: Safety/medical concerns         Wheelchair     Assist Will patient use wheelchair at discharge?: No      Wheelchair assist level: Contact Guard/Touching assist Max wheelchair distance: 60    Wheelchair 50 feet with 2 turns activity    Assist        Assist Level: Contact Guard/Touching assist   Wheelchair 150 feet activity     Assist  Wheelchair 150 feet activity did not occur: Safety/medical concerns       Blood pressure 137/83, pulse 93, temperature 98.5 F (36.9 C), resp. rate 17, height _0  (1.778 m), weight 75.3 kg, SpO2 99 %.  Medical Problem List and Plan: 1.  Debility             -patient may shower but wound must be covered             -ELOS/Goals: 2-3 weeks MinA          Continue CIR 2.  L-gastroc DVT: continue Eliquis             -antiplatelet therapy: N/A 3. Pain from bilateral lower extremity swelling:  Continue Oxycodone prn- currently requiring 63m due to pain from swelling --IV dilaudid prior to dressing changes BID --Robaxin 1000 mg tid 4. Mood: LCSW to follow for evaluation and support.             -antipsychotic agents: N/AA 5. Neuropsych: This patient is capable of making decisions on his own behalf. 6. Abdominal incisions (3) and colostomy: Damp to dry dressing changes twice daily. --IV Dilaudid prior to dressing change. 7. Fluids/Electrolytes/Nutrition: Monitor intake/output.   --Continue Juven twice daily.   --Add vitamin C and zinc to help promote wound healing. Add Boost as per patient's preference to miz with Juven 8. Wound infection: Has completed 9. Hyponatremia: Continue 1800 cc/FR- Stable around 130. Admission CMP stable- discussed with patient.  10. Acute blood loss anemia: Monitor with serial checks.             --CBC reviewed and stable 11.  Polyarthralgias with  effusion: Continue Celebrex daily.  Appreciate ortho eval- will defer steroid injection since pain and swelling have improved with Lasix 124m Will increase to 2026miscussed with patient that ESR and CRP are trending downward. Repeat BMP tomorrow to check Cr and K+ while on Lasix.  12. New diagnosis T2DM: Hgb A1C-7.4. Discontinue Ensure             --  monitor BS ac/hs and use SSI for elevated BS.             --consult RD for dietary education.    LOS: 2 days A FACE TO FACE EVALUATION WAS PERFORMED  Clide Deutscher Danaija Eskridge 09/03/2020, 11:47 AM

## 2020-09-03 NOTE — Progress Notes (Signed)
Physical Therapy Session Note  Patient Details  Name: Roger Brennan MRN: [id number] Date of Birth: 1960-05-01  Today's Date: 09/03/2020 PT Individual Time: 1030-1126 PT Individual Time Calculation (min): 56 min   Short Term Goals: Week 1:  PT Short Term Goal 1 (Week 1): Pt will perform STS ModA PT Short Term Goal 2 (Week 1): Pt will ambulate 25 ft MinA PT Short Term Goal 3 (Week 1): Pt will initiate stair training PT Short Term Goal 4 (Week 1): Pt will improve RLE ROM to 75+ flexion  Skilled Therapeutic Interventions/Progress Updates:   Received pt sitting in WC, pt agreeable to PT treatment, and reported pain 5/10 in bilateral knees and abdomen. RN notified but reported pt not due for more pain medication yet. Repositioning, rest breaks, and distraction done to reduce pain levels. Session with emphasis on functional mobility, transfers, generalized strengthening and ROM, and gait training. Pt transported to dayroom in [facility] total A for time management and energy conservation purposes and transferred sit<>stand with RW and max A. Stand<>pivot WC<>Nustep with RW and min A and pt required max/total A and significantly increased time to get LE's positioned on Nusep foot plates due to decreased knee flexion R>L. Pt performed BUE/LE strengthening on Nustep at workload 1 for 7 minutes with emphasis on knee ROM R>L and improved activity tolerance. Sit<>stand from Nustep with heavy mod A and ambulated 28f with RW and min A to WC. Pt demonstrates decreased cadence, narrow BOS, flexed trunk and downward gaze, decreased knee flexion, and decreased step/stride length. [facility] and requested to return to bed. Pt transferred WC<>bed via slideboard due to fatigue with min A and total A to place board. Pt required cues for hand placement, head/hip relationship, and scooting technique. Concluded session with pt sitting EOB with wife and MD present at bedside discussing medical  conditions. All needs within reach and bed alarm on.   Of note, pt requires increased time with all functional tasks (particularly standing) due to pain and anticipation of increased pain.   Therapy Documentation Precautions:  Precautions Precautions: Fall Precaution Comments: colostomy, bandages over full abdomen, R knee ROM limitations Restrictions Weight Bearing Restrictions: No  Therapy/Group: Individual Therapy AMandela AbbotPT, DPT   09/03/2020, 7:32 AM

## 2020-09-03 NOTE — Progress Notes (Signed)
Occupational Therapy Note  Patient Details  Name: Roger Brennan MRN: [id number] Date of Birth: 1960/10/21  Today's Date: 09/03/2020 OT Missed Time: 57 Minutes Missed Time Reason: Other (comment)  Pt resting in bed upon arrival.  Pt reported that his lunch had mysteriously been cancelled and he had to reorder. Pt awaiting lunch tray and requested that his therapy be rescheduled. Pt missed 45 mins skilled OT services. Will check back later.   Leotis Shames [facility] 09/03/2020, 1:26 PM

## 2020-09-04 DIAGNOSIS — R5381 Other malaise: Secondary | ICD-10-CM | POA: Diagnosis not present

## 2020-09-04 LAB — BASIC METABOLIC PANEL
Anion gap: 8 (ref 5–15)
BUN: 14 mg/dL (ref 6–20)
CO2: 27 mmol/L (ref 22–32)
Calcium: 8.8 mg/dL — ABNORMAL LOW (ref 8.9–10.3)
Chloride: 95 mmol/L — ABNORMAL LOW (ref 98–111)
Creatinine, Ser: 0.72 mg/dL (ref 0.61–1.24)
GFR, Estimated: >60 mL/min (ref >60)
Glucose, Bld: 140 mg/dL — ABNORMAL HIGH (ref 70–99)
Potassium: 4.3 mmol/L (ref 3.5–5.1)
Sodium: 130 mmol/L — ABNORMAL LOW (ref 135–145)

## 2020-09-04 LAB — GLUCOSE, CAPILLARY
Glucose-Capillary: 125 mg/dL — ABNORMAL HIGH (ref 70–99)
Glucose-Capillary: 153 mg/dL — ABNORMAL HIGH (ref 70–99)
Glucose-Capillary: 201 mg/dL — ABNORMAL HIGH (ref 70–99)
Glucose-Capillary: 186 mg/dL — ABNORMAL HIGH (ref 70–99)

## 2020-09-04 MED ORDER — DICLOFENAC SODIUM 1 % EX GEL
2.0000 g | Freq: Four times a day (QID) | CUTANEOUS | Status: DC
  Administered 2020-09-04 – 2020-09-21 (×33): 2 g via TOPICAL
  Filled 2020-09-04: qty 100

## 2020-09-04 NOTE — IPOC Note (Signed)
Overall Plan of Care [facility]) Patient Details Name: Roger Brennan MRN: [id number] DOB: Jul 18, 1960  Admitting Diagnosis: [facility] Problems: Principal Problem:   Debility Active Problems:   Malnutrition of moderate degree     Functional Problem List: Nursing Bowel, Medication Management, Safety, Pain, Endurance, Skin Integrity  PT Balance, Behavior, Edema, Endurance, Motor, Nutrition, Pain, Safety, Skin Integrity  OT Balance, Endurance, Motor, Pain, Edema, Skin Integrity  SLP    TR         Basic ADL's: OT Bathing, Dressing, Toileting     Advanced  ADL's: OT       Transfers: PT Bed Mobility, Bed to Chair, Car, Furniture, Floor  OT Toilet, Metallurgist: PT Ambulation, Emergency planning/management officer, Stairs     Additional Impairments: OT None  SLP        TR      Anticipated Outcomes Item Anticipated Outcome  Self Feeding no goal set  Swallowing      Basic self-care  supervision  Toileting  supervision   Bathroom Transfers supervision  Bowel/Bladder  manage bowel with min assist  Transfers  Modified Independent  Locomotion  Modified Independent with LRAD  Communication     Cognition     Pain  at or below level 4  Safety/Judgment  maintain safety wtih cues/reminders   Therapy Plan: PT Intensity: Minimum of 1-2 x/day ,45 to 90 minutes PT Frequency: 5 out of 7 days PT Duration Estimated Length of Stay: 18-21 days OT Intensity: Minimum of 1-2 x/day, 45 to 90 minutes OT Frequency: 5 out of 7 days OT Duration/Estimated Length of Stay: 18-21 days     Due to the current state of emergency, patients may not be receiving their 3-hours of Medicare-mandated therapy.   Team Interventions: Nursing Interventions Patient/Family Education, Bowel Management, Pain Management, Skin Care/Wound Management, Medication Management, Disease Management/Prevention, Discharge Planning  PT interventions Ambulation/gait training, Cognitive  remediation/compensation, Discharge planning, DME/adaptive equipment instruction, Training and development officer, Community reintegration, Disease management/prevention, Neuromuscular re-education, Functional electrical stimulation, Patient/family education, Skin care/wound management, Stair training, Therapeutic Exercise, UE/LE Coordination activities, Wheelchair propulsion/positioning, Functional mobility training, Pain management, Psychosocial support, Splinting/orthotics, Therapeutic Activities, UE/LE Strength taining/ROM, Visual/perceptual remediation/compensation  OT Interventions Balance/vestibular training, Discharge planning, Pain management, Self Care/advanced ADL retraining, Therapeutic Activities, UE/LE Coordination activities, Functional mobility training, Patient/family education, Community reintegration, Engineer, drilling, Wheelchair propulsion/positioning, UE/LE Strength taining/ROM, Therapeutic Exercise, Skin care/wound managment  SLP Interventions    TR Interventions    SW/CM Interventions Discharge Planning, Psychosocial Support, Patient/Family Education, Disease Management/Prevention   Barriers to Discharge MD  Medical stability  Nursing Decreased caregiver support, Home environment access/layout, Wound Care 2 level 4ste  left rail, B+B on main w spouse  PT Wound Care, Lack of/limited family support    OT      SLP      SW IV antibiotics, Wound Care, Other (comments) Colostomy, JP Drain, Dressing Changes   Team Discharge Planning: Destination: PT-Home ,OT- Home , SLP-  Projected Follow-up: PT-None, OT-  Home health OT, SLP-  Projected Equipment Needs: PT-To be determined, OT- To be determined, SLP-  Equipment Details: PT- , OT-  Patient/family involved in discharge planning: PT- Patient,  OT-Patient, SLP-   MD ELOS: 2-3 weeks MinA Medical Rehab Prognosis:  Excellent Assessment: Roger Brennan is a very pleasant 60 year old man  Who is admitted to CIR with  debility following abdominal surgery. Course has been complicated by severe swelling in his right knee and right ankle, with  associated pain, that has been responding well to diuresis. Medications are being managed, and labs and vitals are being monitored regularly.     See Team Conference Notes for weekly updates to the plan of care

## 2020-09-04 NOTE — Progress Notes (Signed)
Occupational Therapy Session Note  Patient Details  Name: Roger Brennan MRN: [id number] Date of Birth: 05-26-1960  Today's Date: 09/04/2020 OT Individual Time: 1302-1330 OT Individual Time Calculation (min): 28 min    Short Term Goals: Week 1:  OT Short Term Goal 1 (Week 1): Pt will use LRAD to don LB clothing with mod A OT Short Term Goal 2 (Week 1): Pt will complete sit > stand with mod A OT Short Term Goal 3 (Week 1): Pt will complete stand pivot transfer with mod A  Skilled Therapeutic Interventions/Progress Updates:    Pt greeted supine in bed agreeable to OT intervention. Session focus on functional mobility and BLE strengthening to increase strength and overall endurance for higher level BADLs.  Pt currently requires CGA for bed mobility. Pt completed light BLE therex/ stretching from EOB including LAQ, seated marches and ankle pumps, x10 reps.  MOD A for sit<>stand from EOB with RW with increased time needed to sit<>stand. Pt completed functional mobility within pts room ~ 7f with RW and MIN A. Pt returned to supine with CGA with pt left supine in bed with bed alarm activated and all needs within reach.  Therapy Documentation Precautions:  Precautions Precautions: Fall Precaution Comments: colostomy, bandages over full abdomen, R knee ROM limitations Restrictions Weight Bearing Restrictions: No  Pain: Pt reports pain in R knee, offered rest breaks and repositioning as pain mgmt strategy.    Therapy/Group: Individual Therapy  MPrecious Haws8/05/2020, 1:31 PM

## 2020-09-04 NOTE — Progress Notes (Signed)
Physical Therapy Session Note  Patient Details  Name: Roger Brennan MRN: [id number] Date of Birth: 07-14-1960  Today's Date: 09/04/2020 PT Individual Time: 0915-1010 PT Individual Time Calculation (min): 55 min   Short Term Goals: Week 1:  PT Short Term Goal 1 (Week 1): Pt will perform STS ModA PT Short Term Goal 2 (Week 1): Pt will ambulate 25 ft MinA PT Short Term Goal 3 (Week 1): Pt will initiate stair training PT Short Term Goal 4 (Week 1): Pt will improve RLE ROM to 75+ flexion  Skilled Therapeutic Interventions/Progress Updates:   Received pt sitting in WC, pt agreeable to PT treatment, and reported pain in bilateral knees (unrated) but reported he's not due for more pain medication until 10am. Repositioning, maximal rest breaks, and distraction done to reduce pain levels. Session with emphasis on functional mobility/transfers, generalized strengthening, and bilateral knee ROM. Pt performed WC mobility 161f using BUE and supervision to therapy gym. [facility] but pt ultimately unable to come into upright position due to pain and difficulty extending knees. Pt performed x 3 slideboard transfers with min A throughout session with total A to place board and min cues for technique and hand placement. Pt transferred sit<>semi-reclined on wedge and semi-reclined<>short sitting on mat with min/mod A for LE management due to pain. Pt lacking ~15 degrees of R knee extension therefore performed grade 1-2 AP glides at knee joint with towel underneath R ankle with emphasis on knee extension -noticeable improvements in ROM afterwards with pt only lacking ~5 degrees. Worked on quad squeezes on LLE with towel roll underneath ankle x10 reps with verbal/tactile cues to "squeeze therapist's fingers into mat". Pt initially lacking ~40 degrees extension but after exercises able to achieve almost full knee extension. Then performed active assisted heel slides on RLE to tolerance (only 1  rep). [facility] and requested to return to bed. Concluded session with pt semi-reclined in bed, needs within reach, and bed alarm on. Positioned LLE with towel roll underneath ankle to promote knee extension and notified RN of pt's request for pain medication.   Of note, pt extremely limited by pain during session and required significantly increased time with all activities.   Therapy Documentation Precautions:  Precautions Precautions: Fall Precaution Comments: colostomy, bandages over full abdomen, R knee ROM limitations Restrictions Weight Bearing Restrictions: No   Therapy/Group: Individual Therapy ABrier ChrostowskiPT, DPT   09/04/2020, 7:27 AM

## 2020-09-04 NOTE — Progress Notes (Addendum)
PROGRESS NOTE   Subjective/Complaints: His swelling has greatly improved and he took 15 steps yesterday!! Continues to have worst swelling in right ankle- compression garment scan be painful so discussed with nursing elevating and icing and will continue diuresis- Cr stable  ROS: +bilateral pain in knees and ankles, improving range of motion in elbow, +frequent urination (likely from Lasix)   Objective:   No results found. Recent Labs    09/02/20 0433  WBC 7.6  HGB 8.2*  HCT 25.8*  PLT 532*   Recent Labs    09/02/20 0433 09/04/20 0438  NA 131* 130*  K 4.2 4.3  CL 97* 95*  CO2 26 27  GLUCOSE 134* 140*  BUN 14 14  CREATININE 0.66 0.72  CALCIUM 8.6* 8.8*    Intake/Output Summary (Last 24 hours) at 09/04/2020 0943 Last data filed at 09/04/2020 0859 Gross per 24 hour  Intake 236 ml  Output 1725 ml  Net -1489 ml     Pressure Injury 09/01/20 Buttocks Left Deep Tissue Pressure Injury - Purple or maroon localized area of discolored intact skin or blood-filled blister due to damage of underlying soft tissue from pressure and/or shear. Purple colored area (Active)  09/01/20 1530  Location: Buttocks  Location Orientation: Left  Staging: Deep Tissue Pressure Injury - Purple or maroon localized area of discolored intact skin or blood-filled blister due to damage of underlying soft tissue from pressure and/or shear.  Wound Description (Comments): Purple colored area  Present on Admission: Yes    Physical Exam: Vital Signs Blood pressure 134/77, pulse 91, temperature 98.4 F (36.9 C), resp. rate 18, height $RemoveBe'5\' 10"'MupVzXoub$  (1.778 m), weight 73.5 kg, SpO2 98 %. Gen: no distress, normal appearing HEENT: oral mucosa pink and moist, NCAT Cardio: Reg rate Chest: normal effort, normal rate of breathing Abd: abdominal incisions clean without odor. Colostomy in place Ext: no edema Psych: pleasant, normal affect Skin: intact Neuro:     Mental Status: He is alert and oriented to person, place, and time. MSK: knee and ankle joints are swollen and tender to palpation. ROM limited due to swelling    Assessment/Plan: 1. Functional deficits which require 3+ hours per day of interdisciplinary therapy in a comprehensive inpatient rehab setting. Physiatrist is providing close team supervision and 24 hour management of active medical problems listed below. Physiatrist and rehab team continue to assess barriers to discharge/monitor patient progress toward functional and medical goals  Care Tool:  Bathing    Body parts bathed by patient: Right arm, Left arm, Abdomen, Front perineal area, Chest, Right upper leg, Left upper leg, Face   Body parts bathed by helper: Right lower leg, Left lower leg, Buttocks     Bathing assist Assist Level: Moderate Assistance - Patient 50 - 74%     Upper Body Dressing/Undressing Upper body dressing   What is the patient wearing?: Pull over shirt    Upper body assist Assist Level: Contact Guard/Touching assist    Lower Body Dressing/Undressing Lower body dressing      What is the patient wearing?: Pants     Lower body assist Assist for lower body dressing: Maximal Assistance - Patient 25 - 49%  Toileting Toileting    Toileting assist Assist for toileting: Set up assist Assistive Device Comment: Urinal   Transfers Chair/bed transfer  Transfers assist     Chair/bed transfer assist level: Minimal Assistance - Patient > 75% (slide board)     Locomotion Ambulation   Ambulation assist      Assist level: Minimal Assistance - Patient > 75% Assistive device: Walker-rolling Max distance: 9ft   Walk 10 feet activity   Assist     Assist level: Minimal Assistance - Patient > 75% Assistive device: Walker-rolling   Walk 50 feet activity   Assist Walk 50 feet with 2 turns activity did not occur: Safety/medical concerns         Walk 150 feet activity   Assist  Walk 150 feet activity did not occur: Safety/medical concerns         Walk 10 feet on uneven surface  activity   Assist Walk 10 feet on uneven surfaces activity did not occur: Safety/medical concerns         Wheelchair     Assist Will patient use wheelchair at discharge?: No      Wheelchair assist level: Contact Guard/Touching assist Max wheelchair distance: 60    Wheelchair 50 feet with 2 turns activity    Assist        Assist Level: Contact Guard/Touching assist   Wheelchair 150 feet activity     Assist  Wheelchair 150 feet activity did not occur: Safety/medical concerns       Blood pressure 134/77, pulse 91, temperature 98.4 F (36.9 C), resp. rate 18, height 5\' 10"  (1.778 m), weight 73.5 kg, SpO2 98 %.  Medical Problem List and Plan: 1.  Debility             -patient may shower but wound must be covered             -ELOS/Goals: 2-3 weeks MinA          Continue CIR 2.  L-gastroc DVT: continue Eliquis. Will assess Doppler of right lower extremity as well given persistent ankle swelling.              -antiplatelet therapy: N/A 3. Pain from bilateral lower extremity swelling:  Continue Oxycodone prn- currently requiring 15mg  due to pain from swelling --IV dilaudid prior to dressing changes BID --Robaxin 1000 mg tid Discussed with nursing: lease be very vigilant about keeping patient's legs elevated when he is in his room, and icing 15 minutes three times per day to his right knee and right ankle even if he does not ask Cr stable on Lasix- continue low dose supplementation. See #11 Add voltaren gel scheduled for right knee 4. Mood: LCSW to follow for evaluation and support.             -antipsychotic agents: N/AA 5. Neuropsych: This patient is capable of making decisions on his own behalf. 6. Abdominal incisions (3) and colostomy: Damp to dry dressing changes twice daily. --IV Dilaudid prior to dressing change. 7. Fluids/Electrolytes/Nutrition:  Monitor intake/output.   --Continue Juven twice daily.   --Add vitamin C and zinc to help promote wound healing. Add Boost as per patient's preference to mix with Juven 8. Wound infection: Has completed 9. Hyponatremia: Continue 1800 cc/FR- Stable around 130. Admission CMP stable- discussed with patient.  10. Acute blood loss anemia: Monitor with serial checks.             --CBC reviewed and stable 11.  Polyarthralgias with effusion:  Continue Celebrex daily.  Appreciate ortho eval- will defer steroid injection since pain and swelling have improved with Lasix $RemoveBe'10mg'xZjBSfTgE$ . Will increase to $RemoveBef'20mg'DbIgUZIGwS$  Discussed with patient that ESR and CRP are trending downward. Cr and K+ stable on Lasix 12. New diagnosis T2DM: Hgb A1C-7.4. Discontinue Ensure             --monitor BS ac/hs and use SSI for elevated BS.             --consult RD for dietary education.  --provided with list of foods that are good for diabetes.     LOS: 3 days A FACE TO FACE EVALUATION WAS PERFORMED  Roger Brennan 09/04/2020, 9:43 AM

## 2020-09-04 NOTE — Progress Notes (Signed)
Physical Therapy Session Note  Patient Details  Name: Roger Brennan MRN: [id number] Date of Birth: July 12, 1960  Today's Date: 09/04/2020 PT Individual Time: [id number] PT Individual Time Calculation (min): 62 min   Short Term Goals: Week 1:  PT Short Term Goal 1 (Week 1): Pt will perform STS ModA PT Short Term Goal 2 (Week 1): Pt will ambulate 25 ft MinA PT Short Term Goal 3 (Week 1): Pt will initiate stair training PT Short Term Goal 4 (Week 1): Pt will improve RLE ROM to 75+ flexion  Skilled Therapeutic Interventions/Progress Updates:    Pt received in bed with spouse present.  Pt agreeable to therapy.   -Pt demonstrated ability to transition semi-reclined<>EOB with supervision. Noted improvement in quad activation and ability to control bilateral knee flexion (to pain tolerance) through transition.  -Slide board transfer x1 this session from bed>wc MinA. Pt positioned slide board with minor adjustment from therapist and improved sequencing requiring only two scoots for transfer. (Much improved from 5-6 scoots eval day).  -STS ModA progressing to MinA throughout session. Verbal and tactile cuing for set up and momentum to boost up.   Nustep activity performed x5 minutes for improved ROM, edema management, and LE activation. Seat distance lowered as time progressed to increase knee flexion as joints "warmed up." Slowed movements secondary to increased pain/stiffness in knees/ankles.  Seated heel slides 10xLLE, 1' RLE with increased range. Pt improved Right knee flexion to 80 degrees and L knee flexion 110 degrees.    Pt completed standing activity LUE on RW and RUE placing/removing clothes pins x12. Verbal cuing for improved posture.  Gait training 1x23f RW CGA-MinA. Pt demonstrated improvement in step length (almost to step through pattern). Forward flexed posture minimally corrected with verbal/tactile cuing.    Pt requires increased time for activity/mobility secondary to pain/limited  knee ROM.  Pt in bed HOB elevated, bed alarm on, call bell within reach. Spouse inquired about "what would it take to return home." Provided education on Physical Therapy LTG and ELOS.   Therapy Documentation Precautions:  Precautions Precautions: Fall Precaution Comments: colostomy, bandages over full abdomen, R knee ROM limitations Restrictions Weight Bearing Restrictions: No  Pain: Pain Assessment Pain Scale: 0-10 Pain Score: 5  Pain Type: Acute pain Pain Location: Knee Pain Orientation: Right;Left Pain Descriptors / Indicators: Aching Pain Onset: On-going Pain Intervention(s): Medication (See eMAR);Repositioned;Ambulation/increased activity  Therapy/Group: Individual Therapy  Herlinda Heady, SPT  09/04/2020, 4:43 PM

## 2020-09-04 NOTE — Progress Notes (Signed)
Occupational Therapy Session Note  Patient Details  Name: Roger Brennan MRN: [id number] Date of Birth: 05/22/1960  Today's Date: 09/04/2020 OT Individual Time: [id number] OT Individual Time Calculation (min): 8 min    Short Term Goals: Week 1:  OT Short Term Goal 1 (Week 1): Pt will use LRAD to don LB clothing with mod A OT Short Term Goal 2 (Week 1): Pt will complete sit > stand with mod A OT Short Term Goal 3 (Week 1): Pt will complete stand pivot transfer with mod A  Skilled Therapeutic Interventions/Progress Updates:  Pt greeted in bed with ice packs on B knees. Asked if pt would like to participate in therapy to make up for missed minutes. Pt politely declined all activity suggested due to LE pain. We discussed importance of using holistic pain mgt strategies in combination of prescribed pain medicine at CIR. Educated him on benefits of deep breathing and invited him to participate in a mindfulness meditation exercise. Pt declined at this time, wanting just to rest before his next session at 1pm. OT provided education on importance of therapeutic activity as a means of pain distraction to dampen pain perception, offered to print him off word puzzles or bring a free reading book. Pt showed OT games on his phone that he can use today to keep him occupied in order to not perseverate on pain. He declined use of aromatherapy. Pt verbalized appreciation of education, left him with all needs within reach and bed alarm set. Tx focus placed on pt education.     Therapy Documentation Precautions:  Precautions Precautions: Fall Precaution Comments: colostomy, bandages over full abdomen, R knee ROM limitations Restrictions Weight Bearing Restrictions: No  Pain: in both knees, OT removed ice packs prior to departure per pt request Pain Assessment Pain Scale: 0-10 Pain Score: 3  Pain Location: Knee Pain Orientation: Right;Left Pain Radiating Towards: feet Pain Descriptors / Indicators:  Aching Pain Frequency: Intermittent Pain Onset: On-going Patients Stated Pain Goal: 2 Pain Intervention(s): Medication (See eMAR) ADL: ADL Eating: Supervision/safety, Set up Where Assessed-Eating: Bed level Grooming: Supervision/safety, Setup Where Assessed-Grooming: Sitting at sink Upper Body Bathing: Supervision/safety, Setup Where Assessed-Upper Body Bathing: Edge of bed Lower Body Bathing: Supervision/safety, Setup Where Assessed-Lower Body Bathing: Edge of bed Upper Body Dressing: Supervision/safety, Setup Where Assessed-Upper Body Dressing: Edge of bed Lower Body Dressing: Maximal assistance Where Assessed-Lower Body Dressing: Edge of bed Toileting: Minimal assistance (urinal, colostomy) Where Assessed-Toileting: Bed level Toilet Transfer: Unable to assess Toilet Transfer Method: Stand pivot     Therapy/Group: Individual Therapy  Myking Sar A Shamyia Grandpre 09/04/2020, 12:21 PM

## 2020-09-04 NOTE — Progress Notes (Signed)
Occupational Therapy Session Note  Patient Details  Name: Roger Brennan MRN: [id number] Date of Birth: Mar 26, 1960  Today's Date: 09/04/2020 OT Individual Time: [id number] OT Individual Time Calculation (min): 57 min    Short Term Goals: Week 1:  OT Short Term Goal 1 (Week 1): Pt will use LRAD to don LB clothing with mod A OT Short Term Goal 2 (Week 1): Pt will complete sit > stand with mod A OT Short Term Goal 3 (Week 1): Pt will complete stand pivot transfer with mod A  Skilled Therapeutic Interventions/Progress Updates:    Treatment session with focus on self-care retraining, functional transfers, and BUE strengthening.  Pt received upright in bed agreeable to bathing and dressing this session.  Pt expressed desire to complete bathing from bed level this session.  Therapist educated pt on AE for LB bathing and dressing, with pt able to use long handled sponge to assist with LB bathing.  Pt will continue to benefit from practice with reacher when donning and doffing pants at bed level v seated EOB.  Pt engaged in rolling R and L requiring increased assist when rolling to L (to lift RLE).  Therapist assisted with threading pant legs and pt able to bridge to pull pants over hips.  Pt completed bed mobility with CGA and assist to stabilize RLE during mobility.  Therapist placed slide board and pt completed slide board transfer bed > w/c with min assist.  Pt completed grooming seated at sink with setup for items.  Therapist educated pt on BUE strengthening and stabilization exercises with orange theraband.  Pt completed 1 set of 15 chest presses and PNF diagonals.  Pt remained upright in w/c with seat belt alarm on and all needs in reach.  Therapy Documentation Precautions:  Precautions Precautions: Fall Precaution Comments: colostomy, bandages over full abdomen, R knee ROM limitations Restrictions Weight Bearing Restrictions: No  Pain:  Pt with c/o pain 6/10 in RLE.  RN  notified.    Therapy/Group: Individual Therapy  Simonne Come 09/04/2020, 8:49 AM

## 2020-09-05 ENCOUNTER — Inpatient Hospital Stay (HOSPITAL_COMMUNITY): Payer: Managed Care, Other (non HMO)

## 2020-09-05 DIAGNOSIS — E119 Type 2 diabetes mellitus without complications: Secondary | ICD-10-CM | POA: Diagnosis not present

## 2020-09-05 DIAGNOSIS — E440 Moderate protein-calorie malnutrition: Secondary | ICD-10-CM | POA: Diagnosis not present

## 2020-09-05 DIAGNOSIS — E871 Hypo-osmolality and hyponatremia: Secondary | ICD-10-CM | POA: Diagnosis not present

## 2020-09-05 DIAGNOSIS — M79604 Pain in right leg: Secondary | ICD-10-CM

## 2020-09-05 DIAGNOSIS — R5381 Other malaise: Secondary | ICD-10-CM | POA: Diagnosis not present

## 2020-09-05 DIAGNOSIS — M2550 Pain in unspecified joint: Secondary | ICD-10-CM

## 2020-09-05 LAB — GLUCOSE, CAPILLARY
Glucose-Capillary: 137 mg/dL — ABNORMAL HIGH (ref 70–99)
Glucose-Capillary: 232 mg/dL — ABNORMAL HIGH (ref 70–99)
Glucose-Capillary: 143 mg/dL — ABNORMAL HIGH (ref 70–99)
Glucose-Capillary: 182 mg/dL — ABNORMAL HIGH (ref 70–99)

## 2020-09-05 NOTE — Plan of Care (Signed)
  Problem: Consults Goal: RH GENERAL PATIENT EDUCATION Description: See Patient Education module for education specifics. Outcome: Progressing   Problem: RH BOWEL ELIMINATION Goal: RH STG MANAGE BOWEL WITH ASSISTANCE Description: STG Manage Bowel with  mod I Assistance. Outcome: Progressing   Problem: RH SKIN INTEGRITY Goal: RH STG MAINTAIN SKIN INTEGRITY WITH ASSISTANCE Description: STG Maintain Skin Integrity With min  Assistance. Outcome: Progressing Goal: RH STG ABLE TO PERFORM INCISION/WOUND CARE W/ASSISTANCE Description: STG Able To Perform Incision/Wound Care With min  Assistance. Outcome: Progressing   Problem: RH SAFETY Goal: RH STG ADHERE TO SAFETY PRECAUTIONS W/ASSISTANCE/DEVICE Description: STG Adhere to Safety Precautions With cues/reminders Assistance/Device. Outcome: Progressing   Problem: RH PAIN MANAGEMENT Goal: RH STG PAIN MANAGED AT OR BELOW PT'S PAIN GOAL Description: At or below level 4 Outcome: Progressing   Problem: RH KNOWLEDGE DEFICIT GENERAL Goal: RH STG INCREASE KNOWLEDGE OF SELF CARE AFTER HOSPITALIZATION Description:  Patient will be able to manage self care at discharge with min assist Outcome: Progressing   Problem: Education: Goal: Knowledge of ostomy care will improve Description: Patient and wife will be able to manage ostomy with min assist using handouts and educational resources Outcome: Progressing   Problem: Bowel/Gastric/Urinary: Goal: Gastrointestinal status for postoperative course will improve Outcome: Progressing   Problem: Coping: Goal: Coping ability will improve Outcome: Progressing   Problem: Health Behavior/Discharge Planning: Goal: Ability to manage health-related needs will improve Outcome: Progressing   Problem: Nutrition: Goal: Will attain and maintain optimal nutritional status will improve Description: Patient's nutritional status will improve with diet and supplementation as evidenced by wound healing, weight  gain/sustained, and labs WNL Outcome: Progressing   Problem: Skin Integrity: Goal: Risk for impaired skin integrity will decrease Outcome: Progressing

## 2020-09-05 NOTE — Progress Notes (Signed)
PROGRESS NOTE   Subjective/Complaints: Happy that swelling is better. Occ cough. Slept well last night  ROS: Patient denies fever, rash, sore throat, blurred vision, nausea, vomiting, diarrhea,   shortness of breath or chest pain, joint or back pain, headache, or mood change.    Objective:   No results found. No results for input(s): WBC, HGB, HCT, PLT in the last 72 hours.  Recent Labs    09/04/20 0438  NA 130*  K 4.3  CL 95*  CO2 27  GLUCOSE 140*  BUN 14  CREATININE 0.72  CALCIUM 8.8*    Intake/Output Summary (Last 24 hours) at 09/05/2020 0917 Last data filed at 09/05/2020 0845 Gross per 24 hour  Intake 952 ml  Output 1300 ml  Net -348 ml     Pressure Injury 09/01/20 Buttocks Left Deep Tissue Pressure Injury - Purple or maroon localized area of discolored intact skin or blood-filled blister due to damage of underlying soft tissue from pressure and/or shear. Purple colored area (Active)  09/01/20 1530  Location: Buttocks  Location Orientation: Left  Staging: Deep Tissue Pressure Injury - Purple or maroon localized area of discolored intact skin or blood-filled blister due to damage of underlying soft tissue from pressure and/or shear.  Wound Description (Comments): Purple colored area  Present on Admission: Yes    Physical Exam: Vital Signs Blood pressure 123/72, pulse 97, temperature 98.8 F (37.1 C), resp. rate 16, height $RemoveBe'5\' 10"'kznYZumJV$  (1.778 m), weight 74 kg, SpO2 97 %. Constitutional: No distress . Vital signs reviewed. HEENT: NCAT, EOMI, oral membranes moist Neck: supple Cardiovascular: RRR without murmur. No JVD    Respiratory/Chest: CTA Bilaterally without wheezes or rales. Normal effort    GI/Abdomen: BS +, -tender, non-distended, ostomy, wounds dressed Ext: no clubbing, cyanosis, 1+ edema Psych: pleasant and cooperative  Skin: intact Neuro:    Mental Status: He is alert and oriented to person, place, and  time. MSK: knee and ankle joints are swollen but less tender to palpation.     Assessment/Plan: 1. Functional deficits which require 3+ hours per day of interdisciplinary therapy in a comprehensive inpatient rehab setting. Physiatrist is providing close team supervision and 24 hour management of active medical problems listed below. Physiatrist and rehab team continue to assess barriers to discharge/monitor patient progress toward functional and medical goals  Care Tool:  Bathing    Body parts bathed by patient: Right arm, Left arm, Abdomen, Front perineal area, Chest, Right upper leg, Left upper leg, Face   Body parts bathed by helper: Right lower leg, Left lower leg, Buttocks     Bathing assist Assist Level: Moderate Assistance - Patient 50 - 74%     Upper Body Dressing/Undressing Upper body dressing   What is the patient wearing?: Pull over shirt    Upper body assist Assist Level: Contact Guard/Touching assist    Lower Body Dressing/Undressing Lower body dressing      What is the patient wearing?: Pants     Lower body assist Assist for lower body dressing: Maximal Assistance - Patient 25 - 49%     Toileting Toileting    Toileting assist Assist for toileting: Set up assist Assistive Device  Comment: Urinal   Transfers Chair/bed transfer  Transfers assist     Chair/bed transfer assist level: Minimal Assistance - Patient > 75% (slideboard)     Locomotion Ambulation   Ambulation assist      Assist level: Minimal Assistance - Patient > 75% Assistive device: Walker-rolling Max distance: 36ft   Walk 10 feet activity   Assist     Assist level: Minimal Assistance - Patient > 75% Assistive device: Walker-rolling   Walk 50 feet activity   Assist Walk 50 feet with 2 turns activity did not occur: Safety/medical concerns         Walk 150 feet activity   Assist Walk 150 feet activity did not occur: Safety/medical concerns         Walk 10  feet on uneven surface  activity   Assist Walk 10 feet on uneven surfaces activity did not occur: Safety/medical concerns         Wheelchair     Assist Will patient use wheelchair at discharge?: Yes Type of Wheelchair: Manual    Wheelchair assist level: Supervision/Verbal cueing Max wheelchair distance: 153ft    Wheelchair 50 feet with 2 turns activity    Assist        Assist Level: Supervision/Verbal cueing   Wheelchair 150 feet activity     Assist      Assist Level: Maximal Assistance - Patient 25 - 49%   Blood pressure 123/72, pulse 97, temperature 98.8 F (37.1 C), resp. rate 16, height 5\' 10"  (1.778 m), weight 74 kg, SpO2 97 %.  Medical Problem List and Plan: 1.  Debility             -patient may shower but wound must be covered             -ELOS/Goals: 2-3 weeks MinA          Continue CIR 2.  L-gastroc DVT: continue Eliquis. Dopplers neg             -antiplatelet therapy: N/A 3. Pain from bilateral lower extremity swelling:  Continue Oxycodone prn- currently requiring 15mg  due to pain from swelling --IV dilaudid prior to dressing changes BID --Robaxin 1000 mg tid Discussed with nursing: lease be very vigilant about keeping patient's legs elevated when he is in his room, and icing 15 minutes three times per day to his right knee and right ankle even if he does not ask Cr stable on Lasix- continue low dose supplementation. See #11 Added voltaren gel scheduled for right knee 4. Mood: LCSW to follow for evaluation and support.             -antipsychotic agents: N/AA 5. Neuropsych: This patient is capable of making decisions on his own behalf. 6. Abdominal incisions (3) and colostomy: Damp to dry dressing changes twice daily. --IV Dilaudid prior to dressing change. 7. Fluids/Electrolytes/Nutrition: Monitor intake/output.   --Continue Juven twice daily.   --Added vitamin C and zinc to help promote wound healing. Added Boost as per patient's  preference to mix with Juven 8. Wound infection: Has completed 9. Hyponatremia: Continue 1800 cc/FR- Stable around 130. Admission CMP stable- discussed with patient.  10. Acute blood loss anemia: Monitor with serial checks.             --CBC reviewed and stable 11.  Polyarthralgias with effusion: Continue Celebrex daily.  Appreciate ortho eval- will defer steroid injection since pain and swelling have improved with Lasix 10mg . Will increase to 20mg  Discussed with patient that  ESR and CRP are trending downward.  -Cr and K+ stable on Lasix $Remove'20mg'IdPMcLV$  daily -continue to monitor clinically 8/6 12. New diagnosis T2DM: Hgb A1C-7.4. Discontinue Ensure             --monitor BS ac/hs and use SSI for elevated BS.             --consulted RD for dietary education.  --provided with list of foods that are good for diabetes.   -8/6 cbg's a little higher yesterday--probably can still control this with consvt measures above    LOS: 4 days A FACE TO FACE EVALUATION WAS PERFORMED  Meredith Staggers 09/05/2020, 9:17 AM

## 2020-09-05 NOTE — Discharge Instructions (Addendum)
Inpatient Rehab Discharge Instructions  Roger Brennan Discharge date and time:    Activities/Precautions/ Functional Status: Activity: no lifting, driving, or strenuous exercise till cleared by MD Diet: regular diet Wound Care: Cleanse with normal saline, apply wet to dry dressing to midline abdominal wound. Change twice a dy   Functional status:  ___ No restrictions     ___ Walk up steps independently ___ 24/7 supervision/assistance   ___ Walk up steps with assistance _X__ Intermittent supervision/assistance  ___ Bathe/dress independently ___ Walk with walker     _X__ Bathe/dress with assistance ___ Walk Independently            ___ Shower independently ___ Walk with assistance            ___ Shower with assistance _X___ No alcohol              ___ Return to work/school ________   Special Instructions:  COMMUNITY REFERRALS UPON DISCHARGE:    Home Health:   PT & RN                  Agency:[facility]   Phone:[phone]  Medical Equipment/Items Ordered: [name]                                                 Agency/Supplier:[facility]  [phone]   My questions have been answered and I understand these instructions. I will adhere to these goals and the provided educational materials after my discharge from the hospital.  Patient/Caregiver Signature _______________________________ Date __________  Clinician Signature _______________________________________ Date __________  Please bring this form and your medication list with you to all your follow-up doctor's appointments.  Information on my medicine - ELIQUIS (apixaban)  This medication education was reviewed with me or my healthcare representative as part of my discharge preparation.  The pharmacist that spoke with me during my hospital stay was:    Why was Eliquis prescribed for you? 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  What do You need to know about Eliquis ? Continue Eliquis 5 mg tablet taken TWICE daily.  Eliquis may be taken with or without food.   Try to take the dose about the same time in the morning and in the evening. If you have difficulty swallowing the tablet whole please discuss with your pharmacist how to take the medication safely.  Take Eliquis exactly as prescribed and DO NOT stop taking Eliquis without talking to the doctor who prescribed the medication.  Stopping may increase your risk of developing a new blood clot.  Refill your prescription before you run out.  After discharge, you should have regular check-up appointments with your healthcare provider that is prescribing your Eliquis.    What do you do if you miss a dose? If a dose of ELIQUIS is not taken at the scheduled time, take it as soon as possible on the same day and twice-daily administration should be resumed. The dose should not be doubled to make up for a missed dose.  Important Safety Information A possible side effect of Eliquis is bleeding. You should call your healthcare provider right away if you experience any of the following:  Bleeding from an injury or your nose that does not stop. Unusual colored urine (red or dark brown) or unusual colored stools (red or black). Unusual bruising for unknown reasons. A serious fall or if you hit your head (even if there is no bleeding).  Some medicines may interact with Eliquis and might increase your risk of bleeding or clotting while on Eliquis. To help avoid this, consult your healthcare provider or pharmacist prior to using any new prescription or non-prescription medications, including herbals, vitamins, non-steroidal anti-inflammatory drugs (NSAIDs) and supplements.  This website has more information on Eliquis (apixaban):  http://[email or web address].com/eliquis/home  Colostomy Nutrition Therapy   This handout will provide some basic information about the effects of the colostomy surgery on digestion, absorption, and dietary changes for better results.  Colostomy surgery is when part of the large intestine (colon) is removed or bypassed. The remaining part of the functioning colon is brought through the abdominal wall, creating a stoma. It can be temporary or permanent depending on the surgery. After surgery, your bowel is swollen and you should avoid high-fiber foods. This is because they are harder to digest, and avoiding them will allow the bowel to heal and to avoid blockage of the colostomy. The eating plan begins with clear liquids and then should be advanced to a fiber-restricted diet before leaving the hospital. If you have lactose intolerance, then use lactose-free milk products. Chew your food slowly and well to help reduce the risk of blockage of the ostomy. As you recover, you will start eating solid foods, beginning with foods that are low in fiber (see Recommended Foods). The fiber-restricted diet should contain less than 13 grams of fiber for the whole day and may be less than 8 grams fiber per day depending on your symptoms. Most patients begin to eat more normally 6 weeks after surgery.   Tips Take small bites of foods and chew thoroughly for better digestion and absorption of nutrients. Some foods may cause blockages, especially if eaten in large amounts or not chewed well. Use caution when eating these foods, eat small amounts only, and chew them thoroughly, which will also help with better absorption of nutrients. You may find that your appetite is not as good as before surgery, so eating small amounts about every 2-4 hours is recommended. Keeping a regular schedule for meals and snacks can help reduce gas and result in better absorption of the nutrients from the foods. Eat meals and snacks at the same times  each day. Eating the largest meal in the middle of the day may decrease stool output at night, making it easier for you to get a full night's rest. Avoid acidic, spicy, fried or greasy foods, as well as foods that are high in sugar (candy, cake, pies, cookies, and sugary drinks). All of these foods can cause diarrhea. Foods that can thicken stools include bananas, applesauce, rice, and pasta. Some foods may cause odors or increase gas production (see Foods Not Recommended list). To reduce gas, avoid chewing gum, drinking with straws, carbonated beverages, smoking or chewing tobacco, eating too fast, and skipping meals. Missing meals can cause the small intestine to be more active and increase gas and watery stools. There is a "lag time"--the time from eating a gas-producing food and actual release of gas is 6-8 hours for distal colostomy patients. Limit foods that may cause gas or odor and choose foods that may decrease odor. Have at least 8 to 10 cups of fluids per day. You will need  to drink more during hot weather, when you have increased stools, and at other times when you lose extra fluid, such as when you exercise. Fluid equivalents are: 1 ounce is 30 milliliters; 1 cup is 8 ounces; 8 cups is 64 ounces, or 2 quarts, or 2 liters. It is important to watch for signs and symptoms of fluid-electrolyte imbalance, which include dry mouth, reduced urine output, dark concentrated urine, feelings of dizziness upon standing, marked fatigue, and abdominal cramping. If these occur, seek prompt treatment. Ileostomy patients are more at risk for dehydration. Remember, high-potassium foods are needed more to offset the effects of diarrhea. Add foods containing fiber gradually to your diet, but avoid those that can cause blockages initially. When you do add those foods, eat only very small portions and chew your food very well. Keep a food journal of foods tried and how you feel after eating them. Only try one new  food every 3 days. You will need to eat enough fiber and drink enough fluids to avoid constipation.  Use a chewable multivitamin with minerals (non-gummy) daily for best absorption. Use a chewable or liquid calcium supplement. Liquid has the best absorption.  Foods Recommended Some people are sensitive to some of the foods listed on the Recommended Foods chart. You may find that some of these foods cause you to have gas, unpleasant odors, or diarrhea. If this is the case, you should stop eating the foods that bother you. After 2 to 3 weeks, you can try small amounts to see whether they still cause symptoms.   Most of the recommended foods are low in fiber because fiber can be hard for your body to digest while you are recovering. When you first start eating solid foods, it is especially important that you stick to low-fiber choices (cooking vegetables and fruits or preparing canned items and removing skins and peels reduce the amount of fiber contained in these foods). As you heal, you can try foods with more fiber, such as whole grain foods.  Food Group Recommended Foods Notes  Milk and milk products Evaporated milk* Skim, low-fat milk, lactose free Skim, low-fat milk* Powdered milk* Buttermilk* Soy milk, rice milk or almond milk Yogurt* Kefir Cheese* Aged cheeses (cheddar cheese and Swiss cheese are lower in lactose) Low-fat ice cream* or sherbet* If you have diarrhea, try lactose-free milk and other lactose-free products. Lactose is a kind of sugar in milk that causes diarrhea in some people. Buttermilk, yogurt, and kefir can help keep your body from producing bad odors. Cheese can help thicken stools. It may be a good choice if you have diarrhea. Check the labels for calcium content of almond and rice milk for at least 30% calcium. These types of milk are not high in protein, so include other high-protein foods at meals and snacks to support healing. Foods marked with an asterisk (*)  contain lactose.  Meats and other protein foods Any meat or poultry prepared without added fat Smooth nut butters (limit serving size to 2 tablespoons or less) Fish Eggs Lactose free cottage cheese or other lactose free cheese Dried beans and peas can cause gas or bad odors. They should be avoided. For some people, fish, seafood, and eggs can cause bad odors. Try them in small amounts and see how your body reacts. Seafood, especially mollusks (oysters, clams and mussels), may cause blockage behind the stoma, so they must be chewed well. Avoid eating meats with casings, such as sausage or bratwurst. Peanut butter can help  thicken stools. Smooth peanut butter may be a good choice if you have diarrhea. Do not eat chunky spreads.  Grains Bread, bagels, rolls, crackers, pasta, and cereals made from white or refined flour White rice At first, you should only choose grain foods that are made with refined grains (white flour and white rice). Check labels for less than 2 grams fiber per serving. White rice and pasta can help thicken stools. They may be good choices if you have diarrhea. As you recover, you can try foods made with whole grains (like whole wheat, brown rice, or oats). Start with small amounts and see how you feel after eating them.  Vegetables Most well-cooked vegetables without seeds or skins Iceberg lettuce (< 1cup), begin with only a small amount shredded fine on sandwich and increase amount gradually week by week) Strained vegetable juice Potatoes without skins Some vegetables are more likely than others to cause gas, odors, or diarrhea. Many people develop gas or odors when they eat onions, garlic, or leeks. Vegetables in the cabbage family are also likely to cause problems. Avoid cabbages, broccoli, brussel sprouts, and cauliflower. Asparagus may cause bad odors. Potatoes can help thicken stools. They may be a good choice if you have diarrhea.  Fruits Most fruit juices (without  pulp) Peeled fruit Canned fruit Fresh fruit without edible seeds Juices may cause diarrhea; diluting with water can sometimes reduce diarrhea. Try only one type of juice for several days and watch symptoms. Avoid prune juice and grape juice, as they are more likely to cause diarrhea. Fruit peels should be avoided because they are high in fiber. Bananas and applesauce can help thicken stools. They may be good choices if you have diarrhea.  Fats and oils Butter, cream, cream cheese, margarine, mayonnaise, and oils When possible, choose healthy oils and fats, such as canola and olive oils. Limit to 8 teaspoons daily, as this may improve food tolerance and symptoms of discomfort.  Beverages             Any, except alcoholic beverages, prune juice, and grape juice         Carbonated drinks (such as soda) can cause gas. If you try them, start with a small amount. Alcoholic drinks (especially beer) can cause bad odors.  Others Avoid sugar substitutes, sugar alcohols such as xylitol and sorbitol. Cranberry juice can help keep your body from producing bad odors.   Foods Not Recommended When you first start eating solid foods, avoid foods that are high in fiber, such as whole grains, dried beans, and most raw vegetables and fruits. While you heal, you should also avoid any foods that cause you to experience odor, gas, diarrhea, or obstruction.   Foods That May Cause Blockage: Apples, unpeeled Dried fruit, raisins Relishes and olives  Bean sprouts Grapes Salad greens  Cabbage, raw Green peppers Seeds and nuts  Casing on sausage Mushrooms Spinach  Celery Nuts Tough, fibrous meats (for example, steak on grill)  Coconut Peas Vegetable and fruit skins  Coleslaw Pickles Whole grains  Corn Pineapple    Cucumbers Popcorn      Foods That May Cause Gas or Odor: Alcohol Cauliflower Grapes  Apples Cheeses, some types Green pepper  Asparagus Corn Melons  Bananas Cucumber Onions  Beer Dairy  products Peanuts  Broccoli Dried beans and peas Prunes  Brussels sprouts Eggs Radishes  Cabbage Fatty foods Turnips  Carbonated beverages Fish      Foods That May Discolor Stool: Beets Asparagus Spinach  Foods  with red dye Broccoli      Foods That May Help Relieve Gas and Odor: Buttermilk Yogurt with active cultures  Cranberry juice Parsley    Foods That May Cause Diarrhea (Looser or More Frequent Stool): Alcohol (including beer) Fruit: fresh, canned, or dried Prune juice or prunes  Apricots (and stone fruits) Fruit juice: apple, grape, orange Raw vegetables  Beans, baked or legumes Gum, sugar free Spicy foods  Bran High-fat foods Soup  Broccoli High-sugar foods Sugar-free substitutes  Brussels sprouts Licorice Sugar-free foods containing mannitol or sorbitol  Cabbage Milk and dairy foods Tomatoes  Caffeinated drinks (especially hot) Nuts or seeds Turnip greens/green leafy vegetables  Chocolate Peaches (stone fruit) Wine  Corn Peas Wheat/whole grains  Fried meats, fish, and poultry Plums (stone fruit)      Foods That May Help Thicken Stool: Applesauce Saltines Oatmeal (when acceptable to have fiber)  Bananas Tapioca Pasta (sauce may increase symptoms)  White rice, boiled Peanut butter, creamy White bread (not high in fiber)  Cheese Potatoes, no skin Barley (when acceptable to have fiber)  Marshmallows Pretzels Yogurt    Colostomy Sample 1-Day Menu  Breakfast Omelet made with 2 egg whites 2 tablespoons grated cheese, for omelet 8 ounces cranberry juice (no pulp) (0.4 g fiber)  Morning Snack 1 English muffin (1.5 g fiber) 1 teaspoon margarine, for English muffin  Lunch 3 ounces pork loin, tender 1/2 cup white rice (0.5 g fiber) 1 teaspoon margarine, for french bread 1 slice [place] bread (0.5 g fiber) 1 small banana (3 g fiber) 1 cup lactose-free milk  Afternoon Snack 1 cup yogurt  Evening Meal 1 ounce (small handful) pretzels (0.5 g fiber) 2 cups iced tea 2 ounces  [place] 1 ounce swiss cheese 2 slices white bread 1/2 cup applesauce (1.5 g fiber)  Evening Snack 2 whole graham crackers (1.5 g fiber) 1 tablespoon smooth peanut butter (1 g fiber) 1 cup soy milk or lactose-free milk   Colostomy Vegan Sample 1-Day Menu  Breakfast 1 cup puffed rice cereal, fortified 6 ounces soy yogurt 1 cup unsweetened cranberry juice  Morning Snack  cup unsweetened applesauce (1 gram fiber) 1 cup soymilk fortified with calcium, vitamin B12, and vitamin D  Lunch  cup tofu, stir-fried (grams fiber) 1 cup white rice (grams fiber)  cup low-fat coconut milk 1 tablespoon peanut butter, smooth (1 gram fiber) 1 cup unsweetened cranberry juice  Afternoon Snack 5 saltine crackers 1 tablespoon almond butter, smooth (grams fiber)  Evening Meal 2 slices white bread (2 grams fiber) 2 ounces meatless luncheon slices 1 teaspoon margarine, soft, tub 1 cup soymilk fortified with calcium, vitamin B12, and vitamin D  Evening Snack  small banana (1 gram fiber)   Colostomy Vegetarian (Lacto-Ovo) Sample 1-Day Menu  Breakfast 2 tablespoons peanut butter, smooth (2 grams fiber) 1 small banana (3 grams fiber)  Morning Snack  english muffin, toasted (1 gram fiber) 1 teaspoon margarine, soft, tub  cup unsweetened applesauce (1 gram fiber)  Lunch 1 scrambled egg  cup sweet potato, peeled (4 grams fiber) 1 tablespoon olive oil 1 cup unsweetened cranberry juice  Afternoon Snack 1 cup yogurt 1 cup cantaloupe (1 gram fiber)  Evening Meal 2 slices white bread (2 grams fiber) 2 ounces meatless luncheon slices 1 ounce Swiss cheese 1 cup 1% milk  Evening Snack 5 saltine crackers 1 ounce low-fat cheddar cheese  Copyright 2022  [employer]. All rights reserved

## 2020-09-05 NOTE — Progress Notes (Addendum)
Occupational Therapy Session Note  Patient Details  Name: Roger Brennan MRN: [id number] Date of Birth: 01/08/1961  Today's Date: 09/06/2020 OT Individual Time: 1219-7588 OT Individual Time Calculation (min): 60 min   Short Term Goals: Week 1:  OT Short Term Goal 1 (Week 1): Pt will use LRAD to don LB clothing with mod A OT Short Term Goal 2 (Week 1): Pt will complete sit > stand with mod A OT Short Term Goal 3 (Week 1): Pt will complete stand pivot transfer with mod A  Skilled Therapeutic Interventions/Progress Updates:    Pt greeted in bed, ADL needs met excluding oral care. He wanted to start session with sinkside activity. Supine<sit completed with setup assistance, HOB raised pt using the bedrail. AE training with sock aide education initiated. Pt donning both gripper socks using extra wide sock aide given Min A, cues, and demonstration. Slideboard<w/c completed with Min A and vcs. While seated at the sink, pt completed oral care/grooming tasks with setup. Provided pt with the opportunity to stand at this time however pt declined, stated he needed to "warm up" his legs first. Therefore pt was escorted to a community environment to work on LE stretches. Taught pt how to modify the gait belt to perform gentle stretches for hamstrings and ankles. Education emphasis was placed on terminal knee extension during straight leg raises and also ways to work on ankle/hip flexibility/ROM as well. Pt was then returned to the room via w/c. Left him with all needs within reach, set up to perform towel slide exercises with the Rt LE per pt request. Also set him up with ice packs for Rt knee + ankle for pain mgt. All needs within reach and safety belt fastened prior to departure.   Therapy Documentation Precautions:  Precautions Precautions: Fall Precaution Comments: colostomy, bandages over full abdomen, R knee ROM limitations Restrictions Weight Bearing Restrictions: No  ADL: ADL Eating:  Supervision/safety, Set up Where Assessed-Eating: Bed level Grooming: Supervision/safety, Setup Where Assessed-Grooming: Sitting at sink Upper Body Bathing: Supervision/safety, Setup Where Assessed-Upper Body Bathing: Edge of bed Lower Body Bathing: Supervision/safety, Setup Where Assessed-Lower Body Bathing: Edge of bed Upper Body Dressing: Supervision/safety, Setup Where Assessed-Upper Body Dressing: Edge of bed Lower Body Dressing: Maximal assistance Where Assessed-Lower Body Dressing: Edge of bed Toileting: Minimal assistance (urinal, colostomy) Where Assessed-Toileting: Bed level Toilet Transfer: Unable to assess Toilet Transfer Method: Stand pivot     Therapy/Group: Individual Therapy  Demonta Wombles A Barbie Croston 09/06/2020, 12:34 PM

## 2020-09-05 NOTE — Progress Notes (Signed)
RLE venous duplex has been completed.  Results can be found under chart review under CV PROC. 09/05/2020 4:45 PM Louetta Hollingshead RVT, RDMS

## 2020-09-06 DIAGNOSIS — E871 Hypo-osmolality and hyponatremia: Secondary | ICD-10-CM | POA: Diagnosis not present

## 2020-09-06 DIAGNOSIS — E440 Moderate protein-calorie malnutrition: Secondary | ICD-10-CM | POA: Diagnosis not present

## 2020-09-06 DIAGNOSIS — R5381 Other malaise: Secondary | ICD-10-CM | POA: Diagnosis not present

## 2020-09-06 DIAGNOSIS — E119 Type 2 diabetes mellitus without complications: Secondary | ICD-10-CM | POA: Diagnosis not present

## 2020-09-06 LAB — GLUCOSE, CAPILLARY
Glucose-Capillary: 120 mg/dL — ABNORMAL HIGH (ref 70–99)
Glucose-Capillary: 231 mg/dL — ABNORMAL HIGH (ref 70–99)
Glucose-Capillary: 142 mg/dL — ABNORMAL HIGH (ref 70–99)
Glucose-Capillary: 253 mg/dL — ABNORMAL HIGH (ref 70–99)

## 2020-09-06 NOTE — Plan of Care (Signed)
  Problem: Consults Goal: RH GENERAL PATIENT EDUCATION Description: See Patient Education module for education specifics. Outcome: Progressing   Problem: RH BOWEL ELIMINATION Goal: RH STG MANAGE BOWEL WITH ASSISTANCE Description: STG Manage Bowel with  mod I Assistance. Outcome: Progressing   Problem: RH SKIN INTEGRITY Goal: RH STG MAINTAIN SKIN INTEGRITY WITH ASSISTANCE Description: STG Maintain Skin Integrity With min  Assistance. Outcome: Progressing Goal: RH STG ABLE TO PERFORM INCISION/WOUND CARE W/ASSISTANCE Description: STG Able To Perform Incision/Wound Care With min  Assistance. Outcome: Progressing   Problem: RH SAFETY Goal: RH STG ADHERE TO SAFETY PRECAUTIONS W/ASSISTANCE/DEVICE Description: STG Adhere to Safety Precautions With cues/reminders Assistance/Device. Outcome: Progressing   Problem: RH PAIN MANAGEMENT Goal: RH STG PAIN MANAGED AT OR BELOW PT'S PAIN GOAL Description: At or below level 4 Outcome: Progressing   Problem: RH KNOWLEDGE DEFICIT GENERAL Goal: RH STG INCREASE KNOWLEDGE OF SELF CARE AFTER HOSPITALIZATION Description:  Patient will be able to manage self care at discharge with min assist Outcome: Progressing   Problem: Education: Goal: Knowledge of ostomy care will improve Description: Patient and wife will be able to manage ostomy with min assist using handouts and educational resources Outcome: Progressing   Problem: Bowel/Gastric/Urinary: Goal: Gastrointestinal status for postoperative course will improve Outcome: Progressing   Problem: Coping: Goal: Coping ability will improve Outcome: Progressing   Problem: Health Behavior/Discharge Planning: Goal: Ability to manage health-related needs will improve Outcome: Progressing   Problem: Nutrition: Goal: Will attain and maintain optimal nutritional status will improve Description: Patient's nutritional status will improve with diet and supplementation as evidenced by wound healing, weight  gain/sustained, and labs WNL Outcome: Progressing   Problem: Skin Integrity: Goal: Risk for impaired skin integrity will decrease Outcome: Progressing

## 2020-09-06 NOTE — Progress Notes (Signed)
PROGRESS NOTE   Subjective/Complaints: Pt feeling better. Happy that swelling has improved. Pain seems controlled  ROS: Patient denies fever, rash, sore throat, blurred vision, nausea, vomiting, diarrhea, cough, shortness of breath or chest pain,   headache, or mood change.     Objective:   VAS [place] LOWER EXTREMITY VENOUS (DVT)  Result Date: 09/05/2020  Lower Venous DVT Study Patient Name:  Roger Brennan  Date of Exam:   09/05/2020 Medical Rec #: [id number]        Accession #:    [id number] Date of Birth: 12-15-1960         Patient Gender: M Patient Age:   60 years Exam Location:  [facility] Procedure:      VAS [place] LOWER EXTREMITY VENOUS (DVT) Referring Phys: Leeroy Cha --------------------------------------------------------------------------------  Indications: Pain.  Anticoagulation: Eliquis. Comparison Study: Previous exam 08/20/2020 positive RLE gastroc Performing Technologist: Rogelia Rohrer RVT, RDMS  Examination Guidelines: A complete evaluation includes B-mode imaging, spectral Doppler, color Doppler, and power Doppler as needed of all accessible portions of each vessel. Bilateral testing is considered an integral part of a complete examination. Limited examinations for reoccurring indications may be performed as noted. The reflux portion of the exam is performed with the patient in reverse Trendelenburg.  +---------+---------------+---------+-----------+----------+--------------+ RIGHT    CompressibilityPhasicitySpontaneityPropertiesThrombus Aging +---------+---------------+---------+-----------+----------+--------------+ CFV      Full           Yes      Yes                                 +---------+---------------+---------+-----------+----------+--------------+ SFJ      Full                                                        +---------+---------------+---------+-----------+----------+--------------+ FV Prox   Full           Yes      Yes                                 +---------+---------------+---------+-----------+----------+--------------+ FV Mid   Full           Yes      Yes                                 +---------+---------------+---------+-----------+----------+--------------+ FV DistalFull           Yes      Yes                                 +---------+---------------+---------+-----------+----------+--------------+ PFV      Full                                                        +---------+---------------+---------+-----------+----------+--------------+  POP      Full           Yes      Yes                                 +---------+---------------+---------+-----------+----------+--------------+ PTV      Full                                                        +---------+---------------+---------+-----------+----------+--------------+ PERO     Full                                                        +---------+---------------+---------+-----------+----------+--------------+ Gastroc  Full                                                        +---------+---------------+---------+-----------+----------+--------------+   +----+---------------+---------+-----------+----------+--------------+ LEFTCompressibilityPhasicitySpontaneityPropertiesThrombus Aging +----+---------------+---------+-----------+----------+--------------+ CFV Full           Yes      Yes                                 +----+---------------+---------+-----------+----------+--------------+    Summary: RIGHT: - Findings suggest resolution of previously noted thrombus. - There is no evidence of deep vein thrombosis in the lower extremity. - There is no evidence of superficial venous thrombosis.  - No cystic structure found in the popliteal fossa. - Ultrasound characteristics of enlarged lymph nodes are noted in the groin.  LEFT: - No evidence of common femoral vein  obstruction. - Ultrasound characteristics of enlarged lymph nodes noted in the groin.  *See table(s) above for measurements and observations.    Preliminary    No results for input(s): WBC, HGB, HCT, PLT in the last 72 hours.  Recent Labs    09/04/20 0438  NA 130*  K 4.3  CL 95*  CO2 27  GLUCOSE 140*  BUN 14  CREATININE 0.72  CALCIUM 8.8*    Intake/Output Summary (Last 24 hours) at 09/06/2020 0815 Last data filed at 09/06/2020 0700 Gross per 24 hour  Intake 718 ml  Output 2400 ml  Net -1682 ml     Pressure Injury 09/01/20 Buttocks Left Deep Tissue Pressure Injury - Purple or maroon localized area of discolored intact skin or blood-filled blister due to damage of underlying soft tissue from pressure and/or shear. Purple colored area (Active)  09/01/20 1530  Location: Buttocks  Location Orientation: Left  Staging: Deep Tissue Pressure Injury - Purple or maroon localized area of discolored intact skin or blood-filled blister due to damage of underlying soft tissue from pressure and/or shear.  Wound Description (Comments): Purple colored area  Present on Admission: Yes    Physical Exam: Vital Signs Blood pressure 124/87, pulse 97, temperature (!) 97.4 F (36.3 C), temperature source Oral, resp. rate 17, height $RemoveBe'5\' 10"'sUHWGLUIT$  (1.778 m), weight 72.8 kg, SpO2 99 %. Constitutional:  No distress . Vital signs reviewed. HEENT: NCAT, EOMI, oral membranes moist Neck: supple Cardiovascular: RRR without murmur. No JVD    Respiratory/Chest: CTA Bilaterally without wheezes or rales. Normal effort    GI/Abdomen: BS +, non-tender, non-distended, ostomy site intact, wounds dressed Ext: no clubbing, cyanosis, tr LE edema Psych: pleasant and cooperative  Skin: intact Neuro:    Mental Status: He is alert and oriented to person, place, and time. MSK: knee and ankle joints are  less tender to palpation.     Assessment/Plan: 1. Functional deficits which require 3+ hours per day of interdisciplinary  therapy in a comprehensive inpatient rehab setting. Physiatrist is providing close team supervision and 24 hour management of active medical problems listed below. Physiatrist and rehab team continue to assess barriers to discharge/monitor patient progress toward functional and medical goals  Care Tool:  Bathing    Body parts bathed by patient: Right arm, Left arm, Abdomen, Front perineal area, Chest, Right upper leg, Left upper leg, Face   Body parts bathed by helper: Right lower leg, Left lower leg, Buttocks     Bathing assist Assist Level: Moderate Assistance - Patient 50 - 74%     Upper Body Dressing/Undressing Upper body dressing   What is the patient wearing?: Pull over shirt    Upper body assist Assist Level: Contact Guard/Touching assist    Lower Body Dressing/Undressing Lower body dressing      What is the patient wearing?: Pants     Lower body assist Assist for lower body dressing: Maximal Assistance - Patient 25 - 49%     Toileting Toileting    Toileting assist Assist for toileting: Set up assist Assistive Device Comment: Urinal   Transfers Chair/bed transfer  Transfers assist     Chair/bed transfer assist level: Minimal Assistance - Patient > 75% (slideboard)     Locomotion Ambulation   Ambulation assist      Assist level: Minimal Assistance - Patient > 75% Assistive device: Walker-rolling Max distance: 31ft   Walk 10 feet activity   Assist     Assist level: Minimal Assistance - Patient > 75% Assistive device: Walker-rolling   Walk 50 feet activity   Assist Walk 50 feet with 2 turns activity did not occur: Safety/medical concerns         Walk 150 feet activity   Assist Walk 150 feet activity did not occur: Safety/medical concerns         Walk 10 feet on uneven surface  activity   Assist Walk 10 feet on uneven surfaces activity did not occur: Safety/medical concerns         Wheelchair     Assist Will  patient use wheelchair at discharge?: Yes Type of Wheelchair: Manual    Wheelchair assist level: Supervision/Verbal cueing Max wheelchair distance: 117ft    Wheelchair 50 feet with 2 turns activity    Assist        Assist Level: Supervision/Verbal cueing   Wheelchair 150 feet activity     Assist      Assist Level: Maximal Assistance - Patient 25 - 49%   Blood pressure 124/87, pulse 97, temperature (!) 97.4 F (36.3 C), temperature source Oral, resp. rate 17, height $RemoveBe'5\' 10"'ykxxNGgdr$  (1.778 m), weight 72.8 kg, SpO2 99 %.  Medical Problem List and Plan: 1.  Debility             -patient may shower but wound must be covered             -  ELOS/Goals: 2-3 weeks MinA          Continue CIR 2.  L-gastroc DVT: continue Eliquis. No thrombus seen on dopplers 8/6             -antiplatelet therapy: N/A 3. Pain from bilateral lower extremity swelling:  Continue Oxycodone prn- currently requiring 14m due to pain from swelling --IV dilaudid prior to dressing changes BID --Robaxin 1000 mg tid Ice/elevation Cr stable on Lasix- continue low dose supplementation. See #11 Added voltaren gel scheduled for right knee 4. Mood: LCSW to follow for evaluation and support.             -antipsychotic agents: N/AA 5. Neuropsych: This patient is capable of making decisions on his own behalf. 6. Abdominal incisions (3) and colostomy: Damp to dry dressing changes twice daily. --IV Dilaudid prior to dressing change. 7. Fluids/Electrolytes/Nutrition: Monitor intake/output.   --Continue Juven twice daily.   --Added vitamin C and zinc to help promote wound healing. Added Boost as per patient's preference to mix with Juven 8. Wound infection: Has completed 9. Hyponatremia: Continue 1800 cc/FR- Stable around 130. Admission CMP stable- discussed with patient.  10. Acute blood loss anemia: Monitor with serial checks.             --CBC reviewed and stable 11.  Polyarthralgias with effusion: Continue Celebrex  daily.  Appreciate ortho eval- will defer steroid injection since pain and swelling have improved with Lasix 130m Will increase to 2023miscussed with patient that ESR and CRP are trending downward.  -Cr and K+ stable on Lasix 12m60mily -continue to monitor clinically 8/7 12. New diagnosis T2DM: Hgb A1C-7.4. Discontinue Ensure             --monitor BS ac/hs and use SSI for elevated BS.             --consulted RD for dietary education.  --provided with list of foods that are good for diabetes.   -8/6-7 cbg's are a little elevated. Try to control this with consvt measures above but may need an oral agent.     LOS: 5 days A FACE TO FACE EVALUATION WAS PERFORMED  ZachMeredith Staggers/2022, 8:15 AM

## 2020-09-06 NOTE — Progress Notes (Signed)
Physical Therapy Session Note  Patient Details  Name: Roger Brennan MRN: [id number] Date of Birth: May 18, 1960  Today's Date: 09/06/2020 PT Individual Time:  Session 1: 1100-1156    Session 2: [id number] PT Individual Time Calculation (min):  Session 1: 56 min        Session 2: 58 min  Short Term Goals: Week 1:  PT Short Term Goal 1 (Week 1): Pt will perform STS ModA PT Short Term Goal 2 (Week 1): Pt will ambulate 25 ft MinA PT Short Term Goal 3 (Week 1): Pt will initiate stair training PT Short Term Goal 4 (Week 1): Pt will improve RLE ROM to 75+ flexion  Skilled Therapeutic Interventions/Progress Updates:  Session 1: Pt received seated in WC and agreeable to PT. Session focused on progression towards decreased assistance with sit to stand, increased standing tolerance, and increased functional mobility of RLE. Pt started session with demonstration of sit to stand at bedside, requiring ModA + B rails to complete stand, but with noted difficulty maintaining standing position. Pt then transferred to therapy gym for time conservation. In therapy gym, pt completed prolonged standing in standing frame (10 mins). During standing, he completed 5 x 30 marches in place with B handhold support on frame. Pt then transferred to parallel bars for BLE therex where he completed front heel touches, back toe taps, hip abd, and high knee marches for 3 x 10 reps with seated rest breaks. Pt then returned to room to practice WC > bed transfer. Pt required MinA to set up [facility] along bedside and MinA for final placement of slide board. Pt required supervision for transfer along slide board into bed and final postioning in bed. At end of session, pt was left semi-reclined in bed with alarm engaged, call bell and all needs in reach.  Session 2: Pt received semi-reclined in bed and agreeable to PT. Session focused on R knee ROM, pre-gait activities and activity tolerance. Pt completed slide board transfer to [facility] from bed with  MinA for set up and supervision for transfer. In therapy gym, pt completed resisted seated marching for 8 mins with 2x 30 sec breaks. After seated marching, pt's R knee flex was measured to be 85 degrees. Pt then attempted sit to stand with RW from [facility], but stated that legs felt too fatigued from prior session. Pt was then transferred to parallel bars. In parallel bars, pt was able to pull to standing with MinA + B handhold on rails. From standing, the pt walked forward and backward for 2 x 52f. Pt then stated his legs felt exhausted for the day, but that he was still willing to participate in therapy. Pt was transferred to arm bike where he completed 11 mins of arm bike on level 2 to encourage activity tolerance. Pt then returned to room and practiced independent set up for WC>bed transfer, requiring minimal VC for WC and slide board placement and supervision for transfer. Pt and family were then educated on ankle and knee ROM exercises from bed. Pt was left semi-reclined in bed with family present, alarm engaged, call bell and all needs in reach.   Therapy Documentation Precautions:  Precautions Precautions: Fall Precaution Comments: colostomy, bandages over full abdomen, R knee ROM limitations Restrictions Weight Bearing Restrictions: No  Pain: Pain Assessment Pain Scale: 0-10 Pain Score: 7  Pain Type: Acute pain Pain Location: Generalized (joints) Pain Descriptors / Indicators: Aching Pain Onset: On-going Pain Intervention(s): Medication (See eMAR)    Therapy/Group: Individual Therapy  Marquette Saa, PT, DPT 09/06/2020, 12:48 PM

## 2020-09-07 DIAGNOSIS — R5381 Other malaise: Secondary | ICD-10-CM | POA: Diagnosis not present

## 2020-09-07 LAB — BASIC METABOLIC PANEL
Anion gap: 8 (ref 5–15)
BUN: 19 mg/dL (ref 6–20)
CO2: 26 mmol/L (ref 22–32)
Calcium: 9.2 mg/dL (ref 8.9–10.3)
Chloride: 98 mmol/L (ref 98–111)
Creatinine, Ser: 0.66 mg/dL (ref 0.61–1.24)
GFR, Estimated: >60 mL/min (ref >60)
Glucose, Bld: 138 mg/dL — ABNORMAL HIGH (ref 70–99)
Potassium: 4 mmol/L (ref 3.5–5.1)
Sodium: 132 mmol/L — ABNORMAL LOW (ref 135–145)

## 2020-09-07 LAB — GLUCOSE, CAPILLARY
Glucose-Capillary: 149 mg/dL — ABNORMAL HIGH (ref 70–99)
Glucose-Capillary: 125 mg/dL — ABNORMAL HIGH (ref 70–99)
Glucose-Capillary: 156 mg/dL — ABNORMAL HIGH (ref 70–99)
Glucose-Capillary: 129 mg/dL — ABNORMAL HIGH (ref 70–99)

## 2020-09-07 LAB — CBC
HCT: 27.9 % — ABNORMAL LOW (ref 39.0–52.0)
Hemoglobin: 9 g/dL — ABNORMAL LOW (ref 13.0–17.0)
MCH: 29.3 pg (ref 26.0–34.0)
MCHC: 32.3 g/dL (ref 30.0–36.0)
MCV: 90.9 fL (ref 80.0–100.0)
Platelets: 663 10*3/uL — ABNORMAL HIGH (ref 150–400)
RBC: 3.07 MIL/uL — ABNORMAL LOW (ref 4.22–5.81)
RDW: 15.1 % (ref 11.5–15.5)
WBC: 10.1 10*3/uL (ref 4.0–10.5)
nRBC: 0 % (ref 0.0–0.2)

## 2020-09-07 MED ORDER — POLYETHYLENE GLYCOL 3350 17 G PO PACK
17.0000 g | PACK | Freq: Every day | ORAL | Status: DC
  Administered 2020-09-07 – 2020-09-11 (×5): 17 g via ORAL
  Filled 2020-09-07 (×5): qty 1

## 2020-09-07 MED ORDER — ACETAMINOPHEN 325 MG PO TABS: 325.0000 mg | ORAL_TABLET | ORAL | Status: DC | PRN

## 2020-09-07 MED ORDER — ADULT MULTIVITAMIN W/MINERALS CH: 1.0000 | ORAL_TABLET | Freq: Every day | ORAL | Status: DC

## 2020-09-07 NOTE — Progress Notes (Signed)
Attempted ostomy education, pt refused to assist with ostomy care. Care coordinator notified. Attempted to teach wife with hands on dressing change. Both agreed to "just watch". Sheela Stack, LPN

## 2020-09-07 NOTE — Progress Notes (Signed)
PROGRESS NOTE   Subjective/Complaints: Swelling, pain, and mobility improved! Labs stable this morning except for elevated CBGs- discussed stopping Boost and he is agreeable Discussed results from VAS [place]  ROS: Patient denies fever, rash, sore throat, blurred vision, nausea, vomiting, diarrhea, cough, shortness of breath or chest pain, headache, or mood change.  Lower extremity swelling is improving   Objective:   VAS [place] LOWER EXTREMITY VENOUS (DVT)  Result Date: 09/06/2020  Lower Venous DVT Study Patient Name:  Roger Brennan  Date of Exam:   09/05/2020 Medical Rec #: [id number]        Accession #:    [id number] Date of Birth: 1960-04-17         Patient Gender: M Patient Age:   60 years Exam Location:  [facility] Procedure:      VAS [place] LOWER EXTREMITY VENOUS (DVT) Referring Phys: Leeroy Cha --------------------------------------------------------------------------------  Indications: Pain.  Anticoagulation: Eliquis. Comparison Study: Previous exam 08/20/2020 positive RLE gastroc Performing Technologist: Rogelia Rohrer RVT, RDMS  Examination Guidelines: A complete evaluation includes B-mode imaging, spectral Doppler, color Doppler, and power Doppler as needed of all accessible portions of each vessel. Bilateral testing is considered an integral part of a complete examination. Limited examinations for reoccurring indications may be performed as noted. The reflux portion of the exam is performed with the patient in reverse Trendelenburg.  +---------+---------------+---------+-----------+----------+--------------+ RIGHT    CompressibilityPhasicitySpontaneityPropertiesThrombus Aging +---------+---------------+---------+-----------+----------+--------------+ CFV      Full           Yes      Yes                                 +---------+---------------+---------+-----------+----------+--------------+ SFJ      Full                                                         +---------+---------------+---------+-----------+----------+--------------+ FV Prox  Full           Yes      Yes                                 +---------+---------------+---------+-----------+----------+--------------+ FV Mid   Full           Yes      Yes                                 +---------+---------------+---------+-----------+----------+--------------+ FV DistalFull           Yes      Yes                                 +---------+---------------+---------+-----------+----------+--------------+ PFV      Full                                                        +---------+---------------+---------+-----------+----------+--------------+  POP      Full           Yes      Yes                                 +---------+---------------+---------+-----------+----------+--------------+ PTV      Full                                                        +---------+---------------+---------+-----------+----------+--------------+ PERO     Full                                                        +---------+---------------+---------+-----------+----------+--------------+ Gastroc  Full                                                        +---------+---------------+---------+-----------+----------+--------------+   +----+---------------+---------+-----------+----------+--------------+ LEFTCompressibilityPhasicitySpontaneityPropertiesThrombus Aging +----+---------------+---------+-----------+----------+--------------+ CFV Full           Yes      Yes                                 +----+---------------+---------+-----------+----------+--------------+     Summary: RIGHT: - Findings suggest resolution of previously noted thrombus. - There is no evidence of deep vein thrombosis in the lower extremity. - There is no evidence of superficial venous thrombosis.  - No cystic structure found in the popliteal  fossa. - Ultrasound characteristics of enlarged lymph nodes are noted in the groin.  LEFT: - No evidence of common femoral vein obstruction. - Ultrasound characteristics of enlarged lymph nodes noted in the groin.  *See table(s) above for measurements and observations. Electronically signed by Jamelle Haring on 09/06/2020 at 2:04:19 PM.    Final    Recent Labs    09/07/20 0624  WBC 10.1  HGB 9.0*  HCT 27.9*  PLT 663*    Recent Labs    09/07/20 0624  NA 132*  K 4.0  CL 98  CO2 26  GLUCOSE 138*  BUN 19  CREATININE 0.66  CALCIUM 9.2    Intake/Output Summary (Last 24 hours) at 09/07/2020 0844 Last data filed at 09/07/2020 0819 Gross per 24 hour  Intake 610 ml  Output 2200 ml  Net -1590 ml     Pressure Injury 09/01/20 Buttocks Left Deep Tissue Pressure Injury - Purple or maroon localized area of discolored intact skin or blood-filled blister due to damage of underlying soft tissue from pressure and/or shear. Purple colored area (Active)  09/01/20 1530  Location: Buttocks  Location Orientation: Left  Staging: Deep Tissue Pressure Injury - Purple or maroon localized area of discolored intact skin or blood-filled blister due to damage of underlying soft tissue from pressure and/or shear.  Wound Description (Comments): Purple colored area  Present on Admission: Yes    Physical Exam: Vital Signs Blood pressure 132/73, pulse 97, temperature 98.3 F (36.8 C),  resp. rate 18, height $RemoveBe'5\' 10"'DYUxKMCBj$  (1.778 m), weight 71 kg, SpO2 97 %. Constitutional: No distress . Vital signs reviewed. HEENT: NCAT, EOMI, oral membranes moist Neck: supple Cardiovascular: RRR without murmur. No JVD    Respiratory/Chest: CTA Bilaterally without wheezes or rales. Normal effort    GI/Abdomen: BS +, non-tender, non-distended, ostomy site intact, wounds dressed Ext: no clubbing, cyanosis, tr LE edema Psych: pleasant and cooperative  Skin: intact Neuro:    Mental Status: He is alert and oriented to person, place, and  time. MSK: knee and ankle joints are  less tender to palpation.  Swelling has decreased   Assessment/Plan: 1. Functional deficits which require 3+ hours per day of interdisciplinary therapy in a comprehensive inpatient rehab setting. Physiatrist is providing close team supervision and 24 hour management of active medical problems listed below. Physiatrist and rehab team continue to assess barriers to discharge/monitor patient progress toward functional and medical goals  Care Tool:  Bathing    Body parts bathed by patient: Right arm, Left arm, Abdomen, Front perineal area, Chest, Right upper leg, Left upper leg, Face   Body parts bathed by helper: Right lower leg, Left lower leg, Buttocks     Bathing assist Assist Level: Moderate Assistance - Patient 50 - 74%     Upper Body Dressing/Undressing Upper body dressing   What is the patient wearing?: Pull over shirt    Upper body assist Assist Level: Contact Guard/Touching assist    Lower Body Dressing/Undressing Lower body dressing      What is the patient wearing?: Pants     Lower body assist Assist for lower body dressing: Maximal Assistance - Patient 25 - 49%     Toileting Toileting    Toileting assist Assist for toileting: Set up assist Assistive Device Comment: Urinal   Transfers Chair/bed transfer  Transfers assist     Chair/bed transfer assist level: Minimal Assistance - Patient > 75% (slideboard)     Locomotion Ambulation   Ambulation assist      Assist level: Minimal Assistance - Patient > 75% Assistive device: Walker-rolling Max distance: 12ft   Walk 10 feet activity   Assist     Assist level: Minimal Assistance - Patient > 75% Assistive device: Walker-rolling   Walk 50 feet activity   Assist Walk 50 feet with 2 turns activity did not occur: Safety/medical concerns         Walk 150 feet activity   Assist Walk 150 feet activity did not occur: Safety/medical concerns          Walk 10 feet on uneven surface  activity   Assist Walk 10 feet on uneven surfaces activity did not occur: Safety/medical concerns         Wheelchair     Assist Will patient use wheelchair at discharge?: Yes Type of Wheelchair: Manual    Wheelchair assist level: Supervision/Verbal cueing Max wheelchair distance: 172ft    Wheelchair 50 feet with 2 turns activity    Assist        Assist Level: Supervision/Verbal cueing   Wheelchair 150 feet activity     Assist      Assist Level: Maximal Assistance - Patient 25 - 49%   Blood pressure 132/73, pulse 97, temperature 98.3 F (36.8 C), resp. rate 18, height $RemoveBe'5\' 10"'WSKzYomnP$  (1.778 m), weight 71 kg, SpO2 97 %.  Medical Problem List and Plan: 1.  Debility             -patient may shower but  wound must be covered             -ELOS/Goals: 2-3 weeks MinA         Continue CIR 2.  L-gastroc DVT: continue Eliquis. No thrombus seen on dopplers 8/6. There are swollen groin LN bilaterally- discussed that this could be a response to infection.              -antiplatelet therapy: N/A 3. Pain from bilateral lower extremity swelling:  Continue Oxycodone prn- currently requiring 15mg  due to pain from swelling --IV dilaudid prior to dressing changes BID --Robaxin 1000 mg tid Ice/elevation Cr stable on Lasix- continue low dose supplementation. See #11 Added voltaren gel scheduled for right knee Provided list of foods that can help with pain.  4. Mood: LCSW to follow for evaluation and support.             -antipsychotic agents: N/AA 5. Neuropsych: This patient is capable of making decisions on his own behalf. 6. Abdominal incisions (3) and colostomy: Damp to dry dressing changes twice daily. --IV Dilaudid prior to dressing change. 7. Fluids/Electrolytes/Nutrition: Monitor intake/output.   --Continue Juven twice daily.   --Added vitamin C and zinc to help promote wound healing.  8. Wound infection: Has completed 9. Hyponatremia:  Continue 1800 cc/FR- Stable around 130. Admission CMP stable- discussed with patient.  10. Acute blood loss anemia: Monitor with serial checks.             --CBC reviewed and stable 11.  Polyarthralgias with effusion: Continue Celebrex daily.  Appreciate ortho eval- will defer steroid injection since pain and swelling have improved with Lasix 10mg . Will increase to 20mg  Discussed with patient that ESR and CRP are trending downward.  -Cr and K+ stable on Lasix 20mg  daily -continue to monitor clinically 8/7 12. New diagnosis T2DM: Hgb A1C-7.4.              --monitor BS ac/hs and use SSI for elevated BS.             --consulted RD for dietary education.  --provided with list of foods that are good for diabetes.   -d/c Boost since and use Ensure Max instead.     LOS: 6 days A FACE TO FACE EVALUATION WAS PERFORMED  Clide Deutscher Onaje Warne 09/07/2020, 8:44 AM

## 2020-09-07 NOTE — Progress Notes (Signed)
Patient requesting 2 piece colostomy pouching system for ease of maintenance and ease of use

## 2020-09-07 NOTE — Progress Notes (Deleted)
Patient requesting 2 piece colostomy puching system for ease of use

## 2020-09-07 NOTE — Progress Notes (Signed)
Occupational Therapy Session Note  Patient Details  Name: Roger Brennan MRN: [id number] Date of Birth: Apr 13, 1960  Today's Date: 09/07/2020 OT Individual Time: 3276-1470 OT Individual Time Calculation (min): 60 min    Short Term Goals: Week 1:  OT Short Term Goal 1 (Week 1): Pt will use LRAD to don LB clothing with mod A OT Short Term Goal 2 (Week 1): Pt will complete sit > stand with mod A OT Short Term Goal 3 (Week 1): Pt will complete stand pivot transfer with mod A  Skilled Therapeutic Interventions/Progress Updates:    Pt received in bed and agreeable to therapy.  Pt able to sit to EOB with S and then did AROM towel slides on floor with R foot in various directions with very small ROM to "warm up" his painful R knee.  Using a board with set up - pt scooted on the board to his wc with S only.   Sat at sink to do oral care and UB self care from with no A.  Used urinal independently from wc.   To work on progressive tolerance for R leg WB to progress for standing, had pt sit in wc and flex knees to his tolerance then do 3 pushups with arms and hold each push up for a few counts.  He then brought knees into slightly more flexion and did 3 more pushups.  Kept this pattern of progressive knee flexion for 3 more sets.    At end of session, pt wanted his feet elevated higher than he can get in the wc with elevating leg rests. Placed his feet on pillows on arm chair with board under R leg for support. Pt felt this was fairly comfortable.    Belt alarm on and all needs met. j  Therapy Documentation Precautions:  Precautions Precautions: Fall Precaution Comments: colostomy, bandages over full abdomen, R knee ROM limitations Restrictions Weight Bearing Restrictions: No Pain: Pain Assessment Pain Scale: 0-10 Pain Score: 6  Pain Orientation: Left;Right;Upper;Lower Pain Radiating Towards: joints Pain Descriptors / Indicators: Aching Pain Intervention(s): Medication (See  eMAR)   Therapy/Group: Individual Therapy  [place] 09/07/2020, 9:07 AM

## 2020-09-07 NOTE — Consult Note (Signed)
[place] Nurse ostomy follow up Patient receiving care in [facility] CIR 4M02.  Primary LPN, Annita Brod reached out via Secure Chat and requested ordering numbers for a 2 piece pouching system.  I have updated the care instruction order to reflect Pouch Kellie Simmering #649, skin barrier Kellie Simmering #2, and barrier rings, Kellie Simmering [phone].  I also  explained that the patient's spouse was nearly completely independent in providing ostomy care the last time I saw him. Also, the last time I saw him he explained that his elbow pain was preventing him from doing ostomy care.  The [place] team has signed off but can be reconsulted if needed. [place] nurse will not follow at this time.  Please re-consult the [place] team if needed.  Val Riles, RN, MSN, CWOCN, CNS-BC, pager [phone]

## 2020-09-07 NOTE — Progress Notes (Signed)
Physical Therapy Session Note  Patient Details  Name: Faruk Mcneil MRN: [id number] Date of Birth: [birth year]  Today's Date: 09/07/2020 PT Group Time: 1100-1158 PT Group Time Calculation (min): 58 min  Short Term Goals: Week 1:  PT Short Term Goal 1 (Week 1): Pt will perform STS ModA PT Short Term Goal 2 (Week 1): Pt will ambulate 25 ft MinA PT Short Term Goal 3 (Week 1): Pt will initiate stair training PT Short Term Goal 4 (Week 1): Pt will improve RLE ROM to 75+ flexion  Skilled Therapeutic Interventions/Progress Updates:      Pt engaged in therapeutic w/c level activities in group setting, focused on UE/LE strengthening, activity tolerance, social engagement. Pt was guided through various exercises with music involving upper extremities, lower extremities, and trunk/core activation. AROM to assist with STG #4 for increasing RLE ROM to 75deg flex. ROM to tolerance. All music was selected by group members. Adaptations made when appropriate to accommodate for deficits. Pt with appropriate sustained participation throughout session. Rehab tech assisted pt with returning to room at end of session.   Therapy Documentation Precautions:  Precautions Precautions: Fall Precaution Comments: colostomy, bandages over full abdomen, R knee ROM limitations Restrictions Weight Bearing Restrictions: No General:    Therapy/Group: Group Therapy  Jamespaul Secrist P Mumtaz Lovins 09/07/2020, 7:50 AM

## 2020-09-07 NOTE — Progress Notes (Signed)
Occupational Therapy Session Note  Patient Details  Name: Roger Brennan MRN: [id number] Date of Birth: [birth year]  Today's Date: 09/07/2020 OT Individual Time: [id number] OT Individual Time Calculation (min): 40 min    Short Term Goals: Week 1:  OT Short Term Goal 1 (Week 1): Pt will use LRAD to don LB clothing with mod A OT Short Term Goal 2 (Week 1): Pt will complete sit > stand with mod A OT Short Term Goal 3 (Week 1): Pt will complete stand pivot transfer with mod A  Skilled Therapeutic Interventions/Progress Updates:    Treatment session with focus on sit > stand and functional mobility.  Pt received semi-reclined in bed agreeable to therapy session.  Pt completed bed mobility with supervision to come to sitting EOB.  Pt completed towel slides on floor with R foot in various directions as "warm up" prior to mobility.  Pt completed sit > stand from elevated EOB with RW with min assist.  Pt completed marching in place prior to ambulation for further "warm up" while challenging stability. Pt ambulated 47' with RW with CGA.  Pt returned to EOB for rest break.  Completed sit > stand x2 from lower surface (still elevated) with min assist.  Ambulated 61' with RW with CGA with increased effort due to tightness in R knee, pain, and fatigue.  Pt returned to EOB and completed sidestepping to improve positioning prior to returning to bed while also working abductors/adductors.  Pt educated on bed level and EOB exercises for knee mobility (as previously educated in various sessions).  Pt able to advance RLE into bed without assistance for UE or therapist.  Pt remained semi-reclined with legs on pillows and ice applied to medial aspect of R knee and all needs in reach.  Therapy Documentation Precautions:  Precautions Precautions: Fall Precaution Comments: colostomy, bandages over full abdomen, R knee ROM limitations Restrictions Weight Bearing Restrictions: No General:   Vital Signs: Therapy  Vitals Pulse Rate: 83 Resp: 18 BP: 118/75 Patient Position (if appropriate): Lying Oxygen Therapy SpO2: 100 % O2 Device: Room Air Pain:  Pt with c/o pain in RLE, not rated.  Not due for pain meds at time of session.   Therapy/Group: Individual Therapy  Simonne Come 09/07/2020, 4:33 PM

## 2020-09-08 LAB — GLUCOSE, CAPILLARY
Glucose-Capillary: 123 mg/dL — ABNORMAL HIGH (ref 70–99)
Glucose-Capillary: 137 mg/dL — ABNORMAL HIGH (ref 70–99)
Glucose-Capillary: 151 mg/dL — ABNORMAL HIGH (ref 70–99)
Glucose-Capillary: 151 mg/dL — ABNORMAL HIGH (ref 70–99)

## 2020-09-08 NOTE — Progress Notes (Signed)
Occupational Therapy Session Note  Patient Details  Name: Roger Brennan MRN: [id number] Date of Birth: [birth year]  Today's Date: 09/08/2020 OT Individual Time: [id number] OT Individual Time Calculation (min): 57 min    Short Term Goals: Week 1:  OT Short Term Goal 1 (Week 1): Pt will use LRAD to don LB clothing with mod A OT Short Term Goal 2 (Week 1): Pt will complete sit > stand with mod A OT Short Term Goal 3 (Week 1): Pt will complete stand pivot transfer with mod A  Skilled Therapeutic Interventions/Progress Updates:    Treatment session with focus on self-care retraining, functional transfers, sit > stand, and endurance. Pt received supine in bed reporting stiffness in R knee but willing to attempt therapy this session.  Pt completed bed mobility supervision and completed slide board transfer to w/c with setup assist from therapist. Pt engaged in oral care and UB bathing/dressing seated at sink.  Engaged in towel glides on floor to "warm up" RLE in preparation for attempts at sit > stand and stand pivot transfers, however pt stating R knee still too stiff. Pt agreeable to attempting activity on Nustep for endurance and knee flexion.  Pt completed squat pivot transfer w/c <> Nustep with CGA. Pt completed 12 mins on Nustep level 1 with focus on knee flexion while discussing therapy goals, pt personal goals, and current level.  Pt reports that his wife works during the day, that he would need to be able to toilet by himself even if she provided setup for bathing/dressing.  Pt returned to room and completed squat pivot transfer CGA w/c > bed.  Pt completed sit > stand x2 from elevated surface with min assist and RW.  Pt able to advance RLE in to bed with supervision.  Pt left semi-reclined with legs elevated and all needs in reach.  Therapy Documentation Precautions:  Precautions Precautions: Fall Precaution Comments: colostomy, bandages over full abdomen, R knee ROM  limitations Restrictions Weight Bearing Restrictions: No  Pain: Pain Assessment Pain Scale: 0-10 Pain Score: 6    Therapy/Group: Individual Therapy  Simonne Come 09/08/2020, 9:29 AM

## 2020-09-08 NOTE — Patient Care Conference (Signed)
Inpatient RehabilitationTeam Conference and Plan of Care Update Date: 09/08/2020   Time: 13:45 PM    Patient Name: Roger Brennan      Medical Record Number: [id number]  Date of Birth: 1960-08-12 Sex: Male         Room/Bed: 4M02C/4M02C-01 Payor Info: Payor: [employer] / Plan: CIGNA MANAGED / Product Type: *No Product type* /    Admit Date/Time:  09/01/2020  2:56 PM  Primary Diagnosis:  [facility] Problems: Principal Problem:   Debility Active Problems:   Malnutrition of moderate degree    Expected Discharge Date: Expected Discharge Date: 09/22/20  Team Members Present: Physician leading conference: Dr. Leeroy Cha Social Worker Present: Erlene Quan, BSW Nurse Present: Dorien Chihuahua, RN PT Present: Becky Sax, PT OT Present: Simonne Come, OT PPS Coordinator present : Gunnar Fusi, SLP     Current Status/Progress Goal Weekly Team Focus  Bowel/Bladder   Patient is continent of bowel and bladder  Patient will remain continent of bowel and bladder  Will assess qshift and PRN   Swallow/Nutrition/ Hydration             ADL's   setup UB, min-mod LB and toileting, supervison with slide board, Min A sit > stand and ambulatory transfers with RW  supervision overall  ADL retraining, functional mobility, AE for LB, pt education   Mobility   bed mobility min/mod A, transfers with RW max A but min A with slideboard, gait 67f with RW min A  mod I/supervision  functional mobility/transfers, generalized strengthening, dynamic standing balance/coordination, gait training, pain management, and endurance.   Communication             Safety/Cognition/ Behavioral Observations            Pain   Patient's pain is controlled with medication  Patient's pain will be managed without medication  Will assess qshift and PRN   Skin   Patient's wounds are healing  Patient's wounds will continue to heal  Will assess qshift and PRN     Discharge Planning:  Discharging home with  spouse Able to provide supervision 24/7   Team Discussion: Urinary frequency. Ongoing IV dilaudid for dressing changes wean to po medications. CBG management better now off ensure/glucerna. Patient with constipation = laxatives and enc fluids. Patient is hesitant to complete self care or ostomy management and is not interested in adaptive equipment ; reports wife will assist. Requires mod - max assist for lower body bathing and dressing.  Progress limited by mobility and pain; and functional status fluctuates as well with little ROM in right knee. Gout workup negative; MD to adjust medications.  Patient on target to meet rehab goals: Currently requires CGA for squat pivot transfers and min assist for slide-board transfers. Min assist overall and able to ambulate up to 30' with a rolling walker with min assist. Goals for discharge set at Mod I  - supervision level.  *See Care Plan and progress notes for long and short-term goals.   Revisions to Treatment Plan:    Teaching Needs: Wound care, ostomy care, medications, dietary modifications, safety, etc  Current Barriers to Discharge: Decreased caregiver support, Home enviroment access/layout, Wound care, and Behavior and limited resources for assistance at discharge  Possible Resolutions to Barriers: Family education     Medical Summary Current Status: constipation, post-operative pain, CBGs much better controlled, right knee pain, abdominal incision  Barriers to Discharge: Medical stability;New diabetic;Wound care  Barriers to Discharge Comments: constipation, post-operative pain, CBGs much  better controlled, right knee pain, abdominal incision Possible Resolutions to [employer] Focus: add miralax, continue to wean opioid medication, continue to monitor CBGs and provide dietary education, continue celebrex, continue to monitor abdominal incisions   Continued Need for Acute Rehabilitation Level of Care: The patient requires daily  medical management by a physician with specialized training in physical medicine and rehabilitation for the following reasons: Direction of a multidisciplinary physical rehabilitation program to maximize functional independence : Yes Medical management of patient stability for increased activity during participation in an intensive rehabilitation regime.: Yes Analysis of laboratory values and/or radiology reports with any subsequent need for medication adjustment and/or medical intervention. : Yes   I attest that I was present, lead the team conference, and concur with the assessment and plan of the team.   Dorien Chihuahua B 09/08/2020, 2:10 PM

## 2020-09-08 NOTE — Progress Notes (Signed)
Patient ID: Roger Brennan, male   DOB: 05-19-1960, 60 y.o.   MRN: [id number] Team Conference Report to Patient/Family  Team Conference discussion was reviewed with the patient and caregiver, including goals, any changes in plan of care and target discharge date.  Patient and caregiver express understanding and are in agreement.  The patient has a target discharge date of 09/22/20.  SW met with patient and spouse at bedside.Sw provided conference updates. Patient progressing. SW spoke to family about possibility of having to complete wound care at d/c due to insurance. Patient and spouse both report being uncomfortable doing so. Spouse would like to know how long wound would need care. SW informed pt and spouse nursing staff would be coming around periodically to provide education. Family would like to meet with CM and requesting call from Dr. Ranell Patrick. SW will continue to follow up.  Dyanne Iha 09/08/2020, 2:41 PM

## 2020-09-08 NOTE — Progress Notes (Signed)
Physical Therapy Session Note  Patient Details  Name: Roger Brennan MRN: [id number] Date of Birth: 1960-12-27  Today's Date: 09/08/2020 PT Individual Time: 1000-1054 PT Individual Time Calculation (min): 54 min   Short Term Goals: Week 1:  PT Short Term Goal 1 (Week 1): Pt will perform STS ModA PT Short Term Goal 2 (Week 1): Pt will ambulate 25 ft MinA PT Short Term Goal 3 (Week 1): Pt will initiate stair training PT Short Term Goal 4 (Week 1): Pt will improve RLE ROM to 75+ flexion  Skilled Therapeutic Interventions/Progress Updates:   Received pt semi-reclined in bed, pt agreeable to PT treatment, and reported pain 7/10 in R knee (premedicated). Repositioning, rest breaks, and distraction done to reduce pain levels. Pt reported having a good day yesterday but feeling very sore today with difficulty bending R knee. Session with emphasis on functional mobility/transfers, generalized strengthening and ROM, gait training, and improved endurance. While lying in bed performed active assisted heel slides on RLE 2x10 and active heel slides on LLE 2x10. Pt able to flex R knee to ~35 degrees initially increasing to ~45 degrees after heel slides but able to flex L knee to ~80 degrees. Pt transferred semi-reclined<>sitting EOB with supervision with HOB elevated. Sit<>stand with RW and mod A and ambulated 61f with RW and min A. Pt demonstrates R knee flexed in stance with downward gaze and increased reliance on UE support. Pt transported to dayroom and worked on seated LE strengthening on Kinetron at 20 cm/sec for 1 minute x 4 trials with emphasis on R knee flexion and glute/quad strengthening. Pt able to achieve ~75 degrees R knee flexion after activity. [facility] and requested to return to bed. Sit<>stand with RW and mod A and ambulated 562fwith RW and min A and [facility]. Sit<>supine with supervision. Concluded session with pt semi-reclined in bed, needs  within reach, and bed alarm on. Provided pt with ice pack for R knee.   Therapy Documentation Precautions:  Precautions Precautions: Fall Precaution Comments: colostomy, bandages over full abdomen, R knee ROM limitations Restrictions Weight Bearing Restrictions: No  Therapy/Group: Individual Therapy [name], DPT   09/08/2020, 7:37 AM

## 2020-09-08 NOTE — Progress Notes (Addendum)
PROGRESS NOTE   Subjective/Complaints: CBGs better controlled after stopping Boost, providing dietary education Progressing well with therapy! Swelling continues to limit therapy  ROS: Patient denies fever, rash, sore throat, blurred vision, nausea, vomiting, diarrhea, cough, shortness of breath or chest pain, headache, or mood change.  Lower extremity swelling is improving, +right knee and ankle pain, +urinary frequency   Objective:   No results found. Recent Labs    09/07/20 0624  WBC 10.1  HGB 9.0*  HCT 27.9*  PLT 663*    Recent Labs    09/07/20 0624  NA 132*  K 4.0  CL 98  CO2 26  GLUCOSE 138*  BUN 19  CREATININE 0.66  CALCIUM 9.2    Intake/Output Summary (Last 24 hours) at 09/08/2020 1006 Last data filed at 09/08/2020 9563 Gross per 24 hour  Intake 850 ml  Output 1250 ml  Net -400 ml     Pressure Injury 09/01/20 Buttocks Left Deep Tissue Pressure Injury - Purple or maroon localized area of discolored intact skin or blood-filled blister due to damage of underlying soft tissue from pressure and/or shear. Purple colored area (Active)  09/01/20 1530  Location: Buttocks  Location Orientation: Left  Staging: Deep Tissue Pressure Injury - Purple or maroon localized area of discolored intact skin or blood-filled blister due to damage of underlying soft tissue from pressure and/or shear.  Wound Description (Comments): Purple colored area  Present on Admission: Yes    Physical Exam: Vital Signs Blood pressure 133/88, pulse (!) 103, temperature 97.9 F (36.6 C), temperature source Oral, resp. rate 16, height $RemoveBe'5\' 10"'olrDKOcXD$  (1.778 m), weight 70.5 kg, SpO2 100 %. Constitutional: No distress . Vital signs reviewed. HEENT: NCAT, EOMI, oral membranes moist Neck: supple Cardiovascular: Tachcyardia   Respiratory/Chest: CTA Bilaterally without wheezes or rales. Normal effort    GI/Abdomen: BS +, non-tender, non-distended,  ostomy site intact, wounds healing well     Ext: no clubbing, cyanosis, tr LE edema Psych: pleasant and cooperative  Skin: intact Neuro:    Mental Status: He is alert and oriented to person, place, and time. MSK: knee and ankle joints are  less tender to palpation.  Swelling has decreased   Assessment/Plan: 1. Functional deficits which require 3+ hours per day of interdisciplinary therapy in a comprehensive inpatient rehab setting. Physiatrist is providing close team supervision and 24 hour management of active medical problems listed below. Physiatrist and rehab team continue to assess barriers to discharge/monitor patient progress toward functional and medical goals  Care Tool:  Bathing    Body parts bathed by patient: Right arm, Left arm, Chest, Abdomen (UB only this am)   Body parts bathed by helper: Right lower leg, Left lower leg, Buttocks     Bathing assist Assist Level: Set up assist     Upper Body Dressing/Undressing Upper body dressing   What is the patient wearing?: Pull over shirt    Upper body assist Assist Level: Set up assist    Lower Body Dressing/Undressing Lower body dressing      What is the patient wearing?: Pants     Lower body assist Assist for lower body dressing: Maximal Assistance - Patient 25 -  49%     Toileting Toileting    Toileting assist Assist for toileting: Set up assist Assistive Device Comment: Urinal   Transfers Chair/bed transfer  Transfers assist     Chair/bed transfer assist level: Supervision/Verbal cueing     Locomotion Ambulation   Ambulation assist      Assist level: Minimal Assistance - Patient > 75% Assistive device: Walker-rolling Max distance: 68ft   Walk 10 feet activity   Assist     Assist level: Minimal Assistance - Patient > 75% Assistive device: Walker-rolling   Walk 50 feet activity   Assist Walk 50 feet with 2 turns activity did not occur: Safety/medical concerns          Walk 150 feet activity   Assist Walk 150 feet activity did not occur: Safety/medical concerns         Walk 10 feet on uneven surface  activity   Assist Walk 10 feet on uneven surfaces activity did not occur: Safety/medical concerns         Wheelchair     Assist Will patient use wheelchair at discharge?: Yes Type of Wheelchair: Manual    Wheelchair assist level: Supervision/Verbal cueing Max wheelchair distance: 175ft    Wheelchair 50 feet with 2 turns activity    Assist        Assist Level: Supervision/Verbal cueing   Wheelchair 150 feet activity     Assist      Assist Level: Maximal Assistance - Patient 25 - 49%   Blood pressure 133/88, pulse (!) 103, temperature 97.9 F (36.6 C), temperature source Oral, resp. rate 16, height 5\' 10"  (1.778 m), weight 70.5 kg, SpO2 100 %.  Medical Problem List and Plan: 1.  Debility             -patient may shower but wound must be covered             -ELOS/Goals: 2-3 weeks MinA         Continue CIR  -Interdisciplinary Team Conference today   2.  L-gastroc DVT: continue Eliquis. No thrombus seen on dopplers 8/6. There are swollen groin LN bilaterally- discussed that this could be a response to infection.              -antiplatelet therapy: N/A 3. Pain from bilateral lower extremity swelling:  Continue Oxycodone prn- currently requiring 15mg  due to pain from swelling --IV dilaudid prior to dressing changes BID --Robaxin 1000 mg tid Ice/elevation Cr stable on Lasix- continue low dose supplementation. See #11 Added voltaren gel scheduled for right knee Provided list of foods that can help with pain.  4. Mood: LCSW to follow for evaluation and support.             -antipsychotic agents: N/AA 5. Neuropsych: This patient is capable of making decisions on his own behalf. 6. Abdominal incisions (3) and colostomy: Damp to dry dressing changes twice daily. --IV Dilaudid prior to dressing change. Advised patient  to try to use oxycodone 15mg  instead of IV dilaudid since he will not have IV dilaudid at home.  7. Fluids/Electrolytes/Nutrition: Monitor intake/output.   --Continue Juven twice daily.   --Continue vitamin C and zinc to help promote wound healing.  8. Wound infection: Has completed 9. Hyponatremia: Continue 1800 cc/FR- Stable around 130. Admission CMP stable- discussed with patient.  10. Acute blood loss anemia: Monitor with serial checks.             --CBC reviewed and stable 11.  Polyarthralgias  with effusion: Continue Celebrex daily.  Appreciate ortho eval- will defer steroid injection since pain and swelling have improved with Lasix $RemoveBe'10mg'uYZiDUFMH$ . Will increase to $RemoveBef'20mg'KslNHzJSuY$  Discussed with patient that ESR and CRP are trending downward.  -Cr and K+ stable on Lasix $Remove'20mg'iJtDtTk$  daily -continue to monitor clinically 8/7 -repeat BMP tomorrow 12. New diagnosis T2DM: Hgb A1C-7.4.              --monitor BS ac/hs and use SSI for elevated BS.             --consulted RD for dietary education.  --provided with list of foods that are good for diabetes.   -d/c Boost since and use Ensure Max instead.   -educated that control has been better, discussed avoidance of juice.   >35 minutes spent in evaluation of wounds, encouragement of wife to learn wound care, discussion with PT/OT/SW regarding patient's progress, encouraging getting right knee steroid injection and aspiration given continued pain and swelling    LOS: 7 days A FACE TO FACE EVALUATION WAS PERFORMED  Shelsie Tijerino P Shivangi Lutz 09/08/2020, 10:06 AM

## 2020-09-08 NOTE — Progress Notes (Signed)
Nutrition Follow-up  DOCUMENTATION CODES:   Non-severe (moderate) malnutrition in context of acute illness/injury  INTERVENTION:  Continue Ensure Max po BID, each supplement provides 150 kcal and 30 grams of protein.   Continue Juven BID, each packet provides 95 calories, 2.5 grams of protein.  Encourage adequate PO intake.   NUTRITION DIAGNOSIS:   Moderate Malnutrition related to acute illness as evidenced by mild fat depletion, mild muscle depletion; ongoing  GOAL:   Patient will meet greater than or equal to 90% of their needs; met  MONITOR:   PO intake, Supplement acceptance, Skin, Weight trends, Labs, I & O's  REASON FOR ASSESSMENT:   Malnutrition Screening Tool    ASSESSMENT:   60 year old male with history of HTN in the past otherwise in good health who started having issues with constipation since early June with decreased appetite, 20 lbs wt loss, abdominal pain progressing to intermittent fevers X few weeks. He was admitted on 08/12/20 to [facility] with abdominal pain due to diverticular perforation with  11.5 X 5 cm abscess and fistulization to abdominal wall. He was taken to OR emergently for Exp lap with debridement of LLQ wall, splenic mobilization and  partial colectomy with end colostomy by Dr. Georgiana Spinner.  Blood cultures negative and UCS showed E Coli.  He was placed on broad-spectrum antibiotics but continued to have upward rise in white count to 41,000 as well as confusion.     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    He was made n.p.o. with TNA for nutritional support.  He was treated with 7-day course of Eraxis and Zosyn x14 days.   Pathology showed evidence of diverticulitis without malignancy.  Acute blood loss anemia treated with transfusion and reactive leukocytosis has been resolving.     He developed acute DVT in right gastrocnemius vein as well as distal R- axillary DVT and surrounding PICC and SVT in basilic vein R antecubital fossa.  Significant Events: 7/13- admission with dx of abdominal wall abscess, necrotizing soft tissue of abdominal wall, perforated diverticulitis with colocutaneous fistula; I&D LLQ abdominal wall, partial colectomy and end colostomy, and splenic mobilization; NGT placement 7/15- initial RD assessment; exploration for concern of leak and inspection of L flank, LOAs, SBR with anastomosis, enterorrhaphy x2, debridement of necrotic fat and fascia of abdominal wall 7/16- placement of double lumen PICC in R basilic; debridement of abdominal wall with sharp dissection and pulsavac of wounds 7/18- NGT removal; diet advanced to CLD 7/19- diet advanced to FLD 7/20- diet advanced to Soft 7/21- TPN stopped  Meal completion has been 100%. Pt has been tolerating his PO diet. Pt currently has Ensure max and juven ordered and has been consuming them. RD to continue with current orders to aid in caloric and protein needs. Labs and medications reviewed.   Diet Order:   Diet Order             DIET SOFT Room service appropriate? Yes; Fluid consistency: Thin; Fluid restriction: 1800 mL Fluid  Diet effective now                   EDUCATION NEEDS:   No education needs  have been identified at this time  Skin:  Skin Assessment: Reviewed RN Assessment Skin Integrity Issues:: Incisions, DTI DTI: L buttocks Incisions: Abdomen x3, closed  Last BM:  8/8 colostomy  Height:   Ht Readings from Last 1 Encounters:  09/01/20 _0  (1.778 m)    Weight:   Wt Readings from Last 1 Encounters:  09/08/20 70.5 kg   BMI:  Body mass index is 22.3 kg/m.  Estimated Nutritional Needs:   Kcal:   2400-2600  Protein:  125-140 grams  Fluid:  1.8 L/day  Corrin Parker, MS, RD, LDN RD pager number/after hours weekend pager number on Amion.

## 2020-09-08 NOTE — Progress Notes (Signed)
Occupational Therapy Weekly Progress Note  Patient Details  Name: Roger Brennan MRN: [id number] Date of Birth: 05/24/1960  Beginning of progress report period: September 02, 2020 End of progress report period: September 08, 2020  Today's Date: 09/08/2020 OT Individual Time: 1300-1330 OT Individual Time Calculation (min): 30 min   Patient has met 2 of 3 short term goals.  Pt is making slow progress towards goals with self-care tasks and inconsistent progress with transfers and mobility.  Pt is frequently hindered by pain and tightness in his R knee.  Pt continues to require mod assist with LB dressing due to RLE pain and tightness.  He is able to complete bridging to pull pants over hips at bed level but requires physical assistance for threading BLE.  Pt is able to complete squat pivot and slide board transfers with CGA and min-mod assist for stand pivot transfers with RW depending on pain level.  Pt is currently utilizing urinal for voiding and nursing staff is completing ostomy care.   Patient continues to demonstrate the following deficits: muscle joint tightness, decreased coordination, and decreased sitting balance, decreased standing balance, decreased postural control, and decreased balance strategies and therefore will continue to benefit from skilled OT intervention to enhance overall performance with BADL and Reduce care partner burden.  Patient progressing toward long term goals..  Continue plan of care.  OT Short Term Goals Week 1:  OT Short Term Goal 1 (Week 1): Pt will use LRAD to don LB clothing with mod A OT Short Term Goal 1 - Progress (Week 1): Progressing toward goal OT Short Term Goal 2 (Week 1): Pt will complete sit > stand with mod A OT Short Term Goal 2 - Progress (Week 1): Met OT Short Term Goal 3 (Week 1): Pt will complete stand pivot transfer with mod A OT Short Term Goal 3 - Progress (Week 1): Met Week 2:  OT Short Term Goal 1 (Week 2): Pt will complete LB dressing with mod  assist with AE as needed OT Short Term Goal 2 (Week 2): Pt will complete toilet transfer with min assist OT Short Term Goal 3 (Week 2): Pt will complete 2 grooming tasks in standing to demonstrate increased standing tolerance  Skilled Therapeutic Interventions/Progress Updates:    Treatment session with focus on sit > stand and dynamic standing balance during therapeutic activity.  Pt received upright in bed still with c/o pain and stiffness in RLE but agreeable to therapy session.  Pt completed bed mobility supervision to sitting EOB.  Engaged in sit > stand from elevated EOB with min assist with RW.  Engaged in toe tapping to target with focus on increased knee mobility, progressing to alternating toe tapping to increase weight shifting as needed for self-care tasks.  Pt continues to demonstrate decreased knee flexion, therefore therapist increased challenge to toe tapping to yoga block and ultimately small cone.  Pt demonstrating R hip abduction to lift leg to target, requiring cues to attempt to activate knee flexion.  Seated rest breaks provided between each activity.  Pt returned to semi-reclined position with supervision.   Therapy Documentation Precautions:  Precautions Precautions: Fall Precaution Comments: colostomy, bandages over full abdomen, R knee ROM limitations Restrictions Weight Bearing Restrictions: No General:   Vital Signs: Therapy Vitals Temp: 98.1 F (36.7 C) Temp Source: Oral Pulse Rate: 86 Resp: 15 BP: 120/69 Patient Position (if appropriate): Lying Oxygen Therapy SpO2: 100 % O2 Device: Room Air Pain: Pain Assessment Pain Scale: 0-10 Pain Score: 6  Pain Type: Chronic pain Pain Location: Knee Pain Orientation: Right Pain Descriptors / Indicators: Aching Pain Frequency: Constant Pain Onset: On-going Pain Intervention(s): Medication (See eMAR) ADL: ADL Eating: Supervision/safety, Set up Where Assessed-Eating: Bed level Grooming: Supervision/safety,  Setup Where Assessed-Grooming: Sitting at sink Upper Body Bathing: Supervision/safety, Setup Where Assessed-Upper Body Bathing: Edge of bed Lower Body Bathing: Supervision/safety, Setup Where Assessed-Lower Body Bathing: Edge of bed Upper Body Dressing: Supervision/safety, Setup Where Assessed-Upper Body Dressing: Edge of bed Lower Body Dressing: Maximal assistance Where Assessed-Lower Body Dressing: Edge of bed Toileting: Minimal assistance (urinal, colostomy) Where Assessed-Toileting: Bed level Toilet Transfer: Unable to assess Toilet Transfer Method: Stand pivot Vision   Perception    Praxis   Exercises:   Other Treatments:     Therapy/Group: Individual Therapy  Simonne Come 09/08/2020, 12:56 PM

## 2020-09-09 LAB — SYNOVIAL CELL COUNT + DIFF, W/ CRYSTALS
Eosinophils-Synovial: 0 % (ref 0–1)
Lymphocytes-Synovial Fld: 10 % (ref 0–20)
Monocyte-Macrophage-Synovial Fluid: 12 % — ABNORMAL LOW (ref 50–90)
Neutrophil, Synovial: 78 % — ABNORMAL HIGH (ref 0–25)
WBC, Synovial: 1065 /mm3 — ABNORMAL HIGH (ref 0–200)

## 2020-09-09 LAB — BASIC METABOLIC PANEL
Anion gap: 7 (ref 5–15)
BUN: 17 mg/dL (ref 6–20)
CO2: 27 mmol/L (ref 22–32)
Calcium: 9.3 mg/dL (ref 8.9–10.3)
Chloride: 95 mmol/L — ABNORMAL LOW (ref 98–111)
Creatinine, Ser: 0.7 mg/dL (ref 0.61–1.24)
GFR, Estimated: >60 mL/min (ref >60)
Glucose, Bld: 145 mg/dL — ABNORMAL HIGH (ref 70–99)
Potassium: 4 mmol/L (ref 3.5–5.1)
Sodium: 129 mmol/L — ABNORMAL LOW (ref 135–145)

## 2020-09-09 LAB — GLUCOSE, CAPILLARY
Glucose-Capillary: 150 mg/dL — ABNORMAL HIGH (ref 70–99)
Glucose-Capillary: 167 mg/dL — ABNORMAL HIGH (ref 70–99)
Glucose-Capillary: 265 mg/dL — ABNORMAL HIGH (ref 70–99)
Glucose-Capillary: 225 mg/dL — ABNORMAL HIGH (ref 70–99)

## 2020-09-09 MED ORDER — METHYLPREDNISOLONE ACETATE 40 MG/ML IJ SUSP
40.0000 mg | Freq: Once | INTRAMUSCULAR | Status: DC
  Filled 2020-09-09: qty 1

## 2020-09-09 MED ORDER — BUPIVACAINE HCL 0.5 % IJ SOLN
50.0000 mL | Freq: Once | INTRAMUSCULAR | Status: DC
  Filled 2020-09-09: qty 50

## 2020-09-09 NOTE — Progress Notes (Signed)
PROGRESS NOTE   Subjective/Complaints: CBGs continue to be better controlled- he has stopped juice. Right knee quite painful today- discussed aspiration and steroid injection today- asked RN to see what time Dr. Aurea Graff PA can come by.   ROS: Patient denies fever, rash, sore throat, blurred vision, nausea, vomiting, diarrhea, cough, shortness of breath or chest pain, headache, or mood change.  Lower extremity swelling is improving, +right knee and ankle pain, +urinary frequency, +abdominal pain during dressing changes   Objective:   No results found. Recent Labs    09/07/20 0624  WBC 10.1  HGB 9.0*  HCT 27.9*  PLT 663*    Recent Labs    09/07/20 0624 09/09/20 0501  NA 132* 129*  K 4.0 4.0  CL 98 95*  CO2 26 27  GLUCOSE 138* 145*  BUN 19 17  CREATININE 0.66 0.70  CALCIUM 9.2 9.3    Intake/Output Summary (Last 24 hours) at 09/09/2020 0934 Last data filed at 09/09/2020 9169 Gross per 24 hour  Intake 490 ml  Output 1380 ml  Net -890 ml     Pressure Injury 09/01/20 Buttocks Left Deep Tissue Pressure Injury - Purple or maroon localized area of discolored intact skin or blood-filled blister due to damage of underlying soft tissue from pressure and/or shear. Purple colored area (Active)  09/01/20 1530  Location: Buttocks  Location Orientation: Left  Staging: Deep Tissue Pressure Injury - Purple or maroon localized area of discolored intact skin or blood-filled blister due to damage of underlying soft tissue from pressure and/or shear.  Wound Description (Comments): Purple colored area  Present on Admission: Yes    Physical Exam: Vital Signs Blood pressure 117/74, pulse 98, temperature 97.6 F (36.4 C), temperature source Oral, resp. rate 16, height _0  (1.778 m), weight 68.4 kg, SpO2 98 %. Constitutional: No distress . Vital signs reviewed. HEENT: NCAT, EOMI, oral membranes moist Neck: supple Cardiovascular:  Tachcyardia   Respiratory/Chest: CTA Bilaterally without wheezes or rales. Normal effort    GI/Abdomen: BS +, non-tender, non-distended, ostomy site intact, wounds healing well     Ext: Right knee more tender to palpation today Psych: pleasant and cooperative  Skin: intact Neuro:    Mental Status: He is alert and oriented to person, place, and time. MSK: knee and ankle joints are  less tender to palpation.  Swelling has decreased   Assessment/Plan: 1. Functional deficits which require 3+ hours per day of interdisciplinary therapy in a comprehensive inpatient rehab setting. Physiatrist is providing close team supervision and 24 hour management of active medical problems listed below. Physiatrist and rehab team continue to assess barriers to discharge/monitor patient progress toward functional and medical goals  Care Tool:  Bathing    Body parts bathed by patient: Right arm, Left arm, Chest, Abdomen, Front perineal area, Right upper leg, Left upper leg, Face   Body parts bathed by helper: Right lower leg, Left lower leg, Buttocks     Bathing assist Assist Level: Supervision/Verbal cueing     Upper Body Dressing/Undressing Upper body dressing   What is the patient wearing?: Pull over shirt    Upper body assist Assist Level: Independent with assistive device  Lower Body Dressing/Undressing Lower body dressing      What is the patient wearing?: Pants     Lower body assist Assist for lower body dressing: Minimal Assistance - Patient > 75%     Toileting Toileting    Toileting assist Assist for toileting: Set up assist Assistive Device Comment: Urinal   Transfers Chair/bed transfer  Transfers assist     Chair/bed transfer assist level: Contact Guard/Touching assist     Locomotion Ambulation   Ambulation assist      Assist level: Minimal Assistance - Patient > 75% Assistive device: Walker-rolling Max distance: 66f   Walk 10 feet  activity   Assist     Assist level: Minimal Assistance - Patient > 75% Assistive device: Walker-rolling   Walk 50 feet activity   Assist Walk 50 feet with 2 turns activity did not occur: Safety/medical concerns         Walk 150 feet activity   Assist Walk 150 feet activity did not occur: Safety/medical concerns         Walk 10 feet on uneven surface  activity   Assist Walk 10 feet on uneven surfaces activity did not occur: Safety/medical concerns         Wheelchair     Assist Will patient use wheelchair at discharge?: Yes Type of Wheelchair: Manual    Wheelchair assist level: Supervision/Verbal cueing Max wheelchair distance: 1218f   Wheelchair 50 feet with 2 turns activity    Assist        Assist Level: Supervision/Verbal cueing   Wheelchair 150 feet activity     Assist      Assist Level: Maximal Assistance - Patient 25 - 49%   Blood pressure 117/74, pulse 98, temperature 97.6 F (36.4 C), temperature source Oral, resp. rate 16, height _0  (1.778 m), weight 68.4 kg, SpO2 98 %.  Medical Problem List and Plan: 1.  Debility             -patient may shower but wound must be covered             -ELOS/Goals: 2-3 weeks MinA         Continue CIR  -right knee pain limiting therapy, but he is trying his best.  2.  L-gastroc DVT: continue Eliquis. No thrombus seen on dopplers 8/6. There are swollen groin LN bilaterally- discussed that this could be a response to infection.              -antiplatelet therapy: N/A 3. Pain from bilateral lower extremity swelling:  Continue Oxycodone prn- currently requiring 1525mue to pain from swelling --IV dilaudid prior to dressing changes BID --Robaxin 1000 mg tid Ice/elevation Cr stable on Lasix- continue low dose supplementation. See #11 Added voltaren gel scheduled for right knee Provided list of foods that can help with pain.  Advised patient to try using oxycodone 60m71m minutes prior to  therapy sessions for maximal benefit.  4. Mood: LCSW to follow for evaluation and support.             -antipsychotic agents: N/AA 5. Neuropsych: This patient is capable of making decisions on his own behalf. 6. Abdominal incisions (3) and colostomy: Damp to dry dressing changes twice daily. --IV Dilaudid prior to dressing change. Advised patient to try to use oxycodone 60mg28mtead of IV dilaudid since he will not have IV dilaudid at home. Please initiate education of wife on dressing changes.  7. Fluids/Electrolytes/Nutrition: Monitor intake/output.   --  Continue Juven twice daily.   --Continue vitamin C and zinc to help promote wound healing.  8. Wound infection: Has completed 9. Hyponatremia: Continue 1800 cc/FR- Stable around 130. Admission CMP stable- discussed with patient.  10. Acute blood loss anemia: Monitor with serial checks.             --CBC reviewed and stable 11.  Polyarthralgias with effusion: Continue Celebrex daily.  Appreciate ortho eval- would like to proceed with aspiration and steroid injection today- will contact ortho. Continue Lasix 10m daily. Discussed with patient that ESR and CRP are trending downward.  -Cr and K+ stable on Lasix 277mdaily  12. New diagnosis T2DM: Hgb A1C-7.4.              --monitor BS ac/hs and use SSI for elevated BS.             --consulted RD for dietary education.  --provided with list of foods that are good for diabetes.   -d/c Boost since and use Ensure Max instead.   -educated that control has been better, discussed avoidance of juice.     LOS: 8 days A FACE TO FACE EVALUATION WAS PERFORMED  KrClide Deutscheraulkar 09/09/2020, 9:34 AM

## 2020-09-09 NOTE — Progress Notes (Signed)
Physical Therapy Session Note  Patient Details  Name: Roger Brennan MRN: [id number] Date of Birth: 22-Feb-1960  Today's Date: 09/09/2020 PT Individual Time: 1000-1054 and 1345-1420 PT Individual Time Calculation (min): 54 min and 35 min PT Missed Time: 10 minutes PT Missed Time Reason: Pain  Short Term Goals: Week 1:  PT Short Term Goal 1 (Week 1): Pt will perform STS ModA PT Short Term Goal 2 (Week 1): Pt will ambulate 25 ft MinA PT Short Term Goal 3 (Week 1): Pt will initiate stair training PT Short Term Goal 4 (Week 1): Pt will improve RLE ROM to 75+ flexion  Skilled Therapeutic Interventions/Progress Updates:   Treatment Session 1 Received pt semi-reclined in bed, pt agreeable to PT treatment, and reported pain and tightness in R knee (premedicated). Repositioning, rest breaks, and distraction done to reduce pain levels. Pt reported they are coming to drain some of the fluid from his knee at some point today. Session with emphasis on functional mobility, generalized strengthening and ROM, dynamic standing balance/coordination, and improved endurance with activity. Pt requested to do some "warm up" exercises prior to sitting up. Performed AAROM into knee flexion 2x10 on RLE and AROM into knee flexion x10 on LLE. Pt performed bed mobility with supervision x 2 trials throughout session with HOB slightly elevated. Upon sitting EOB pt stated "I just can't stand up and walk right now" and requested to continue working on ROM. Worked on the following exercises sitting EOB with emphasis on ROM: -active assisted heel slides on RLE using gait belt 2x10 -able to achieve 75 degrees knee flexion -passive R knee flexion with sustained hold for ~ 45 seconds  x 2 with knee held in 75 degrees flexion -seated hamstring stretch with gait belt 3x20 seconds bilaterally  -hip adduction pillow squeezes x12 with sustained R knee flexion hold at 75 degrees With encouragement, transitioned to sit<>stands from  elevated bed with RW and min A x 1 rep and performed heel raises x10 with BUE support and CGA with emphasis on ankle ROM. Concluded session with pt semi-reclined in bed, needs within reach, and bed alarm on. Placed pillow underneath R knee to promote knee flexion.   Treatment Session 2 Received pt sitting in WC, pt agreeable to PT treatment, and continues to report pain and tightness R knee (premedicated). Pt reports that they came and took fluid off R knee earlier today and recommended that he avoid "overdoing" it during therapy. Repositioning, rest breaks, and distraction done to reduce pain levels. Session with emphasis on functional mobility/transfers and generalized strengthening and ROM. Pt transported to/from ortho gym in [facility] total A for time management purposes and transferred on/off Nustep via squat<>pivot with CGA and performed BUE/LE strengthening on Nustep at workload 4 for 10 minutes for a total of 204 steps with emphasis on R knee flexion ROM. Pt able to achieve ~75 degrees R knee flexion and able to get LEs on/off Nustep with greater ease compared to previous sessions. Pt politely requested to return to room to rest and transferred WC<>bed via lateral scoot. Concluded session with pt sitting EOB eating lunch with RN present at bedside and all needs within reach. Pt agreed to place pillow under R knee when laying down to promote knee flexion. 10 minutes missed of skilled physical therapy due to pain.   Therapy Documentation Precautions:  Precautions Precautions: Fall Precaution Comments: colostomy, bandages over full abdomen, R knee ROM limitations Restrictions Weight Bearing Restrictions: No  Therapy/Group: Individual Therapy Roger Brennan  Becky Sax PT, DPT   09/09/2020, 7:34 AM

## 2020-09-09 NOTE — Procedures (Signed)
Procedure: Right knee aspiration and injection   Indication: Right knee effusion(s)   Surgeon: Silvestre Gunner, PA-C   Assist: None   Anesthesia: Topical refrigerant   EBL: None   Complications: None   Findings: After risks/benefits explained patient desires to undergo procedure. Consent obtained and time out performed. The right knee was sterilely prepped and aspirated. 46m clear yellow fluid obtained. 663m0.5% Marcaine and '40mg'$  depomedrol instilled. Pt tolerated the procedure well.       MiLisette AbuPA-C Orthopedic Surgery [phone]

## 2020-09-09 NOTE — Progress Notes (Signed)
Occupational Therapy Session Note  Patient Details  Name: Roger Brennan MRN: [id number] Date of Birth: 1960/08/21  Today's Date: 09/09/2020 OT Individual Time: [id number] and [id number] OT Individual Time Calculation (min): 50 min and 43 min and Today's Date: 09/09/2020 OT Missed Time: 10 Minutes Missed Time Reason: Pain   Short Term Goals: Week 2:  OT Short Term Goal 1 (Week 2): Pt will complete LB dressing with mod assist with AE as needed OT Short Term Goal 2 (Week 2): Pt will complete toilet transfer with min assist OT Short Term Goal 3 (Week 2): Pt will complete 2 grooming tasks in standing to demonstrate increased standing tolerance  Skilled Therapeutic Interventions/Progress Updates:   1) Treatment session with focus on self-care retraining and functional transfers.  Pt received upright in bed agreeable to session.  Pt expressing desire to wash up at sink this session.  Pt completed bed mobility supervision and squat pivot transfer bed > w/c with CGA.  Pt reports increased pain in R knee this session.  Pt able to complete w/c mobility around room with supervision.  Engaged in bathing at sink side with pt able to retreive items from cabinet.  Pt completed UB bathing and dressing supervision.  Pt able to wash buttocks with lateral leans.  Therapist educated on use of reacher to don short with pt requiring cues to don RLE first to increase success.  Pt able to pull shorts up with lateral leans but ultimately requiring boosting for therapist to pull shorts fully over hips.  Pt utilized sock aide to don socks with increased time and effort when attempting to pull on R sock.  Pt reports increased pain in R knee and requesting to return to bed.  Pt completed squat pivot transfer CGA and returned to semi-reclined with legs elevated and ice applied to R knee.  Pt missed remaining 10 mins due to pain in R knee.  2) Treatment session with focus on AAROM to RLE in preparation for transfers and increased  activity tolerance.  Pt received semi-reclined in bed with legs elevated reporting R knee aspiration and injection completed ~30 mins before session.  Pt reports still having a lot of pain and stiffness but wanting to get to EOB for activity.  Pt completed bed mobility supervision.  Engaged in ankle pumps, heel slides, modified LAQ with focus on increased knee flexion and tolerance of mobility during each exercise.  Pt's wife present and asking questions about impact of knee pain on mobility and current d/c date.  Discussed pt level with mobility and self-care tasks despite pain in knee.  Pt completed squat pivot transfer bed > w/c with CGA and left upright in w/c with all needs in reach awaiting PT.  Therapy Documentation Precautions:  Precautions Precautions: Fall Precaution Comments: colostomy, bandages over full abdomen, R knee ROM limitations Restrictions Weight Bearing Restrictions: No General: General OT Amount of Missed Time: 10 Minutes Vital Signs: Therapy Vitals Temp: 97.6 F (36.4 C) Temp Source: Oral Pulse Rate: 98 Resp: 16 BP: 117/74 Patient Position (if appropriate): Lying Oxygen Therapy SpO2: 98 % O2 Device: Room Air Pain: Pain Assessment Pain Score: 0-No pain   Therapy/Group: Individual Therapy  Simonne Come 09/09/2020, 8:26 AM

## 2020-09-10 LAB — GLUCOSE, CAPILLARY
Glucose-Capillary: 174 mg/dL — ABNORMAL HIGH (ref 70–99)
Glucose-Capillary: 105 mg/dL — ABNORMAL HIGH (ref 70–99)
Glucose-Capillary: 236 mg/dL — ABNORMAL HIGH (ref 70–99)
Glucose-Capillary: 246 mg/dL — ABNORMAL HIGH (ref 70–99)

## 2020-09-10 NOTE — Progress Notes (Signed)
PROGRESS NOTE   Subjective/Complaints: Right knee feeling very good after aspiration and steroid injection- range of motions has improved. Discussed that WBCs in synovial fluid have reduced as compared to last time.   ROS: Patient denies fever, rash, sore throat, blurred vision, nausea, vomiting, diarrhea, cough, shortness of breath or chest pain, headache, or mood change.  Lower extremity swelling is improving, +right knee and ankle pain, +urinary frequency, +abdominal pain during dressing changes, improved range of motion of right knee   Objective:   No results found. No results for input(s): WBC, HGB, HCT, PLT in the last 72 hours.   Recent Labs    09/09/20 0501  NA 129*  K 4.0  CL 95*  CO2 27  GLUCOSE 145*  BUN 17  CREATININE 0.70  CALCIUM 9.3    Intake/Output Summary (Last 24 hours) at 09/10/2020 1016 Last data filed at 09/10/2020 0945 Gross per 24 hour  Intake 360 ml  Output 1100 ml  Net -740 ml     Pressure Injury 09/01/20 Buttocks Left Deep Tissue Pressure Injury - Purple or maroon localized area of discolored intact skin or blood-filled blister due to damage of underlying soft tissue from pressure and/or shear. Purple colored area (Active)  09/01/20 1530  Location: Buttocks  Location Orientation: Left  Staging: Deep Tissue Pressure Injury - Purple or maroon localized area of discolored intact skin or blood-filled blister due to damage of underlying soft tissue from pressure and/or shear.  Wound Description (Comments): Purple colored area  Present on Admission: Yes     Pressure Injury 09/09/20 Buttocks Right Stage 2 -  Partial thickness loss of dermis presenting as a shallow open injury with a red, pink wound bed without slough. (Active)  09/09/20 1400  Location: Buttocks  Location Orientation: Right  Staging: Stage 2 -  Partial thickness loss of dermis presenting as a shallow open injury with a red, pink  wound bed without slough.  Wound Description (Comments):   Present on Admission: No    Physical Exam: Vital Signs Blood pressure (!) 130/97, pulse 87, temperature (!) 97.5 F (36.4 C), temperature source Oral, resp. rate 20, height $RemoveBe'5\' 10"'OXJEuURJS$  (1.778 m), weight 67.9 kg, SpO2 100 %. Constitutional: No distress . Vital signs reviewed. HEENT: NCAT, EOMI, oral membranes moist Neck: supple Cardiovascular: Tachcyardia   Respiratory/Chest: CTA Bilaterally without wheezes or rales. Normal effort    GI/Abdomen: BS +, non-tender, non-distended, ostomy site intact, wounds healing well     Ext: Right knee more tender to palpation today Psych: pleasant and cooperative  Skin: intact Neuro:    Mental Status: He is alert and oriented to person, place, and time. MSK: knee and ankle joints are  less tender to palpation.  Swelling has decreased. Improved range of motion   Assessment/Plan: 1. Functional deficits which require 3+ hours per day of interdisciplinary therapy in a comprehensive inpatient rehab setting. Physiatrist is providing close team supervision and 24 hour management of active medical problems listed below. Physiatrist and rehab team continue to assess barriers to discharge/monitor patient progress toward functional and medical goals  Care Tool:  Bathing    Body parts bathed by patient: Right arm, Left arm,  Chest, Abdomen, Front perineal area, Right upper leg, Left upper leg, Face   Body parts bathed by helper: Right lower leg, Left lower leg, Buttocks     Bathing assist Assist Level: Supervision/Verbal cueing     Upper Body Dressing/Undressing Upper body dressing   What is the patient wearing?: Pull over shirt    Upper body assist Assist Level: Independent with assistive device    Lower Body Dressing/Undressing Lower body dressing      What is the patient wearing?: Pants     Lower body assist Assist for lower body dressing: Minimal Assistance - Patient > 75%      Toileting Toileting    Toileting assist Assist for toileting: Set up assist Assistive Device Comment: Urinal   Transfers Chair/bed transfer  Transfers assist     Chair/bed transfer assist level: Contact Guard/Touching assist     Locomotion Ambulation   Ambulation assist      Assist level: Minimal Assistance - Patient > 75% Assistive device: Walker-rolling Max distance: 60ft   Walk 10 feet activity   Assist     Assist level: Minimal Assistance - Patient > 75% Assistive device: Walker-rolling   Walk 50 feet activity   Assist Walk 50 feet with 2 turns activity did not occur: Safety/medical concerns         Walk 150 feet activity   Assist Walk 150 feet activity did not occur: Safety/medical concerns         Walk 10 feet on uneven surface  activity   Assist Walk 10 feet on uneven surfaces activity did not occur: Safety/medical concerns         Wheelchair     Assist Will patient use wheelchair at discharge?: Yes Type of Wheelchair: Manual    Wheelchair assist level: Supervision/Verbal cueing Max wheelchair distance: 143ft    Wheelchair 50 feet with 2 turns activity    Assist        Assist Level: Supervision/Verbal cueing   Wheelchair 150 feet activity     Assist      Assist Level: Maximal Assistance - Patient 25 - 49%   Blood pressure (!) 130/97, pulse 87, temperature (!) 97.5 F (36.4 C), temperature source Oral, resp. rate 20, height 5\' 10"  (1.778 m), weight 67.9 kg, SpO2 100 %.  Medical Problem List and Plan: 1.  Debility             -patient may shower but wound must be covered             -ELOS/Goals: 2-3 weeks MinA         Continue CIR  -right knee pain limiting therapy, but he is trying his best.  2.  L-gastroc DVT: continue Eliquis. No thrombus seen on dopplers 8/6. There are swollen groin LN bilaterally- discussed that this could be a response to infection.              -antiplatelet therapy: N/A 3.  Pain from bilateral lower extremity swelling:  Continue Oxycodone prn- currently requiring 15mg  due to pain from swelling --IV dilaudid prior to dressing changes BID --Robaxin 1000 mg tid Ice/elevation Cr stable on Lasix- continue low dose supplementation. See #11 Added voltaren gel scheduled for right knee Provided list of foods that can help with pain.  Advised patient to try using oxycodone 15mg  30 minutes prior to therapy sessions for maximal benefit.  4. Mood: LCSW to follow for evaluation and support.             -  antipsychotic agents: N/AA 5. Neuropsych: This patient is capable of making decisions on his own behalf. 6. Abdominal incisions (3) and colostomy: Damp to dry dressing changes twice daily. --IV Dilaudid prior to dressing change. Advised patient to try to use oxycodone $RemoveBefor'15mg'CeYnmOsMvRvu$  instead of IV dilaudid since he will not have IV dilaudid at home. Please initiate education of wife on dressing changes.  7. Fluids/Electrolytes/Nutrition: Monitor intake/output.   --Continue Juven twice daily.   --Continue vitamin C and zinc to help promote wound healing.  8. Wound infection: Has completed 9. Hyponatremia: Continue 1800 cc/FR- Stable around 130. Admission CMP stable- discussed with patient.  10. Acute blood loss anemia: Monitor with serial checks.             --CBC reviewed and stable 11.  Polyarthralgias with effusion: Continue Celebrex daily.  Good results from ortho aspiration. Continue Lasix $RemoveBefore'20mg'YTGayvrlRhhJu$  daily. Discussed with patient that ESR and CRP are trending downward.  -Cr and K+ stable on Lasix $Remove'20mg'nWmyQCJ$  daily -discussed results of synovial fluid sample- WBC improving 12. New diagnosis T2DM: Hgb A1C-7.4.              --monitor BS ac/hs and use SSI for elevated BS.             --consulted RD for dietary education.  --provided with list of foods that are good for diabetes.   -d/c Boost since and use Ensure Max instead.   -educated that control has been better, discussed avoidance of juice.      LOS: 9 days A FACE TO FACE EVALUATION WAS PERFORMED  Martha Clan P Delta Deshmukh 09/10/2020, 10:16 AM

## 2020-09-10 NOTE — Progress Notes (Signed)
Occupational Therapy Session Note  Patient Details  Name: Roger Brennan MRN: [id number] Date of Birth: 06-01-1960  Today's Date: 09/10/2020 OT Individual Time: 6160-7371 OT Individual Time Calculation (min): 73 min    Short Term Goals: Week 1:  OT Short Term Goal 1 (Week 1): Pt will use LRAD to don LB clothing with mod A OT Short Term Goal 1 - Progress (Week 1): Progressing toward goal OT Short Term Goal 2 (Week 1): Pt will complete sit > stand with mod A OT Short Term Goal 2 - Progress (Week 1): Met OT Short Term Goal 3 (Week 1): Pt will complete stand pivot transfer with mod A OT Short Term Goal 3 - Progress (Week 1): Met Week 2:  OT Short Term Goal 1 (Week 2): Pt will complete LB dressing with mod assist with AE as needed OT Short Term Goal 2 (Week 2): Pt will complete toilet transfer with min assist OT Short Term Goal 3 (Week 2): Pt will complete 2 grooming tasks in standing to demonstrate increased standing tolerance  Skilled Therapeutic Interventions/Progress Updates:    Pt greeted at time of session sitting up in wheelchair agreeable to OT session but wanting to "take it easy" after walking this morning. Pt wanting to use shower cap at sink level but educated that we can use hair washing board. Pt agreeable, therapist assisting with hair washing at sink level and pt self propelling throughout room to gather items, organize drawers, etc with Supervision. Pt grooming and drying hair with set up. LB dressing for changing shorts with reacher to doff and lateral leans to get past hip level and reacher to get off floor. Donned new shorts CGA with pt now able to thread with anterior weight shift d/t improved knee flexion today and sit <> stand level to get over hips. Self propel room <> gym Supervision and focused on BUE there ex w/ initially 4# dowel and increased to 9# total for the following: bicep curl, chest press, overhead press, and FWD/backward circles. 1x10 knee flexion/extension with  towel slides on floor and overpressure for knee flexion. Back in room, squat pivot > bed Supervision. Alarm on call bell in reach.   Therapy Documentation Precautions:  Precautions Precautions: Fall Precaution Comments: colostomy, bandages over full abdomen, R knee ROM limitations Restrictions Weight Bearing Restrictions: No     Therapy/Group: Individual Therapy  Viona Gilmore 09/10/2020, 7:21 AM

## 2020-09-10 NOTE — Progress Notes (Signed)
Physical Therapy Weekly Progress Note  Patient Details  Name: Roger Brennan MRN: [id number] Date of Birth: [birth year]  Beginning of progress report period: September 02, 2020 End of progress report period: September 10, 2020  Patient has met 2 of 4 short term goals. Pt demonstrates slow progress towards long term goals. Pt is currently able to perform bed mobility with supervision with HOB elevated, sit<>stand transfers with min A from elevated bed but mod A from standard height surfaces, squat<>pivots/lateral scoots with CGA, and ambulate ~12f with RW and min A. Pt fluctuates with mobility and progress in therapy due to high pain levels and significantly decreased ROM (specifically in R knee).   Patient continues to demonstrate the following deficits muscle weakness and muscle joint tightness, decreased cardiorespiratoy endurance, and decreased standing balance, decreased postural control, and decreased balance strategies and therefore will continue to benefit from skilled PT intervention to increase functional independence with mobility.  Patient progressing toward long term goals..  Continue plan of care.  PT Short Term Goals Week 1:  PT Short Term Goal 1 (Week 1): Pt will perform STS ModA PT Short Term Goal 1 - Progress (Week 1): Met PT Short Term Goal 2 (Week 1): Pt will ambulate 25 ft MinA PT Short Term Goal 2 - Progress (Week 1): Met PT Short Term Goal 3 (Week 1): Pt will initiate stair training PT Short Term Goal 3 - Progress (Week 1): Progressing toward goal PT Short Term Goal 4 (Week 1): Pt will improve RLE ROM to 75+ flexion PT Short Term Goal 4 - Progress (Week 1): Progressing toward goal Week 2:  PT Short Term Goal 1 (Week 2): pt will perform sit<>stand transfers with min A consistantly PT Short Term Goal 2 (Week 2): Pt will initiate stair training PT Short Term Goal 3 (Week 2): Pt will improve RLE ROM to 75+ flexion  Skilled Therapeutic Interventions/Progress Updates:   Ambulation/gait training;Cognitive remediation/compensation;Discharge planning;DME/adaptive equipment instruction;Balance/vestibular training;Community reintegration;Disease management/prevention;Neuromuscular re-education;Functional electrical stimulation;Patient/family education;Skin care/wound mLandscape architectTherapeutic Exercise;UE/LE Coordination activities;Wheelchair propulsion/positioning;Functional mobility training;Pain management;Psychosocial support;Splinting/orthotics;Therapeutic Activities;UE/LE Strength taining/ROM;Visual/perceptual remediation/compensation   Therapy Documentation Precautions:  Precautions Precautions: Fall Precaution Comments: colostomy, bandages over full abdomen, R knee ROM limitations Restrictions Weight Bearing Restrictions: No  AYoel KaufholdPT, DPT  09/10/2020, 7:25 AM

## 2020-09-10 NOTE — Progress Notes (Signed)
Physical Therapy Session Note  Patient Details  Name: Roger Brennan MRN: [id number] Date of Birth: March 14, 1960  Today's Date: 09/10/2020 PT Individual Time: 2409-7353 and 2992-4268  PT Individual Time Calculation (min): 68 min and 39 min  Short Term Goals: Week 1:  PT Short Term Goal 1 (Week 1): Pt will perform STS ModA PT Short Term Goal 1 - Progress (Week 1): Met PT Short Term Goal 2 (Week 1): Pt will ambulate 25 ft MinA PT Short Term Goal 2 - Progress (Week 1): Met PT Short Term Goal 3 (Week 1): Pt will initiate stair training PT Short Term Goal 3 - Progress (Week 1): Progressing toward goal PT Short Term Goal 4 (Week 1): Pt will improve RLE ROM to 75+ flexion PT Short Term Goal 4 - Progress (Week 1): Progressing toward goal Week 2:  PT Short Term Goal 1 (Week 2): pt will perform sit<>stand transfers with min A consistantly PT Short Term Goal 2 (Week 2): Pt will initiate stair training PT Short Term Goal 3 (Week 2): Pt will improve RLE ROM to 75+ flexion  Skilled Therapeutic Interventions/Progress Updates:  Treatment Session 1 Received pt semi-reclined in bed, pt agreeable to PT treatment, and reports pain and tightness in R knee (premedicated) but overall less "throbbing" compared to previous days. Session with emphasis on functional mobility/transfers, generalized strengthening and R knee ROM, dynamic standing balance/coordination, gait training, and improved endurance with activity. Pt requested to "warm up" prior to getting up, therefore performed the following exercises in bed: -RLE AAROM into knee flexion 2x10 with pt able to reach ~45 degrees -RLE SAQ 2x10 -LLE AROM into knee flexion 2x10 with pt able to reach ~85 degrees Pt transferred semi-reclined<>sitting EOB with supervision and HOB elevated and doffed dirty shirt and applied deodorant and donned clean shirt with supervision. Sit<>stand from elevated bed with min A using RW and ambulated 69f x 1 and 680fx 1 with RW and  CGA/min A. Pt demonstrated improvement in upright posture/gaze, R knee flexion/extension, and improved RLE foot clearance but continues to be limited by fatigue/pain. Pt performed seated active assisted towel slides 2x12 into R knee flexion with pt able to reach ~80 degrees knee flexion today! Sit<>stand at staircase with min A and performed RLE toe taps to 3in step 2x20 with BUE support fading to 1 UE support and CGA for balance with emphasis on R knee flexion ROM. Sit<>stands at table in dayroom with min A x 3 reps and performed the following exercises standing with BUE support and CGA for balance: -alternating marching x10 bilaterally  -mini squats x12 -emphasis on equal weight distribution and knee flexion  -hip abduction x10 bilaterally  [facility]. Concluded session with pt sitting in WC, needs within reach, and chair pad alarm on. Provided pt with ensure drink. Pt continues to require frequent rest breaks throughout session due to pain/fatigue.   Treatment Session 2 Received pt semi-reclined in bed, pt agreeable to PT treatment, and continues to report pain and tightness in R knee (un-rated), but overall improved since yesterday. RN notified and present to administer pain medication. Session with emphasis on functional mobility/transfers, generalized strengthening and R knee ROM, dynamic standing balance/coordination, gait training, and improved endurance with activity. Pt performed bed mobility with supervision with HOB elevated x 2 trials throughout session. Sit<>stand with RW and min A and ambulated 4552f 1 and 50f25f1 with RW and min A. Pt then performed seated BLE  strengthening on Kinetron at 20 cm/sec for 1 minute x 4 trials with BUE support with emphasis on glute/quad strengthening and R knee ROM. Pt able to achieve ~80 degrees R knee flexion with much less discomfort! Pt transported back to room in WC total A and transferred WC<>bed squat<>pivot with CGA and  able to void in urinal with set up assist. Discussed practicing going to the bathroom and toileting there to simulate home environment and pt agreed to "try" this within the next few days. Concluded session with pt semi-reclined in bed, needs within reach, and bed alarm on. Pillow positioned under R knee to promote knee flexion.   Therapy Documentation Precautions:  Precautions Precautions: Fall Precaution Comments: colostomy, bandages over full abdomen, R knee ROM limitations Restrictions Weight Bearing Restrictions: No  Therapy/Group: Individual Therapy Anna M Johnson Anna Johnson PT, DPT   09/10/2020, 7:30 AM  

## 2020-09-11 LAB — GLUCOSE, CAPILLARY
Glucose-Capillary: 130 mg/dL — ABNORMAL HIGH (ref 70–99)
Glucose-Capillary: 149 mg/dL — ABNORMAL HIGH (ref 70–99)
Glucose-Capillary: 173 mg/dL — ABNORMAL HIGH (ref 70–99)
Glucose-Capillary: 147 mg/dL — ABNORMAL HIGH (ref 70–99)

## 2020-09-11 MED ORDER — POLYETHYLENE GLYCOL 3350 17 G PO PACK
17.0000 g | PACK | Freq: Two times a day (BID) | ORAL | Status: DC
  Administered 2020-09-11 – 2020-09-22 (×22): 17 g via ORAL
  Filled 2020-09-11 (×22): qty 1

## 2020-09-11 NOTE — Progress Notes (Signed)
Physical Therapy Session Note  Patient Details  Name: Roger Brennan MRN: [id number] Date of Birth: [birth year]  Today's Date: 09/11/2020 PT Individual Time: 1000-1053 and 3267-1245  PT Individual Time Calculation (min): 53 min and 55 min  Short Term Goals: Week 1:  PT Short Term Goal 1 (Week 1): Pt will perform STS ModA PT Short Term Goal 1 - Progress (Week 1): Met PT Short Term Goal 2 (Week 1): Pt will ambulate 25 ft MinA PT Short Term Goal 2 - Progress (Week 1): Met PT Short Term Goal 3 (Week 1): Pt will initiate stair training PT Short Term Goal 3 - Progress (Week 1): Progressing toward goal PT Short Term Goal 4 (Week 1): Pt will improve RLE ROM to 75+ flexion PT Short Term Goal 4 - Progress (Week 1): Progressing toward goal Week 2:  PT Short Term Goal 1 (Week 2): pt will perform sit<>stand transfers with min A consistantly PT Short Term Goal 2 (Week 2): Pt will initiate stair training PT Short Term Goal 3 (Week 2): Pt will improve RLE ROM to 75+ flexion  Skilled Therapeutic Interventions/Progress Updates:   Treatment Session 1 Received pt semi-reclined in bed, pt agreeable to PT treatment, and did not state pain level and reported his knee still feels good from yesterday. Session with emphasis on functional mobility/transfers, generalized strengthening and R knee ROM, dynamic standing balance/coordination, gait training, and improved activity tolerance. Pt performed bed mobility with supervision with HOB elevated x 2 trials throughout session. Sit<>stands with RW and CGA throughout session and pt ambulated 44f x 1, 676fx 1, 10060f 1, and 67f15f1 with RW and CGA with seated rest breaks in between. Pt demonstrates decreased cadence with downward gaze and flexed trunk but significantly improved R knee flexion/extension with gait. MD present for morning rounds and informed pt of increase in amount of uric acid crystals indicating gout; educated pt on pathophysiology behind gout as well as  certain foods to avoid. Worked on blocked practice sit<>stands from mat 3x10 with 1 UE support on RW and CGA with emphasis on quad strength and increasing R knee flexion ROM. Stand<>pivot mat<>WC with RW and CGA and [facility] Stand<>pivot WC<>bed without AD and CGA. Concluded session with pt semi-reclined in bed, needs within reach, and bed alarm on. Pillow positioned under R knee to promote flexion.   Of note, pt continues to demonstrate improvements in R knee flexion and able to achieve ~85 degrees today.   Treatment Session 2 Received pt semi-reclined in bed, frustrated and waiting for RN to perform ostomy care, pt agreeable to PT treatment, and did not report pain level during session but expressed wanting to stay on top of his pain. Session with emphasis on functional mobility/transfers, generalized strengthening and R knee ROM, dynamic standing balance/coordination, gait training, and improved activity tolerance. Pt performed bed mobility with supervision with HOB elevated and transferred stand<>pivot bed<>WC without AD and CGA and donned shoes with max A. Pt performed WC mobility using BLEs and supervision for 75ft39f and 50ft 50fwith emphasis on knee flexion ROM R>L - min cues for technique. Pt performed ambulatory simulated car transfer with RW and CGA and RN present to administer pain medications. Pt then ambulated 10ft o58feven surfaces (ramp) with RW and CGA/min A with 1 posterior LOB when turning. Pt then ambulated 55ft x 41fials with RW and CGA. Pt performed lateral side stepping for 50ft wit38f  UE support and CGA/close supervision with emphasis on hip abductor strengthening. Pt transferred WC<>bed stand<>pivot without AD and CGA. Concluded session with pt sitting EOB with all needs within reach preparing to use urinal.   Therapy Documentation Precautions:  Precautions Precautions: Fall Precaution Comments: colostomy, bandages over full abdomen, R knee ROM  limitations Restrictions Weight Bearing Restrictions: No  Therapy/Group: Individual Therapy Agustin Swatek PT, DPT   09/11/2020, 7:29 AM

## 2020-09-11 NOTE — Progress Notes (Signed)
Occupational Therapy Session Note  Patient Details  Name: Roger Brennan MRN: [id number] Date of Birth: 24-Apr-1960  Today's Date: 09/11/2020 OT Individual Time: [id number] OT Individual Time Calculation (min): 58 min    Short Term Goals: Week 2:  OT Short Term Goal 1 (Week 2): Pt will complete LB dressing with mod assist with AE as needed OT Short Term Goal 2 (Week 2): Pt will complete toilet transfer with min assist OT Short Term Goal 3 (Week 2): Pt will complete 2 grooming tasks in standing to demonstrate increased standing tolerance  Skilled Therapeutic Interventions/Progress Updates:    Treatment session with focus on functional mobility, dynamic standing balance, and endurance.  Pt received upright in bed reporting improvements in knee.  Pt able to don socks at bed level with increased time but no use of AE.  Pt completed bed mobility supervision and completed sit > stand with supervision to pull pants over hips.  Pt ambulated 73' with RW with CGA and w/c follow due to decreased endurance.  Pt transported reminder of way to therapy gym in w/c.  Pt completed stand pivot transfer to Nustep with RW CGA.  Pt completed 6 mins on level 1 resistance and 4 mins on level 2 for increased work load, maintaining steps 30-40s per minute decreasing with increased work load.  Pt reports discomfort in knee, but "not like before".  Engaged in alternating step ups onto 3" step, completing 2 sets of 15 with CGA and BUE support on RW.  Pt ambulated 68' with RW with CGA and w/c follow towards room.  Pt completed stand pivot transfer w/c > bed CGA.  Pt returned so semi-reclined and left with all needs in reach.  Therapy Documentation Precautions:  Precautions Precautions: Fall Precaution Comments: colostomy, bandages over full abdomen, R knee ROM limitations Restrictions Weight Bearing Restrictions: No General:   Vital Signs:   Pain: Pain Assessment Pain Scale: 0-10 Pain Score: 5  Pain Location:  Knee Pain Orientation: Right Pain Descriptors / Indicators: Aching Pain Intervention(s): Medication (See eMAR) ADL: ADL Eating: Supervision/safety, Set up Where Assessed-Eating: Bed level Grooming: Supervision/safety, Setup Where Assessed-Grooming: Sitting at sink Upper Body Bathing: Supervision/safety, Setup Where Assessed-Upper Body Bathing: Edge of bed Lower Body Bathing: Supervision/safety, Setup Where Assessed-Lower Body Bathing: Edge of bed Upper Body Dressing: Supervision/safety, Setup Where Assessed-Upper Body Dressing: Edge of bed Lower Body Dressing: Maximal assistance Where Assessed-Lower Body Dressing: Edge of bed Toileting: Minimal assistance (urinal, colostomy) Where Assessed-Toileting: Bed level Toilet Transfer: Unable to assess Toilet Transfer Method: Stand pivot Vision   Perception    Praxis   Exercises:   Other Treatments:     Therapy/Group: Individual Therapy  Simonne Come 09/11/2020, 8:01 AM

## 2020-09-11 NOTE — Progress Notes (Signed)
PROGRESS NOTE   Subjective/Complaints: Sleeping- I did not wake him Labs reviewed- CBGs still elevated likely from steroid injection. Synovial fluid is positive for intracellular monosodium urate crystals- will attempt to see later in the morning to discuss with him  ROS: Patient denies fever, rash, sore throat, blurred vision, nausea, vomiting, diarrhea, cough, shortness of breath or chest pain, headache, or mood change.  Lower extremity swelling is improving, +right knee and ankle pain, +urinary frequency, +abdominal pain during dressing changes, improved range of motion of right knee   Objective:   No results found. No results for input(s): WBC, HGB, HCT, PLT in the last 72 hours.   Recent Labs    09/09/20 0501  NA 129*  K 4.0  CL 95*  CO2 27  GLUCOSE 145*  BUN 17  CREATININE 0.70  CALCIUM 9.3    Intake/Output Summary (Last 24 hours) at 09/11/2020 0848 Last data filed at 09/11/2020 2641 Gross per 24 hour  Intake 620 ml  Output 2350 ml  Net -1730 ml     Pressure Injury 09/01/20 Buttocks Left Deep Tissue Pressure Injury - Purple or maroon localized area of discolored intact skin or blood-filled blister due to damage of underlying soft tissue from pressure and/or shear. Purple colored area (Active)  09/01/20 1530  Location: Buttocks  Location Orientation: Left  Staging: Deep Tissue Pressure Injury - Purple or maroon localized area of discolored intact skin or blood-filled blister due to damage of underlying soft tissue from pressure and/or shear.  Wound Description (Comments): Purple colored area  Present on Admission: Yes     Pressure Injury 09/09/20 Buttocks Right Stage 2 -  Partial thickness loss of dermis presenting as a shallow open injury with a red, pink wound bed without slough. (Active)  09/09/20 1400  Location: Buttocks  Location Orientation: Right  Staging: Stage 2 -  Partial thickness loss of dermis  presenting as a shallow open injury with a red, pink wound bed without slough.  Wound Description (Comments):   Present on Admission: No    Physical Exam: Vital Signs Blood pressure 124/90, pulse 77, temperature 98.5 F (36.9 C), resp. rate 19, height 5' 10" (1.778 m), weight 69.6 kg, SpO2 98 %. Constitutional: No distress . Vital signs reviewed. HEENT: NCAT, EOMI, oral membranes moist Neck: supple Cardiovascular: Tachcyardia   Respiratory/Chest: CTA Bilaterally without wheezes or rales. Normal effort    GI/Abdomen: BS +, non-tender, non-distended, ostomy site intact, wounds healing well     Ext: Right knee more tender to palpation today Psych: pleasant and cooperative  Skin: intact Neuro:    Mental Status: He is alert and oriented to person, place, and time. MSK: knee and ankle joints are  less tender to palpation.  Swelling has decreased. Improved range of motion- 80 degrees right knee flexion   Assessment/Plan: 1. Functional deficits which require 3+ hours per day of interdisciplinary therapy in a comprehensive inpatient rehab setting. Physiatrist is providing close team supervision and 24 hour management of active medical problems listed below. Physiatrist and rehab team continue to assess barriers to discharge/monitor patient progress toward functional and medical goals  Care Tool:  Bathing    Body parts  bathed by patient: Right arm, Left arm, Chest, Abdomen, Front perineal area, Right upper leg, Left upper leg, Face   Body parts bathed by helper: Right lower leg, Left lower leg, Buttocks     Bathing assist Assist Level: Supervision/Verbal cueing     Upper Body Dressing/Undressing Upper body dressing   What is the patient wearing?: Pull over shirt    Upper body assist Assist Level: Independent with assistive device    Lower Body Dressing/Undressing Lower body dressing      What is the patient wearing?: Pants     Lower body assist Assist for lower body  dressing: Contact Guard/Touching assist     Toileting Toileting    Toileting assist Assist for toileting: Set up assist Assistive Device Comment: Urinal   Transfers Chair/bed transfer  Transfers assist     Chair/bed transfer assist level: Contact Guard/Touching assist     Locomotion Ambulation   Ambulation assist      Assist level: Minimal Assistance - Patient > 75% Assistive device: Walker-rolling Max distance: 55f   Walk 10 feet activity   Assist     Assist level: Minimal Assistance - Patient > 75% Assistive device: Walker-rolling   Walk 50 feet activity   Assist Walk 50 feet with 2 turns activity did not occur: Safety/medical concerns         Walk 150 feet activity   Assist Walk 150 feet activity did not occur: Safety/medical concerns         Walk 10 feet on uneven surface  activity   Assist Walk 10 feet on uneven surfaces activity did not occur: Safety/medical concerns         Wheelchair     Assist Will patient use wheelchair at discharge?: Yes Type of Wheelchair: Manual    Wheelchair assist level: Supervision/Verbal cueing Max wheelchair distance: 1241f   Wheelchair 50 feet with 2 turns activity    Assist        Assist Level: Supervision/Verbal cueing   Wheelchair 150 feet activity     Assist      Assist Level: Maximal Assistance - Patient 25 - 49%   Blood pressure 124/90, pulse 77, temperature 98.5 F (36.9 C), resp. rate 19, height 5' 10" (1.778 m), weight 69.6 kg, SpO2 98 %.  Medical Problem List and Plan: 1.  Debility             -patient may shower but wound must be covered             -ELOS/Goals: 2-3 weeks MinA         Continue CIR  -right knee pain limiting therapy, but he is trying his best.  2.  L-gastroc DVT: continue Eliquis. No thrombus seen on dopplers 8/6. There are swollen groin LN bilaterally- discussed that this could be a response to infection.              -antiplatelet therapy:  N/A 3. Pain from bilateral lower extremity swelling:  Continue Oxycodone prn- currently requiring 1564mue to pain from swelling --IV dilaudid prior to dressing changes BID --Robaxin 1000 mg tid Ice/elevation Cr stable on Lasix- continue low dose supplementation. See #11 Added voltaren gel scheduled for right knee Provided list of foods that can help with pain.  Advised patient to try using oxycodone 87m14m minutes prior to therapy sessions for maximal benefit.  4. Mood: LCSW to follow for evaluation and support.             -  antipsychotic agents: N/AA 5. Neuropsych: This patient is capable of making decisions on his own behalf. 6. Abdominal incisions (3) and colostomy: Damp to dry dressing changes twice daily. --IV Dilaudid prior to dressing change. Advised patient to try to use oxycodone 76m instead of IV dilaudid since he will not have IV dilaudid at home. Please initiate education of wife on dressing changes.  7. Fluids/Electrolytes/Nutrition: Monitor intake/output.   -Continue Juven twice daily.   --Continue vitamin C and zinc to help promote wound healing.  8. Wound infection: Has completed 9. Hyponatremia: Continue 1800 cc/FR- Stable around 130. Admission CMP stable- discussed with patient.  10. Acute blood loss anemia: Monitor with serial checks.             --CBC reviewed and stable 11.  Polyarthralgias with effusion: Continue Celebrex daily.  Good results from ortho aspiration. Continue Lasix 280mdaily. Discussed with patient that ESR and CRP are trending downward.  -Cr and K+ stable on Lasix 2056maily -discussed results of synovial fluid sample- WBC improving, intracelluar synovial fluid + for monosodium urate crystals consistent with gout- will discuss with him 12. New diagnosis T2DM: Hgb A1C-7.4.              --monitor BS ac/hs and use SSI for elevated BS.             --consulted RD for dietary education.  --provided with list of foods that are good for diabetes.   -d/c  Boost since and use Ensure Max instead.   -educated that control has been better, discussed avoidance of juice. Educated that recent spike is due to steroids.     LOS: 10 days A FACE TO FACE EVALUATION WAS PERFORMED  KruIzora Ribas12/2022, 8:48 AM

## 2020-09-11 NOTE — Progress Notes (Signed)
Pt called for this nurse to change ostomy. Stool from ostomy soiled pt abdominal dressings. Right side abdominal dressing changed without complaints of pain. Pt requested pain medication for left side. Went to go get dilaudid but therapy was ready for pt. Asked pt to change his dressing without medication, pt frustrated but in agreement. Second nurse in with this nurse to assist dressing change. Dressing change completed without grimacing or complaints of pain. Asked pt about pain and pt stated "it doesn't matter". Pt would not give pain scale. Approached pt in hallway and pt said his pain is in his knee at a level 5. Pt medicated, see MAR. PA Pam notified. Message left for MD Raulker. Sheela Stack, LPN

## 2020-09-12 DIAGNOSIS — L7682 Other postprocedural complications of skin and subcutaneous tissue: Secondary | ICD-10-CM

## 2020-09-12 DIAGNOSIS — D62 Acute posthemorrhagic anemia: Secondary | ICD-10-CM

## 2020-09-12 DIAGNOSIS — E119 Type 2 diabetes mellitus without complications: Secondary | ICD-10-CM

## 2020-09-12 DIAGNOSIS — M10061 Idiopathic gout, right knee: Secondary | ICD-10-CM

## 2020-09-12 LAB — GLUCOSE, CAPILLARY
Glucose-Capillary: 142 mg/dL — ABNORMAL HIGH (ref 70–99)
Glucose-Capillary: 101 mg/dL — ABNORMAL HIGH (ref 70–99)
Glucose-Capillary: 140 mg/dL — ABNORMAL HIGH (ref 70–99)
Glucose-Capillary: 148 mg/dL — ABNORMAL HIGH (ref 70–99)

## 2020-09-12 LAB — BODY FLUID CULTURE W GRAM STAIN: Culture: NO GROWTH

## 2020-09-12 MED ORDER — COLCHICINE 0.6 MG PO TABS
0.6000 mg | ORAL_TABLET | Freq: Every day | ORAL | Status: DC
  Administered 2020-09-12 – 2020-09-14 (×3): 0.6 mg via ORAL
  Filled 2020-09-12 (×4): qty 1

## 2020-09-12 NOTE — Progress Notes (Signed)
PROGRESS NOTE   Subjective/Complaints: Patient seen laying in bed this morning.  He states he slept well overnight.  He denies complaints.  ROS: Denies CP, SOB, N/V/D  Objective:   No results found. No results for input(s): WBC, HGB, HCT, PLT in the last 72 hours.   No results for input(s): NA, K, CL, CO2, GLUCOSE, BUN, CREATININE, CALCIUM in the last 72 hours.   Intake/Output Summary (Last 24 hours) at 09/12/2020 1038 Last data filed at 09/12/2020 0600 Gross per 24 hour  Intake 540 ml  Output 2600 ml  Net -2060 ml      Pressure Injury 09/01/20 Buttocks Left Deep Tissue Pressure Injury - Purple or maroon localized area of discolored intact skin or blood-filled blister due to damage of underlying soft tissue from pressure and/or shear. Purple colored area (Active)  09/01/20 1530  Location: Buttocks  Location Orientation: Left  Staging: Deep Tissue Pressure Injury - Purple or maroon localized area of discolored intact skin or blood-filled blister due to damage of underlying soft tissue from pressure and/or shear.  Wound Description (Comments): Purple colored area  Present on Admission: Yes     Pressure Injury 09/09/20 Buttocks Right Stage 2 -  Partial thickness loss of dermis presenting as a shallow open injury with a red, pink wound bed without slough. (Active)  09/09/20 1400  Location: Buttocks  Location Orientation: Right  Staging: Stage 2 -  Partial thickness loss of dermis presenting as a shallow open injury with a red, pink wound bed without slough.  Wound Description (Comments):   Present on Admission: No    Physical Exam: Vital Signs Blood pressure 126/88, pulse 82, temperature 98.5 F (36.9 C), temperature source Oral, resp. rate 18, height $RemoveBe'5\' 10"'euTZLoPLD$  (1.778 m), weight 69.6 kg, SpO2 98 %. Constitutional: No distress . Vital signs reviewed. HENT: Normocephalic.  Atraumatic. Eyes: EOMI. No  discharge. Cardiovascular: No JVD.  RRR. Respiratory: Normal effort.  No stridor.  Bilateral clear to auscultation. GI: Non-distended.  BS +.  + Ostomy. Skin: Warm and dry.  Intact. Psych: Normal mood.  Normal behavior. Musc: No edema in extremities.  No tenderness in extremities. Neuro: Alert and oriented Motor: Grossly 4-4+/5 throughout  Assessment/Plan: 1. Functional deficits which require 3+ hours per day of interdisciplinary therapy in a comprehensive inpatient rehab setting. Physiatrist is providing close team supervision and 24 hour management of active medical problems listed below. Physiatrist and rehab team continue to assess barriers to discharge/monitor patient progress toward functional and medical goals  Care Tool:  Bathing    Body parts bathed by patient: Right arm, Left arm, Chest, Abdomen, Front perineal area, Right upper leg, Left upper leg, Face   Body parts bathed by helper: Right lower leg, Left lower leg, Buttocks     Bathing assist Assist Level: Supervision/Verbal cueing     Upper Body Dressing/Undressing Upper body dressing   What is the patient wearing?: Pull over shirt    Upper body assist Assist Level: Independent with assistive device    Lower Body Dressing/Undressing Lower body dressing      What is the patient wearing?: Pants     Lower body assist Assist for lower body  dressing: Contact Guard/Touching assist     Toileting Toileting    Toileting assist Assist for toileting: Set up assist Assistive Device Comment: Urinal   Transfers Chair/bed transfer  Transfers assist     Chair/bed transfer assist level: Contact Guard/Touching assist     Locomotion Ambulation   Ambulation assist      Assist level: Contact Guard/Touching assist Assistive device: Walker-rolling Max distance: 83ft   Walk 10 feet activity   Assist     Assist level: Contact Guard/Touching assist Assistive device: Walker-rolling   Walk 50 feet  activity   Assist Walk 50 feet with 2 turns activity did not occur: Safety/medical concerns  Assist level: Contact Guard/Touching assist Assistive device: Walker-rolling    Walk 150 feet activity   Assist Walk 150 feet activity did not occur: Safety/medical concerns         Walk 10 feet on uneven surface  activity   Assist Walk 10 feet on uneven surfaces activity did not occur: Safety/medical concerns         Wheelchair     Assist Will patient use wheelchair at discharge?: Yes Type of Wheelchair: Manual    Wheelchair assist level: Supervision/Verbal cueing Max wheelchair distance: 157ft    Wheelchair 50 feet with 2 turns activity    Assist        Assist Level: Supervision/Verbal cueing   Wheelchair 150 feet activity     Assist      Assist Level: Maximal Assistance - Patient 25 - 49%   Blood pressure 126/88, pulse 82, temperature 98.5 F (36.9 C), temperature source Oral, resp. rate 18, height 5\' 10"  (1.778 m), weight 69.6 kg, SpO2 98 %.  Medical Problem List and Plan: 1.  Debility Continue CIR  2.  L-gastroc DVT: continue Eliquis. No thrombus seen on dopplers 8/6. There are swollen groin LN bilaterally- this could be a response to infection.              -antiplatelet therapy: N/A 3. Pain from bilateral lower extremity swelling:  Continue Oxycodone prn- currently requiring 15mg  due to pain from swelling --IV dilaudid prior to dressing changes BID --Robaxin 1000 mg tid Ice/elevation Cr stable on Lasix- continue low dose supplementation. See #11 Added voltaren gel scheduled for right knee List of foods that can help with pain.  Advised patient to try using oxycodone 15mg  30 minutes prior to therapy sessions for maximal benefit.  Relatively controlled on 8/13 4. Mood: LCSW to follow for evaluation and support.             -antipsychotic agents: N/AA 5. Neuropsych: This patient is capable of making decisions on his own behalf. 6.  Abdominal incisions (3) and colostomy: Damp to dry dressing changes twice daily. --IV Dilaudid prior to dressing change. Advised patient to try to use oxycodone 15mg  instead of IV dilaudid since he will not have IV dilaudid at home. Please initiate education of wife on dressing changes.  7. Fluids/Electrolytes/Nutrition: Monitor intake/output.   -Continue Juven twice daily.   --Continue vitamin C and zinc to help promote wound healing.  8. Wound infection: Has completed 9. Hyponatremia: Continue 1800 cc/FR- Stable around 130. Admission CMP stable- discussed with patient.  10. Acute blood loss anemia: Monitor with serial checks.             Hemoglobin 9.0 on 8/8, labs ordered for Monday 11.  Polyarthralgias, gout with effusion: Continue Celebrex daily.  Good results from ortho aspiration. Continue Lasix 20mg  daily. Discussed with patient  that ESR and CRP are trending downward.  -Cr and K+ stable on Lasix $Remove'20mg'aHToDpW$  daily Colchicine initiated on 8/13 12. New diagnosis T2DM: Hgb A1C-7.4.              --monitor BS ac/hs and use SSI for elevated BS.             --consulted RD for dietary education.  --provided with list of foods that are good for diabetes.   -d/c Boost since and use Ensure Max instead.   -educated that control has been better, discussed avoidance of juice. Educated that recent spike is due to steroids.   Mildly elevated on 8/13   LOS: 11 days A FACE TO FACE EVALUATION WAS PERFORMED  Adyline Huberty Lorie Phenix 09/12/2020, 10:38 AM

## 2020-09-12 NOTE — Progress Notes (Signed)
Physical Therapy Session Note  Patient Details  Name: Raju Coppolino MRN: [id number] Date of Birth: [birth year]  Today's Date: 09/12/2020 PT Individual Time: 1100-1153 PT Individual Time Calculation (min): 53 min   Short Term Goals: Week 1:  PT Short Term Goal 1 (Week 1): Pt will perform STS ModA PT Short Term Goal 1 - Progress (Week 1): Met PT Short Term Goal 2 (Week 1): Pt will ambulate 25 ft MinA PT Short Term Goal 2 - Progress (Week 1): Met PT Short Term Goal 3 (Week 1): Pt will initiate stair training PT Short Term Goal 3 - Progress (Week 1): Progressing toward goal PT Short Term Goal 4 (Week 1): Pt will improve RLE ROM to 75+ flexion PT Short Term Goal 4 - Progress (Week 1): Progressing toward goal Week 2:  PT Short Term Goal 1 (Week 2): pt will perform sit<>stand transfers with min A consistantly PT Short Term Goal 2 (Week 2): Pt will initiate stair training PT Short Term Goal 3 (Week 2): Pt will improve RLE ROM to 75+ flexion  Skilled Therapeutic Interventions/Progress Updates:   Received pt semi-reclined in bed, pt agreeable to PT treatment, and denied any pain during session but reported recently receiving pain medication. Session with emphasis on functional mobility, generalized strengthening and R knee ROM, dynamic standing balance/coordination, gait training, stair navigation, and improved activity tolerance. Pt performed bed mobility with HOB elevated and supervision and donned shoes with max A for time management purposes. Sit<>stands with RW and CGA throughout session and pt ambulated 138f x 2 trials with RW and CGA. Pt demonstrates improvements in gait speed, bilateral foot clearance, and R knee flexion/extension ROM. Pt then navigated 8 3in steps with bilateral rails and CGA ascending and descending with a step to pattern. In dayroom, pt performed BUE/LE strengthening on Nustep at workload 6 for 12 minutes for a total of 308 steps for improved cardiovascular endurance and  reciprocal movement training with emphasis on R knee flexion ROM. Pt reported having "no energy" from not eating breakfast secondary to not liking food options and  [facility]. Concluded session with pt sitting in WC, needs within reach, and chair pad alarm on.   Therapy Documentation Precautions:  Precautions Precautions: Fall Precaution Comments: colostomy, bandages over full abdomen, R knee ROM limitations Restrictions Weight Bearing Restrictions: No  Therapy/Group: Individual Therapy ARead BonelliPT, DPT   09/12/2020, 7:31 AM

## 2020-09-12 NOTE — Progress Notes (Signed)
Pt emptied colostomy by self. Pt had no further questions. Wife at bedside. Call light in reach. Needs met. Sheela Stack, LPN

## 2020-09-12 NOTE — Progress Notes (Addendum)
Pt states family will be helping with dressing changes at home. Encouraged pt to have family member come in to observe dressing change as ordered, pt declined and stated "they are in the business". Wife concerned about need for home health.Message left for SW Sheela Stack, LPN

## 2020-09-13 DIAGNOSIS — E871 Hypo-osmolality and hyponatremia: Secondary | ICD-10-CM

## 2020-09-13 LAB — GLUCOSE, CAPILLARY
Glucose-Capillary: 146 mg/dL — ABNORMAL HIGH (ref 70–99)
Glucose-Capillary: 133 mg/dL — ABNORMAL HIGH (ref 70–99)
Glucose-Capillary: 145 mg/dL — ABNORMAL HIGH (ref 70–99)
Glucose-Capillary: 169 mg/dL — ABNORMAL HIGH (ref 70–99)

## 2020-09-13 NOTE — Progress Notes (Signed)
Pt tolerated dressing changed. Pt pre-medicated with 15mg  of oxy. No complaints of pain during dressing change. Call light in reach, needs met. Sheela Stack, LPN

## 2020-09-13 NOTE — Progress Notes (Signed)
Occupational Therapy Session Note  Patient Details  Name: Roger Brennan MRN: [id number] Date of Birth: 03/20/1960  Today's Date: 09/13/2020 OT Individual Time: 6945-0388 OT Individual Time Calculation (min): 75 min    Short Term Goals: Week 1:  OT Short Term Goal 1 (Week 1): Pt will use LRAD to don LB clothing with mod A OT Short Term Goal 1 - Progress (Week 1): Progressing toward goal OT Short Term Goal 2 (Week 1): Pt will complete sit > stand with mod A OT Short Term Goal 2 - Progress (Week 1): Met OT Short Term Goal 3 (Week 1): Pt will complete stand pivot transfer with mod A OT Short Term Goal 3 - Progress (Week 1): Met Week 2:  OT Short Term Goal 1 (Week 2): Pt will complete LB dressing with mod assist with AE as needed OT Short Term Goal 2 (Week 2): Pt will complete toilet transfer with min assist OT Short Term Goal 3 (Week 2): Pt will complete 2 grooming tasks in standing to demonstrate increased standing tolerance   Skilled Therapeutic Interventions/Progress Updates:    Pt greeted at time of session semireclined in bed resting with RN finishing med pass. No reports of pain throughout. Focus of session initially on bathing tasks at sink level with Supervision, pt declined periarea and buttocks and bathing LB from a seated position in wheelchair. Changed shirt Mod I retrieving from drawer wheelchair level. Pt requesting to wash hair, therapist assist with hair washing sink level with board and pt grooming/drying with Mod I. Pt performed functional mobility in room Supervision/CGA with RW and in hall for 50" followed by another 50" in to gym area. In gym, focused on RLE ROM and strengthening to improve ADL skill performance with 3# ankle weight donned pt performing knee extensions seated and standing hip abduction and high knees, all for 2x10 each to tolerance. Self propel back to room Supervision and stand pivot to bed same manner. Note throughout session continued discussion for DC  planning and OT goals. Alarm on call bell in reach.   Therapy Documentation Precautions:  Precautions Precautions: Fall Precaution Comments: colostomy, bandages over full abdomen, R knee ROM limitations Restrictions Weight Bearing Restrictions: No    Therapy/Group: Individual Therapy  Viona Gilmore 09/13/2020, 7:17 AM

## 2020-09-13 NOTE — Progress Notes (Signed)
Pt right side wound bed has white/yellow appearance. Wound shown to MD Patel on rounds this AM. No new orders at this time. Sheela Stack, LPN

## 2020-09-13 NOTE — Progress Notes (Signed)
PROGRESS NOTE   Subjective/Complaints: Patient seen laying in bed this morning.  He states he slept well overnight.  He is questions regarding medications.  Assessed wounds with nursing.  ROS: Denies CP, SOB, N/V/D  Objective:   No results found. No results for input(s): WBC, HGB, HCT, PLT in the last 72 hours.   No results for input(s): NA, K, CL, CO2, GLUCOSE, BUN, CREATININE, CALCIUM in the last 72 hours.   Intake/Output Summary (Last 24 hours) at 09/13/2020 0809 Last data filed at 09/13/2020 0735 Gross per 24 hour  Intake 940 ml  Output 1950 ml  Net -1010 ml      Pressure Injury 09/01/20 Buttocks Left Deep Tissue Pressure Injury - Purple or maroon localized area of discolored intact skin or blood-filled blister due to damage of underlying soft tissue from pressure and/or shear. Purple colored area (Active)  09/01/20 1530  Location: Buttocks  Location Orientation: Left  Staging: Deep Tissue Pressure Injury - Purple or maroon localized area of discolored intact skin or blood-filled blister due to damage of underlying soft tissue from pressure and/or shear.  Wound Description (Comments): Purple colored area  Present on Admission: Yes     Pressure Injury 09/09/20 Buttocks Right Stage 2 -  Partial thickness loss of dermis presenting as a shallow open injury with a red, pink wound bed without slough. (Active)  09/09/20 1400  Location: Buttocks  Location Orientation: Right  Staging: Stage 2 -  Partial thickness loss of dermis presenting as a shallow open injury with a red, pink wound bed without slough.  Wound Description (Comments):   Present on Admission: No    Physical Exam: Vital Signs Blood pressure 132/87, pulse 79, temperature 98.4 F (36.9 C), temperature source Oral, resp. rate 16, height $RemoveBe'5\' 10"'OEckBNYHE$  (1.778 m), weight 67.1 kg, SpO2 98 %. Constitutional: No distress . Vital signs reviewed. HENT: Normocephalic.   Atraumatic. Eyes: EOMI. No discharge. Cardiovascular: No JVD.  RRR. Respiratory: Normal effort.  No stridor.  Bilateral clear to auscultation. GI: Non-distended.  BS +.  + Ostomy. Skin: Warm and dry.  Bilateral abdominal wound with good granulation tissue, some fibrinous tissue noted in distal right wound bed. Psych: Normal mood.  Normal behavior. Musc: No edema in extremities.  No tenderness in extremities. Neuro: Alert and oriented Motor: Grossly 4-4+/5 throughout  Assessment/Plan: 1. Functional deficits which require 3+ hours per day of interdisciplinary therapy in a comprehensive inpatient rehab setting. Physiatrist is providing close team supervision and 24 hour management of active medical problems listed below. Physiatrist and rehab team continue to assess barriers to discharge/monitor patient progress toward functional and medical goals  Care Tool:  Bathing    Body parts bathed by patient: Right arm, Left arm, Chest, Abdomen, Front perineal area, Right upper leg, Left upper leg, Face   Body parts bathed by helper: Right lower leg, Left lower leg, Buttocks     Bathing assist Assist Level: Supervision/Verbal cueing     Upper Body Dressing/Undressing Upper body dressing   What is the patient wearing?: Pull over shirt    Upper body assist Assist Level: Independent with assistive device    Lower Body Dressing/Undressing Lower body dressing  What is the patient wearing?: Pants     Lower body assist Assist for lower body dressing: Contact Guard/Touching assist     Toileting Toileting    Toileting assist Assist for toileting: Set up assist Assistive Device Comment: Urinal   Transfers Chair/bed transfer  Transfers assist     Chair/bed transfer assist level: Contact Guard/Touching assist     Locomotion Ambulation   Ambulation assist      Assist level: Contact Guard/Touching assist Assistive device: Walker-rolling Max distance: 128ft   Walk 10  feet activity   Assist     Assist level: Contact Guard/Touching assist Assistive device: Walker-rolling   Walk 50 feet activity   Assist Walk 50 feet with 2 turns activity did not occur: Safety/medical concerns  Assist level: Contact Guard/Touching assist Assistive device: Walker-rolling    Walk 150 feet activity   Assist Walk 150 feet activity did not occur: Safety/medical concerns         Walk 10 feet on uneven surface  activity   Assist Walk 10 feet on uneven surfaces activity did not occur: Safety/medical concerns         Wheelchair     Assist Will patient use wheelchair at discharge?: Yes Type of Wheelchair: Manual    Wheelchair assist level: Supervision/Verbal cueing Max wheelchair distance: 148ft    Wheelchair 50 feet with 2 turns activity    Assist        Assist Level: Supervision/Verbal cueing   Wheelchair 150 feet activity     Assist      Assist Level: Maximal Assistance - Patient 25 - 49%   Blood pressure 132/87, pulse 79, temperature 98.4 F (36.9 C), temperature source Oral, resp. rate 16, height 5\' 10"  (1.778 m), weight 67.1 kg, SpO2 98 %.  Medical Problem List and Plan: 1.  Debility Continue CIR  2.  L-gastroc DVT: continue Eliquis. No thrombus seen on dopplers 8/6. There are swollen groin LN bilaterally- this could be a response to infection.              -antiplatelet therapy: N/A 3. Pain from bilateral lower extremity swelling:  Continue Oxycodone prn- currently requiring 15mg  due to pain from swelling --IV dilaudid prior to dressing changes BID --Robaxin 1000 mg tid Ice/elevation Cr stable on Lasix- continue low dose supplementation. See #11 Added voltaren gel scheduled for right knee List of foods that can help with pain.  Advised patient to try using oxycodone 15mg  30 minutes prior to therapy sessions for maximal benefit.  Controlled on 8/14 4. Mood: LCSW to follow for evaluation and support.              -antipsychotic agents: N/AA 5. Neuropsych: This patient is capable of making decisions on his own behalf. 6. Abdominal incisions (3) and colostomy: Damp to dry dressing changes twice daily. --IV Dilaudid prior to dressing change. Advised patient to try to use oxycodone 15mg  instead of IV dilaudid since he will not have IV dilaudid at home. Please initiate education of wife on dressing changes.  7. Fluids/Electrolytes/Nutrition: Monitor intake/output.   -Continue Juven twice daily.   --Continue vitamin C and zinc to help promote wound healing.  8. Wound infection: Has completed 9. Hyponatremia: Continue 1800 cc/FR  Sodium 129 on 8/10, labs ordered for tomorrow 10. Acute blood loss anemia: Monitor with serial checks.             Hemoglobin 9.0 on 8/8, labs ordered for tomorrow 11.  Polyarthralgias, gout with effusion: Continue  Celebrex daily.  Good results from ortho aspiration. Continue Lasix $RemoveBefore'20mg'ICTkJkEjuwTkX$  daily. Discussed with patient that ESR and CRP are trending downward.  -Cr and K+ stable on Lasix $Remove'20mg'nkzVpIl$  daily Colchicine initiated on 8/13, discussed with patient 12. New diagnosis T2DM: Hgb A1C-7.4.              --monitor BS ac/hs and use SSI for elevated BS.             --consulted RD for dietary education.  --provided with list of foods that are good for diabetes.   -d/c Boost since and use Ensure Max instead.   -educated that control has been better, discussed avoidance of juice. Educated that recent spike is due to steroids.   Appears to be stabilizing and elevated, will consider medication initiation if persistent  LOS: 12 days A FACE TO FACE EVALUATION WAS PERFORMED  Erin Obando Lorie Phenix 09/13/2020, 8:09 AM

## 2020-09-14 LAB — BASIC METABOLIC PANEL
Anion gap: 7 (ref 5–15)
BUN: 27 mg/dL — ABNORMAL HIGH (ref 6–20)
CO2: 27 mmol/L (ref 22–32)
Calcium: 9.7 mg/dL (ref 8.9–10.3)
Chloride: 99 mmol/L (ref 98–111)
Creatinine, Ser: 0.7 mg/dL (ref 0.61–1.24)
GFR, Estimated: >60 mL/min (ref >60)
Glucose, Bld: 220 mg/dL — ABNORMAL HIGH (ref 70–99)
Potassium: 4.1 mmol/L (ref 3.5–5.1)
Sodium: 133 mmol/L — ABNORMAL LOW (ref 135–145)

## 2020-09-14 LAB — CBC
HCT: 33.4 % — ABNORMAL LOW (ref 39.0–52.0)
Hemoglobin: 10.6 g/dL — ABNORMAL LOW (ref 13.0–17.0)
MCH: 28.6 pg (ref 26.0–34.0)
MCHC: 31.7 g/dL (ref 30.0–36.0)
MCV: 90.3 fL (ref 80.0–100.0)
Platelets: 511 10*3/uL — ABNORMAL HIGH (ref 150–400)
RBC: 3.7 MIL/uL — ABNORMAL LOW (ref 4.22–5.81)
RDW: 15.6 % — ABNORMAL HIGH (ref 11.5–15.5)
WBC: 7.8 10*3/uL (ref 4.0–10.5)
nRBC: 0 % (ref 0.0–0.2)

## 2020-09-14 LAB — GLUCOSE, CAPILLARY
Glucose-Capillary: 150 mg/dL — ABNORMAL HIGH (ref 70–99)
Glucose-Capillary: 133 mg/dL — ABNORMAL HIGH (ref 70–99)
Glucose-Capillary: 132 mg/dL — ABNORMAL HIGH (ref 70–99)
Glucose-Capillary: 164 mg/dL — ABNORMAL HIGH (ref 70–99)

## 2020-09-14 MED ORDER — FUROSEMIDE 20 MG PO TABS
10.0000 mg | ORAL_TABLET | Freq: Every day | ORAL | Status: DC
  Administered 2020-09-15: 10 mg via ORAL
  Filled 2020-09-14: qty 1

## 2020-09-14 NOTE — Progress Notes (Signed)
Occupational Therapy Session Note  Patient Details  Name: Roger Brennan MRN: [id number] Date of Birth: 07-28-1960  Today's Date: 09/14/2020 OT Individual Time: 9688-6484 and 7207-2182 OT Individual Time Calculation (min): 69 min and 36 min   Short Term Goals: Week 1:  OT Short Term Goal 1 (Week 1): Pt will use LRAD to don LB clothing with mod A OT Short Term Goal 1 - Progress (Week 1): Progressing toward goal OT Short Term Goal 2 (Week 1): Pt will complete sit > stand with mod A OT Short Term Goal 2 - Progress (Week 1): Met OT Short Term Goal 3 (Week 1): Pt will complete stand pivot transfer with mod A OT Short Term Goal 3 - Progress (Week 1): Met  Skilled Therapeutic Interventions/Progress Updates:    1) Treatment session with focus on self-care retraining, functional mobility, and overall strength and conditioning.  Pt received supine in bed agreeable to therapy session.  Pt completed bed mobility supervision and stood from standard bed height with RW with CGA.  Pt donned shoes with setup and then ambulated to sink with RW to complete grooming tasks in standing.  Pt changed clothing with Mod I for shirt as pt able to retrieve it independently with RW and then supervision when donning new shorts.  Pt ambulated 100' with RW with one standing rest break midway and CGA.  Engaged in [place] while standing on wedge to challenge balance and increase overall strength.  Utilized 2 kg ball to complete chest and overhead presses and PNF pattern diagonals 2 sets of 10 each while standing on wedge.  Discussed typical recovery time as pt reports frustration with decreased strength and endurance.  Pt required seated rest breaks between each exercise.  Engaged in alternating toe taps to cone with 3# ankle weights for strength and endurance as needed for self-care tasks and to return to work.  Pt ambulated back to room with RW CGA, again with one standing rest break ~50'.  Pt remained seated upright in  w/c with chair alarm on and all needs in reach. MD in room.  2) Treatment session with focus on functional mobility, BLE strengthening as needed for endurance and dynamic balance for ADLs and IADLs.  Pt received seated EOB with wife present.  Pt completed sit > stand with supervision from EOB.  Pt ambulated >250' with RW with CGA - close supervision and intermittent standing rest breaks.  Pt completed 1.5 min x2 and 1 min on Kinetron in standing at 30 cm/min.  Pt required seated rest break between each.  Pt completed obstacle course with CGA with RW incorporating stepping over and navigating around items. Pt requiring min cues for safety due to quick movements throughout obstacle course.  Pt ambulated 100' with RW with CGA and then propelled self in w/c to room with BUE and BLE for strengthening.  Pt remained seated EOB with all needs in reach.  Therapy Documentation Precautions:  Precautions Precautions: Fall Precaution Comments: colostomy, bandages over full abdomen, R knee ROM limitations Restrictions Weight Bearing Restrictions: No  Pain: Pain Assessment Pain Score: 6    Therapy/Group: Individual Therapy  Simonne Come 09/14/2020, 8:38 AM

## 2020-09-14 NOTE — Progress Notes (Signed)
Physical Therapy Session Note  Patient Details  Name: Roger Brennan MRN: [id number] Date of Birth: 1960/10/16  Today's Date: 09/14/2020 PT Individual Time: [id number] PT Individual Time Calculation (min): 44 min   Short Term Goals: Week 2:  PT Short Term Goal 1 (Week 2): pt will perform sit<>stand transfers with min A consistantly PT Short Term Goal 2 (Week 2): Pt will initiate stair training PT Short Term Goal 3 (Week 2): Pt will improve RLE ROM to 75+ flexion  Skilled Therapeutic Interventions/Progress Updates:    Session1:Patient in supine and reports very little pain.  Supine to sit with supervision and donned shoes at EOB with set up.  Patient reported needing to urinate.  Performed toileting seated at EOB with set up.  Patient transferred to w/c with S and propelled with feet x 140' with S.  Patient sit to stand to RW with CGA and ambulated 100' with RW and S to CGA.  Negotiated 8 (3") steps with rails and CGA then 4 (6") steps with rails and CGA cues for sequencing. Patient ambulated another 73' with RW and S to CGA with cues for posture.  Patient seated for core strength pushing out weighted ball from center and back 2 x 10 reps, then shoulder to opposite hip then hip to hip all x 10.  Patient ambulated through obstacle course with RW stepping on, over and around obstacles with CGA.  Patient propelled w/c with feet back to room with S x 140'.  Transferred to bed with S using rail and to supine with S.  Left with call bell and needs in reach.  [place]: Missed 45 minutes skilled PT due to out of town visitor and pt request.   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

## 2020-09-14 NOTE — Plan of Care (Signed)
  Problem: RH Balance Goal: LTG Patient will maintain dynamic standing with ADLs (OT) Description: LTG:  Patient will maintain dynamic standing balance with assist during activities of daily living (OT)  Flowsheets (Taken 09/14/2020 1510) LTG: Pt will maintain dynamic standing balance during ADLs with: Independent with assistive device Note: Upgraded due to increased mobility and decreased pain   Problem: Sit to Stand Goal: LTG:  Patient will perform sit to stand in prep for activites of daily living with assistance level (OT) Description: LTG:  Patient will perform sit to stand in prep for activites of daily living with assistance level (OT) Flowsheets (Taken 09/14/2020 1510) LTG: PT will perform sit to stand in prep for activites of daily living with assistance level: Independent with assistive device Note: Upgraded due to increased mobility and decreased pain   Problem: RH Dressing Goal: LTG Patient will perform lower body dressing w/assist (OT) Description: LTG: Patient will perform lower body dressing with assist, with/without cues in positioning using equipment (OT) Flowsheets (Taken 09/14/2020 1510) LTG: Pt will perform lower body dressing with assistance level of: Independent with assistive device Note: Upgraded due to increased mobility and decreased pain   Problem: RH Toilet Transfers Goal: LTG Patient will perform toilet transfers w/assist (OT) Description: LTG: Patient will perform toilet transfers with assist, with/without cues using equipment (OT) Flowsheets (Taken 09/14/2020 1510) LTG: Pt will perform toilet transfers with assistance level of: Independent with assistive device Note: Upgraded due to increased mobility and decreased pain

## 2020-09-14 NOTE — Progress Notes (Signed)
PROGRESS NOTE   Subjective/Complaints: Left knee pain and swelling have improved Labs stable today Discussed stopping his gout medicine since pain and swelling had improved before it was started.   ROS: Denies CP, SOB, N/V/D, +left knee pain  Objective:   No results found. Recent Labs    09/14/20 0734  WBC 7.8  HGB 10.6*  HCT 33.4*  PLT 511*     Recent Labs    09/14/20 0734  NA 133*  K 4.1  CL 99  CO2 27  GLUCOSE 220*  BUN 27*  CREATININE 0.70  CALCIUM 9.7    Intake/Output Summary (Last 24 hours) at 09/14/2020 1448 Last data filed at 09/14/2020 0700 Gross per 24 hour  Intake 816 ml  Output 1750 ml  Net -934 ml     Pressure Injury 09/01/20 Buttocks Left Deep Tissue Pressure Injury - Purple or maroon localized area of discolored intact skin or blood-filled blister due to damage of underlying soft tissue from pressure and/or shear. Purple colored area (Active)  09/01/20 1530  Location: Buttocks  Location Orientation: Left  Staging: Deep Tissue Pressure Injury - Purple or maroon localized area of discolored intact skin or blood-filled blister due to damage of underlying soft tissue from pressure and/or shear.  Wound Description (Comments): Purple colored area  Present on Admission: Yes     Pressure Injury 09/09/20 Buttocks Right Stage 2 -  Partial thickness loss of dermis presenting as a shallow open injury with a red, pink wound bed without slough. (Active)  09/09/20 1400  Location: Buttocks  Location Orientation: Right  Staging: Stage 2 -  Partial thickness loss of dermis presenting as a shallow open injury with a red, pink wound bed without slough.  Wound Description (Comments):   Present on Admission: No    Physical Exam: Vital Signs Blood pressure 118/83, pulse (!) 101, temperature 98 F (36.7 C), temperature source Oral, resp. rate 17, height $RemoveBe'5\' 10"'vlOYTOWua$  (1.778 m), weight 65 kg, SpO2 100 %. Gen: no  distress, normal appearing HEENT: oral mucosa pink and moist, NCAT Cardio: Reg rate Chest: normal effort, normal rate of breathing  GI: Non-distended.  BS +.  + Ostomy. Skin: Warm and dry.  Bilateral abdominal wound with good granulation tissue, some fibrinous tissue noted in distal right wound bed. Psych: Normal mood.  Normal behavior. Musc: No edema in extremities.  No tenderness in extremities. Neuro: Alert and oriented Motor: Grossly 4-4+/5 throughout  Assessment/Plan: 1. Functional deficits which require 3+ hours per day of interdisciplinary therapy in a comprehensive inpatient rehab setting. Physiatrist is providing close team supervision and 24 hour management of active medical problems listed below. Physiatrist and rehab team continue to assess barriers to discharge/monitor patient progress toward functional and medical goals  Care Tool:  Bathing    Body parts bathed by patient: Right arm, Left arm, Chest, Abdomen, Right upper leg, Left upper leg, Face, Right lower leg, Left lower leg (declined buttocks/periarea today)   Body parts bathed by helper: Right lower leg, Left lower leg, Buttocks     Bathing assist Assist Level: Supervision/Verbal cueing     Upper Body Dressing/Undressing Upper body dressing   What is the patient  wearing?: Pull over shirt    Upper body assist Assist Level: Independent with assistive device    Lower Body Dressing/Undressing Lower body dressing      What is the patient wearing?: Pants     Lower body assist Assist for lower body dressing: Supervision/Verbal cueing     Toileting Toileting    Toileting assist Assist for toileting: Set up assist Assistive Device Comment: Urinal   Transfers Chair/bed transfer  Transfers assist     Chair/bed transfer assist level: Contact Guard/Touching assist     Locomotion Ambulation   Ambulation assist      Assist level: Contact Guard/Touching assist Assistive device:  Walker-rolling Max distance: 151ft   Walk 10 feet activity   Assist     Assist level: Contact Guard/Touching assist Assistive device: Walker-rolling   Walk 50 feet activity   Assist Walk 50 feet with 2 turns activity did not occur: Safety/medical concerns  Assist level: Contact Guard/Touching assist Assistive device: Walker-rolling    Walk 150 feet activity   Assist Walk 150 feet activity did not occur: Safety/medical concerns         Walk 10 feet on uneven surface  activity   Assist Walk 10 feet on uneven surfaces activity did not occur: Safety/medical concerns         Wheelchair     Assist Will patient use wheelchair at discharge?: Yes Type of Wheelchair: Manual    Wheelchair assist level: Supervision/Verbal cueing Max wheelchair distance: 155ft    Wheelchair 50 feet with 2 turns activity    Assist        Assist Level: Supervision/Verbal cueing   Wheelchair 150 feet activity     Assist      Assist Level: Maximal Assistance - Patient 25 - 49%   Blood pressure 118/83, pulse (!) 101, temperature 98 F (36.7 C), temperature source Oral, resp. rate 17, height 5\' 10"  (1.778 m), weight 65 kg, SpO2 100 %.  Medical Problem List and Plan: 1.  Debility Continue CIR  2.  L-gastroc DVT: continue Eliquis. No thrombus seen on dopplers 8/6. There are swollen groin LN bilaterally- this could be a response to infection.              -antiplatelet therapy: N/A 3. Pain from bilateral lower extremity swelling:  Continue Oxycodone prn- currently requiring 15mg  due to pain from swelling --IV dilaudid prior to dressing changes BID --Robaxin 1000 mg tid Ice/elevation Cr stable on Lasix- continue low dose supplementation. See #11 Added voltaren gel scheduled for right knee List of foods that can help with pain.  Advised patient to try using oxycodone 15mg  30 minutes prior to therapy sessions for maximal benefit. Discontinued dilaudid.  Controlled on  8/15 4. Mood: LCSW to follow for evaluation and support.             -antipsychotic agents: N/AA 5. Neuropsych: This patient is capable of making decisions on his own behalf. 6. Abdominal incisions (3) and colostomy: Damp to dry dressing changes twice daily. Please initiate education of wife on dressing changes.  7. Fluids/Electrolytes/Nutrition: Monitor intake/output.   -Continue Juven twice daily.   --Continue vitamin C and zinc to help promote wound healing.  8. Wound infection: Has completed 9. Hyponatremia: Continue 1800 cc/FR  Improving.  10. Acute blood loss anemia: Monitor with serial checks.             Hemoglobin 9.0 on 8/8, labs ordered for tomorrow 11.  Polyarthralgias, gout with effusion: Continue Celebrex  daily.  Good results from ortho aspiration. Continue Lasix $RemoveBefore'20mg'abdBWHTAWxUDU$  daily. Discussed with patient that ESR and CRP are trending downward.  -Cr and K+ stable, decrease Lasix to $Remove'10mg'zlaMgbH$ .  12. New diagnosis T2DM: Hgb A1C-7.4.              --monitor BS ac/hs and use SSI for elevated BS.             --consulted RD for dietary education.  --provided with list of foods that are good for diabetes.   -d/c Boost since and use Ensure Max instead.   -educated that control has been better, discussed avoidance of juice. Educated that recent spike is due to steroids.   Appears to be stabilizing and elevated, will consider medication initiation if persistent  LOS: 13 days A FACE TO FACE EVALUATION WAS PERFORMED  Roger Brennan 09/14/2020, 2:48 PM

## 2020-09-15 LAB — GLUCOSE, CAPILLARY
Glucose-Capillary: 129 mg/dL — ABNORMAL HIGH (ref 70–99)
Glucose-Capillary: 140 mg/dL — ABNORMAL HIGH (ref 70–99)
Glucose-Capillary: 179 mg/dL — ABNORMAL HIGH (ref 70–99)
Glucose-Capillary: 148 mg/dL — ABNORMAL HIGH (ref 70–99)

## 2020-09-15 NOTE — Progress Notes (Signed)
Physical Therapy Session Note  Patient Details  Name: Roger Brennan MRN: [id number] Date of Birth: 04-05-1960  Today's Date: 09/15/2020 PT Individual Time: 1000-1054 and 1761-6073 PT Individual Time Calculation (min): 54 min and 24 min  Short Term Goals: Week 1:  PT Short Term Goal 1 (Week 1): Pt will perform STS ModA PT Short Term Goal 1 - Progress (Week 1): Met PT Short Term Goal 2 (Week 1): Pt will ambulate 25 ft MinA PT Short Term Goal 2 - Progress (Week 1): Met PT Short Term Goal 3 (Week 1): Pt will initiate stair training PT Short Term Goal 3 - Progress (Week 1): Progressing toward goal PT Short Term Goal 4 (Week 1): Pt will improve RLE ROM to 75+ flexion PT Short Term Goal 4 - Progress (Week 1): Progressing toward goal Week 2:  PT Short Term Goal 1 (Week 2): pt will perform sit<>stand transfers with min A consistantly PT Short Term Goal 2 (Week 2): Pt will initiate stair training PT Short Term Goal 3 (Week 2): Pt will improve RLE ROM to 75+ flexion  Skilled Therapeutic Interventions/Progress Updates:   Treatment Session 1 Received pt semi-reclined in bed, pt agreeable to PT treatment, and did not report any pain during session. Session with emphasis on functional mobility/transfers, generalized strengthening, dynamic standing balance/coordination, gait training, stair navigation, and improved activity tolerance. Pt performed bed mobility with supervision. PT suggested trying SPC due to improvements in balance and R knee ROM, however pt very hesitant and politely declined due to personal preference. Provided pt with rollator and educated pt on brakes management and importance of backing rollator against stable surface prior to sitting. Pt performed all sit<>stands with rollator and supervision throughout session and ambulated 1107ft x 2 and 58ft x 2 trials with rollator and CGA/supervision - pt demonstrated good carry over with safety precautions when using rollator requiring only min  cues. Pt performed lateral side stepping in // bars with no UE support x 2 laps progressing to using orange TB around ankles for additional x4 laps with BUE support with emphasis on hip abductor strengthening. Pt ambulated to/from staircase without AD and CGA/min A (~80ft) and navigated 8 steps with 1 rail and CGA/min A to simulate home environment. First 4 steps using a lateral stepping technique with BUE support on 1 rail and second 4 steps ascending and descending forwards with a step to pattern using 1 rail. Ultimately decided using lateral stepping method is easier and safer for pt at home. Suggested again trying [facility] for improved independence with gait and pt agreed to try. Pt ambulated 52ft around room with [facility] and supervision demonstrating good technique and safety awareness but reporting that he still prefers to use the rollator. Concluded session with pt sitting EOB, needs within reach, and bed alarm on.   Treatment Session 2 Received pt sitting EOB, pt agreeable to PT treatment, and denied any pain during session. Session with emphasis on functional mobility/transfers, generalized strengthening, dynamic standing balance/coordination, gait training, and improved activity tolerance. Pt tied shoes with set up assist. Sit<>stands with rollator and CGA throughout session with good understanding of brakes management. Pt ambulated >379ft x 2 trials with rollator and supervision to/from dayroom. Pt transferred sit<>stand on Airex x 2 trials with CGA/light min A and worked on dynamic standing balance playing cornhole while standing on Airex x 3 trials with CGA/min A for balance with 1 R lateral LOB. Concluded session with pt sitting EOB with all needs within reach and family present  at bedside. Provided pt with fresh ice water.   Therapy Documentation Precautions:  Precautions Precautions: Fall Precaution Comments: colostomy, bandages over full abdomen, R knee ROM limitations Restrictions Weight Bearing  Restrictions: No  Therapy/Group: Individual Therapy Bates [place] PT, DPT   09/15/2020, 7:25 AM

## 2020-09-15 NOTE — Progress Notes (Signed)
Physical Therapy Session Note  Patient Details  Name: Gautham Roosevelt MRN: [id number] Date of Birth: Jan 28, 1961  Today's Date: 09/15/2020 PT Individual Time: 1130-1200 PT Individual Time Calculation (min): 30 min   Short Term Goals: Week 2:  PT Short Term Goal 1 (Week 2): pt will perform sit<>stand transfers with min A consistantly PT Short Term Goal 2 (Week 2): Pt will initiate stair training PT Short Term Goal 3 (Week 2): Pt will improve RLE ROM to 75+ flexion  Skilled Therapeutic Interventions/Progress Updates: Pt presents supine in bed and stating that he needed a rest.  Pt convinced to at least participate w/ therapy for extremity strengthening.  Pt transferred sup to sit w/ supervision and performed LE there ex sitting EOB.  Pt performed calf raises 3 x 15, LAQ, hip flexion and isometric add 3 x 10.  Pt also performed overhead press 3 x 10 w/ verbal cues for breathing technique and core stabilization.  Pt remained sitting at EOB w/ all needs in reach.     Therapy Documentation Precautions:  Precautions Precautions: Fall Precaution Comments: colostomy, bandages over full abdomen, R knee ROM limitations Restrictions Weight Bearing Restrictions: No General:   Vital Signs:   Pain:0/10 Pain Assessment Pain Score: 2  Pain Type: Acute pain Pain Location: Abdomen Pain Orientation: Mid;Lower Pain Descriptors / Indicators: Aching;Discomfort Pain Frequency: Several days a week Pain Onset: Gradual Patients Stated Pain Goal: 2 Pain Intervention(s): Medication (See eMAR);Distraction;Guided imagery;Pain med given for lower pain score than stated, per patient request PAINAD (Pain Assessment in Advanced Dementia) Breathing: normal Negative Vocalization: none Facial Expression: smiling or inexpressive Body Language: relaxed Mobility:    Other Treatments:      Therapy/Group: Individual Therapy  Ladoris Gene 09/15/2020, 12:14 PM

## 2020-09-15 NOTE — Progress Notes (Signed)
PROGRESS NOTE   Subjective/Complaints: Left knee pain continues to be improved!  ROS: Denies CP, SOB, N/V/D, left knee pain  Objective:   No results found. Recent Labs    09/14/20 0734  WBC 7.8  HGB 10.6*  HCT 33.4*  PLT 511*     Recent Labs    09/14/20 0734  NA 133*  K 4.1  CL 99  CO2 27  GLUCOSE 220*  BUN 27*  CREATININE 0.70  CALCIUM 9.7    Intake/Output Summary (Last 24 hours) at 09/15/2020 1145 Last data filed at 09/15/2020 1121 Gross per 24 hour  Intake 537 ml  Output 1525 ml  Net -988 ml     Pressure Injury 09/01/20 Buttocks Left Deep Tissue Pressure Injury - Purple or maroon localized area of discolored intact skin or blood-filled blister due to damage of underlying soft tissue from pressure and/or shear. Purple colored area (Active)  09/01/20 1530  Location: Buttocks  Location Orientation: Left  Staging: Deep Tissue Pressure Injury - Purple or maroon localized area of discolored intact skin or blood-filled blister due to damage of underlying soft tissue from pressure and/or shear.  Wound Description (Comments): Purple colored area  Present on Admission: Yes     Pressure Injury 09/09/20 Buttocks Right Stage 2 -  Partial thickness loss of dermis presenting as a shallow open injury with a red, pink wound bed without slough. (Active)  09/09/20 1400  Location: Buttocks  Location Orientation: Right  Staging: Stage 2 -  Partial thickness loss of dermis presenting as a shallow open injury with a red, pink wound bed without slough.  Wound Description (Comments):   Present on Admission: No    Physical Exam: Vital Signs Blood pressure 117/82, pulse 87, temperature 97.6 F (36.4 C), temperature source Oral, resp. rate 17, height _0  (1.778 m), weight 65.5 kg, SpO2 99 %. Gen: no distress, normal appearing HEENT: oral mucosa pink and moist, NCAT Cardio: Reg rate Chest: normal effort, normal rate of  breathing  GI: Non-distended.  BS +.  + Ostomy. Skin: Warm and dry.  Bilateral abdominal wound with good granulation tissue, some fibrinous tissue noted in distal right wound bed. Right stage 2 pressure injury to right buttock Psych: Normal mood.  Normal behavior. Musc: No edema in extremities.  No tenderness in extremities. Neuro: Alert and oriented Motor: Grossly 4-4+/5 throughout  Assessment/Plan: 1. Functional deficits which require 3+ hours per day of interdisciplinary therapy in a comprehensive inpatient rehab setting. Physiatrist is providing close team supervision and 24 hour management of active medical problems listed below. Physiatrist and rehab team continue to assess barriers to discharge/monitor patient progress toward functional and medical goals  Care Tool:  Bathing    Body parts bathed by patient: Right arm, Left arm, Chest, Abdomen, Right upper leg, Left upper leg, Face, Right lower leg, Left lower leg (declined buttocks/periarea today)   Body parts bathed by helper: Right lower leg, Left lower leg, Buttocks     Bathing assist Assist Level: Supervision/Verbal cueing     Upper Body Dressing/Undressing Upper body dressing   What is the patient wearing?: Pull over shirt    Upper body assist Assist Level:  Independent with assistive device    Lower Body Dressing/Undressing Lower body dressing      What is the patient wearing?: Pants     Lower body assist Assist for lower body dressing: Supervision/Verbal cueing     Toileting Toileting    Toileting assist Assist for toileting: Set up assist Assistive Device Comment: Urinal   Transfers Chair/bed transfer  Transfers assist     Chair/bed transfer assist level: Contact Guard/Touching assist     Locomotion Ambulation   Ambulation assist      Assist level: Contact Guard/Touching assist Assistive device: Rollator Max distance: 14f   Walk 10 feet activity   Assist     Assist level:  Contact Guard/Touching assist Assistive device: Rollator   Walk 50 feet activity   Assist Walk 50 feet with 2 turns activity did not occur: Safety/medical concerns  Assist level: Contact Guard/Touching assist Assistive device: Rollator    Walk 150 feet activity   Assist Walk 150 feet activity did not occur: Safety/medical concerns  Assist level: Contact Guard/Touching assist Assistive device: Walker-rolling    Walk 10 feet on uneven surface  activity   Assist Walk 10 feet on uneven surfaces activity did not occur: Safety/medical concerns         Wheelchair     Assist Will patient use wheelchair at discharge?: No Type of Wheelchair: Manual    Wheelchair assist level: Supervision/Verbal cueing Max wheelchair distance: 140    Wheelchair 50 feet with 2 turns activity    Assist        Assist Level: Supervision/Verbal cueing   Wheelchair 150 feet activity     Assist      Assist Level: Maximal Assistance - Patient 25 - 49%   Blood pressure 117/82, pulse 87, temperature 97.6 F (36.4 C), temperature source Oral, resp. rate 17, height _0  (1.778 m), weight 65.5 kg, SpO2 99 %.  Medical Problem List and Plan: 1.  Debility Continue CIR  -Interdisciplinary Team Conference today   2.  L-gastroc DVT: continue Eliquis. No thrombus seen on dopplers 8/6. There are swollen groin LN bilaterally- this could be a response to infection.              -antiplatelet therapy: N/A 3. Pain from bilateral lower extremity swelling:  Continue Oxycodone prn- currently requiring 170mdue to pain from swelling --Robaxin 1000 mg tid Ice/elevation Cr stable on Lasix- continue low dose supplementation. See #11 Added voltaren gel scheduled for right knee List of foods that can help with pain.  Advised patient to try using oxycodone 1545m0 minutes prior to therapy sessions for maximal benefit. Discontinued dilaudid.  Controlled on 8/18 4. Mood: LCSW to follow for  evaluation and support.             -antipsychotic agents: N/AA 5. Neuropsych: This patient is capable of making decisions on his own behalf. 6. Abdominal incisions (3) and colostomy: Damp to dry dressing changes twice daily. Please initiate education of wife on dressing changes. Will try to get home health aide to assist him. 7. Fluids/Electrolytes/Nutrition: Monitor intake/output.   -Continue Juven twice daily.   -Continue vitamin C and zinc to help promote wound healing.  8. Wound infection: Has completed 9. Hyponatremia: Continue 1800 cc/FR  Improving.  10. Acute blood loss anemia: Monitor with serial checks.             Hemoglobin 9.0 on 8/8, labs ordered for tomorrow 11.  Polyarthralgias, gout with effusion: Continue Celebrex daily.  Good results from ortho aspiration. Continue Lasix 47m daily. Discussed with patient that ESR and CRP are trending downward.  -Cr and K+ stable, discontinue Lasix 12. New diagnosis T2DM: Hgb A1C-7.4.              --monitor BS ac/hs and use SSI for elevated BS.             --consulted RD for dietary education.  --provided with list of foods that are good for diabetes.   -d/c Boost since and use Ensure Max instead.   -educated that control has been better, discussed avoidance of juice. Educated that recent spike is due to steroids.   Appears to be stabilizing and elevated, will consider medication initiation if persistent  LOS: 14 days A FACE TO FACE EVALUATION WAS PERFORMED  Roger Brennan Test 09/15/2020, 11:45 AM

## 2020-09-15 NOTE — Plan of Care (Signed)
  Problem: RH Wheelchair Mobility Goal: LTG Patient will propel w/c in controlled environment (PT) Description: LTG: Patient will propel wheelchair in controlled environment, # of feet with assist (PT) Outcome: Not Applicable Flowsheets (Taken 09/15/2020 0752) LTG: Pt will propel w/c in controlled environ  assist needed:: (D/C) -- Note: D/C

## 2020-09-15 NOTE — Progress Notes (Signed)
Occupational Therapy Session Note  Patient Details  Name: Roger Brennan MRN: [id number] Date of Birth: [birth year]  Today's Date: 09/15/2020 OT Individual Time: [id number] OT Individual Time Calculation (min): 60 min and 25 min   Short Term Goals: Week 2:  OT Short Term Goal 1 (Week 2): Pt will complete LB dressing with mod assist with AE as needed OT Short Term Goal 2 (Week 2): Pt will complete toilet transfer with min assist OT Short Term Goal 3 (Week 2): Pt will complete 2 grooming tasks in standing to demonstrate increased standing tolerance  Skilled Therapeutic Interventions/Progress Updates:    1) Treatment session with focus on self-care retraining, dynamic standing balance, and BLE strengthening/endurance.  Pt received upright at EOB agreeable to therapy session.  Pt ambulated to sink with RW to complete grooming and UB dressing.  Pt completed both at Mod I level when seated.  Pt ambulated to ortho gym with RW with supervision.  Pt engaged in 4 mins on Nustep level 6 and 4 mins on level 7 for cardiovascular endurance and BLE ROM.  Therapist directed pt in calf raises, toe raises and forward and lateral lunges for improved mobility, balance, and endurance as needed for ADLs and IADLs.  Pt utilized UE support from walker and then horizontal bar for UE support during modified lunges.  Pt ambulated back to room and left seated upright in w/c with chair alarm on and all needs in reach.  2) Treatment session with focus on functional mobility, dynamic standing balance, and BLE strengthening/endurance.  Pt received upright at EOB agreeable to therapy session.  Pt ambulated in to bathroom with Rollator with Supervision and completed toileting in standing with distant supervision.  Engaged in single leg heel raises from 6" step 2 sets of 10 BLE with CGA and BUE support on rail.  Engaged in grape vine pattern with UE support with CGA for dynamic balance and balance reactions.  Completed  dynamic standing with reaching in all planes while standing on dynadisc for increased balance challenge.  Pt ambulated back to room with Rollator supervision and left seated EOB with all needs in reach.  Therapy Documentation Precautions:  Precautions Precautions: Fall Precaution Comments: colostomy, bandages over full abdomen, R knee ROM limitations Restrictions Weight Bearing Restrictions: No Pain: Pain Assessment Pain Score: 2  Pain Type: Acute pain Pain Location: Abdomen Pain Orientation: Mid;Lower Pain Descriptors / Indicators: Aching;Discomfort Pain Frequency: Several days a week Pain Onset: Gradual Patients Stated Pain Goal: 2 Pain Intervention(s): Medication (See eMAR);Distraction;Guided imagery;Pain med given for lower pain score than stated, per patient request PAINAD (Pain Assessment in Advanced Dementia) Breathing: normal Negative Vocalization: none Facial Expression: smiling or inexpressive Body Language: relaxed   Therapy/Group: Individual Therapy  Simonne Come 09/15/2020, 10:51 AM

## 2020-09-15 NOTE — Progress Notes (Signed)
Nutrition Follow-up  DOCUMENTATION CODES:   Non-severe (moderate) malnutrition in context of acute illness/injury  INTERVENTION:  Continue Ensure Max po BID, each supplement provides 150 kcal and 30 grams of protein.    Continue Juven BID, each packet provides 95 calories, 2.5 grams of protein.   Encourage adequate PO intake.   NUTRITION DIAGNOSIS:   Moderate Malnutrition related to acute illness as evidenced by mild fat depletion, mild muscle depletion; ongoing  GOAL:   Patient will meet greater than or equal to 90% of their needs; progressing  MONITOR:   PO intake, Supplement acceptance, Skin, Weight trends, Labs, I & O's  REASON FOR ASSESSMENT:   Malnutrition Screening Tool    ASSESSMENT:   60 year old male with history of HTN in the past otherwise in good health who started having issues with constipation since early June with decreased appetite, 20 lbs wt loss, abdominal pain progressing to intermittent fevers X few weeks. He was admitted on 08/12/20 to [facility] with abdominal pain due to diverticular perforation with  11.5 X 5 cm abscess and fistulization to abdominal wall. He was taken to OR emergently for Exp lap with debridement of LLQ wall, splenic mobilization and  partial colectomy with end colostomy by Dr. Georgiana Spinner.  Blood cultures negative and UCS showed E Coli.  He was placed on broad-spectrum antibiotics but continued to have upward rise in white count to 41,000 as well as confusion.     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    He was made n.p.o. with TNA for nutritional support.  He was treated with 7-day course of Eraxis and Zosyn  x14 days.  Pathology showed evidence of diverticulitis without malignancy.  Acute blood loss anemia treated with transfusion and reactive leukocytosis has been resolving.     He developed acute DVT in right gastrocnemius vein as well as distal R- axillary DVT and surrounding PICC and SVT in basilic vein R antecubital fossa.  Significant Events: 7/13- admission with dx of abdominal wall abscess, necrotizing soft tissue of abdominal wall, perforated diverticulitis with colocutaneous fistula; I&D LLQ abdominal wall, partial colectomy and end colostomy, and splenic mobilization; NGT placement 7/15- initial RD assessment; exploration for concern of leak and inspection of L flank, LOAs, SBR with anastomosis, enterorrhaphy x2, debridement of necrotic fat and fascia of abdominal wall 7/16- placement of double lumen PICC in R basilic; debridement of abdominal wall with sharp dissection and pulsavac of wounds 7/18- NGT removal; diet advanced to CLD 7/19- diet advanced to FLD 7/20- diet advanced to Soft 7/21- TPN stopped  Meal completion has been 50-90%. Pt currently has Ensure max and Juven ordered and has been consuming them. RD to continue with current orders to aid in caloric and protein needs. Pt encouraged to eat his food at meals and to drink his supplements. Labs and medications reviewed.   Diet Order:   Diet Order             DIET SOFT Room service appropriate? Yes; Fluid consistency: Thin; Fluid restriction: 1800 mL Fluid  Diet effective now  EDUCATION NEEDS:   No education needs have been identified at this time  Skin:  Skin Assessment: Reviewed RN Assessment Skin Integrity Issues:: Incisions, DTI DTI: L buttocks Incisions: Abdomen x3, closed  Last BM:  8/16 colostomy 200 ml output  Height:   Ht Readings from Last 1 Encounters:  09/01/20 '5\' 10"'$  (1.778 m)    Weight:   Wt Readings from Last 1 Encounters:  09/15/20 65.5 kg   BMI:  Body mass index is 20.72  kg/m.  Estimated Nutritional Needs:   Kcal:  2400-2600  Protein:  125-140 grams  Fluid:  1.8 L/day  Corrin Parker, MS, RD, LDN RD pager number/after hours weekend pager number on Amion.

## 2020-09-15 NOTE — Progress Notes (Signed)
Pt slept well throughout the night. PRN oxycodone given x2. Dressings changed to abdomen. Tolerated well.

## 2020-09-15 NOTE — Patient Care Conference (Signed)
Inpatient RehabilitationTeam Conference and Plan of Care Update Date: 09/15/2020   Time: 13:38 PM    Patient Name: Roger Brennan      Medical Record Number: [id number]  Date of Birth: Mar 29, 1960 Sex: Male         Room/Bed: 4M02C/4M02C-01 Payor Info: Payor: CIG[employer] / Plan: CIGNA MANAGED / Product Type: *No Product type* /    Admit Date/Time:  09/01/2020  2:56 PM  Primary Diagnosis:  [facility] Problems: Principal Problem:   Debility Active Problems:   Malnutrition of moderate degree   Acute idiopathic gout of right knee   Acute blood loss anemia   Incisional pain   Diabetes mellitus, new onset ([place])   Hyponatremia    Expected Discharge Date: Expected Discharge Date: 09/22/20  Team Members Present: Physician leading conference: Dr. Leeroy Cha Social Worker Present: Ovidio Kin, LCSW Nurse Present: Dorien Chihuahua, RN PT Present: Becky Sax, PT OT Present: Simonne Come, OT PPS Coordinator present : Ileana Ladd, PT     Current Status/Progress Goal Weekly Team Focus  Bowel/Bladder   Continent of urine, has a colostomy. LBM-8/16.  Patient will remain continent of bowel and bladder  Assess B/B every shift and prn.   Swallow/Nutrition/ Hydration             ADL's   CGA LB bathing and dressing, Mod I UB bathing/dressing, CGA - S ambulatory transfers with RW  upgraded to Mod I overall, supervision/setup bathing  ADL retraining, functional mobility, strengthening/endurance, pt/family education   Mobility   bed mobility supervision, transfers with RW CGA, gait 132f with RW CGA, 8 3in steps with 2 rails CGA, WC mobility [id number]  mod I/supervision  functional mobility/transfers, generalized strengthening, dynamic standing balance/coordination, gait training, stair navigation, and endurance.   Communication             Safety/Cognition/ Behavioral Observations            Pain   Reports pain 3/10 at rest, with activity pain up to 6/7. Managed with prn  oxycodone.  Pain will be <4/10.  Assess and address pain everys shift and prn.   Skin   Healing surgical wounds to bilateral abdomen, Stage II wound to buttocks-healing  Promote healing, no further areas of skin impairment.  Assess skin every shift and prn.     Discharge Planning:  Wife here daily needs to be educated on any dressing changes or nursing care, due to will need to go to OP due to insurance issues   Team Discussion: Patient has made great progress after knee aspiration and steroid injection. Reinforce self care for ostomy care and dressing change.  Patient on target to meet rehab goals: yes, currently supervision for bed mobility and transfers with CGA using a rollator. Able to ambulate up to 100' with a RW and CGA. Able to manage 8 small steps with CGA. Completed bathing and dressing seated with CGA - Supervision  and requires supervision to incorporate care of ostomy into ADLs.  *See Care Plan and progress notes for long and short-term goals.   Revisions to Treatment Plan:  Upgraded goals to Mod I level   Teaching Needs: Dressing change, ostomy care, medications, safety, etc  Current Barriers to Discharge: Decreased caregiver support, Home enviroment access/layout, Wound care, and ostomy  Possible Resolutions to Barriers: Family education OP PT follow up recommended     Medical Summary Current Status: abdominal incisions healing well, swelling much improved in knee, pain is much improved, CBGs elevated  Barriers to Discharge: Medical stability;Wound care  Barriers to Discharge Comments: abdominal incisions healing well, swelling much improved in knee, pain is much improved, CBGs elevated Possible Resolutions to [employer] Focus: continue to monitor incisions daily, patient education regarding incision care, continue volataren gel/celebrex/oxycodone for pain relief, provide dietary education   Continued Need for Acute Rehabilitation Level of Care: The patient  requires daily medical management by a physician with specialized training in physical medicine and rehabilitation for the following reasons: Direction of a multidisciplinary physical rehabilitation program to maximize functional independence : Yes Medical management of patient stability for increased activity during participation in an intensive rehabilitation regime.: Yes Analysis of laboratory values and/or radiology reports with any subsequent need for medication adjustment and/or medical intervention. : Yes   I attest that I was present, lead the team conference, and concur with the assessment and plan of the team.   Dorien Chihuahua B 09/15/2020, 2:53 PM

## 2020-09-16 LAB — GLUCOSE, CAPILLARY
Glucose-Capillary: 137 mg/dL — ABNORMAL HIGH (ref 70–99)
Glucose-Capillary: 124 mg/dL — ABNORMAL HIGH (ref 70–99)
Glucose-Capillary: 133 mg/dL — ABNORMAL HIGH (ref 70–99)
Glucose-Capillary: 155 mg/dL — ABNORMAL HIGH (ref 70–99)

## 2020-09-16 NOTE — Progress Notes (Signed)
Physical Therapy Weekly Progress Note  Patient Details  Name: Roger Brennan MRN: [id number] Date of Birth: 1960/12/23  Beginning of progress report period: September 02, 2020 End of progress report period: September 16, 2020  Patient has met 3 of 3 short term goals. Pt demonstrates steady progress towards long term goals. Pt is currently able to perform bed mobility with supervision, transfers with rollator and close supervision, ambulate >156f with rollator and close supervision, and navigate 8 steps with 1 rail and CGA/might min A. Pt demonstrates significant improvements in R knee flexion ROM as pt currently able to achieve ~90 degrees and does not complain of knee pain during sessions.   Patient continues to demonstrate the following deficits muscle weakness and muscle joint tightness, decreased cardiorespiratoy endurance, and decreased standing balance, decreased postural control, and decreased balance strategies and therefore will continue to benefit from skilled PT intervention to increase functional independence with mobility.  Patient progressing toward long term goals..  Continue plan of care.  PT Short Term Goals Week 2:  PT Short Term Goal 1 (Week 2): pt will perform sit<>stand transfers with min A consistantly PT Short Term Goal 1 - Progress (Week 2): Met PT Short Term Goal 2 (Week 2): Pt will initiate stair training PT Short Term Goal 2 - Progress (Week 2): Met PT Short Term Goal 3 (Week 2): Pt will improve RLE ROM to 75+ flexion PT Short Term Goal 3 - Progress (Week 2): Met Week 3:  PT Short Term Goal 1 (Week 3): STG=LTG due to LOS  Skilled Therapeutic Interventions/Progress Updates:  Ambulation/gait training;Cognitive remediation/compensation;Discharge planning;DME/adaptive equipment instruction;Balance/vestibular training;Community reintegration;Disease management/prevention;Neuromuscular re-education;Functional electrical stimulation;Patient/family education;Skin care/wound  mLandscape architectTherapeutic Exercise;UE/LE Coordination activities;Wheelchair propulsion/positioning;Functional mobility training;Pain management;Psychosocial support;Splinting/orthotics;Therapeutic Activities;UE/LE Strength taining/ROM;Visual/perceptual remediation/compensation   Therapy Documentation Precautions:  Precautions Precautions: Fall Precaution Comments: colostomy, bandages over full abdomen, R knee ROM limitations Restrictions Weight Bearing Restrictions: No  Therapy/Group: Individual Therapy AWaleed DettmanPT, DPT   09/16/2020, 7:26 AM

## 2020-09-16 NOTE — Progress Notes (Signed)
Occupational Therapy Session Note  Patient Details  Name: Roger Brennan MRN: [id number] Date of Birth: 21-Jun-1960  Today's Date: 09/16/2020 OT Individual Time: 1450-1515 OT Individual Time Calculation (min): 25 min    Short Term Goals: Week 1:  OT Short Term Goal 1 (Week 1): Pt will use LRAD to don LB clothing with mod A OT Short Term Goal 1 - Progress (Week 1): Progressing toward goal OT Short Term Goal 2 (Week 1): Pt will complete sit > stand with mod A OT Short Term Goal 2 - Progress (Week 1): Met OT Short Term Goal 3 (Week 1): Pt will complete stand pivot transfer with mod A OT Short Term Goal 3 - Progress (Week 1): Met  Skilled Therapeutic Interventions/Progress Updates:    Attempted to make up missed time from a couple days ago, however pt sitting EOB eating breakfast. Pt politely declines make up tx d/t wanting to finish breakfast.   Afternoon session: Pt received in EOB with no pain reported. Pt ambulates to/from tx gym with Rolator with S overall  Therapeutic exercise Pt completes 2x10-15 dowel rod therex for BUE shoulder strengthening required for BADLs/functional transfers as follows with demo cuing and 4 # dowel rod  Shoulder flex/ext, shoulder press, horizonal ab/adduct Circles in B directions Elbow flex/ext, chest press Wrist flex/ext  Pt and wife very concerned about wound changes and education from nursing. Connected with CSW and nurse care coordinator about pt concerns. Pt left at end of session in EOB with exit alarm on, call light in reach and all needs met   Therapy Documentation Precautions:  Precautions Precautions: Fall Precaution Comments: colostomy, bandages over full abdomen, R knee ROM limitations Restrictions Weight Bearing Restrictions: No General:   Vital Signs: Therapy Vitals Temp: 97.6 F (36.4 C) Temp Source: Oral Pulse Rate: 86 Resp: 16 BP: 111/79 Patient Position (if appropriate): Lying Oxygen Therapy SpO2: 99 % O2 Device: Room  Air Pain:   ADL: ADL Eating: Supervision/safety, Set up Where Assessed-Eating: Bed level Grooming: Supervision/safety, Setup Where Assessed-Grooming: Sitting at sink Upper Body Bathing: Supervision/safety, Setup Where Assessed-Upper Body Bathing: Edge of bed Lower Body Bathing: Supervision/safety, Setup Where Assessed-Lower Body Bathing: Edge of bed Upper Body Dressing: Supervision/safety, Setup Where Assessed-Upper Body Dressing: Edge of bed Lower Body Dressing: Maximal assistance Where Assessed-Lower Body Dressing: Edge of bed Toileting: Minimal assistance (urinal, colostomy) Where Assessed-Toileting: Bed level Toilet Transfer: Unable to assess Toilet Transfer Method: Stand pivot Vision   Perception    Praxis   Exercises:   Other Treatments:     Therapy/Group: Individual Therapy  Tonny Branch 09/16/2020, 6:56 AM

## 2020-09-16 NOTE — Progress Notes (Signed)
Occupational Therapy Weekly Progress Note  Patient Details  Name: Roger Brennan MRN: [id number] Date of Birth: 11-Mar-1960  Beginning of progress report period: September 08, 2020 End of progress report period: September 16, 2020  Today's Date: 09/16/2020 OT Individual Time: 2060-1561 OT Individual Time Calculation (min): 57 min    Patient has met 3 of 3 short term goals.  Pt is making steady progress towards goals.  Pt has progressed from RW to Rollator and is able to complete functional mobility and transfers with Rollator at supervision level.  Pt is able to complete LB bathing and dressing at sit > stand level without use of AE with supervision.  Pt is demonstrating increased standing balance and endurance, completing grooming tasks in standing.  Pt is overall demonstrating improved mobility, activity tolerance and progress towards goals.    Patient continues to demonstrate the following deficits: muscle joint tightness, decreased coordination, and decreased sitting balance, decreased standing balance, decreased postural control, and decreased balance strategies  and therefore will continue to benefit from skilled OT intervention to enhance overall performance with BADL and Reduce care partner burden.  Patient progressing toward long term goals..  Continue plan of care.  Goals upgraded 8/15.  OT Short Term Goals Week 2:  OT Short Term Goal 1 (Week 2): Pt will complete LB dressing with mod assist with AE as needed OT Short Term Goal 1 - Progress (Week 2): Met OT Short Term Goal 2 (Week 2): Pt will complete toilet transfer with min assist OT Short Term Goal 2 - Progress (Week 2): Met OT Short Term Goal 3 (Week 2): Pt will complete 2 grooming tasks in standing to demonstrate increased standing tolerance OT Short Term Goal 3 - Progress (Week 2): Met Week 3:  OT Short Term Goal 1 (Week 3): STG = LTGs due to remaining LOS  Skilled Therapeutic Interventions/Progress Updates:    Treatment session  with focus on self-care retraining, functional mobility, and higher level balance.  Pt received seated EOB agreeable to therapy session.  Pt ambulated to sink ~8' without AD with close supervision.  Pt completed oral care in standing and washed face.  Pt then washed off arms and legs while standing, not completing full body bathing.  Pt ambulated to therapy gym with Rollator with supervision.  Therapist directed pt in balance activities on Biodex with CGA and min cues for weight shifting.  Engaged in balance activities on Bosu ball inside parallel bars with CGA and cues for weight shifting as needed for balance during ADLs and IADLs.  Pt reports enjoying increased balance challenge.  Pt returned to room and left seated upright in recliner with chair alarm on and all needs in reach.  Therapy Documentation Precautions:  Precautions Precautions: Fall Precaution Comments: colostomy, bandages over full abdomen, R knee ROM limitations Restrictions Weight Bearing Restrictions: No General:   Vital Signs: Therapy Vitals Temp: 97.6 F (36.4 C) Temp Source: Oral Pulse Rate: 86 Resp: 16 BP: 111/79 Patient Position (if appropriate): Lying Oxygen Therapy SpO2: 99 % O2 Device: Room Air Pain:  Pt with no c/o pain    Therapy/Group: Individual Therapy  Simonne Come 09/16/2020, 7:27 AM

## 2020-09-16 NOTE — Progress Notes (Signed)
PROGRESS NOTE   Subjective/Complaints: Discussed dressing changes- he will make effort to learn himself and becky will give him a list of private pay agencies today  ROS: Denies CP, SOB, N/V/D, left knee pain much improved  Objective:   No results found. Recent Labs    09/14/20 0734  WBC 7.8  HGB 10.6*  HCT 33.4*  PLT 511*     Recent Labs    09/14/20 0734  NA 133*  K 4.1  CL 99  CO2 27  GLUCOSE 220*  BUN 27*  CREATININE 0.70  CALCIUM 9.7    Intake/Output Summary (Last 24 hours) at 09/16/2020 1110 Last data filed at 09/16/2020 8768 Gross per 24 hour  Intake 340 ml  Output 2370 ml  Net -2030 ml         Physical Exam: Vital Signs Blood pressure 111/79, pulse 86, temperature 97.6 F (36.4 C), temperature source Oral, resp. rate 16, height $RemoveBe'5\' 10"'bfwwuhZsg$  (1.778 m), weight 66.6 kg, SpO2 99 %. Gen: no distress, normal appearing HEENT: oral mucosa pink and moist, NCAT Cardio: Reg rate Chest: normal effort, normal rate of breathing   GI: Non-distended.  BS +.  + Ostomy. Skin: Warm and dry.  Bilateral abdominal wound with good granulation tissue, some fibrinous tissue noted in distal right wound bed. Right stage 2 pressure injury to right buttock Psych: Normal mood.  Normal behavior. Musc: No edema in extremities.  No tenderness in extremities. Neuro: Alert and oriented Motor: Grossly 4-4+/5 throughout  Assessment/Plan: 1. Functional deficits which require 3+ hours per day of interdisciplinary therapy in a comprehensive inpatient rehab setting. Physiatrist is providing close team supervision and 24 hour management of active medical problems listed below. Physiatrist and rehab team continue to assess barriers to discharge/monitor patient progress toward functional and medical goals  Care Tool:  Bathing    Body parts bathed by patient: Right arm, Left arm, Right upper leg, Left upper leg, Face, Right lower leg,  Left lower leg, Front perineal area   Body parts bathed by helper: Right lower leg, Left lower leg, Buttocks     Bathing assist Assist Level: Supervision/Verbal cueing     Upper Body Dressing/Undressing Upper body dressing   What is the patient wearing?: Pull over shirt    Upper body assist Assist Level: Independent with assistive device    Lower Body Dressing/Undressing Lower body dressing      What is the patient wearing?: Pants     Lower body assist Assist for lower body dressing: Supervision/Verbal cueing     Toileting Toileting    Toileting assist Assist for toileting: Supervision/Verbal cueing Assistive Device Comment: Urinal   Transfers Chair/bed transfer  Transfers assist     Chair/bed transfer assist level: Supervision/Verbal cueing     Locomotion Ambulation   Ambulation assist      Assist level: Contact Guard/Touching assist Assistive device: Rollator Max distance: 140ft   Walk 10 feet activity   Assist     Assist level: Contact Guard/Touching assist Assistive device: Rollator   Walk 50 feet activity   Assist Walk 50 feet with 2 turns activity did not occur: Safety/medical concerns  Assist level: Contact Guard/Touching assist  Assistive device: Rollator    Walk 150 feet activity   Assist Walk 150 feet activity did not occur: Safety/medical concerns  Assist level: Contact Guard/Touching assist Assistive device: Walker-rolling    Walk 10 feet on uneven surface  activity   Assist Walk 10 feet on uneven surfaces activity did not occur: Safety/medical concerns         Wheelchair     Assist Will patient use wheelchair at discharge?: No Type of Wheelchair: Manual    Wheelchair assist level: Supervision/Verbal cueing Max wheelchair distance: 140    Wheelchair 50 feet with 2 turns activity    Assist        Assist Level: Supervision/Verbal cueing   Wheelchair 150 feet activity     Assist       Assist Level: Maximal Assistance - Patient 25 - 49%   Blood pressure 111/79, pulse 86, temperature 97.6 F (36.4 C), temperature source Oral, resp. rate 16, height $RemoveBe'5\' 10"'vLFYMbfAb$  (1.778 m), weight 66.6 kg, SpO2 99 %.  Medical Problem List and Plan: 1.  Debility Continue CIR   2.  L-gastroc DVT: continue Eliquis. No thrombus seen on dopplers 8/6. There are swollen groin LN bilaterally- this could be a response to infection.              -antiplatelet therapy: N/A 3. Pain from bilateral lower extremity swelling:  Continue Oxycodone prn- advised to use $Remo'10mg'FTlyZ$  rather than $RemoveBef'15mg'STlMthmWPS$  for moderate pain.  --Robaxin 1000 mg tid Ice/elevation Cr stable on Lasix- continue low dose supplementation. See #11 Added voltaren gel scheduled for right knee List of foods that can help with pain.  Discontinued dilaudid.  Controlled on 8/18 4. Mood: LCSW to follow for evaluation and support.             -antipsychotic agents: N/AA 5. Neuropsych: This patient is capable of making decisions on his own behalf. 6. Abdominal incisions (3) and colostomy: Damp to dry dressing changes twice daily. Please initiate education of wife on dressing changes. Will try to get home health aide to assist him. 7. Fluids/Electrolytes/Nutrition: Monitor intake/output.   -Continue Juven twice daily.   -Continue vitamin C and zinc to help promote wound healing.  8. Wound infection: Has completed 9. Hyponatremia: Continue 1800 cc/FR  Improving.  10. Acute blood loss anemia: Monitor with serial checks.Hgb reviewed; improved to 10.6 on 8/15 11.  Polyarthralgias, gout with effusion: Continue Celebrex daily.  Good results from ortho aspiration. Continue Lasix $RemoveBefore'20mg'HlXeNRIAoeHSw$  daily. Discussed with patient that ESR and CRP are trending downward.  -Cr and K+ stable, discontinue Lasix 12. New diagnosis T2DM: Hgb A1C-7.4.              --monitor BS ac/hs and use SSI for elevated BS.             --consulted RD for dietary education.  --provided with list of  foods that are good for diabetes.   -d/c Boost since and use Ensure Max instead.   -educated that control has been better, discussed avoidance of juice. Educated that recent spike is due to steroids.   Appears to be stabilizing and elevated, will consider medication initiation if persistent  LOS: 15 days A FACE TO FACE EVALUATION WAS PERFORMED  Corney Knighton P [employer] 09/16/2020, 11:10 AM

## 2020-09-16 NOTE — Progress Notes (Addendum)
Patient ID: Kathy Wares, male   DOB: [birth year], 60 y.o.   MRN: [id number] Met with pt to discuss team conference update goals of mod/I now due to upgrade and discharge still 8/23. Pt is pleased with the progress and still wanting [facility] for colostomy care. Have reached out to [facility] begin referral via [facility]. Spoke with Nancy-Coordinator gave information to have them look for home health for this gentleman. Is case number is [id number].  Faxed information needed for referral. Had reached out to [facility] and [facility] care the agencies who usually can take [place]. Made pt aware home health may not be found to service him due to RN shortage and difficulty with home health taking his insurance. Did give private duty RN resources for pt and wife to pursue. Hopefully between [facility] and this worker can find an agency to take his case. Will also call [facility] to make a referral for follow up appointment. Referred to [facility] [phone] not open today-will call Friday  3:00 Pm met with wife in the room to explain the insurance issues and pursuing home health agency to accept him. She is worried about the wounds not as much the ostomy care. She wants to know if it would help for pt to call himself. Told them it would not hurt. Will continue to work on getting home health. They have private duty list to privately hire if needed.

## 2020-09-16 NOTE — Progress Notes (Signed)
Physical Therapy Session Note  Patient Details  Name: Roger Brennan MRN: [id number] Date of Birth: 11-28-1960  Today's Date: 09/16/2020 PT Individual Time: 1000-1056 and 1415-1440 PT Individual Time Calculation (min): 56 min and 25 min  Short Term Goals: Week 2:  PT Short Term Goal 1 (Week 2): pt will perform sit<>stand transfers with min A consistantly PT Short Term Goal 1 - Progress (Week 2): Met PT Short Term Goal 2 (Week 2): Pt will initiate stair training PT Short Term Goal 2 - Progress (Week 2): Met PT Short Term Goal 3 (Week 2): Pt will improve RLE ROM to 75+ flexion PT Short Term Goal 3 - Progress (Week 2): Met Week 3:  PT Short Term Goal 1 (Week 3): STG=LTG due to LOS  Skilled Therapeutic Interventions/Progress Updates:  Treatment Session 1 Received pt sitting in recliner, pt agreeable to PT treatment, and did not state pain level during session. Session with emphasis on functional mobility/transfers, generalized strengthening, dynamic standing balance/coordination, gait training and community navigation, and improved activity tolerance. Pt reported urge to use restroom and ambulated to/from bathroom with rollator and supervision. Pt able to stand and void with supervision. Pt then ambulated >532f outside to reflection [facility] with 1 standing rest break on elevator prior to sitting to rest. Pt demonstrates good rollator safety awareness backing up rollator against brick wall prior to sitting without cues. Pt then ambulated additional [id number] 1 and [id number] 1 up/downhill and along uneven concrete surfaces and ascended and descended 4 steps with 1 handrail and CGA  x 2 trials with total A for therapist to move rollator up/down stairs. Pt performed 2x15 bilateral eccentric heel raises on 6in step with 1 UE support and CGA for balance. Pt ambulated through giftshop using rollator and supervision with emphasis on community navigation and  briefly discussed how to manage carrying items using rollator. Pt ambulated additional [id number] rollator and supervision back to room and transferred sit<>supine with supervision. Concluded session with pt semi-reclined in bed, needs within reach, and bed alarm on. Provided pt with ice pack to R ankle due to mild increase in pain.   Treatment Session 2 Received pt sitting EOB, pt agreeable to PT treatment, and did not state pain level during session but reported mild R ankle pain. Session with emphasis on functional mobility/transfers, generalized strengthening, dynamic standing balance/coordination, gait training, and improved activity tolerance. Donned shoes with set up assist and ambulated >300ft50f trials with rollator and supervision to/from dayroom with no LOB noted. Pt performed standing BLE strengthening on Kinetron at 20 cm/sec x 1 minute and 15 cm/sec x 1 minute with BUE support and supervision with emphasis on glute/quad strengthening (limited due to fatigue and increased R ankle discomfort). Concluded session with pt sitting EOB with all needs within reach awaiting next OT session. Provided pt with fresh ice water.   Therapy Documentation Precautions:  Precautions Precautions: Fall Precaution Comments: colostomy, bandages over full abdomen, R knee ROM limitations Restrictions Weight Bearing Restrictions: No  Therapy/Group: Individual Therapy Tunisha Ruland Jaidev SangerDPT   09/16/2020, 7:28 AM

## 2020-09-17 LAB — GLUCOSE, CAPILLARY
Glucose-Capillary: 124 mg/dL — ABNORMAL HIGH (ref 70–99)
Glucose-Capillary: 93 mg/dL (ref 70–99)
Glucose-Capillary: 169 mg/dL — ABNORMAL HIGH (ref 70–99)
Glucose-Capillary: 155 mg/dL — ABNORMAL HIGH (ref 70–99)

## 2020-09-17 NOTE — Progress Notes (Signed)
Physical Therapy Session Note  Patient Details  Name: Roger Brennan MRN: [id number] Date of Birth: 12-20-1960  Today's Date: 09/17/2020 PT Individual Time: 9509-3267 and 1245-8099  PT Individual Time Calculation (min): 74 min and 39 min  Short Term Goals: Week 2:  PT Short Term Goal 1 (Week 2): pt will perform sit<>stand transfers with min A consistantly PT Short Term Goal 1 - Progress (Week 2): Met PT Short Term Goal 2 (Week 2): Pt will initiate stair training PT Short Term Goal 2 - Progress (Week 2): Met PT Short Term Goal 3 (Week 2): Pt will improve RLE ROM to 75+ flexion PT Short Term Goal 3 - Progress (Week 2): Met Week 3:  PT Short Term Goal 1 (Week 3): STG=LTG due to LOS  Skilled Therapeutic Interventions/Progress Updates:   Treatment Session 1 Received pt sitting EOB with RN present administering medications, pt agreeable to PT treatment, and did not state pain level during session. Session with emphasis on functional mobility, toileting, generalized strengthening, dynamic standing balance/coordination, gait training, and improved activity tolerance. Sit<>stands with rollator and supervision throughout session. Pt ambulated to/from bathroom with rollator and supervision and able to perform all toileting tasks with distant supervision. Ambulated to/from sink and stood and washed hands and brushed teeth mod I. Pt ambulated >310ft  x 2 trials with rollator and supervision to/from dayroom and requested to work on leg exercises. Pt performed the following exercises with increased time and verbal cues for technique with multiple rest breaks throughout due to fatigue.  -seated LAQ 4x12 bilaterally with 4lb ankle weight -seated hip flexion 4x12 bilaterally with 4lb ankle weight  -squats with 4lb dumbbell 2x10 (unable to perform with 6lb medicine ball) -standing hip abduction with 2lb ankle weight 4x10 bilaterally  -standing hamstring curls with 2lb ankle weight on LLE 4x10 and unweighted on  RLE 3x10 - noted increased weakness R>L -standing gastroc stretch 3x15 second hold bilaterally  Pt then performed BUE/LE strengthening on Nustep at workload 6 for 5 minutes for a 135 total steps with emphasis on cardiovascular endurance. Concluded session with pt sitting EOB, needs within reach, and bed alarm on.   Treatment Session 2 Received pt semi-reclined in bed, pt agreeable to PT treatment, and did not state pain level during session. Session with emphasis on functional mobility, generalized strengthening, dynamic standing balance/coordination, gait training, and improved activity tolerance. Pt performed bed mobility with supervision and ambulated >358ft x 2 trials with rollator and supervision to/from dayroom. Negotiated obstacle course consisting of stepping over 2 dowels, weaving in/out cones, and ambulating over uneven surfaces with rollator and supervision with emphasis on community and obstacle navigation. Engaged in dynamic standing balance playing wii tennis for 5 games then transitioned to [place] for 10 rounds all with supervision and 1 UE support on rollator for balance; no LOB noted. Pt remained standing throughout entire session demonstrating improvements in aerobic capacity and overall endurance. Pt reported urge to toilet and ambulated to bathroom and able to perform all toileting tasks with supervision using rollator. Concluded session with pt sitting EOB, needs within reach, and bed alarm on.   Therapy Documentation Precautions:  Precautions Precautions: Fall Precaution Comments: colostomy, bandages over full abdomen, R knee ROM limitations Restrictions Weight Bearing Restrictions: No  Therapy/Group: Individual Therapy Layken Doenges PT, DPT   09/17/2020, 7:31 AM

## 2020-09-17 NOTE — Plan of Care (Signed)
  Problem: RH BOWEL ELIMINATION Goal: RH STG MANAGE BOWEL WITH ASSISTANCE Description: STG Manage Bowel with  mod I Assistance. Outcome: not progressing

## 2020-09-17 NOTE — Progress Notes (Signed)
RN educated patient on wound dressing per patient he is not ready to do dressing change here but will do it himself when at home. He said he has been observing nurses how to do the dressing change.

## 2020-09-17 NOTE — Progress Notes (Signed)
Occupational Therapy Session Note  Patient Details  Name: Roger Brennan MRN: [id number] Date of Birth: 11/17/1960  Today's Date: 09/17/2020 OT Individual Time: 1352-1430 OT Individual Time Calculation (min): 38 min    Short Term Goals: Week 3:  OT Short Term Goal 1 (Week 3): STG = LTGs due to remaining LOS  Skilled Therapeutic Interventions/Progress Updates:     Pt received semi-reclined in bed with wife present, no c/o pain, agreeable to therapy. Session focus on self-care retraining, activity tolerance, dynamic standing balance, energy conservation education in prep for improved ADL/IADL/func mobility performance + decreased caregiver burden. Pt / wife report no particular concerns prior to DC this upcoming Tuesday. Pt completed bed mobility mod I. Donned and tied B shoes with distant S for safety. STS and amb to and from Day Room with rollator, min A to lock brakes prior to initiating movement. Played 5 rounds of corn hole with close S for balance + no AD. No LOB. Pt able to retrieve bean bags from behind him and scored 89 points in total. Ind tallied points. Req 2 seated rest breaks between rounds 2/2 fatigue.Finally, played 2 rounds of tennis wii with no AD and close S for balance.  Amb transfer back to bed and pt opting to sit EOB.  Pt left seated EOB with bed alarm engaged, call bell in reach, and all immediate needs met.   Therapy Documentation Precautions:  Precautions Precautions: Fall Precaution Comments: colostomy, bandages over full abdomen, R knee ROM limitations Restrictions Weight Bearing Restrictions: No  Pain: no c/o   ADL: See Care Tool for more details.    Therapy/Group: Individual Therapy  Volanda Napoleon MS, OTR/L  09/17/2020, 6:49 AM

## 2020-09-17 NOTE — Progress Notes (Signed)
Patient educated on colostomy change and dressing change ; he claims that he learned more on this education session; patient claims he has a hard time learning because everybody does it differently.

## 2020-09-17 NOTE — Progress Notes (Signed)
Physical Therapy Session Note  Patient Details  Name: Roger Brennan MRN: [id number] Date of Birth: [birth year]  Today's Date: 09/17/2020 PT Individual Time: 2202-5427 PT Individual Time Calculation (min): 60 min   Short Term Goals: Week 2:  PT Short Term Goal 1 (Week 2): pt will perform sit<>stand transfers with min A consistantly PT Short Term Goal 1 - Progress (Week 2): Met PT Short Term Goal 2 (Week 2): Pt will initiate stair training PT Short Term Goal 2 - Progress (Week 2): Met PT Short Term Goal 3 (Week 2): Pt will improve RLE ROM to 75+ flexion PT Short Term Goal 3 - Progress (Week 2): Met  Skilled Therapeutic Interventions/Progress Updates: Pt presented at EOB agreeable to therapy. Pt denies pain at start of session. Pt requesting to use bathroom prior to leaving room. Performed STS and ambulatory transfer to bathroom with rollator and supervision. Pt then ambulated to day room and participated in NuStep ~6mn L4-5 and incrementally getting closer to NuStep to work on ROM. Pt then ambulated to mat and participated in STS from [employer] as well as ball toss standing on Airex for dynamic standing balance and endurance. Pt then progressed to ball bounce standing on Airex with physioball ~366m. After brief rest pt ambulated to rehab gym and participated in lunges to target for BLE strengthening and balance. Pt performed side/diagonal/forward lunges 3 rounds bilaterally. PTA then increased range for lunge and repeated rounds. Pt also performed ball toss to rebounder laterally for trunk rotation x 10 bilaterally. Pt ambulated back to room at supervision level with rollator and pt left EOB with call bell within reach and needs met.     Therapy Documentation Precautions:  Precautions Precautions: Fall Precaution Comments: colostomy, bandages over full abdomen, R knee ROM limitations Restrictions Weight Bearing Restrictions: No General:   Vital Signs: Therapy Vitals Temp: 97.7 F (36.5  C) Temp Source: Oral Pulse Rate: 82 Resp: 17 BP: 96/66 Patient Position (if appropriate): Sitting Oxygen Therapy SpO2: 100 % O2 Device: Room Air Pain:   Mobility:   Locomotion :    Trunk/Postural Assessment :    Balance:   Exercises:   Other Treatments:      Therapy/Group: Individual Therapy  [name] 09/17/2020, 4:05 PM

## 2020-09-17 NOTE — Progress Notes (Signed)
RN called back wife she left a message to call her back and expressed concern regarding care of ostomy when patient goes home; she claims she is anxious and wants to speak with the [place] RN whom they met before they came to rehab. Dan PA notified new orders noted.

## 2020-09-17 NOTE — Progress Notes (Signed)
Patient ID: Roger Brennan, male   DOB: 06-04-1960, 60 y.o.   MRN: [id number] [place] with Dana-CM for [facility] who reports is covered until 8/20. Contacted her to let know barrier is Carleene Overlie is still working on getting a [place] in place and has not found a [place] agency to take [place] referral. Have let wife know of this and she reports she wants a HHRN before he goes home due to wound issues. Nancy-Evicore who is pursuing [facility] agency is aware of insurance pushing for discharge. Dana-CM for [employer] reports touch base with tomorrow to let know if [facility] agency found if stays through weekend he will need to be seen by therapies. Will call tomorrow. Wife is aware of the current plan. Did not want to upset pt, wife feels the same.

## 2020-09-17 NOTE — Progress Notes (Signed)
PROGRESS NOTE   Subjective/Complaints: No complaints this morning His right knee pain has been improving- encouraged use of oxycodone $RemoveBefo'10mg'yGwESybEKZU$  rather than $RemoveBef'15mg'eWywaZqjcA$  and he will try today. He notes his wife has questions for me and I have just left a voicemail for her  ROS: Denies CP, SOB, N/V/D, left knee pain much improved  Objective:   No results found. No results for input(s): WBC, HGB, HCT, PLT in the last 72 hours.    No results for input(s): NA, K, CL, CO2, GLUCOSE, BUN, CREATININE, CALCIUM in the last 72 hours.   Intake/Output Summary (Last 24 hours) at 09/17/2020 1048 Last data filed at 09/17/2020 0610 Gross per 24 hour  Intake 240 ml  Output 300 ml  Net -60 ml         Physical Exam: Vital Signs Blood pressure 110/77, pulse 74, temperature (!) 97.5 F (36.4 C), temperature source Oral, resp. rate 18, height $RemoveBe'5\' 10"'OPrgSmlYJ$  (1.778 m), weight 66.5 kg, SpO2 100 %. Gen: no distress, normal appearing HEENT: oral mucosa pink and moist, NCAT Cardio: Reg rate Chest: normal effort, normal rate of breathing GI: Non-distended.  BS +.  + Ostomy. Skin: Warm and dry.  Bilateral abdominal wound with good granulation tissue, some fibrinous tissue noted in distal right wound bed. Right stage 2 pressure injury to right buttock Psych: Normal mood.  Normal behavior. Musc: No edema in extremities.  No tenderness in extremities. Neuro: Alert and oriented Motor: Grossly 4-4+/5 throughout  Assessment/Plan: 1. Functional deficits which require 3+ hours per day of interdisciplinary therapy in a comprehensive inpatient rehab setting. Physiatrist is providing close team supervision and 24 hour management of active medical problems listed below. Physiatrist and rehab team continue to assess barriers to discharge/monitor patient progress toward functional and medical goals  Care Tool:  Bathing    Body parts bathed by patient: Right arm, Left arm,  Right upper leg, Left upper leg, Face, Right lower leg, Left lower leg, Front perineal area   Body parts bathed by helper: Right lower leg, Left lower leg, Buttocks     Bathing assist Assist Level: Supervision/Verbal cueing     Upper Body Dressing/Undressing Upper body dressing   What is the patient wearing?: Pull over shirt    Upper body assist Assist Level: Independent with assistive device    Lower Body Dressing/Undressing Lower body dressing      What is the patient wearing?: Pants     Lower body assist Assist for lower body dressing: Supervision/Verbal cueing     Toileting Toileting    Toileting assist Assist for toileting: Supervision/Verbal cueing Assistive Device Comment: Urinal   Transfers Chair/bed transfer  Transfers assist     Chair/bed transfer assist level: Supervision/Verbal cueing     Locomotion Ambulation   Ambulation assist      Assist level: Supervision/Verbal cueing Assistive device: Rollator Max distance: >160ft   Walk 10 feet activity   Assist     Assist level: Supervision/Verbal cueing Assistive device: Rollator   Walk 50 feet activity   Assist Walk 50 feet with 2 turns activity did not occur: Safety/medical concerns  Assist level: Supervision/Verbal cueing Assistive device: Rollator    Walk  150 feet activity   Assist Walk 150 feet activity did not occur: Safety/medical concerns  Assist level: Supervision/Verbal cueing Assistive device: Rollator    Walk 10 feet on uneven surface  activity   Assist Walk 10 feet on uneven surfaces activity did not occur: Safety/medical concerns         Wheelchair     Assist Will patient use wheelchair at discharge?: No Type of Wheelchair: Manual    Wheelchair assist level: Supervision/Verbal cueing Max wheelchair distance: 140    Wheelchair 50 feet with 2 turns activity    Assist        Assist Level: Supervision/Verbal cueing   Wheelchair 150 feet  activity     Assist      Assist Level: Maximal Assistance - Patient 25 - 49%   Blood pressure 110/77, pulse 74, temperature (!) 97.5 F (36.4 C), temperature source Oral, resp. rate 18, height $RemoveBe'5\' 10"'itCXueFwC$  (1.778 m), weight 66.5 kg, SpO2 100 %.  Medical Problem List and Plan: 1.  Debility Continue CIR   2.  L-gastroc DVT: continue Eliquis. No thrombus seen on dopplers 8/6. There are swollen groin LN bilaterally- this could be a response to infection.              -antiplatelet therapy: N/A 3. Pain from bilateral lower extremity swelling:  Continue Oxycodone prn- advised to use $Remo'10mg'erRwB$  rather than $RemoveBef'15mg'RIFtvGctFC$  for moderate pain.  --Robaxin 1000 mg tid Ice/elevation Cr stable on Lasix- continue low dose supplementation. See #11 Added voltaren gel scheduled for right knee List of foods that can help with pain.  Discontinued dilaudid.  Controlled on 8/18 4. Mood: LCSW to follow for evaluation and support.             -antipsychotic agents: N/AA 5. Neuropsych: This patient is capable of making decisions on his own behalf. 6. Abdominal incisions (3) and colostomy: Damp to dry dressing changes twice daily. Please initiate education of wife on dressing changes. Will try to get home health aide to assist him. Wife has questions regarding this- I have left a voicemail with her today to discuss.  7. Fluids/Electrolytes/Nutrition: Monitor intake/output.   -Continue Juven twice daily.   -Continue vitamin C and zinc to help promote wound healing.  8. Wound infection: Has completed 9. Hyponatremia: Continue 1800 cc/FR  Improving.  10. Acute blood loss anemia: Monitor with serial checks.Hgb reviewed; improved to 10.6 on 8/15 11.  Polyarthralgias, gout with effusion: Continue Celebrex daily.  Good results from ortho aspiration. Continue Lasix $RemoveBefore'20mg'bukjlosIdAFnX$  daily. Discussed with patient that ESR and CRP are trending downward.  -Cr and K+ stable, discontinue Lasix 12. New diagnosis T2DM: Hgb A1C-7.4.               --monitor BS ac/hs and use SSI for elevated BS.             --consulted RD for dietary education.  --provided with list of foods that are good for diabetes.   -d/c Boost since and use Ensure Max instead.   -educated that control has been better, discussed avoidance of juice. Educated that recent spike is due to steroids.   Appears to be stabilizing and elevated, will consider medication initiation if persistent  LOS: 16 days A FACE TO FACE EVALUATION WAS PERFORMED  Roger Brennan Kristol Almanzar 09/17/2020, 10:48 AM

## 2020-09-18 LAB — GLUCOSE, CAPILLARY
Glucose-Capillary: 126 mg/dL — ABNORMAL HIGH (ref 70–99)
Glucose-Capillary: 94 mg/dL (ref 70–99)
Glucose-Capillary: 156 mg/dL — ABNORMAL HIGH (ref 70–99)
Glucose-Capillary: 163 mg/dL — ABNORMAL HIGH (ref 70–99)

## 2020-09-18 MED ORDER — OXYCODONE HCL 5 MG PO TABS
5.0000 mg | ORAL_TABLET | ORAL | Status: DC | PRN
Start: 2020-09-18 — End: 2020-09-22
  Administered 2020-09-18 – 2020-09-22 (×9): 10 mg via ORAL
  Filled 2020-09-18 (×9): qty 2

## 2020-09-18 NOTE — Progress Notes (Signed)
Physical Therapy Session Note  Patient Details  Name: Roger Brennan MRN: [id number] Date of Birth: 29-Jan-1961  Today's Date: 09/18/2020 PT Individual Time: 1500-1526 PT Individual Time Calculation (min): 26 min   Short Term Goals: Week 2:  PT Short Term Goal 1 (Week 2): pt will perform sit<>stand transfers with min A consistantly PT Short Term Goal 1 - Progress (Week 2): Met PT Short Term Goal 2 (Week 2): Pt will initiate stair training PT Short Term Goal 2 - Progress (Week 2): Met PT Short Term Goal 3 (Week 2): Pt will improve RLE ROM to 75+ flexion PT Short Term Goal 3 - Progress (Week 2): Met Week 3:  PT Short Term Goal 1 (Week 3): STG=LTG due to LOS  Skilled Therapeutic Interventions/Progress Updates:   Received pt sitting EOB, pt agreeable to PT treatment, and did not report any pain during session. Session with emphasis on functional mobility, generalized strengthening, toileting, stair navigation, and improved endurance with activity. Pt reported urge to use restroom and ambulated in/out of bathroom with rollator and supervision and able to void and perform all toileting tasks with supervision. Pt ambulated 12ft with rollator and supervision to therapy gym and navigated 12 steps with 2 rails and supervision. Ambulated additional 117ft with rollator and supervision to dayroom and performed standing BLE strengthening on Kinetron at 20 cm/sec for 1 minute x 2 trials with BUE support and emphasis on glute/quad strengthening. Pt ambulated >361ft with rollator and supervision back to room. Concluded session with pt sitting EOB with all needs within reach.   Therapy Documentation Precautions:  Precautions Precautions: Fall Precaution Comments: colostomy, bandages over full abdomen, R knee ROM limitations Restrictions Weight Bearing Restrictions: No  Therapy/Group: Individual Therapy Elye Harmsen PT, DPT   09/18/2020, 7:35 AM

## 2020-09-18 NOTE — Progress Notes (Signed)
Occupational Therapy Session Note  Patient Details  Name: Roger Brennan MRN: [id number] Date of Birth: [birth year]  Today's Date: 09/18/2020 OT Individual Time: 1300-1330 OT Individual Time Calculation (min): 30 min    Short Term Goals: Week 3:  OT Short Term Goal 1 (Week 3): STG = LTGs due to remaining LOS  Skilled Therapeutic Interventions/Progress Updates:  Pt greeted seated EOB agreeable to OT intervention. Pt completed functional ambulation to therapy gym with rollator and gross supervision. Pt completed various therapeutic activities to facilitate improved dynamic standing balance. Pt completed ~ 7 mins of badminton with gross supervision, one small LOB when reaching out of BOS, MIN A to recover. Pt additionally completed standing ball tosses to rebounded trampoline with pt standing on compliant mat with gross supervision. Graded task up by having pt aim to target on trampoline, pt completed task with gross supervision. Lastly pt completed standing dynamic balance with standing on balance board. Pt instructed to replicate lego structure with from visual aid provided, CGA provided for standing balance. Pt completed functional ambulation back to room with rollator and gross supervision. Pt left seated EOB with all needs within reach.   Therapy Documentation Precautions:  Precautions Precautions: Fall Precaution Comments: colostomy Restrictions Weight Bearing Restrictions: No  Pain: Pt reports no pain during session.    Therapy/Group: Individual Therapy   Corinne Ports [facility] 09/18/2020, 3:03 PM

## 2020-09-18 NOTE — Consult Note (Signed)
Roger Brennan Nurse ostomy follow up Patient receiving care in [phone] Wife Roger Brennan requesting to see the Ostomy nurse because she has questions. She and the patient seem to be confused about using a 1 p or 2 pc pouch. Patient currently has a 2 pc opaque pouch on that has no window. Patient states no supplies were brought to the room and it needed to be changed so the nurse used a pouch that Roger Brennan had brought from home. My colleague Roger Brennan had called and spoke to [name] on 8/8 and gave her Roger Brennan #s for supplies to order for the patient and included those numbers in her note. These lawson numbers are also in the Ostomy orders for [name]. The wife is requesting that we order some 2 pc and 1 pc pouches to see which one they like better. Pouch was not changed today as it was changed yesterday. I will see them again on Monday. The following Roger Brennan numbers were given to the secretary on 4W to order and put in the patient room.  [name] # [address] # [address] [place] # [phone] Roger Brennan # [name] Roger Julian, MSN, RN, [name], Roger Brennan, [name] Wound Treatment Associate Pager [phone]

## 2020-09-18 NOTE — Progress Notes (Addendum)
Patient ID: Roger Brennan, male   DOB: 12-Jan-1961, 60 y.o.   MRN: [id number]  Attempted to call nancy-Evicore regarding if found [facility] agency to take referral and was unable to reach her or leave a message. They are having technical difficulities. Will continue to try to reach her to find out status of finding [place] for pt at discharge. Will reach out to [employer] CM to see what she wants to do. Spoke with nancy-Evicore who reports still no home health agency found on last in network provider than the other option is looking for out of network providers willing to accept. Did speak with [employer] CM to update her of this. She needed additional clinicals and to put on form no provider found yet for home health. Have asked Larene [place] to sent these. Wife to be in this afternoon to do some teaching regarding wound care.  1:00 PM Updated wife regarding [employer] and [employer] plan being continue to search for home health agency to accept and staying here until all exhausted by Evicore.  2:45 PM Have reached out to [place] care to see if will take referral-per wife's request. Told by Evicore they decline his referral. Will await response from [place] liaison  3:37 PM Spoke with nancy-Evicore the last home health checked with declined taking him. May look for out of network providers. [facility] declined also. Will let pt and wife know. RN attempted to show wife and have do dressing change and pt would not let her. Discussed someone will need to be educated and do his dressing change at discharge . The wound clinic appointment is not until 9/28.Talk with Monday

## 2020-09-18 NOTE — Progress Notes (Signed)
Physical Therapy Discharge Summary  Patient Details  Name: Roger Brennan MRN: [id number] Date of Birth: 03-06-1960  Patient has met 10 of 10 long term goals due to improved activity tolerance, improved balance, increased strength, increased range of motion, decreased pain, and improved coordination. Patient to discharge at an ambulatory level Modified Independent.  Patient's care partner is independent to provide the necessary physical assistance at discharge. Pt's wife did not attend family education training, however pt to discharge at a Mod I level overall and has verbalized and demonstrated confidence with all tasks to ensure safe discharge home.   All goals met   Recommendation:  Patient will benefit from ongoing skilled PT services in outpatient setting to continue to advance safe functional mobility, address ongoing impairments in generalized strengthening, dynamic standing balance/coordination, gait training, NMR, endurance, and to minimize fall risk.  Equipment: rollator  Reasons for discharge: treatment goals met  Patient/family agrees with progress made and goals achieved: Yes  PT Discharge Precautions/Restrictions Precautions Precautions: Fall Precaution Comments: colostomy Restrictions Weight Bearing Restrictions: No Cognition Overall Cognitive Status: Within Functional Limits for tasks assessed Arousal/Alertness: Awake/alert Orientation Level: Oriented X4 Memory: Appears intact Awareness: Appears intact Problem Solving: Appears intact Safety/Judgment: Appears intact Sensation Sensation Light Touch: Appears Intact Proprioception: Appears Intact Coordination Gross Motor Movements are Fluid and Coordinated: Yes Fine Motor Movements are Fluid and Coordinated: Yes Coordination and Movement Description: generalized weakness and deconditioning Finger Nose Finger Test: WFL bilaterally Heel Shin Test: [facility] bilaterally Motor  Motor Motor: Within Functional  Limits Motor - Skilled Clinical Observations: generalized weakness and deconditioning  Mobility Bed Mobility Bed Mobility: Rolling Right;Rolling Left;Sit to Supine;Supine to Sit Rolling Right: Independent Rolling Left: Independent Supine to Sit: Independent Sit to Supine: Independent Transfers Transfers: Sit to Stand;Stand to Sit;Stand Pivot Transfers Sit to Stand: Independent with assistive device Stand to Sit: Independent with assistive device Stand Pivot Transfers: Independent with assistive device Stand Pivot Transfer Details (indicate cue type and reason): none Transfer (Assistive device): Rollator Locomotion  Gait Ambulation: Yes Gait Assistance: Independent with assistive device Gait Distance (Feet): 150 Feet Assistive device: Rollator Gait Assistance Details: none Gait Gait: Yes Gait Pattern: Impaired Gait Pattern: Decreased step length - right;Decreased step length - left;Step-through pattern;Poor foot clearance - right;Poor foot clearance - left Gait velocity: decreased Stairs / Additional Locomotion Stairs: Yes Stairs Assistance: Independent with assistive device Stair Management Technique: Two rails Number of Stairs: 12 Height of Stairs: 6 Ramp: Supervision/Verbal cueing (rollator) Wheelchair Mobility Wheelchair Mobility: No  Trunk/Postural Assessment  Cervical Assessment Cervical Assessment: Exceptions to [facility] (cervical kyphosis) Thoracic Assessment Thoracic Assessment: Exceptions to [facility] (thoracic kyphosis) Lumbar Assessment Lumbar Assessment: Exceptions to [facility] (mild posterior pelvic tilt) Postural Control Postural Control: Deficits on evaluation  Balance Balance Balance Assessed: Yes Static Sitting Balance Static Sitting - Balance Support: Feet supported;No upper extremity supported Static Sitting - Level of Assistance: 7: Independent Dynamic Sitting Balance Dynamic Sitting - Balance Support: Feet supported;No upper extremity supported Dynamic  Sitting - Level of Assistance: 7: Independent Static Standing Balance Static Standing - Balance Support: Bilateral upper extremity supported (rollator) Static Standing - Level of Assistance: 6: Modified independent (Device/Increase time) Dynamic Standing Balance Dynamic Standing - Balance Support: Bilateral upper extremity supported (rollator) Dynamic Standing - Level of Assistance: 6: Modified independent (Device/Increase time) Extremity Assessment  RLE Assessment RLE Assessment: Exceptions to [facility] General Strength Comments: grossly generalized to 4+/5 (except knee flexion 4/5) LLE Assessment LLE Assessment: Exceptions to [facility] General Strength Comments: grossly generalized to 4+/5 (  except knee flexion 4/5)  Grenelefe, DPT  09/18/2020, 7:39 AM

## 2020-09-18 NOTE — Progress Notes (Signed)
Patient ID: Roger Brennan, male   DOB: May 18, 1960, 60 y.o.   MRN: [id number] Follow up on phone call received from wife. Expressed concerns about care at discharge including ostomy and abd incision. HH follow up services still in the air and lack of knowledge about dressing changes lending to anxiety about pending discharge. WOC asked to come by to tie up instructions and nursing to review hands on return demo with abd incision dressing with the wife this afternoon. Awaiting information from insurance/SW regarding confirmation of [place] services for discharge. Margarito Liner

## 2020-09-18 NOTE — Progress Notes (Signed)
PROGRESS NOTE   Subjective/Complaints: Discussed decreasing oxycodone to 78m for moderate and 112mfor severe pain and he is agreeable. His knee pain and swelling are much improved He has no other complaints.   ROS: Denies CP, SOB, N/V/D, left knee pain much improved  Objective:   No results found. No results for input(s): WBC, HGB, HCT, PLT in the last 72 hours.    No results for input(s): NA, K, CL, CO2, GLUCOSE, BUN, CREATININE, CALCIUM in the last 72 hours.   Intake/Output Summary (Last 24 hours) at 09/18/2020 1109 Last data filed at 09/18/2020 0952 Gross per 24 hour  Intake 700 ml  Output 1225 ml  Net -525 ml         Physical Exam: Vital Signs Blood pressure 110/71, pulse 84, temperature 97.6 F (36.4 C), temperature source Oral, resp. rate 20, height _0  (1.778 m), weight 66.5 kg, SpO2 100 %. Gen: no distress, normal appearing HEENT: oral mucosa pink and moist, NCAT Cardio: Reg rate Chest: normal effort, normal rate of breathing  GI: Non-distended.  BS +.  + Ostomy. Skin: Warm and dry.  Bilateral abdominal wound with good granulation tissue, some fibrinous tissue noted in distal right wound bed. Right stage 2 pressure injury to right buttock Psych: Normal mood.  Normal behavior. Musc: No edema in extremities.  No tenderness in extremities. Neuro: Alert and oriented Motor: Grossly 4-4+/5 throughout  Assessment/Plan: 1. Functional deficits which require 3+ hours per day of interdisciplinary therapy in a comprehensive inpatient rehab setting. Physiatrist is providing close team supervision and 24 hour management of active medical problems listed below. Physiatrist and rehab team continue to assess barriers to discharge/monitor patient progress toward functional and medical goals  Care Tool:  Bathing    Body parts bathed by patient: Right arm, Left arm, Right upper leg, Left upper leg, Face, Right lower  leg, Left lower leg, Front perineal area   Body parts bathed by helper: Right lower leg, Left lower leg, Buttocks     Bathing assist Assist Level: Supervision/Verbal cueing     Upper Body Dressing/Undressing Upper body dressing   What is the patient wearing?: Pull over shirt    Upper body assist Assist Level: Independent with assistive device    Lower Body Dressing/Undressing Lower body dressing      What is the patient wearing?: Pants     Lower body assist Assist for lower body dressing: Supervision/Verbal cueing     Toileting Toileting    Toileting assist Assist for toileting: Supervision/Verbal cueing Assistive Device Comment: Urinal   Transfers Chair/bed transfer  Transfers assist     Chair/bed transfer assist level: Supervision/Verbal cueing     Locomotion Ambulation   Ambulation assist      Assist level: Supervision/Verbal cueing Assistive device: Rollator Max distance: >15036f Walk 10 feet activity   Assist     Assist level: Supervision/Verbal cueing Assistive device: Rollator   Walk 50 feet activity   Assist Walk 50 feet with 2 turns activity did not occur: Safety/medical concerns  Assist level: Supervision/Verbal cueing Assistive device: Rollator    Walk 150 feet activity   Assist Walk 150 feet activity did  not occur: Safety/medical concerns  Assist level: Supervision/Verbal cueing Assistive device: Rollator    Walk 10 feet on uneven surface  activity   Assist Walk 10 feet on uneven surfaces activity did not occur: Safety/medical concerns         Wheelchair     Assist Will patient use wheelchair at discharge?: No Type of Wheelchair: Manual    Wheelchair assist level: Supervision/Verbal cueing Max wheelchair distance: 140    Wheelchair 50 feet with 2 turns activity    Assist        Assist Level: Supervision/Verbal cueing   Wheelchair 150 feet activity     Assist      Assist Level: Maximal  Assistance - Patient 25 - 49%   Blood pressure 110/71, pulse 84, temperature 97.6 F (36.4 C), temperature source Oral, resp. rate 20, height _0  (1.778 m), weight 66.5 kg, SpO2 100 %.  Medical Problem List and Plan: 1.  Debility Continue CIR   2.  L-gastroc DVT: continue Eliquis. No thrombus seen on dopplers 8/6. There are swollen groin LN bilaterally- this could be a response to infection.              -antiplatelet therapy: N/A 3. Pain from bilateral lower extremity swelling:  Continue Oxycodone prn- decreased to 62m for moderate pain and 120mfor severe pain --Robaxin 1000 mg tid Ice/elevation Cr stable on Lasix- continue low dose supplementation. See #11 Added voltaren gel scheduled for right knee List of foods that can help with pain.  Discontinued dilaudid.  Controlled on 8/18=9 4. Mood: LCSW to follow for evaluation and support.             -antipsychotic agents: N/AA 5. Neuropsych: This patient is capable of making decisions on his own behalf. 6. Abdominal incisions (3) and colostomy: Damp to dry dressing changes twice daily. Please initiate education of wife on dressing changes. Will try to get home health aide to assist him. Wife has questions regarding this- I have left a voicemail with her today to discuss. Discussed provate pay options 7. Fluids/Electrolytes/Nutrition: Monitor intake/output.   -Continue Juven twice daily.   -Continue vitamin C and zinc to help promote wound healing.  8. Wound infection: Has completed 9. Hyponatremia: Remove fluid restriction  Improving.  10. Acute blood loss anemia: Monitor with serial checks.Hgb reviewed; improved to 10.6 on 8/15 11.  Polyarthralgias, gout with effusion: Continue Celebrex daily.  Good results from ortho aspiration. Continue Lasix 2027maily. Discussed with patient that ESR and CRP are trending downward.  -Cr and K+ stable, discontinue Lasix 12. New diagnosis T2DM: Hgb A1C-7.4.              --monitor BS ac/hs and use  SSI for elevated BS.             --consulted RD for dietary education.  --provided with list of foods that are good for diabetes.   -d/c Boost since and use Ensure Max instead.   -educated that control has been better, discussed avoidance of juice. Educated that recent spike is due to steroids.   Appears to be stabilizing and elevated, will consider medication initiation if persistent  LOS: 17 days A FACE TO FACE EVALUATION WAS PERFORMED  Shamicka Inga P Tylie Golonka 09/18/2020, 11:09 AM

## 2020-09-18 NOTE — Progress Notes (Signed)
Spoke with patient and spouse regarding abdominal wound dressing changes. Pt and spouse opted to observe today and perform dressing changes over the weekend. Pt states he is very familiar with it and know what to do. Encouraged pt and spouse to still practice with staff, so that their questions can be answered.   Gerald Stabs, RN

## 2020-09-18 NOTE — Progress Notes (Signed)
Physical Therapy Session Note  Patient Details  Name: Roger Brennan MRN: [id number] Date of Birth: 1960/07/31  Today's Date: 09/18/2020 PT Individual Time: 9983-3825 PT Individual Time Calculation (min): 25 min   Short Term Goals: Week 3:  PT Short Term Goal 1 (Week 3): STG=LTG due to LOS  Skilled Therapeutic Interventions/Progress Updates: Pt presented sitting EOB agreeable to therapy. Pt indicates woke up with pain at R ankle, thinks it's due to increased activity and higher level balance activities. PTA inspected R ankle no edema noted but TTP posterior lateral malleolus, per pt 5/10 with wt bearing activities. Performed Sit to stand and ambulation with rollator with supervision overall. Participated in floor transfer with supervision overall. Pt was able to perform transfer without use of high/low mat but verbalized understanding could use if needed. Pt then ambulated to ortho gym and participated in NuStep L4-5 x8 minutes for ROM and general conditioning. Pt ambulated back to room in same manner as prior and returned to sitting EOB. Pt left in room with bed alarm on, call bell within reach and needs met.        Therapy Documentation Precautions:  Precautions Precautions: Fall Precaution Comments: colostomy Restrictions Weight Bearing Restrictions: No General:   Vital Signs: Therapy Vitals Temp: 97.8 F (36.6 C) Temp Source: Oral Pulse Rate: 89 Resp: 16 BP: 108/84 Patient Position (if appropriate): Sitting Oxygen Therapy SpO2: 100 % O2 Device: Room Air Pain: Pain Assessment Pain Scale: Faces Faces Pain Scale: No hurt Pain Type: Acute pain Pain Location: Ankle Pain Orientation: Right Pain Descriptors / Indicators: Discomfort Pain Onset: With Activity Pain Intervention(s): Repositioned;Emotional support Mobility:   Locomotion : Gait Ambulation: Yes Gait Assistance: Independent with assistive device Gait Distance (Feet): 150 Feet Assistive device: Rollator Gait  Assistance Details: none Gait Gait: Yes Gait Pattern: Impaired Gait Pattern: Decreased step length - right;Decreased step length - left;Step-through pattern;Poor foot clearance - right;Poor foot clearance - left Gait velocity: decreased Stairs / Additional Locomotion Stairs: Yes Stairs Assistance: Independent with assistive device Stair Management Technique: Two rails Number of Stairs: 12 Height of Stairs: 6 Ramp: Supervision/Verbal cueing (rollator) Wheelchair Mobility Wheelchair Mobility: No  Trunk/Postural Assessment :    Balance:   Exercises:   Other Treatments:      Therapy/Group: Individual Therapy  Roger Brennan 09/18/2020, 4:04 PM

## 2020-09-18 NOTE — Progress Notes (Signed)
Occupational Therapy Session Note  Patient Details  Name: Roger Brennan MRN: [id number] Date of Birth: February 08, 1960  Today's Date: 09/18/2020 OT Individual Time: [id number] OT Individual Time Calculation (min): 54 min    Short Term Goals: Week 3:  OT Short Term Goal 1 (Week 3): STG = LTGs due to remaining LOS  Skilled Therapeutic Interventions/Progress Updates:    Pt completed functional mobility throughout session as well as in the hallway with supervision and no device.  He was able to begin by gathering towel and washcloth for washing his face at the sink with completion of oral hygiene in standing as well.  He ambulated to the dayroom without assistive device when given the choice to use the rollator or not.  Slight limp noted secondary to report of right ankle pain, but no severe enough that he felt the need to use the rollator.  Once in the dayroom spent session with use of the Wii balance board to work on hip and ankle strategies for improved balance reactions.  He was able to step on and off of the board as well as maintain his balance on it at supervision level.  He completed several 4-5 min intervals without significant SOB or an increase in his pain.  Returned to the room at the end of the session via mobility with supervision where he was left sitting EOB with bed alarm in place and his spouse present.  Call button and phone in reach as well.    Therapy Documentation Precautions:  Precautions Precautions: Fall Precaution Comments: colostomy Restrictions Weight Bearing Restrictions: No   Pain: Pain Assessment Pain Scale: Faces Faces Pain Scale: Hurts a little bit Pain Type: Acute pain Pain Location: Ankle Pain Orientation: Right Pain Descriptors / Indicators: Discomfort Pain Onset: With Activity Pain Intervention(s): Repositioned;Emotional support ADL: See Care Tool Section for some details of mobility and selfcare  Therapy/Group: Individual Therapy  Kimmy Parish  OTR/L 09/18/2020, 12:35 PM

## 2020-09-19 LAB — GLUCOSE, CAPILLARY
Glucose-Capillary: 137 mg/dL — ABNORMAL HIGH (ref 70–99)
Glucose-Capillary: 101 mg/dL — ABNORMAL HIGH (ref 70–99)
Glucose-Capillary: 170 mg/dL — ABNORMAL HIGH (ref 70–99)
Glucose-Capillary: 162 mg/dL — ABNORMAL HIGH (ref 70–99)

## 2020-09-19 NOTE — Plan of Care (Signed)
  Problem: RH BOWEL ELIMINATION Goal: RH STG MANAGE BOWEL WITH ASSISTANCE Description: STG Manage Bowel with  mod I Assistance. Outcome: Progressing; patient expressed how to burp and empty his colostomy   Problem: RH SKIN INTEGRITY Goal: RH STG ABLE TO PERFORM INCISION/WOUND CARE W/ASSISTANCE Description: STG Able To Perform Incision/Wound Care With min  Assistance. Outcome: Progressing; patient expressed how he was following to do his wound care more; he said the wound dressing changes is consistent now and he's able to follow   Problem: Education: Goal: Knowledge of ostomy care will improve Description: Patient and wife will be able to manage ostomy with min assist using handouts and educational resources Outcome: Progressing

## 2020-09-19 NOTE — Progress Notes (Signed)
PROGRESS NOTE   Subjective/Complaints: No pain issues , learning how to change ostomy   ROS: Denies CP, SOB, N/V/D, left knee pain much improved  Objective:   No results found. No results for input(s): WBC, HGB, HCT, PLT in the last 72 hours.    No results for input(s): NA, K, CL, CO2, GLUCOSE, BUN, CREATININE, CALCIUM in the last 72 hours.   Intake/Output Summary (Last 24 hours) at 09/19/2020 0817 Last data filed at 09/19/2020 0726 Gross per 24 hour  Intake --  Output 2750 ml  Net -2750 ml          Physical Exam: Vital Signs Blood pressure 123/81, pulse 77, temperature 97.7 F (36.5 C), temperature source Oral, resp. rate 17, height $RemoveBe'5\' 10"'vjTyVKGZX$  (1.778 m), weight 66.5 kg, SpO2 100 %.  General: No acute distress Mood and affect are appropriate Heart: Regular rate and rhythm no rubs murmurs or extra sounds Lungs: Clear to auscultation, breathing unlabored, no rales or wheezes Abdomen: Positive bowel sounds, soft nontender to palpation, nondistended Extremities: No clubbing, cyanosis, or edema  GI: Non-distended.  BS +.  + Ostomy. Skin: Warm and dry.  Bilateral abdominal wound with good granulation tissue, some fibrinous tissue noted in distal right wound bed. Right stage 2 pressure injury to right buttock Psych: Normal mood.  Normal behavior. Musc: No edema in extremities.  No tenderness in extremities. Neuro: Alert and oriented Motor: Grossly 4-4+/5 throughout  Assessment/Plan: 1. Functional deficits which require 3+ hours per day of interdisciplinary therapy in a comprehensive inpatient rehab setting. Physiatrist is providing close team supervision and 24 hour management of active medical problems listed below. Physiatrist and rehab team continue to assess barriers to discharge/monitor patient progress toward functional and medical goals  Care Tool:  Bathing    Body parts bathed by patient: Face   Body parts  bathed by helper: Right lower leg, Left lower leg, Buttocks     Bathing assist Assist Level: Supervision/Verbal cueing     Upper Body Dressing/Undressing Upper body dressing   What is the patient wearing?: Pull over shirt    Upper body assist Assist Level: Independent with assistive device    Lower Body Dressing/Undressing Lower body dressing      What is the patient wearing?: Pants     Lower body assist Assist for lower body dressing: Supervision/Verbal cueing     Toileting Toileting    Toileting assist Assist for toileting: Supervision/Verbal cueing Assistive Device Comment: Urinal   Transfers Chair/bed transfer  Transfers assist     Chair/bed transfer assist level: Supervision/Verbal cueing     Locomotion Ambulation   Ambulation assist      Assist level: Supervision/Verbal cueing Assistive device: Rollator Max distance: 150'   Walk 10 feet activity   Assist     Assist level: Supervision/Verbal cueing Assistive device: Rollator   Walk 50 feet activity   Assist Walk 50 feet with 2 turns activity did not occur: Safety/medical concerns  Assist level: Supervision/Verbal cueing Assistive device: Rollator    Walk 150 feet activity   Assist Walk 150 feet activity did not occur: Safety/medical concerns  Assist level: Supervision/Verbal cueing Assistive device: Rollator  Walk 10 feet on uneven surface  activity   Assist Walk 10 feet on uneven surfaces activity did not occur: Safety/medical concerns         Wheelchair     Assist Will patient use wheelchair at discharge?: No Type of Wheelchair: Manual    Wheelchair assist level: Supervision/Verbal cueing Max wheelchair distance: 140    Wheelchair 50 feet with 2 turns activity    Assist        Assist Level: Supervision/Verbal cueing   Wheelchair 150 feet activity     Assist      Assist Level: Maximal Assistance - Patient 25 - 49%   Blood pressure 123/81,  pulse 77, temperature 97.7 F (36.5 C), temperature source Oral, resp. rate 17, height $RemoveBe'5\' 10"'vVJfrKKDH$  (1.778 m), weight 66.5 kg, SpO2 100 %.  Medical Problem List and Plan: 1.  Debility Continue CIR   2.  L-gastroc DVT: continue Eliquis. No thrombus seen on dopplers 8/6. There are swollen groin LN bilaterally- this could be a response to infection.              -antiplatelet therapy: N/A 3. Pain from bilateral lower extremity swelling:  Continue Oxycodone prn- decreased to $RemoveBefo'5mg'KnvqPjscVtf$  for moderate pain and $Remove'10mg'JxRCSWz$  for severe pain --Robaxin 1000 mg tid Ice/elevation Cr stable on Lasix- continue low dose supplementation. See #11 Added voltaren gel scheduled for right knee List of foods that can help with pain.  Discontinued dilaudid.  Controlled on 8/20 4. Mood: LCSW to follow for evaluation and support.             -antipsychotic agents: N/AA 5. Neuropsych: This patient is capable of making decisions on his own behalf. 6. Abdominal incisions (3) and colostomy: Damp to dry dressing changes twice daily. Please initiate education of wife on dressing changes. Will try to get home health aide to assist him. Wife has questions regarding this- I have left a voicemail with her today to discuss. Discussed provate pay options 7. Fluids/Electrolytes/Nutrition: Monitor intake/output.   -Continue Juven twice daily.   -Continue vitamin C and zinc to help promote wound healing.  8. Wound infection: Has completed 9. Hyponatremia: Remove fluid restriction  Improving.  10. Acute blood loss anemia: Monitor with serial checks.Hgb reviewed; improved to 10.6 on 8/15 11.  Polyarthralgias, gout with effusion: Resolved Continue Celebrex daily.  Good results from ortho aspiration.  Discussed with patient that ESR and CRP are trending downward.   12. New diagnosis T2DM: Hgb A1C-7.4.              --monitor BS ac/hs and use SSI for elevated BS.             --consulted RD for dietary education.  --provided with list of foods that are  good for diabetes.   -d/c Boost since and use Ensure Max instead.   -educated that control has been better, discussed avoidance of juice. Educated that recent spike is due to steroids.   Appears to be stabilizing and elevated, will consider medication initiation if persistent CBG (last 3)  Recent Labs    09/18/20 1615 09/18/20 2102 09/19/20 0535  GLUCAP 156* 163* 137*    LOS: 18 days A FACE TO [place] E Tymesha Ditmore 09/19/2020, 8:17 AM

## 2020-09-19 NOTE — Progress Notes (Signed)
Physical Therapy Session Note  Patient Details  Name: Roger Brennan MRN: [id number] Date of Birth: 04-17-1960  Today's Date: 09/19/2020 PT Individual Time:Session1: [id number]; Session2: 1300-1400 PT Individual Time Calculation (min): 23 min & 60 min  Short Term Goals: Week 3:  PT Short Term Goal 1 (Week 3): STG=LTG due to LOS  Skilled Therapeutic Interventions/Progress Updates:    Session1:  Patient seated on EOB and reports stiffness.  Patient sit to stand with rollator with S and ambulated to ortho gym.  Seated on Nu Step performed UE/LE at level 5 x 6 min.  Seated for scapular squeezes and trunk extensions x 10 sec hold on EOM.  Quadruped hip extension x 5 each leg with 5 sec hold.  Performed standing trunk rotation with arms moving diagonal from up to R and down to L, then reverse x 5 each way.  Arms up overhead feet in stride facing R then switching feet facing L x 5 each way.  All performed without UE support.  Patient ambulated to room with rollator and S.  Left seated EOB with needs in reach.    Session2: Patient requesting to toilet.  Ambulated with rollator to bathroom.  Stood to void with distant S.  Patient ambulated with rollator with S to therapy gym.  Performed standing badminton with distant S no UE support occasionally reaching to floor to retrieve birdie.  Standing balance bouncing waffle ball on tennis racket no LOB, then on foam wide BOS, then with narrow BOS.  Patient ambulated with rollator outdoors on uneven paved surfaces no LOB over 500' prior to seated rest. Patient returned to unit.  Performed tandem gait with HHA, then side stepping with squat both ways, then crossing over both ways.  Returned to room with rollator.  To supine with S.  Placed ice pack on R ankle and left with call [place] needs in reach.  Therapy Documentation Precautions:  Precautions Precautions: Fall Precaution Comments: colostomy Restrictions Weight Bearing Restrictions: No  Pain: Pain  Assessment Pain Scale: 0-10 Pain Score: 3 Pain Type: Acute pain Pain Location: Ankle Pain Orientation: Right Pain Descriptors / Indicators: Aching Pain Onset: On-going Pain Intervention(s): Ambulation/increased activity    Therapy/Group: Individual Therapy  Reginia Naas [place], PT 09/19/2020, 8:26 AM

## 2020-09-20 LAB — GLUCOSE, CAPILLARY
Glucose-Capillary: 131 mg/dL — ABNORMAL HIGH (ref 70–99)
Glucose-Capillary: 89 mg/dL (ref 70–99)
Glucose-Capillary: 145 mg/dL — ABNORMAL HIGH (ref 70–99)
Glucose-Capillary: 157 mg/dL — ABNORMAL HIGH (ref 70–99)

## 2020-09-20 NOTE — Progress Notes (Signed)
Nutrition Follow-up  DOCUMENTATION CODES:   Non-severe (moderate) malnutrition in context of acute illness/injury  INTERVENTION:   Continue Ensure Max po BID, each supplement provides 150 kcal and 30 grams of protein.    Continue Juven BID, each packet provides 95 calories, 2.5 grams of protein.  Continue MVI with minerals daily   Encourage adequate PO intake  Provided "Colostomy Nutrition Therapy" handout from [facility] Nutrition Care Manual; attached to AVS/ discharge summary  NUTRITION DIAGNOSIS:   Moderate Malnutrition related to acute illness as evidenced by mild fat depletion, mild muscle depletion.  Ongoing  GOAL:   Patient will meet greater than or equal to 90% of their needs  Progressing  MONITOR:   PO intake, Supplement acceptance, Skin, Weight trends, Labs, I & O's  REASON FOR ASSESSMENT:   Consult Diet education  ASSESSMENT:   60 year old male with history of HTN in the past otherwise in good health who started having issues with constipation since early June with decreased appetite, 20 lbs wt loss, abdominal pain progressing to intermittent fevers X few weeks. He was admitted on 08/12/20 to [facility] with abdominal pain due to diverticular perforation with  11.5 X 5 cm abscess and fistulization to abdominal wall. He was taken to OR emergently for Exp lap with debridement of LLQ wall, splenic mobilization and  partial colectomy with end colostomy by Dr. Georgiana Spinner.  Blood cultures negative and UCS showed E Coli.  He was placed on broad-spectrum antibiotics but continued to have upward rise in white count to 41,000 as well as confusion.     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    He was made n.p.o. with TNA for nutritional support.  He was treated with 7-day course of Eraxis and Zosyn x14 days.  Pathology showed evidence of diverticulitis without malignancy.  Acute blood loss anemia treated with transfusion and reactive leukocytosis has been resolving.     He developed acute DVT in right gastrocnemius vein as well as distal R- axillary DVT and surrounding PICC and SVT in basilic vein R antecubital fossa.  Significant Events: 7/13- admission with dx of abdominal wall abscess, necrotizing soft tissue of abdominal wall, perforated diverticulitis with colocutaneous fistula; I&D LLQ abdominal wall, partial colectomy and end colostomy, and splenic mobilization; NGT placement 7/15- initial RD assessment; exploration for concern of leak and inspection of L flank, LOAs, SBR with anastomosis, enterorrhaphy x2, debridement of necrotic fat and fascia of abdominal wall 7/16- placement of double lumen PICC in R basilic; debridement of abdominal wall with sharp dissection and pulsavac of wounds 7/18- NGT removal; diet advanced to CLD 7/19- diet advanced to FLD 7/20- diet advanced to Soft 7/21- TPN stopped  Reviewed I/O's: -1.8 L x 24 hours and -16.3 L since 09/06/20  UOP: 2.3 L x 24 hours   Colostomy output: 250 ml x 24 hours   Pt unavailable at time of visit. Attempted to speak with pt via call to hospital room phone, however, unable to reach.   Pt's oral intake has improved since last visit. Noted meal completion 85-100%. Pt has been taking Ensure Max and Juven supplements.  Per chart review, pt and wife  with multiple questions regarding ostomy and wound care. CWOCN and beside RNs providing ongoing education.   Medications reviewed and include vitamin C, depo-medrol, zinc sulfate, and miralax.   Labs reviewed: CBGS: 101-170 (inpatient orders for glycemic control are 0-5 units insulin aspart daily at bedtime and 0-9 units insulin aspart  TID with meals).    Diet Order:   Diet Order             Diet regular Room service appropriate? Yes; Fluid consistency: Thin  Diet effective now                   EDUCATION NEEDS:   No education needs have been identified at this time  Skin:  Skin Assessment: Reviewed RN Assessment Skin Integrity Issues:: Incisions, DTI DTI: L buttocks Incisions: Abdomen x3, closed  Last BM:  09/19/20 (50 ml via colostomy)  Height:   Ht Readings from Last 1 Encounters:  09/01/20 '5\' 10"'$  (1.778 m)    Weight:   Wt Readings from Last 1 Encounters:  09/20/20 68.8 kg   BMI:  Body mass index is 21.76 kg/m.  Estimated Nutritional Needs:   Kcal:  2400-2600  Protein:  125-140 grams  Fluid:  1.8 L/day    Loistine Chance, RD, LDN, [place] Registered Dietitian II Certified Diabetes Care and Education Specialist Please refer to [facility] for RD and/or RD on-call/weekend/after hours pager

## 2020-09-20 NOTE — Progress Notes (Signed)
Colostomy bag changed today. Colostomy supplies reordered. [employer] does not have Kellie Simmering [phone].

## 2020-09-20 NOTE — Plan of Care (Signed)
  Problem: Education: Goal: Knowledge of ostomy care will improve Description: Patient and wife will be able to manage ostomy with min assist using handouts and educational resources Outcome: Progressing   Problem: Coping: Goal: Coping ability will improve Outcome: Progressing RN demonstrated how to empty colostomy in the commode and patient was able to do it himself. Some frustration noted but patient said this is how it's going to be the next few months. Support provided to patient.

## 2020-09-20 NOTE — Progress Notes (Signed)
Physical Therapy Note  Patient Details  Name: Roger Brennan MRN: [id number] Date of Birth: Oct 31, 1960 Today's Date: 09/20/2020  Missed therapy time x 60'  Nsg approached this PT in AM that pt states did not want to participate w/ therapy today.  PT still attempted therapy, but pt politely refuses, wants to rest R ankle as well as told someone during the week that he did not want therapy on Sunday.  Pt remained supine in bed, all needs in reach and family member in room.   Ladoris Gene 09/20/2020, 2:41 PM

## 2020-09-21 ENCOUNTER — Inpatient Hospital Stay (HOSPITAL_COMMUNITY): Payer: Managed Care, Other (non HMO)

## 2020-09-21 LAB — CBC
HCT: 33.4 % — ABNORMAL LOW (ref 39.0–52.0)
Hemoglobin: 10.5 g/dL — ABNORMAL LOW (ref 13.0–17.0)
MCH: 28.5 pg (ref 26.0–34.0)
MCHC: 31.4 g/dL (ref 30.0–36.0)
MCV: 90.5 fL (ref 80.0–100.0)
Platelets: 316 10*3/uL (ref 150–400)
RBC: 3.69 MIL/uL — ABNORMAL LOW (ref 4.22–5.81)
RDW: 16.5 % — ABNORMAL HIGH (ref 11.5–15.5)
WBC: 6.4 10*3/uL (ref 4.0–10.5)
nRBC: 0 % (ref 0.0–0.2)

## 2020-09-21 LAB — BASIC METABOLIC PANEL
Anion gap: 8 (ref 5–15)
BUN: 21 mg/dL — ABNORMAL HIGH (ref 6–20)
CO2: 24 mmol/L (ref 22–32)
Calcium: 9.6 mg/dL (ref 8.9–10.3)
Chloride: 104 mmol/L (ref 98–111)
Creatinine, Ser: 0.6 mg/dL — ABNORMAL LOW (ref 0.61–1.24)
GFR, Estimated: >60 mL/min (ref >60)
Glucose, Bld: 137 mg/dL — ABNORMAL HIGH (ref 70–99)
Potassium: 3.7 mmol/L (ref 3.5–5.1)
Sodium: 136 mmol/L (ref 135–145)

## 2020-09-21 LAB — GLUCOSE, CAPILLARY
Glucose-Capillary: 130 mg/dL — ABNORMAL HIGH (ref 70–99)
Glucose-Capillary: 94 mg/dL (ref 70–99)
Glucose-Capillary: 212 mg/dL — ABNORMAL HIGH (ref 70–99)
Glucose-Capillary: 141 mg/dL — ABNORMAL HIGH (ref 70–99)

## 2020-09-21 NOTE — Progress Notes (Signed)
Physical Therapy Session Note  Patient Details  Name: Roger Brennan MRN: [id number] Date of Birth: [birth year]  Today's Date: 09/21/2020 PT Individual Time: 1002-1100 PT Individual Time Calculation (min): 58 min   Short Term Goals: Week 3:  PT Short Term Goal 1 (Week 3): STG=LTG due to LOS  Skilled Therapeutic Interventions/Progress Updates:    Patient in supine and RN in room to deliver medication.  Reports due to x-ray of R ankle as still painful and MD thinks could be a tendon.  Patient up to EOB independent and donned shoe on R after removing ice.  Patient sit to stand independent and ambulated without AD to ortho gym 120' independent, no LOB, mild antalgia noted on R throughout.  Patient performed car transfer mod I to simulated SUV height.  Negotiated ramp without device independently.  Seated for LE strength assessment and reports still with some core weakness.  Performed Berg balance assessment as noted below scored 54/56.  Reviewed education on fall prevention and energy conservation with pt reporting good understanding.  Educated in [place] and pt performed x 5-10 reps of each: sit<>stand, supine bridge x 5 reps w/ 5 sec hold, sidelying clamshell hip abduction with green t-band, SLR, supine to sit independent.  Standing trunk rotation with arm diagonal D2/D1 flexion with lateral weight shift x 5, side stepping each way and forward tandem gait with hand vs. Counter support.  Patient issued handout with all as noted.  Ambulated x 150' to room no device.  Discussed being independent in the room and RN made aware with sign placed on door.  Left in recliner with needs in reach.  Therapy Documentation Precautions:  Precautions Precautions: Fall Precaution Comments: colostomy Restrictions Weight Bearing Restrictions: No  Pain: Pain Assessment Pain Score: 4  Pain Type: Acute pain Pain Location: Ankle Pain Orientation: Right Pain Descriptors / Indicators: Sore;Aching Pain Onset:  On-going Pain Intervention(s): Ambulation/increased activity;Cold applied Balance: Balance Balance Assessed: Yes Standardized Balance Assessment Standardized Balance Assessment: Berg Balance Test Berg Balance Test Sit to Stand: Able to stand without using hands and stabilize independently Standing Unsupported: Able to stand safely 2 minutes Sitting with Back Unsupported but Feet Supported on Floor or Stool: Able to sit safely and securely 2 minutes Stand to Sit: Sits safely with minimal use of hands Transfers: Able to transfer safely, minor use of hands Standing Unsupported with Eyes Closed: Able to stand 10 seconds safely Standing Ubsupported with Feet Together: Able to place feet together independently and stand 1 minute safely From Standing, Reach Forward with Outstretched Arm: Can reach confidently >25 cm (10") From Standing Position, Pick up Object from Floor: Able to pick up shoe safely and easily From Standing Position, Turn to Look Behind Over each Shoulder: Looks behind from both sides and weight shifts well Turn 360 Degrees: Able to turn 360 degrees safely in 4 seconds or less Standing Unsupported, Alternately Place Feet on Step/Stool: Able to stand independently and safely and complete 8 steps in 20 seconds Standing Unsupported, One Foot in Front: Able to place foot tandem independently and hold 30 seconds Standing on One Leg: Able to lift leg independently and hold equal to or more than 3 seconds Total Score: 54 Static Sitting Balance Static Sitting - Balance Support: Feet supported;No upper extremity supported Static Sitting - Level of Assistance: 7: Independent Dynamic Sitting Balance Dynamic Sitting - Balance Support: Feet supported;No upper extremity supported Dynamic Sitting - Level of Assistance: 7: Independent Static Standing Balance Static Standing - Balance Support: No  upper extremity supported Static Standing - Level of Assistance: 7: Independent Dynamic Standing  Balance Dynamic Standing - Balance Support: No upper extremity supported Dynamic Standing - Level of Assistance: 7: Independent Dynamic Standing - Balance Activities: Forward lean/weight shifting;Reaching for objects  Therapy/Group: Individual Therapy  Reginia Naas [place], PT 09/21/2020, 12:06 PM

## 2020-09-21 NOTE — Progress Notes (Addendum)
Patient ID: Roger Brennan, male   DOB: Jul 25, 1960, 60 y.o.   MRN: [id number]  Spoke with [employer] who has found a home health agency to service [name]. Will inform pt and wife of this  3:10 PM let pt and wife know a home health agency has been found, both very pleased and feel much better about going home tomorrow.

## 2020-09-21 NOTE — Progress Notes (Signed)
PROGRESS NOTE   Subjective/Complaints: Excited about discharge tomorrow Right ankle has been mor swollen and painful with walking- XR ordered, educated regarding possible diagnoses, brace ordered  ROS: Denies CP, SOB, N/V/D, left knee pain much improved, +right ankle pain  Objective:   No results found. Recent Labs    09/21/20 0614  WBC 6.4  HGB 10.5*  HCT 33.4*  PLT 316      Recent Labs    09/21/20 0614  NA 136  K 3.7  CL 104  CO2 24  GLUCOSE 137*  BUN 21*  CREATININE 0.60*  CALCIUM 9.6     Intake/Output Summary (Last 24 hours) at 09/21/2020 1342 Last data filed at 09/21/2020 0500 Gross per 24 hour  Intake 680 ml  Output 1300 ml  Net -620 ml         Physical Exam: Vital Signs Blood pressure 120/85, pulse 77, temperature 97.9 F (36.6 C), resp. rate 17, height $RemoveBe'5\' 10"'zojNAwZwY$  (1.778 m), weight 69.2 kg, SpO2 100 %. Gen: no distress, normal appearing HEENT: oral mucosa pink and moist, NCAT Cardio: Reg rate Chest: normal effort, normal rate of breathing   GI: Non-distended.  BS +.  + Ostomy. Skin: Warm and dry.  Bilateral abdominal wound with good granulation tissue, some fibrinous tissue noted in distal right wound bed. Right stage 2 pressure injury to right buttock Psych: Normal mood.  Normal behavior. Musc: No edema in extremities.  No tenderness in extremities. Neuro: Alert and oriented Motor: Grossly 4-4+/5 throughout  Assessment/Plan: 1. Functional deficits which require 3+ hours per day of interdisciplinary therapy in a comprehensive inpatient rehab setting. Physiatrist is providing close team supervision and 24 hour management of active medical problems listed below. Physiatrist and rehab team continue to assess barriers to discharge/monitor patient progress toward functional and medical goals  Care Tool:  Bathing    Body parts bathed by patient: Right arm, Left arm, Chest, Abdomen, Front  perineal area, Buttocks, Right upper leg, Left upper leg, Right lower leg, Left lower leg, Face   Body parts bathed by helper: Right lower leg, Left lower leg, Buttocks     Bathing assist Assist Level: Independent with assistive device     Upper Body Dressing/Undressing Upper body dressing   What is the patient wearing?: Pull over shirt    Upper body assist Assist Level: Independent with assistive device    Lower Body Dressing/Undressing Lower body dressing      What is the patient wearing?: Pants     Lower body assist Assist for lower body dressing: Independent with assitive device     Toileting Toileting    Toileting assist Assist for toileting: Independent Assistive Device Comment: Urinal   Transfers Chair/bed transfer  Transfers assist     Chair/bed transfer assist level: Independent     Locomotion Ambulation   Ambulation assist      Assist level: Independent Assistive device: No Device Max distance: 150'   Walk 10 feet activity   Assist     Assist level: Independent Assistive device: No Device   Walk 50 feet activity   Assist Walk 50 feet with 2 turns activity did not occur: Safety/medical concerns  Assist level: Independent Assistive device: No Device    Walk 150 feet activity   Assist Walk 150 feet activity did not occur: Safety/medical concerns  Assist level: Independent Assistive device: No Device    Walk 10 feet on uneven surface  activity   Assist Walk 10 feet on uneven surfaces activity did not occur: Safety/medical concerns   Assist level: Independent with assistive device Assistive device: Other (comment) (rail)   Wheelchair     Assist Is the patient using a wheelchair?: No Type of Wheelchair: Manual Wheelchair activity did not occur: N/A  Wheelchair assist level: Supervision/Verbal cueing Max wheelchair distance: 140    Wheelchair 50 feet with 2 turns activity    Assist    Wheelchair 50 feet  with 2 turns activity did not occur: N/A   Assist Level: Supervision/Verbal cueing   Wheelchair 150 feet activity     Assist  Wheelchair 150 feet activity did not occur: N/A   Assist Level: Maximal Assistance - Patient 25 - 49%   Blood pressure 120/85, pulse 77, temperature 97.9 F (36.6 C), resp. rate 17, height $RemoveBe'5\' 10"'IYxsnkDwh$  (1.778 m), weight 69.2 kg, SpO2 100 %.  Medical Problem List and Plan: 1.  Debility Continue CIR   2.  L-gastroc DVT: continue Eliquis. No thrombus seen on dopplers 8/6. There are swollen groin LN bilaterally- this could be a response to infection.              -antiplatelet therapy: N/A 3. Pain from bilateral lower extremity swelling:  Continue Oxycodone prn- decreased to $RemoveBefo'5mg'tazSKzQAuph$  for moderate pain and $Remove'10mg'rPMnKEc$  for severe pain --Robaxin 1000 mg tid Ice/elevation Cr stable on Lasix- continue low dose supplementation. See #11 Added voltaren gel scheduled for right knee List of foods that can help with pain.  Discontinued dilaudid.  Controlled on 8/20 4. Mood: LCSW to follow for evaluation and support.             -antipsychotic agents: N/AA 5. Neuropsych: This patient is capable of making decisions on his own behalf. 6. Abdominal incisions (3) and colostomy: Damp to dry dressing changes twice daily. Please initiate education of wife on dressing changes. Will try to get home health aide to assist him. Wife has questions regarding this- I have left a voicemail with her today to discuss. Discussed provate pay options 7. Fluids/Electrolytes/Nutrition: Monitor intake/output.   -Continue Juven twice daily.   -Continue vitamin C and zinc to help promote wound healing.  8. Wound infection: Provided list of foods for wound healing.  9. Hyponatremia: Remove fluid restriction  Improving.  10. Acute blood loss anemia: Monitor with serial checks.Hgb reviewed; improved to 10.6 on 8/15 11.  Polyarthralgias, gout with effusion: Resolved Continue Celebrex daily.  Good results from  ortho aspiration.  Discussed with patient that ESR and CRP are trending downward.   12. New diagnosis T2DM: Hgb A1C-7.4.              --monitor BS ac/hs and use SSI for elevated BS.             --consulted RD for dietary education.  --provided with list of foods that are good for diabetes.   -d/c Boost since and use Ensure Max instead.   -educated that control has been better, discussed avoidance of juice. Educated that recent spike is due to steroids.   Appears to be stabilizing and elevated, will consider medication initiation if persistent  -improving.  CBG (last 3)  Recent Labs    09/20/20  2127 09/21/20 0535 09/21/20 1119  GLUCAP 157* 130* 94  13. Right ankle pain: XR and brace ordered, ice and elevate  LOS: 20 days A FACE TO FACE EVALUATION WAS PERFORMED  Martha Clan P Idy Rawling 09/21/2020, 1:42 PM

## 2020-09-21 NOTE — Progress Notes (Signed)
Occupational Therapy Session Note  Patient Details  Name: Roger Brennan MRN: [id number] Date of Birth: [birth year]  Today's Date: 09/21/2020 OT Individual Time: 1400-1453 OT Individual Time Calculation (min): 53 min    Short Term Goals: Week 3:  OT Short Term Goal 1 (Week 3): STG = LTGs due to remaining LOS  Skilled Therapeutic Interventions/Progress Updates:  Pt greeted EOB agreeable to OT intervention. Session focus on energy conservation strategies ( ECS) in relation to community re-entry. Pt completed functional mobility down to atrium MOD I with rollator. Pt completed functional mobility in gift shop to simulate mobility in grocery stores etc. Discussed ECS for community mobility such as having a plan/list, asking for assist to reach items as needed, using rollator to sit for rest breaks and being mindful of surroundings/ obstacles in community. Practiced transfers in community setting to benches and chairs with no arm rests with pt completing transfers MOD I. Remainder of session to focus on education related to ECS, managing fatigue/ deconditioning, handouts provided on all topics to increase carryover. Pt left seated EOB with all needs within reach.   Therapy Documentation Precautions:  Precautions Precautions: Fall Precaution Comments: colostomy Restrictions Weight Bearing Restrictions: No  Pain: Pt reports mild pain in R ankle, provided rest breaks, repositioning and distraction as pain mgmt strategies.    Therapy/Group: Individual Therapy  Corinne Ports [facility] 09/21/2020, 4:06 PM

## 2020-09-21 NOTE — Progress Notes (Signed)
Occupational Therapy Session Note  Patient Details  Name: Roger Brennan MRN: [id number] Date of Birth: 1961/01/17  Today's Date: 09/21/2020 OT Individual Time: 0832-0930 OT Individual Time Calculation (min): 58 min    Short Term Goals: Week 3:  OT Short Term Goal 1 (Week 3): STG = LTGs due to remaining LOS  Skilled Therapeutic Interventions/Progress Updates:    Treatment session with focus on self-care retraining, functional mobility, and activity tolerance/endurance.  Pt received upright in recliner reporting already washed up and dressed.  Pt reports pain in R ankle but able to engage in therapy at modified level.  Pt ambulated to sink without AD and completed oral care in standing Independently.  Pt then ambulated in to bathroom to urinate in standing.  MD arrived, discussed R ankle pain and diet recommendations for improved wound healing.  Pt then ambulated 100' without AD with supervision to therapy gym.  Engaged in Nustep 4 mins on level 5 for "warm up" per pt request.  Therapist directed pt in calf raises, mini squats, forward and lateral lunges in parallel bars for improved mobility, balance, and endurance as needed for ADLs and IADLs. Engaged in single leg heel raises from 6" step 2 sets of 10 BLE with CGA and BUE support on bar.  Engaged in grape vine pattern with UE support with CGA for dynamic balance and balance reactions.  Therapist encouraged pt to engage in tasks to tolerance, considering ankle tenderness.  Pt ambulated back to room and returned to semi-reclined in bed Mod I.  Therapist applied ice to R ankle, notified RN of request for pain meds.  Therapy Documentation Precautions:  Precautions Precautions: Fall Precaution Comments: colostomy Restrictions Weight Bearing Restrictions: No  Pain: Pain Assessment Pain Scale: 0-10 Pain Score: 5  R ankle.  Applied ice at end of session, RN notified for requested pain meds    Therapy/Group: Individual Therapy  Simonne Come 09/21/2020, 9:31 AM

## 2020-09-21 NOTE — Progress Notes (Signed)
Occupational Therapy Discharge Summary  Patient Details  Name: Roger Brennan MRN: [id number] Date of Birth: 11-29-1960   Patient has met 6 of 6 long term goals due to improved activity tolerance, improved balance, postural control, ability to compensate for deficits, and improved awareness.  Patient to discharge at overall Modified Independent level, supervision for bathing and toileting d/t ostomy care.  Patient's care partner is independent to provide the necessary  intermittent  assistance at discharge.   Reasons goals not met: N/A  Recommendation:  Patient will not require follow up OT at this time.  Equipment: No equipment provided  Reasons for discharge: treatment goals met and discharge from hospital  Patient/family agrees with progress made and goals achieved: Yes  OT Discharge Precautions/Restrictions  Precautions Precautions: Fall Precaution Comments: colostomy Restrictions Weight Bearing Restrictions: No Pain Pain Assessment Pain Scale: 0-10 Pain Score: 5  ADL ADL Eating: Independent Where Assessed-Eating: Edge of bed Grooming: Independent Where Assessed-Grooming: Standing at sink Upper Body Bathing: Modified independent Where Assessed-Upper Body Bathing: Sitting at sink, Standing at sink Lower Body Bathing: Supervision/safety Where Assessed-Lower Body Bathing: Sitting at sink, Standing at sink Upper Body Dressing: Modified independent (Device) Where Assessed-Upper Body Dressing: Sitting at sink, Standing at sink Lower Body Dressing: Modified independent Where Assessed-Lower Body Dressing: Sitting at sink, Standing at sink Toileting: Modified independent Where Assessed-Toileting: Risk analyst Method: Ambulating Vision Baseline Vision/History: 0 No visual deficits Patient Visual Report: No change from baseline Vision Assessment?: No apparent visual deficits Perception  Perception: Within Functional  Limits Praxis Praxis: Intact Cognition Overall Cognitive Status: Within Functional Limits for tasks assessed Arousal/Alertness: Awake/alert Orientation Level: Oriented X4 Year: 2022 Month: August Day of Week: Correct Attention: Alternating Alternating Attention: Appears intact Memory: Appears intact Immediate Memory Recall: Sock;Blue;Bed Memory Recall Sock: Without Cue Memory Recall Blue: Without Cue Memory Recall Bed: Without Cue Awareness: Appears intact Problem Solving: Appears intact Safety/Judgment: Appears intact Sensation Sensation Light Touch: Appears Intact Proprioception: Appears Intact Coordination Gross Motor Movements are Fluid and Coordinated: Yes Fine Motor Movements are Fluid and Coordinated: Yes Coordination and Movement Description: generalized weakness and deconditioning Finger Nose Finger Test: WFL bilaterally Heel Shin Test: WFL bilaterally Motor  Motor Motor: Within Functional Limits Motor - Skilled Clinical Observations: generalized weakness and deconditioning Mobility  Bed Mobility Bed Mobility: Rolling Right;Rolling Left;Sit to Supine;Supine to Sit Rolling Right: Independent Rolling Left: Independent Supine to Sit: Independent Sit to Supine: Independent Transfers Sit to Stand: Independent with assistive device Stand to Sit: Independent with assistive device  Trunk/Postural Assessment  Cervical Assessment Cervical Assessment: Exceptions to [facility] (cervical kyphosis) Thoracic Assessment Thoracic Assessment: Exceptions to [facility] (thoracic kyphosis) Lumbar Assessment Lumbar Assessment: Exceptions to [facility] (mild posterior pelvic tilt) Postural Control Postural Control: Deficits on evaluation  Balance Balance Balance Assessed: Yes Static Sitting Balance Static Sitting - Balance Support: Feet supported;No upper extremity supported Static Sitting - Level of Assistance: 7: Independent Dynamic Sitting Balance Dynamic Sitting - Balance Support: Feet  supported;No upper extremity supported Dynamic Sitting - Level of Assistance: 7: Independent Static Standing Balance Static Standing - Balance Support: Bilateral upper extremity supported (rollator) Static Standing - Level of Assistance: 6: Modified independent (Device/Increase time) Dynamic Standing Balance Dynamic Standing - Balance Support: Bilateral upper extremity supported (rollator) Dynamic Standing - Level of Assistance: 6: Modified independent (Device/Increase time) Extremity/Trunk Assessment RUE Assessment RUE Assessment: Within Functional Limits General Strength Comments: grossly 4/5 LUE Assessment General Strength Comments: grossly 4/5   Roger Brennan 09/21/2020, 10:19 AM

## 2020-09-21 NOTE — Progress Notes (Signed)
Orthopedic Tech Progress Note Patient Details:  Roger Brennan [birth year] [id number]  Ortho Devices Type of Ortho Device: ASO Ortho Device/Splint Location: RLE Ortho Device/Splint Interventions: Ordered, Application, Adjustment   Post Interventions Patient Tolerated: Well Instructions Provided: [name] 09/21/2020, 3:57 PM

## 2020-09-21 NOTE — Progress Notes (Addendum)
Inpatient Rehabilitation Care Coordinator Discharge Note   Patient Details  Name: Max Sweeny MRN: [id number] Date of Birth: 05/22/1960   Discharge location: HOME WITH WIFE  Length of Stay:  21 DAYS  Discharge activity level: SUPERVISION-MOD/I LEVEL  Home/community participation: YES  Patient response [id number] Literacy - How often do you need to have someone help you when you read instructions, pamphlets, or other written material from your doctor or pharmacy?: Never  Patient response [id number] Isolation - How often do you feel lonely or isolated from those around you?: Sometimes  Services provided included: MD, RD, PT, OT, RN, CM, TR, Pharmacy, Neuropsych, SW  Financial Services:  Charity fundraiser Utilized: Publishing copy offered to/list presented to: PT AND WIFE  Follow-up services arranged:  Home Health, DME Home Health Agency: [facility]    DME : [employer] [facility]LL[facility] WALK[facility]-FAMILY TO GO TO [place]    Patient response to transportation need: Is the patient able to respond to transportation needs?: Yes In the past 12 months, has lack of transportation kept you from medical appointments or from getting medications?: No In the past 12 months, has lack of transportation kept you from meetings, work, or from getting things needed for daily living?: No    Comments (or additional information): [employer] HAD DIFFICULTY WITH FINDING A HOME HEALTH AGENCY TO TAKE REFERRAL. DID FIND DAY BEFORE DISCHARGE. WIFE AND PT STILL NEEDING TO LEARN DRESSING CHANGES AND HANDS ON APPROACH. GAVE THEM PRIVATE DUTY LIST TO HIRE LPN & RN TO DO DRESSING CHANGES.   Patient/Family verbalized understanding of follow-up arrangements:  Yes  Individual responsible for coordination of the follow-up plan: DEBRA-WIFE [phone]  Confirmed correct DME delivered: Elease Hashimoto 09/21/2020    Lianette Broussard, Gardiner Rhyme

## 2020-09-22 ENCOUNTER — Other Ambulatory Visit (HOSPITAL_COMMUNITY): Payer: Self-pay

## 2020-09-22 DIAGNOSIS — I82409 Acute embolism and thrombosis of unspecified deep veins of unspecified lower extremity: Secondary | ICD-10-CM

## 2020-09-22 DIAGNOSIS — E559 Vitamin D deficiency, unspecified: Secondary | ICD-10-CM

## 2020-09-22 DIAGNOSIS — M25571 Pain in right ankle and joints of right foot: Secondary | ICD-10-CM

## 2020-09-22 DIAGNOSIS — F4323 Adjustment disorder with mixed anxiety and depressed mood: Secondary | ICD-10-CM

## 2020-09-22 LAB — GLUCOSE, CAPILLARY: Glucose-Capillary: 120 mg/dL — ABNORMAL HIGH (ref 70–99)

## 2020-09-22 MED ORDER — ZINC SULFATE 220 (50 ZN) MG PO TABS
220.0000 mg | ORAL_TABLET | Freq: Every day | ORAL | 0 refills | Status: DC
  Filled 2020-09-22: qty 30, 30d supply, fill #0

## 2020-09-22 MED ORDER — OXYCODONE HCL 5 MG PO TABS
5.0000 mg | ORAL_TABLET | Freq: Four times a day (QID) | ORAL | 0 refills | Status: DC | PRN
  Filled 2020-09-22: qty 30, 4d supply, fill #0

## 2020-09-22 MED ORDER — DICLOFENAC SODIUM 1 % EX GEL
2.0000 g | Freq: Four times a day (QID) | CUTANEOUS | 0 refills | Status: DC
  Filled 2020-09-22: qty 200, 30d supply, fill #0

## 2020-09-22 MED ORDER — POLYETHYLENE GLYCOL 3350 17 GM/SCOOP PO POWD
17.0000 g | Freq: Two times a day (BID) | ORAL | 0 refills | Status: DC
  Filled 2020-09-22: qty 510, 15d supply, fill #0

## 2020-09-22 MED ORDER — VITAMIN D (ERGOCALCIFEROL) 1.25 MG (50000 UNIT) PO CAPS
50000.0000 [IU] | ORAL_CAPSULE | ORAL | 0 refills | Status: DC
  Filled 2020-09-22: qty 4, 28d supply, fill #0

## 2020-09-22 MED ORDER — METHOCARBAMOL 500 MG PO TABS
1000.0000 mg | ORAL_TABLET | Freq: Three times a day (TID) | ORAL | 0 refills | Status: DC
  Filled 2020-09-22: qty 180, 30d supply, fill #0

## 2020-09-22 MED ORDER — APIXABAN 5 MG PO TABS
5.0000 mg | ORAL_TABLET | Freq: Two times a day (BID) | ORAL | 0 refills | Status: DC
  Filled 2020-09-22: qty 60, 30d supply, fill #0

## 2020-09-22 MED ORDER — PANTOPRAZOLE SODIUM 40 MG PO TBEC
40.0000 mg | DELAYED_RELEASE_TABLET | Freq: Two times a day (BID) | ORAL | 0 refills | Status: DC
  Filled 2020-09-22: qty 60, 30d supply, fill #0

## 2020-09-22 MED ORDER — CELECOXIB 200 MG PO CAPS
200.0000 mg | ORAL_CAPSULE | Freq: Every day | ORAL | 0 refills | Status: DC
  Filled 2020-09-22: qty 30, 30d supply, fill #0

## 2020-09-22 MED ORDER — ASCORBIC ACID 500 MG PO TABS
500.0000 mg | ORAL_TABLET | Freq: Two times a day (BID) | ORAL | 0 refills | Status: DC
  Filled 2020-09-22: qty 60, 30d supply, fill #0

## 2020-09-22 MED ORDER — JUVEN PO PACK
1.0000 | PACK | Freq: Two times a day (BID) | ORAL | 0 refills | Status: DC
  Filled 2020-09-22: qty 60, 30d supply, fill #0

## 2020-09-22 MED ORDER — TRAZODONE HCL 50 MG PO TABS
25.0000 mg | ORAL_TABLET | Freq: Every evening | ORAL | 0 refills | Status: DC | PRN
  Filled 2020-09-22: qty 30, 30d supply, fill #0

## 2020-09-22 NOTE — Progress Notes (Signed)
PROGRESS NOTE   Subjective/Complaints: Stable for d/c today Discussed outpatient plan Has f/u with surgery tomorrow Discussed return to work  ROS: Denies CP, SOB, N/V/D, left knee pain much improved, +right ankle pain  Objective:   DG Ankle Complete Right  Result Date: 09/21/2020 CLINICAL DATA:  Pain on medial side of right ankle EXAM: RIGHT ANKLE - COMPLETE 3+ VIEW COMPARISON:  None. FINDINGS: There is no acute fracture or dislocation. Alignment is normal. The ankle mortise is symmetrically intact. The soft tissues are unremarkable. There is mild Achilles enthesopathy and inferior calcaneal spurring. There is mild degenerative change about the midfoot. IMPRESSION: No acute osseous abnormality identified. Electronically Signed   By: Valetta Mole M.D.   On: 09/21/2020 19:57   Recent Labs    09/21/20 0614  WBC 6.4  HGB 10.5*  HCT 33.4*  PLT 316      Recent Labs    09/21/20 0614  NA 136  K 3.7  CL 104  CO2 24  GLUCOSE 137*  BUN 21*  CREATININE 0.60*  CALCIUM 9.6     Intake/Output Summary (Last 24 hours) at 09/22/2020 1033 Last data filed at 09/22/2020 0900 Gross per 24 hour  Intake 676 ml  Output 0 ml  Net 676 ml         Physical Exam: Vital Signs Blood pressure 113/84, pulse 93, temperature 97.8 F (36.6 C), resp. rate 15, height _0  (1.778 m), weight 69.2 kg, SpO2 99 %. Gen: no distress, normal appearing HEENT: oral mucosa pink and moist, NCAT Cardio: Reg rate Chest: normal effort, normal rate of breathing   GI: Non-distended.  BS +.  + Ostomy. Skin: Warm and dry.  Bilateral abdominal wound with good granulation tissue, some fibrinous tissue noted in distal right wound bed. Right stage 2 pressure injury to right buttock Psych: Normal mood.  Normal behavior. Musc: No edema in extremities.  No tenderness in extremities. Neuro: Alert and oriented Motor: Grossly 4-4+/5  throughout  Assessment/Plan: 1. Functional deficits which require 3+ hours per day of interdisciplinary therapy in a comprehensive inpatient rehab setting. Physiatrist is providing close team supervision and 24 hour management of active medical problems listed below. Physiatrist and rehab team continue to assess barriers to discharge/monitor patient progress toward functional and medical goals  Care Tool:  Bathing    Body parts bathed by patient: Right arm, Left arm, Chest, Abdomen, Front perineal area, Buttocks, Right upper leg, Left upper leg, Right lower leg, Left lower leg, Face   Body parts bathed by helper: Right lower leg, Left lower leg, Buttocks     Bathing assist Assist Level: Independent with assistive device     Upper Body Dressing/Undressing Upper body dressing   What is the patient wearing?: Pull over shirt    Upper body assist Assist Level: Independent with assistive device    Lower Body Dressing/Undressing Lower body dressing      What is the patient wearing?: Pants     Lower body assist Assist for lower body dressing: Independent with assitive device     Toileting Toileting    Toileting assist Assist for toileting: Independent Assistive Device Comment: Urinal   Transfers Chair/bed  transfer  Transfers assist     Chair/bed transfer assist level: Independent with assistive device     Locomotion Ambulation   Ambulation assist      Assist level: Independent with assistive device Assistive device: Rollator Max distance: 155f   Walk 10 feet activity   Assist     Assist level: Independent with assistive device Assistive device: Rollator   Walk 50 feet activity   Assist Walk 50 feet with 2 turns activity did not occur: Safety/medical concerns  Assist level: Independent with assistive device Assistive device: Rollator    Walk 150 feet activity   Assist Walk 150 feet activity did not occur: Safety/medical concerns  Assist  level: Independent with assistive device Assistive device: Rollator    Walk 10 feet on uneven surface  activity   Assist Walk 10 feet on uneven surfaces activity did not occur: Safety/medical concerns   Assist level: Supervision/Verbal cueing Assistive device: Rollator   Wheelchair     Assist Is the patient using a wheelchair?: No Type of Wheelchair: Manual Wheelchair activity did not occur: N/A  Wheelchair assist level: Supervision/Verbal cueing Max wheelchair distance: 140    Wheelchair 50 feet with 2 turns activity    Assist    Wheelchair 50 feet with 2 turns activity did not occur: N/A   Assist Level: Supervision/Verbal cueing   Wheelchair 150 feet activity     Assist  Wheelchair 150 feet activity did not occur: N/A   Assist Level: Maximal Assistance - Patient 25 - 49%   Blood pressure 113/84, pulse 93, temperature 97.8 F (36.6 C), resp. rate 15, height _0  (1.778 m), weight 69.2 kg, SpO2 99 %.  Medical Problem List and Plan: 1.  Debility D/c home today 2.  L-gastroc DVT: continue Eliquis. No thrombus seen on dopplers 8/6. There are swollen groin LN bilaterally- this could be a response to infection.              -antiplatelet therapy: N/A 3. Pain from bilateral lower extremity swelling:  Continue Oxycodone prn- decreased to 532mfor moderate pain and 1027mor severe pain --Robaxin 1000 mg tid Ice/elevation Cr stable on Lasix- continue low dose supplementation. See #11 Added voltaren gel scheduled for right knee List of foods that can help with pain.  Discontinued dilaudid.  Controlled on 8/23.  4. Mood: LCSW to follow for evaluation and support.             -antipsychotic agents: N/AA 5. Neuropsych: This patient is capable of making decisions on his own behalf. 6. Abdominal incisions (3) and colostomy: Damp to dry dressing changes twice daily. Please initiate education of wife on dressing changes. Will try to get home health aide to assist  him. Wife has questions regarding this- I have left a voicemail with her today to discuss. Discussed provate pay options 7. Fluids/Electrolytes/Nutrition: Monitor intake/output.   -Continue Juven twice daily.   -Continue vitamin C and zinc to help promote wound healing.  8. Wound infection: Provided list of foods for wound healing.  9. Hyponatremia: Remove fluid restriction  Improving.  10. Acute blood loss anemia: Monitor with serial checks.Hgb reviewed; improved to 10.6 on 8/15 11.  Polyarthralgias, gout with effusion: Resolved Continue Celebrex daily.  Good results from ortho aspiration.  Discussed with patient that ESR and CRP are trending downward. Improved  12. New diagnosis T2DM: Hgb A1C-7.4.              --monitor BS ac/hs and use SSI for  elevated BS.             --consulted RD for dietary education.  --provided with list of foods that are good for diabetes.   -d/c Boost since and use Ensure Max instead.   -educated that control has been better, discussed avoidance of juice. Educated that recent spike is due to steroids. Educated to avoid foods with added sugars.   Appears to be stabilizing and elevated, will consider medication initiation if persistent  -improving.  CBG (last 3)  Recent Labs    09/21/20 1611 09/21/20 2048 09/22/20 0538  GLUCAP 212* 141* 120*  13. Right ankle pain: XR and brace ordered, ice and elevate- discussed XR results.    >30 minutes spent in discharge of patient including review of medications and follow-up appointments, physical examination, and in answering all patient's questions   LOS: 21 days A FACE TO [address] [place] 09/22/2020, 10:33 AM

## 2020-09-22 NOTE — Discharge Summary (Signed)
Physician Discharge Summary  Patient ID: Roger Brennan MRN: [id number] DOB/AGE: [birth year] 60 y.o.  Admit date: 09/01/2020 Discharge date: 09/22/2020  Discharge Diagnoses:  Principal Problem:   Debility Active Problems:   Malnutrition of moderate degree   Acute idiopathic gout of right knee   Acute blood loss anemia   Incisional pain   Diabetes mellitus, new onset ([place])   Right ankle pain   Adjustment disorder with mixed anxiety and depressed mood   DVT (deep venous thrombosis) ([place])   Vitamin D deficiency   Discharged Condition: good  Significant Diagnostic Studies: DG Ankle Complete Right  Result Date: 09/21/2020 CLINICAL DATA:  Pain on medial side of right ankle EXAM: RIGHT ANKLE - COMPLETE 3+ VIEW COMPARISON:  None. FINDINGS: There is no acute fracture or dislocation. Alignment is normal. The ankle mortise is symmetrically intact. The soft tissues are unremarkable. There is mild Achilles enthesopathy and inferior calcaneal spurring. There is mild degenerative change about the midfoot. IMPRESSION: No acute osseous abnormality identified. Electronically Signed   By: Valetta Mole M.D.   On: 09/21/2020 19:57   VAS [place] LOWER EXTREMITY VENOUS (DVT)  Result Date: 09/06/2020  Lower Venous DVT Study Patient Name:  VEARL Brennan  Date of Exam:   09/05/2020 Medical Rec #: [id number]        Accession #:    [id number] Date of Birth: 15-Jan-1961         Patient Gender: M Patient Age:   60 years Exam Location:  [facility] Procedure:      VAS [place] LOWER EXTREMITY VENOUS (DVT) Referring Phys: Leeroy Cha --------------------------------------------------------------------------------  Indications: Pain.  Anticoagulation: Eliquis. Comparison Study: Previous exam 08/20/2020 positive RLE gastroc Performing Technologist: Rogelia Rohrer RVT, RDMS  Examination Guidelines: A complete evaluation includes B-mode imaging, spectral Doppler, color Doppler, and power Doppler as needed of all accessible portions of  each vessel. Bilateral testing is considered an integral part of a complete examination. Limited examinations for reoccurring indications may be performed as noted. The reflux portion of the exam is performed with the patient in reverse Trendelenburg.  +---------+---------------+---------+-----------+----------+--------------+ RIGHT    CompressibilityPhasicitySpontaneityPropertiesThrombus Aging +---------+---------------+---------+-----------+----------+--------------+ CFV      Full           Yes      Yes                                 +---------+---------------+---------+-----------+----------+--------------+ SFJ      Full                                                        +---------+---------------+---------+-----------+----------+--------------+ FV Prox  Full           Yes      Yes                                 +---------+---------------+---------+-----------+----------+--------------+ FV Mid   Full           Yes      Yes                                 +---------+---------------+---------+-----------+----------+--------------+ FV DistalFull  Yes      Yes                                 +---------+---------------+---------+-----------+----------+--------------+ PFV      Full                                                        +---------+---------------+---------+-----------+----------+--------------+ POP      Full           Yes      Yes                                 +---------+---------------+---------+-----------+----------+--------------+ PTV      Full                                                        +---------+---------------+---------+-----------+----------+--------------+ PERO     Full                                                        +---------+---------------+---------+-----------+----------+--------------+ Gastroc  Full                                                         +---------+---------------+---------+-----------+----------+--------------+   +----+---------------+---------+-----------+----------+--------------+ LEFTCompressibilityPhasicitySpontaneityPropertiesThrombus Aging +----+---------------+---------+-----------+----------+--------------+ CFV Full           Yes      Yes                                 +----+---------------+---------+-----------+----------+--------------+     Summary: RIGHT: - Findings suggest resolution of previously noted thrombus. - There is no evidence of deep vein thrombosis in the lower extremity. - There is no evidence of superficial venous thrombosis.  - No cystic structure found in the popliteal fossa. - Ultrasound characteristics of enlarged lymph nodes are noted in the groin.  LEFT: - No evidence of common femoral vein obstruction. - Ultrasound characteristics of enlarged lymph nodes noted in the groin.  *See table(s) above for measurements and observations. Electronically signed by Jamelle Haring on 09/06/2020 at 2:04:19 PM.    Final      Labs:  Basic Metabolic Panel: BMP Latest Ref Rng & Units 09/21/2020 09/14/2020 09/09/2020  Glucose 70 - 99 mg/dL 137(H) 220(H) 145(H)  BUN 6 - 20 mg/dL 21(H) 27(H) 17  Creatinine 0.61 - 1.24 mg/dL 0.60(L) 0.70 0.70  Sodium 135 - 145 mmol/L 136 133(L) 129(L)  Potassium 3.5 - 5.1 mmol/L 3.7 4.1 4.0  Chloride 98 - 111 mmol/L 104 99 95(L)  CO2 22 - 32 mmol/L '24 27 27  '$ Calcium 8.9 - 10.3 mg/dL 9.6 9.7 9.3     CBC:  CBC Latest Ref Rng & Units 09/21/2020 09/14/2020 09/07/2020  WBC 4.0 - 10.5 K/uL 6.4 7.8 10.1  Hemoglobin 13.0 - 17.0 g/dL 10.5(L) 10.6(L) 9.0(L)  Hematocrit 39.0 - 52.0 % 33.4(L) 33.4(L) 27.9(L)  Platelets 150 - 400 K/uL 316 511(H) 663(H)     CBG: Recent Labs  Lab 09/21/20 0535 09/21/20 1119 09/21/20 1611 09/21/20 2048 09/22/20 0538  GLUCAP 130* 94 212* 141* 120*    Brief HPI:   Roger Brennan is a 60 y.o. male with history of hypertension and hyperglycemia  otherwise in relatively good health who was admitted on 08/12/2020 with long hospital with pain due to diverticular perforation.  He was taken to or emergently for exploratory lap with debridement of LLQ wall, fluid mobilization and partial colectomy with end colostomy by Dr. Kieth Brightly.  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  Hospital course significant for acute blood loss anemia, development of DVTs in right gastroc and right axillary vein, severe pain with stiffness of bilateral elbow and right knee with body fluid negative for organism.  He was treated with Toradol followed by Celebrex with improvement briefly but had recurrent issues with polyarthralgias and pain with decreasing range of motion of right knee and right elbow. Therapies were initiated and patient was noted to have limitations due to pain, weakness and debility.  CIR was recommended due to functional decline.   Hospital Course: Shanta Kneece was admitted to rehab 09/01/2020 for inpatient therapies to consist of PT and OT at least three hours five days a week. Past admission physiatrist, therapy team and rehab RN have worked together to provide customized collaborative inpatient rehab.  He was maintained on Eliquis throughout his stay and is tolerating this without side effects.  Serial check of CBC shows H&H is improving and reactive thrombocytosis has resolved.  At admission he was found to have right knee and right elbow effusion with significant pain and erythema.  Inflammatory markers including sed rate and CRP were noted to be improving.  Lasix was added to help manage RLE edema. BLE dopplers were done for follow up and showed that DVT in right gastroc had resolved. Ortho was  consulted for input however patient declined aspiration or injection as pain and swelling were improving with addition of Lasix.  He was maintained on 1800 cc fluid restriction initially.  Serial check of electrolytes shows hyponatremia has resolved  Dressing changes have been ongoing with use of IV Dilaudid for pain management initially.  As pain control started improving Dilaudid was discontinued as he was able to tolerate dressing changes without need for  IV narcotics.  His blood pressures were monitored on TID basis and has been stable.  He has been afebrile during his stay. His blood sugars were monitored with ac/hs CBG checks and SSI was use prn for tighter BS control.  He was educated on carb modified diet as well as progression to diabetes likely due to ongoing infection.  Blood sugars have been relatively controlled overall and he was advised to follow-up with PCP for further input on blood sugar management and/or treatment.  Multiple vitamins as well as nutritional supplements were added to help promote wound healing.    He did have recurrent gout flare with right knee pain and effusion.  Knee  was aspirated and injected with 40 mg Depo-Medrol.  His blood sugars did trend up after steroid injection but not improving and anticipate will stabilize out after discharge.  Midline abdominal incision is healing well with decreasing pain.  Wound is clean without signs of infection.  His mood and anxiety levels have greatly improved with ego support and encouragement provided during his stay. He was referred to [facility] psychology to assist with adjustment reaction and anxiety after discharge.  His wife exhibited high levels of anxiety regarding wound care after discharge. Ego support, education and encouragement has been provided to help confidence levels.  He did develop right ankle pain with increase in activity and x-rays done showing evidence of mild degenerative changes.  Local care with Voltaren gel and  ice was used with improvement in symptoms.  He has made good gains during his rehab stay and is modified independent at discharge. [facility] will continue to provide HHPT and HHRN by after discharge.    Rehab course: During patient's stay in rehab weekly team conferences were held to monitor patient's progress, set goals and discuss barriers to discharge. At admission, patient required min to max assist for mobility and max assist with ADL tasks. He  has had improvement in activity tolerance, balance, postural control as well as ability to compensate for deficits. He is able to complete ADL at modified independent level. He is modified independent for transfers and is able to ambulate 150 ' without AD. BERG balance score has improved to 54/56.   Disposition: Home  Diet: Carb modified.   Special Instructions: No driving or strenuous activity till cleared by MD. 2.  Damp to dry dressing changes to midline incision daily.   Discharge Instructions     Ambulatory referral to Behavioral Health   Complete by: As directed    Adjustment reaction/prolonged hospitalization   Ambulatory referral to Physical Medicine Rehab   Complete by: As directed    Post hospital follow up      Allergies as of 09/22/2020   No Known Allergies      Medication List     STOP taking these medications    traMADol 50 MG tablet Commonly known as: ULTRAM       TAKE these medications    acetaminophen 325 MG tablet Commonly known as: TYLENOL Take 1-2 tablets (325-650 mg total) by mouth every 4 (four) hours as needed for mild pain.   celecoxib 200 MG capsule Commonly known as: CELEBREX Take 1 capsule (200 mg total) by mouth daily.   diclofenac Sodium 1 % Gel Commonly known as: VOLTAREN Apply 2 g topically 4 (four) times daily.   Eliquis 5 MG Tabs tablet Generic drug: apixaban Take 1 tablet (5 mg total) by mouth 2 (two) times daily.   methocarbamol 500 MG tablet Commonly known as:  ROBAXIN Take 2 tablets (1,000 mg total) by mouth 3 (three) times daily.   multivitamin with minerals Tabs tablet Take 1 tablet by mouth daily.   nutrition supplement (JUVEN) Pack Take 1 packet by mouth 2 (two) times daily between meals.   oxyCODONE 5 MG immediate release tablet--Rx# 30 pills.  Commonly known as: Oxy IR/ROXICODONE Take 1-2 tablets (5-10 mg total) by mouth every 6 (six) hours as needed for severe pain or moderate pain ('5mg'$  for moderate pain, '10mg'$  for severe pain).   pantoprazole 40 MG tablet Commonly known as: PROTONIX Take 1 tablet (40 mg total) by mouth 2 (two) times daily.   polyethylene glycol powder  17 GM/SCOOP powder Commonly known as: GLYCOLAX/MIRALAX Dissolve 1 capful (17 g) in water and drink (two) times daily.   traZODone 50 MG tablet Commonly known as: DESYREL Take 0.5-1 tablets (25-50 mg total) by mouth at bedtime as needed for sleep.   vitamin C 500 MG tablet Commonly known as: ASCORBIC ACID Take 1 tablet (500 mg total) by mouth 2 (two) times daily.   Vitamin D (Ergocalciferol) 1.25 MG (50000 UNIT) Caps capsule Commonly known as: DRISDOL Take 1 capsule (50,000 Units total) by mouth every 7 (seven) days.   Zinc Sulfate 220 (50 Zn) MG Tabs Take 1 tablet (220 mg total) by mouth daily.        Follow-up Information     Raulkar, Clide Deutscher, MD Follow up.   Specialty: Physical Medicine and Rehabilitation Why: 10/23/20 please arrive at 10:20am for 10:40am appointment Contact information: [address] [place] [address] [phone]         [name], MD. Call.   Specialty: Internal Medicine Why: for post hospital follow up Contact information: [place], [place] [address] [phone]         Paralee Cancel, MD. Call.   Specialty: Orthopedic Surgery Why: for follow up on joint pain Contact information: [address] [place] [address] [phone]         Kinsinger, Arta Bruce, MD. Call.   Specialty: General Surgery Why: for post op check Contact information: [address] [place] [address] [phone]         [facility]              Follow up on 10/28/2020.   Why: Appointment @ 9:00 Am Contact information: [place]. [place] [phone] [place] [facility] Follow up.   Specialty: Behavioral Health Contact information: [place] [phone]                Signed: Bary Leriche 09/27/2020, 9:00 PM

## 2020-09-25 ENCOUNTER — Encounter: Payer: Managed Care, Other (non HMO) | Admitting: Physical Medicine and Rehabilitation

## 2020-09-28 NOTE — Progress Notes (Signed)
Late note: per care management 3+ week of dressing supplies were given to patient's wife --2 full boxes of 4 X 4, 2 boxes of ABD pads, 2 bottles of saline, roll of hypafix as well as syringes to flush wound (as patient does not want to use water) as well as #10 colostomy supplies. All were taken home prior to d/c and additional few days supplies given by charge nurse at discharge.

## 2020-09-29 ENCOUNTER — Other Ambulatory Visit (HOSPITAL_COMMUNITY): Payer: Self-pay

## 2020-09-30 ENCOUNTER — Telehealth (HOSPITAL_COMMUNITY): Payer: Self-pay | Admitting: Pharmacist

## 2020-09-30 ENCOUNTER — Other Ambulatory Visit (HOSPITAL_COMMUNITY): Payer: Self-pay

## 2020-09-30 NOTE — Telephone Encounter (Signed)
Pharmacy Transitions of Care Follow-up Telephone Call  Date of discharge: 09/22/20  Discharge Diagnosis: Admitted for debility but found to have DVT requiring eliquis  How have you been since you were released from the hospital?  Overall well  Medication changes made at discharge:  - START:  acetaminophen (TYLENOL)  celecoxib (CELEBREX)  diclofenac Sodium (VOLTAREN)  Eliquis (apixaban)  methocarbamol (ROBAXIN)  multivitamin with minerals  nutrition supplement (JUVEN)  oxyCODONE (Oxy IR/ROXICODONE)  pantoprazole (PROTONIX)  polyethylene glycol powder (GLYCOLAX/MIRALAX)  traZODone (DESYREL)  vitamin C (ASCORBIC ACID)  Vitamin D (Ergocalciferol) (DRISDOL)  Zinc Sulfate   - STOPPED: Tramadol  - CHANGED: n/a  Medication changes verified by the patient?  Yes, pt was not at home at the time but says he follows the instructions on the bottles.  We have reviewed instructions specifically for eliquis and the NSAIDs since he is also on Eliquis.     Medication Accessibility:  Home Pharmacy: Pt will have MD send refills to home pharmacy  Was the patient provided with refills on discharged medications? no   Have all prescriptions been transferred from [facility] to home pharmacy? N/a   Is the patient able to afford medications? [employer] plan Notable copays: eliquis ($50/mo) Eligible patient assistance: MAP possible    Medication Review:  APIXABAN (ELIQUIS)  Apixaban '5mg'$  BID initiated while inpatient but continued 09/22/20 at discharge.  - Length of therapy is unclear, not included in discharge summary.  Pt reports that MD will let him know when to stop and he will discuss this at upcoming appointment - Discussed importance of taking medication around the same time everyday  - Reviewed potential DDIs with patient  - Advised patient of medications to avoid (NSAIDs, ASA)  - Educated that Tylenol (acetaminophen) will be the preferred analgesic to prevent risk of bleeding  - Emphasized  importance of monitoring for signs and symptoms of bleeding (abnormal bruising, prolonged bleeding, nose bleeds, bleeding from gums, discolored urine, black tarry stools)  - Advised patient to alert all providers of anticoagulation therapy prior to starting a new medication or having a procedure    Follow-up Appointments:  PCP Hospital f/u appt confirmed? Scheduled for week of 9/5 but pt was not at home and could not remember the exact day.   [facility] f/u appt confirmed? Physical medicine and Rehab. Scheduled to see Dr. Ranell Patrick on 9/23 @ 10:40.   If their condition worsens, is the pt aware to call PCP or go to the Emergency Dept.? yes  Final Patient Assessment:  Pt was friendly and appreciated the call.  He was not at home at the time of the call but felt certain that he is following all medication instructions as written and has all appointments scheduled.  We reviewed eliquis and the upcoming appointments listed in Epic.

## 2020-10-08 ENCOUNTER — Other Ambulatory Visit (HOSPITAL_COMMUNITY): Payer: Self-pay

## 2020-10-08 ENCOUNTER — Telehealth: Payer: Self-pay

## 2020-10-08 NOTE — Telephone Encounter (Signed)
Patient wife called stating they are having trouble finding Juven and he is almost out. Please advise.

## 2020-10-09 NOTE — Telephone Encounter (Signed)
Wife notified and she agreed.

## 2020-10-16 ENCOUNTER — Encounter (HOSPITAL_COMMUNITY)
Admission: RE | Admit: 2020-10-16 | Discharge: 2020-10-16 | Disposition: A | Discharge status: Home / Self Care | Payer: Managed Care, Other (non HMO) | Source: Ambulatory Visit | Attending: Nurse Practitioner | Admitting: Nurse Practitioner

## 2020-10-16 ENCOUNTER — Other Ambulatory Visit: Payer: Self-pay

## 2020-10-16 DIAGNOSIS — K9400 Colostomy complication, unspecified: Secondary | ICD-10-CM | POA: Diagnosis not present

## 2020-10-16 DIAGNOSIS — Z01812 Encounter for preprocedural laboratory examination: Secondary | ICD-10-CM | POA: Insufficient documentation

## 2020-10-16 DIAGNOSIS — Z933 Colostomy status: Secondary | ICD-10-CM | POA: Insufficient documentation

## 2020-10-16 NOTE — Discharge Instructions (Signed)
Switch to 1 piece convex Let me know if you like it. ([facility] office) Can switch to coloplast Do you like barrier strips? Ostomy clinic [phone]

## 2020-10-16 NOTE — Progress Notes (Signed)
[facility]   Reason for visit:  LLQ colostomy and midline abdominal surgical incision.  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       Midline abdominal surgical wound, healing.   All other systems reviewed and are negative. Vital signs:  BP (!) 155/88 (BP Location: Right Arm)   Pulse 87   Temp (!) 97.5 F (36.4 C) (Oral)   Resp 20   Ht '5\' 10"'$  (1.778 m)   Wt 72.6 kg   SpO2 100%   BMI 22.96 kg/m  Exam:  Physical Exam Vitals reviewed.  Constitutional:      Appearance: Normal appearance. He is normal weight.  Abdominal:     General: Abdomen is flat.     Comments: Slightly budded stoma with peristomal creasing   Skin:    General: Skin is warm.  Neurological:     Mental Status: He is alert.  Psychiatric:        Mood and Affect: Mood normal.    Stoma type/location:  LLQ colostomy Stomal assessment/size:  1 3/8" pink and moist, only slightly budded.  Peristomal assessment:  creasing and leads to midline incision.  Will implement convexity.  Treatment options for stomal/peristomal skin: convex pouch  needs filtered pouch for flatus as pouch has popped off when full of air.  Output: soft brown stool Ostomy pouching: 1pc.convex  barrier ring, skin prep and stoma powder.  Education provided:  Performed pouch change with partner observing.  Measured and cut to fit, explained rationale for convex pouch and skin prep to protect.  Will update order for supplies with [facility]. Patient has belt at home, we demonstrated how to use this. 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 Samples given of pouch   We perform dressing change to midline wound and I send them home with additional supplies.  Wound is pink and moist, healing.     Impression/dx  Colostomy Discussion  Pouch changed to 1 piece filtered convex pouch with belt.  Plan  Call office  as needed.     Visit time: 45 minutes.   Domenic Moras FNP-BC

## 2020-10-22 ENCOUNTER — Other Ambulatory Visit (HOSPITAL_COMMUNITY): Payer: Self-pay | Admitting: Nurse Practitioner

## 2020-10-22 ENCOUNTER — Encounter (HOSPITAL_COMMUNITY): Payer: Self-pay

## 2020-10-22 DIAGNOSIS — K9400 Colostomy complication, unspecified: Secondary | ICD-10-CM

## 2020-10-23 ENCOUNTER — Ambulatory Visit (HOSPITAL_COMMUNITY)
Admission: RE | Admit: 2020-10-23 | Discharge: 2020-10-23 | Disposition: A | Discharge status: Home / Self Care | Payer: Managed Care, Other (non HMO) | Source: Ambulatory Visit | Attending: Nurse Practitioner | Admitting: Nurse Practitioner

## 2020-10-23 ENCOUNTER — Encounter
Payer: Managed Care, Other (non HMO) | Attending: Physical Medicine and Rehabilitation | Admitting: Physical Medicine and Rehabilitation

## 2020-10-23 ENCOUNTER — Other Ambulatory Visit: Payer: Self-pay

## 2020-10-23 VITALS — BP 138/89 | HR 83 | Temp 97.8°F | Ht 70.0 in | Wt 160.0 lb

## 2020-10-23 DIAGNOSIS — R5381 Other malaise: Secondary | ICD-10-CM | POA: Diagnosis not present

## 2020-10-23 DIAGNOSIS — M25571 Pain in right ankle and joints of right foot: Secondary | ICD-10-CM

## 2020-10-23 DIAGNOSIS — Z1321 Encounter for screening for nutritional disorder: Secondary | ICD-10-CM

## 2020-10-23 DIAGNOSIS — I82491 Acute embolism and thrombosis of other specified deep vein of right lower extremity: Secondary | ICD-10-CM | POA: Diagnosis not present

## 2020-10-23 DIAGNOSIS — K9400 Colostomy complication, unspecified: Secondary | ICD-10-CM

## 2020-10-23 DIAGNOSIS — R6520 Severe sepsis without septic shock: Secondary | ICD-10-CM

## 2020-10-23 DIAGNOSIS — K631 Perforation of intestine (nontraumatic): Secondary | ICD-10-CM | POA: Diagnosis not present

## 2020-10-23 DIAGNOSIS — Z933 Colostomy status: Secondary | ICD-10-CM | POA: Insufficient documentation

## 2020-10-23 DIAGNOSIS — A419 Sepsis, unspecified organism: Secondary | ICD-10-CM

## 2020-10-23 DIAGNOSIS — Z131 Encounter for screening for diabetes mellitus: Secondary | ICD-10-CM

## 2020-10-23 MED ORDER — ZINC SULFATE 220 (50 ZN) MG PO TABS: 220.0000 mg | ORAL_TABLET | Freq: Every day | ORAL | 0 refills | Status: DC

## 2020-10-23 MED ORDER — OXYCODONE HCL 5 MG PO TABS: 5.0000 mg | ORAL_TABLET | Freq: Four times a day (QID) | ORAL | 0 refills | Status: DC | PRN

## 2020-10-23 MED ORDER — METHOCARBAMOL 500 MG PO TABS: 1000.0000 mg | ORAL_TABLET | Freq: Three times a day (TID) | ORAL | 0 refills | Status: DC

## 2020-10-23 MED ORDER — JUVEN PO PACK: 1.0000 | PACK | Freq: Two times a day (BID) | ORAL | 0 refills | Status: DC

## 2020-10-23 MED ORDER — APIXABAN 5 MG PO TABS: 5.0000 mg | ORAL_TABLET | Freq: Two times a day (BID) | ORAL | 0 refills | Status: DC

## 2020-10-23 MED ORDER — ASCORBIC ACID 500 MG PO TABS: 500.0000 mg | ORAL_TABLET | Freq: Two times a day (BID) | ORAL | 0 refills | Status: DC

## 2020-10-23 NOTE — Discharge Instructions (Signed)
We are trying a smaller two piece pouch today.  I will send in order for filtered pouch.  See me back in 2 weeks.  Appointment attached.

## 2020-10-23 NOTE — Progress Notes (Signed)
[facility]   Reason for visit:  LLQ colostomy with midline abdominal surgical wound HPI:  Perforated diverticulum with LLQ end colostomy ROS  Review of Systems Vital signs:  BP 138/88 (BP Location: Right Arm)   Pulse 87   Temp 97.7 F (36.5 C) (Oral)   Resp 18   Ht 5\' 10"  (1.778 m)   Wt 72.5 kg   SpO2 100%   BMI 22.93 kg/m  Exam:  Physical Exam  Stoma type/location:  LLQ colostomy Stomal assessment/size:  1 1/4" pink and moist, slightly budded Peristomal assessment:  intact skin midline abdominal  Treatment options for stomal/peristomal skin: The convex pouch would not stay in place.  Back to 2 piece pouch   Switching to smaller pouch, 2 1/4" pouch with barrier ring and skin prep/powder/needs filtered pouch Output: soft brown stool Ostomy pouching:2pc. 2 1/4" pouch, barrier ring, skin prep and powder with belt Education provided:  We perform another pouch change today with wife observing.  We re-measure stoma and pattern    Impression/dx  Complication of colostomy, leaking pouch Discussion  Switch to 2 piece pouch Plan  New orders sent to [facility] today.   [facility] Nurse wound follow up Wound type:midline abdominal surgical wound, small areas of hypergranulation tissue present.  Being seen at wound care center.  Demonstrated NS damp gauze dressing change.  Not to wet gauze too much.  They had been soaking gauze with NS.   Measurement: Healed except 3- 0.1 cm hypergranulated lesions, 2- 0.1 cm hypergranulated lesions to distal aspect.  Wound bed: see above- hypergranulation tissue noted in wound bed Drainage (amount, consistency, odor)  minimal serosanguinous  no odor.  Periwound:  LLQ colostomy, deep creasing through midline incision/scar.  Dressing procedure/placement/frequency: Dressing change performed.  Encouraged to show wound care MD this area at next appointment to determine if silver nitrate warranted.  Use only damp NS moist gauze.       Visit  time: 75 minutes.   Domenic Moras FNP-BC

## 2020-10-23 NOTE — Patient Instructions (Signed)
HTN: -Advised regarding healthy foods that can help lower blood pressure and provided with a list: 1) citrus foods- high in vitamins and minerals 2) salmon and other fatty fish - reduces inflammation and oxylipins 3) swiss chard (leafy green)- high level of nitrates 4) pumpkin seeds- one of the best natural sources of magnesium 5) Beans and lentils- high in fiber, magnesium, and potassium 6) Berries- high in flavonoids 7) Amaranth (whole grain, can be cooked similarly to rice and oats)- high in magnesium and fiber 8) Pistachios- even more effective at reducing BP than other nuts 9) Carrots- high in phenolic compounds that relax blood vessels and reduce inflammation 10) Celery- contain phthalides that relax tissues of arterial walls 11) Tomatoes- can also improve cholesterol and reduce risk of heart disease 12) Broccoli- good source of magnesium, calcium, and potassium 13) Greek yogurt: high in potassium and calcium 14) Herbs and spices: Celery seed, cilantro, saffron, lemongrass, black cumin, ginseng, cinnamon, cardamom, sweet basil, and ginger 15) Chia and flax seeds- also help to lower cholesterol and blood sugar 16) Beets- high levels of nitrates that relax blood vessels  17) spinach and bananas- high in potassium  -Provided lise of supplements that can help with hypertension:  1) magnesium: one high quality brand is Bioptemizers since it contains all 7 types of magnesium, otherwise over the counter magnesium gluconate 400mg  is a good option 2) B vitamins 3) vitamin D 4) potassium 5) CoQ10 6) L-arginine 7) Vitamin C 8) Beetroot -Educated that goal BP is 120/80. -Made goal to incorporate some of the above foods into diet.    Diabetes: 1) cinnamon- imitates effects of insulin, increasing glucose transport into cells ([place] or [place] cinnamon is best, least processed) 2) nuts- can slow down the blood sugar response of carbohydrate rich foods 3) oatmeal- contains and  anti-inflammatory compound avenanthramide 4) whole-milk yogurt (best types are no sugar, [place] yogurt, or goat/sheep yogurt) 5) beans- high in protein, fiber, and vitamins, low glycemic index 6) broccoli- great source of vitamin A and C 7) quinoa- higher in protein and fiber than other grains 8) spinach- high in vitamin A, fiber, and protein 9) olive oil- reduces glucose levels, LDL, and triglycerides 10) salmon- excellent amount of omega-3-fatty acids 11) walnuts- rich in antioxidants 12) apples- high in fiber and quercetin 13) carrots- highly nutritious with low impact on blood sugar 14) eggs- improve HDL (good cholesterol), high in protein, keep you satiated 15) turmeric: improves blood sugars, cardiovascular disease, and protects kidney health 16) garlic: improves blood sugar, blood pressure, pain 17) tomatoes: highly nutritious with low impact on blood sugar

## 2020-10-23 NOTE — Progress Notes (Signed)
Subjective:    Patient ID: Roger Brennan, male    DOB: 1960-03-03, 60 y.o.   MRN: [id number]  HPI Roger Brennan is a 60 year old man who presents to establish  Abdominal pain is much improved. Doing well with dressing changes.   He had follow-up with surgery. He has another appointment on October 11th. Healing ahead of schedule.   Ostomy doctor said wounds are healing too fast.   Average pain is 1/10.   He does require refills of his medications.   Swelling has been improved.   Seems to be impatient at times.   Pain Inventory Average Pain 1 Pain Right Now 1 My pain is aching  In the last 24 hours, has pain interfered with the following? General activity 2 Relation with others 0 Enjoyment of life 0 What TIME of day is your pain at its worst? morning  Sleep (in general) NA  Pain is worse with: some activites Pain improves with:  na Relief from Meds:  na  walk without assistance ability to climb steps?  yes do you drive?  no  employed # of hrs/week 35  No problems in this area  Any changes since last visit?  no  Any changes since last visit?  no    Family History  Adopted: Yes   Social History   Socioeconomic History   Marital status: Soil scientist    Spouse name: Debra   Number of children: Not on file   Years of education: Not on file   Highest education level: Not on file  Occupational History   Not on file  Tobacco Use   Smoking status: Never    Passive exposure: Never   Smokeless tobacco: Never  Vaping Use   Vaping Use: Never used  Substance and Sexual Activity   Alcohol use: Never   Drug use: Never   Sexual activity: Yes  Other Topics Concern   Not on file  Social History Narrative   Not on file   Social Determinants of Health   Financial Resource Strain: Not on file  Food Insecurity: Not on file  Transportation Needs: Not on file  Physical Activity: Not on file  Stress: Not on file  Social Connections: Not on file    Past Surgical History:  Procedure Laterality Date   ABDOMINAL WALL DEFECT REPAIR N/A 08/15/2020   Procedure: REPAIR ABDOMINAL WALL/REEXPLORATION OF ABDOMINAL WALL, IRRIGATION AND DEBRIDEMENT;  Surgeon: Erroll Luna, MD;  Location: [facility];  Service: General;  Laterality: N/A;   LAPAROTOMY N/A 08/14/2020   Procedure: EXPLORATORY LAPAROTOMY, REPAIR PERFORATED DIVERTICULUM;  Surgeon: Kieth Brightly, Arta Bruce, MD;  Location: [facility];  Service: General;  Laterality: N/A;   PARTIAL COLECTOMY N/A 08/12/2020   Procedure: EXPLORATORY LAPAROTOMY,PARTIAL COLECTOMY;  Surgeon: Mickeal Skinner, MD;  Location: [facility];  Service: General;  Laterality: N/A;   No past medical history on file. BP 138/89   Pulse 83   Temp 97.8 F (36.6 C)   Ht 5\' 10"  (1.778 m)   Wt 160 lb (72.6 kg)   SpO2 98%   BMI 22.96 kg/m   Opioid Risk Score:   Fall Risk Score:  `1  Depression screen PHQ 2/9  Depression screen PHQ 2/9 10/23/2020  Decreased Interest 0  Down, Depressed, Hopeless 0  PHQ - 2 Score 0  Altered sleeping 0  Tired, decreased energy 1  Change in appetite 0  Feeling bad or failure about yourself  0  Trouble concentrating 0  Moving slowly  or fidgety/restless 0  Suicidal thoughts 0  PHQ-9 Score 1     Review of Systems  Constitutional: Negative.   HENT: Negative.    Eyes: Negative.   Respiratory: Negative.    Cardiovascular: Negative.   Gastrointestinal: Negative.   Endocrine: Negative.   Genitourinary: Negative.   Musculoskeletal:        Pain in abd area and right foot by drawing  Skin: Negative.   Allergic/Immunologic: Negative.   Neurological: Negative.   Hematological:  Bruises/bleeds easily.       Eliquis  Psychiatric/Behavioral: Negative.    All other systems reviewed and are negative.     Objective:   Physical Exam Gen: no distress, normal appearing HEENT: oral mucosa pink and moist, NCAT Cardio: Reg rate Chest: normal effort, normal rate of breathing Abd: soft,  non-distended Ext: no edema Psych: pleasant, normal affect Skin: abdominal incision and colostomy dressing C/D/I Neuro: Alert and oriented x3.     Assessment & Plan:  1) Perforation of colon -refilled zinc, vitamin C, and protein supplements to continue to promote healing  2) Diabetes: -check HgbA1c today -avoid sugar, bread, pasta, rice -avoid snacking -try to incorporate into your diet some of the following foods which are good for diabetes: 1) cinnamon- imitates effects of insulin, increasing glucose transport into cells ([place] or [place] cinnamon is best, least processed) 2) nuts- can slow down the blood sugar response of carbohydrate rich foods 3) oatmeal- contains and anti-inflammatory compound avenanthramide 4) whole-milk yogurt (best types are no sugar, [place] yogurt, or goat/sheep yogurt) 5) beans- high in protein, fiber, and vitamins, low glycemic index 6) broccoli- great source of vitamin A and C 7) quinoa- higher in protein and fiber than other grains 8) spinach- high in vitamin A, fiber, and protein 9) olive oil- reduces glucose levels, LDL, and triglycerides 10) salmon- excellent amount of omega-3-fatty acids 11) walnuts- rich in antioxidants 12) apples- high in fiber and quercetin 13) carrots- highly nutritious with low impact on blood sugar 14) eggs- improve HDL (good cholesterol), high in protein, keep you satiated 15) turmeric: improves blood sugars, cardiovascular disease, and protects kidney health 16) garlic: improves blood sugar, blood pressure, pain 17) tomatoes: highly nutritious with low impact on blood sugar   3) HTN: -BP is 138/89 today.  -Advised checking BP daily at home and logging results to bring into follow-up appointment with her PCP and myself. -Reviewed BP meds today.  -advised does not need Lisinopril -Advised regarding healthy foods that can help lower blood pressure and provided with a list: 1) citrus foods- high in vitamins and  minerals 2) salmon and other fatty fish - reduces inflammation and oxylipins 3) swiss chard (leafy green)- high level of nitrates 4) pumpkin seeds- one of the best natural sources of magnesium 5) Beans and lentils- high in fiber, magnesium, and potassium 6) Berries- high in flavonoids 7) Amaranth (whole grain, can be cooked similarly to rice and oats)- high in magnesium and fiber 8) Pistachios- even more effective at reducing BP than other nuts 9) Carrots- high in phenolic compounds that relax blood vessels and reduce inflammation 10) Celery- contain phthalides that relax tissues of arterial walls 11) Tomatoes- can also improve cholesterol and reduce risk of heart disease 12) Broccoli- good source of magnesium, calcium, and potassium 13) Greek yogurt: high in potassium and calcium 14) Herbs and spices: Celery seed, cilantro, saffron, lemongrass, black cumin, ginseng, cinnamon, cardamom, sweet basil, and ginger 15) Chia and flax seeds- also help to lower cholesterol  and blood sugar 16) Beets- high levels of nitrates that relax blood vessels  17) spinach and bananas- high in potassium  -Provided lise of supplements that can help with hypertension:  1) magnesium: one high quality brand is Bioptemizers since it contains all 7 types of magnesium, otherwise over the counter magnesium gluconate 400mg  is a good option 2) B vitamins 3) vitamin D 4) potassium 5) CoQ10 6) L-arginine 7) Vitamin C 8) Beetroot -Educated that goal BP is 120/80. -Made goal to incorporate some of the above foods into diet.    4) Impatience/difficulty coping with condition as was previously very healthy -provided referral to neuropsych  5) DVT: -refilled Eliquis for 1 more month

## 2020-10-24 LAB — VITAMIN D 25 HYDROXY (VIT D DEFICIENCY, FRACTURES): Vit D, 25-Hydroxy: 64.2 ng/mL (ref 30.0–100.0)

## 2020-10-24 LAB — HEMOGLOBIN A1C
Est. average glucose Bld gHb Est-mCnc: 120 mg/dL
Hgb A1c MFr Bld: 5.8 % — ABNORMAL HIGH (ref 4.8–5.6)

## 2020-10-27 ENCOUNTER — Other Ambulatory Visit: Payer: Self-pay

## 2020-10-27 ENCOUNTER — Encounter (HOSPITAL_BASED_OUTPATIENT_CLINIC_OR_DEPARTMENT_OTHER): Payer: Managed Care, Other (non HMO) | Admitting: Physical Medicine and Rehabilitation

## 2020-10-27 DIAGNOSIS — Z1321 Encounter for screening for nutritional disorder: Secondary | ICD-10-CM | POA: Diagnosis not present

## 2020-10-27 DIAGNOSIS — R7303 Prediabetes: Secondary | ICD-10-CM

## 2020-10-27 NOTE — Progress Notes (Signed)
Subjective:    Patient ID: Roger Brennan, male    DOB: May 22, 1960, 60 y.o.   MRN: [id number]  HPI An audio/video tele-health visit is felt to be the most appropriate encounter for this patient at this time. This is a follow up tele-visit via phone. The patient is at home. MD is at office.    Roger Brennan is a 60 year old man who presents for hospital follow-up after CIR admission for severe abdominal infection.   Abdominal pain is much improved. Doing well with dressing changes.   He had follow-up with surgery. He has another appointment on October 11th. Healing ahead of schedule.   Ostomy doctor said wounds are healing too fast.   Average pain is 1/10. He takes one oxycodone sparingly if pain has been severe. Has run out of muscle relaxers and Celebrex and asks if he should still be taking these.   He does require refills of his medications.   Swelling has been improved.   Seems to be impatient at times.   He asks about what medications he should continue   HgbA1c returned at 5.8  Vitamin D level returned at 64  Pain Inventory Average Pain 1 Pain Right Now 1 My pain is aching  In the last 24 hours, has pain interfered with the following? General activity 2 Relation with others 0 Enjoyment of life 0 What TIME of day is your pain at its worst? morning  Sleep (in general) NA  Pain is worse with: some activites Pain improves with:  na Relief from Meds:  na  walk without assistance ability to climb steps?  yes do you drive?  no  employed # of hrs/week 35  No problems in this area  Any changes since last visit?  no  Any changes since last visit?  no    Family History  Adopted: Yes   Social History   Socioeconomic History   Marital status: Soil scientist    Spouse name: Debra   Number of children: Not on file   Years of education: Not on file   Highest education level: Not on file  Occupational History   Not on file  Tobacco Use   Smoking  status: Never    Passive exposure: Never   Smokeless tobacco: Never  Vaping Use   Vaping Use: Never used  Substance and Sexual Activity   Alcohol use: Never   Drug use: Never   Sexual activity: Yes  Other Topics Concern   Not on file  Social History Narrative   Not on file   Social Determinants of Health   Financial Resource Strain: Not on file  Food Insecurity: Not on file  Transportation Needs: Not on file  Physical Activity: Not on file  Stress: Not on file  Social Connections: Not on file   Past Surgical History:  Procedure Laterality Date   ABDOMINAL WALL DEFECT REPAIR N/A 08/15/2020   Procedure: REPAIR ABDOMINAL WALL/REEXPLORATION OF ABDOMINAL WALL, IRRIGATION AND DEBRIDEMENT;  Surgeon: Erroll Luna, MD;  Location: [facility];  Service: General;  Laterality: N/A;   LAPAROTOMY N/A 08/14/2020   Procedure: EXPLORATORY LAPAROTOMY, REPAIR PERFORATED DIVERTICULUM;  Surgeon: Kieth Brightly, Arta Bruce, MD;  Location: [facility];  Service: General;  Laterality: N/A;   PARTIAL COLECTOMY N/A 08/12/2020   Procedure: EXPLORATORY LAPAROTOMY,PARTIAL COLECTOMY;  Surgeon: Mickeal Skinner, MD;  Location: [facility];  Service: General;  Laterality: N/A;   No past medical history on file. There were no vitals taken for this visit.  Opioid Risk Score:   Fall Risk Score:  `1  Depression screen PHQ 2/9  Depression screen PHQ 2/9 10/23/2020  Decreased Interest 0  Down, Depressed, Hopeless 0  PHQ - 2 Score 0  Altered sleeping 0  Tired, decreased energy 1  Change in appetite 0  Feeling bad or failure about yourself  0  Trouble concentrating 0  Moving slowly or fidgety/restless 0  Suicidal thoughts 0  PHQ-9 Score 1     Review of Systems  Constitutional: Negative.   HENT: Negative.    Eyes: Negative.   Respiratory: Negative.    Cardiovascular: Negative.   Gastrointestinal: Negative.   Endocrine: Negative.   Genitourinary: Negative.   Musculoskeletal:        Pain in abd area and right  foot by drawing  Skin: Negative.   Allergic/Immunologic: Negative.   Neurological: Negative.   Hematological:  Bruises/bleeds easily.       Eliquis  Psychiatric/Behavioral: Negative.    All other systems reviewed and are negative.     Objective:   Physical Exam Not performed as patient was seen via phone     Assessment & Plan:  1) Perforation of colon -refilled zinc, vitamin C, and protein supplements to continue to promote healing  2) Diabetes: -HgbA1c returned at 5.8- discussed that this is considered prediabetes and is much better than his level of 7.4 in the summer! -Commended on his progress! -Advised to avoid added sugar especially in drinks.  -Advised to eat protein and vegetables first when eating meals -Recommended 1 glass water with 1 TB apple cider vinegar before meals to reduce CBG spike, has additional health benefits, drink with straw to protect enamel.   -avoid sugar, bread, pasta, rice -avoid snacking -try to incorporate into your diet some of the following foods which are good for diabetes: 1) cinnamon- imitates effects of insulin, increasing glucose transport into cells ([place] or [place] cinnamon is best, least processed) 2) nuts- can slow down the blood sugar response of carbohydrate rich foods 3) oatmeal- contains and anti-inflammatory compound avenanthramide 4) whole-milk yogurt (best types are no sugar, [place] yogurt, or goat/sheep yogurt) 5) beans- high in protein, fiber, and vitamins, low glycemic index 6) broccoli- great source of vitamin A and C 7) quinoa- higher in protein and fiber than other grains 8) spinach- high in vitamin A, fiber, and protein 9) olive oil- reduces glucose levels, LDL, and triglycerides 10) salmon- excellent amount of omega-3-fatty acids 11) walnuts- rich in antioxidants 12) apples- high in fiber and quercetin 13) carrots- highly nutritious with low impact on blood sugar 14) eggs- improve HDL (good cholesterol), high in  protein, keep you satiated 15) turmeric: improves blood sugars, cardiovascular disease, and protects kidney health 16) garlic: improves blood sugar, blood pressure, pain 17) tomatoes: highly nutritious with low impact on blood sugar   3) HTN: -BP is 138/89 prior visit -Advised checking BP daily at home and logging results to bring into follow-up appointment with her PCP and myself. -Off all BP meds currently -advised does not need Lisinopril -Advised regarding healthy foods that can help lower blood pressure and provided with a list: 1) citrus foods- high in vitamins and minerals 2) salmon and other fatty fish - reduces inflammation and oxylipins 3) swiss chard (leafy green)- high level of nitrates 4) pumpkin seeds- one of the best natural sources of magnesium 5) Beans and lentils- high in fiber, magnesium, and potassium 6) Berries- high in flavonoids 7) Amaranth (whole grain, can be cooked similarly  to rice and oats)- high in magnesium and fiber 8) Pistachios- even more effective at reducing BP than other nuts 9) Carrots- high in phenolic compounds that relax blood vessels and reduce inflammation 10) Celery- contain phthalides that relax tissues of arterial walls 11) Tomatoes- can also improve cholesterol and reduce risk of heart disease 12) Broccoli- good source of magnesium, calcium, and potassium 13) Greek yogurt: high in potassium and calcium 14) Herbs and spices: Celery seed, cilantro, saffron, lemongrass, black cumin, ginseng, cinnamon, cardamom, sweet basil, and ginger 15) Chia and flax seeds- also help to lower cholesterol and blood sugar 16) Beets- high levels of nitrates that relax blood vessels  17) spinach and bananas- high in potassium  -Provided lise of supplements that can help with hypertension:  1) magnesium: one high quality brand is Bioptemizers since it contains all 7 types of magnesium, otherwise over the counter magnesium gluconate 400mg  is a good option 2) B  vitamins 3) vitamin D 4) potassium 5) CoQ10 6) L-arginine 7) Vitamin C 8) Beetroot -Educated that goal BP is 120/80. -Made goal to incorporate some of the above foods into diet.    4) Impatience/difficulty coping with condition as was previously very healthy -provided referral to neuropsych  5) DVT: -refilled Eliquis for 1 more month  6) Vitamin D deficiency: -advised that Vitamin D level normalized -continue to spent time in son.   6 minutes spent in discussion of HgbA1c and Vitamin D level, strategies to continue to improve blood sugar control

## 2020-10-28 ENCOUNTER — Encounter (HOSPITAL_BASED_OUTPATIENT_CLINIC_OR_DEPARTMENT_OTHER): Payer: Managed Care, Other (non HMO) | Attending: Physician Assistant | Admitting: Physician Assistant

## 2020-10-28 ENCOUNTER — Other Ambulatory Visit: Payer: Self-pay

## 2020-10-28 DIAGNOSIS — Z933 Colostomy status: Secondary | ICD-10-CM | POA: Insufficient documentation

## 2020-10-28 DIAGNOSIS — Z86718 Personal history of other venous thrombosis and embolism: Secondary | ICD-10-CM | POA: Diagnosis not present

## 2020-10-28 DIAGNOSIS — L98492 Non-pressure chronic ulcer of skin of other sites with fat layer exposed: Secondary | ICD-10-CM | POA: Diagnosis not present

## 2020-10-28 DIAGNOSIS — Z7901 Long term (current) use of anticoagulants: Secondary | ICD-10-CM | POA: Insufficient documentation

## 2020-10-28 NOTE — Progress Notes (Signed)
CALEB, DECOCK ([id number]) Visit Report for 10/28/2020 Abuse/Suicide Risk Screen Details Patient Name: Date of Service: [facility] 10/28/2020 9:00 A M Medical Record Number: [id number] Patient Account Number: [id number] Date of Birth/Sex: Treating RN: 08-Jun-1960 (60 y.o. [name] Primary Care Jaydence Arnesen: [name] Other Clinician: Referring Kirin Pastorino: Treating Hulet Ehrmann/Extender: [name] in Treatment: 0 Abuse/Suicide Risk Screen Items Answer ABUSE RISK SCREEN: Has anyone close to you tried to hurt or harm you recentlyo No Do you feel uncomfortable with anyone in your familyo No Has anyone forced you do things that you didnt want to doo No Electronic Signature(s) Signed: 10/28/2020 5:46:28 PM By: Baruch Gouty RN, BSN Entered By: Baruch Gouty on 10/28/2020 09:23:33 -------------------------------------------------------------------------------- Activities of Daily Living Details Patient Name: Date of Service: [facility] 10/28/2020 9:00 [place] Record Number: [id number] Patient Account Number: [id number] Date of Birth/Sex: Treating RN: 10-Aug-1960 (59 y.o. [name] Primary Care Nattalie Santiesteban: [name] Other Clinician: Referring Atwood Adcock: Treating Quintavia Rogstad/Extender: [name] in Treatment: 0 Activities of Daily Living Items Answer Activities of Daily Living (Please select one for each item) Drive Automobile Completely Able T Medications ake Completely Able Use T elephone Completely Able Care for Appearance Completely Able Use T oilet Completely Able Bath / Shower Completely Able Dress Self Completely Able Feed Self Completely Able Walk Completely Able Get In / Out Bed Completely Able Housework Completely Able Prepare Meals Completely Able Handle Money Completely Able Shop for Self Completely Able Electronic Signature(s) Signed: 10/28/2020 5:46:28 PM By: Baruch Gouty  RN, BSN Entered By: Baruch Gouty on 10/28/2020 09:23:53 -------------------------------------------------------------------------------- Education Screening Details Patient Name: Date of Service: [facility] 10/28/2020 9:00 [place] Record Number: [id number] Patient Account Number: [id number] Date of Birth/Sex: Treating RN: 12-Jan-1961 (60 y.o. [name] Primary Care Loriel Diehl: [name] Other Clinician: Referring Saydi Kobel: Treating Kaya Klausing/Extender: [name] in Treatment: 0 Primary Learner Assessed: Patient Learning Preferences/Education Level/Primary Language Learning Preference: Explanation, Demonstration, Printed Material Highest Education Level: College or Above Preferred Language: English Cognitive Barrier Language Barrier: No Translator Needed: No Memory Deficit: No Emotional Barrier: No Cultural/Religious Beliefs Affecting Medical Care: No Physical Barrier Impaired Vision: No Impaired Hearing: No Decreased Hand dexterity: No Knowledge/Comprehension Knowledge Level: High Comprehension Level: High Ability to understand written instructions: High Ability to understand verbal instructions: High Motivation Anxiety Level: Calm Cooperation: Cooperative Education Importance: Acknowledges Need Interest in Health Problems: Asks Questions Perception: Coherent Willingness to Engage in Self-Management High Activities: Readiness to Engage in Self-Management High Activities: Electronic Signature(s) Signed: 10/28/2020 5:46:28 PM By: Baruch Gouty RN, BSN Entered By: Baruch Gouty on 10/28/2020 09:24:15 -------------------------------------------------------------------------------- Fall Risk Assessment Details Patient Name: Date of Service: [facility] 10/28/2020 9:00 A M Medical Record Number: [id number] Patient Account Number: [id number] Date of Birth/Sex: Treating RN: Jun 10, 1960 (60 y.o. [name] Primary Care Rosamaria Donn: [name] Other Clinician: Referring Jaskirat Schwieger: Treating Estephani Popper/Extender: [name] in Treatment: 0 Fall Risk Assessment Items Have you had 2 or more falls in the last 12 monthso 0 No Have you had any fall that resulted in injury in the last 12 monthso 0 No FALLS RISK SCREEN History of falling - immediate or within 3 months 0 No Secondary diagnosis (Do you have 2 or more medical diagnoseso) 0 No Ambulatory aid None/bed rest/wheelchair/nurse 0 Yes Crutches/cane/walker 0 No Furniture 0 No Intravenous therapy Access/Saline/Heparin Lock 0 No Gait/Transferring Normal/ bed  rest/ wheelchair 0 Yes Weak (short steps with or without shuffle, stooped but able to lift head while walking, may seek 0 No support from furniture) Impaired (short steps with shuffle, may have difficulty arising from chair, head down, impaired 0 No balance) Mental Status Oriented to own ability 0 Yes Electronic Signature(s) Signed: 10/28/2020 5:46:28 PM By: Baruch Gouty RN, BSN Entered By: Baruch Gouty on 10/28/2020 09:24:25 -------------------------------------------------------------------------------- Foot Assessment Details Patient Name: Date of Service: [facility] 10/28/2020 9:00 [place] Record Number: [id number] Patient Account Number: [id number] Date of Birth/Sex: Treating RN: 07/11/1960 (60 y.o. [name] Primary Care Asma Boldon: [name] Other Clinician: Referring Kelvis Berger: Treating Kenrick Pore/Extender: [name] in Treatment: 0 Foot Assessment Items Site Locations + = Sensation present, - = Sensation absent, C = Callus, U = Ulcer R = Redness, W = Warmth, M = Maceration, PU = Pre-ulcerative lesion F = Fissure, S = Swelling, D = Dryness Assessment Right: Left: Other Deformity: No No Prior Foot Ulcer: No No Prior Amputation: No No Charcot Joint: No No Ambulatory Status:  Ambulatory Without Help Gait: Steady Electronic Signature(s) Signed: 10/28/2020 5:46:28 PM By: Baruch Gouty RN, BSN Entered By: Baruch Gouty on 10/28/2020 09:26:48 -------------------------------------------------------------------------------- Nutrition Risk Screening Details Patient Name: Date of Service: [facility] 10/28/2020 9:00 [place] Record Number: [id number] Patient Account Number: [id number] Date of Birth/Sex: Treating RN: Nov 07, 1960 (60 y.o. [name] Primary Care Providence Stivers: [name] Other Clinician: Referring Syeda Prickett: Treating Cheyanne Lamison/Extender: [name] in Treatment: 0 Height (in): 70 Weight (lbs): 162 Body Mass Index (BMI): 23.2 Nutrition Risk Screening Items Score Screening NUTRITION RISK SCREEN: I have an illness or condition that made me change the kind and/or amount of food I eat 0 No I eat fewer than two meals per day 0 No I eat few fruits and vegetables, or milk products 0 No I have three or more drinks of beer, liquor or wine almost every day 0 No I have tooth or mouth problems that make it hard for me to eat 0 No I don't always have enough money to buy the food I need 0 No I eat alone most of the time 0 No I take three or more different prescribed or over-the-counter drugs a day 0 No Without wanting to, I have lost or gained 10 pounds in the last six months 2 Yes I am not always physically able to shop, cook and/or feed myself 0 No Nutrition Protocols Good Risk Protocol 0 No interventions needed Moderate Risk Protocol High Risk Proctocol Risk Level: Good Risk Score: 2 Electronic Signature(s) Signed: 10/28/2020 5:46:28 PM By: Baruch Gouty RN, BSN Entered By: Baruch Gouty on 10/28/2020 09:26:40

## 2020-10-28 NOTE — Progress Notes (Signed)
ACEL, NATZKE ([id number]) Visit Report for 10/28/2020 Allergy List Details Patient Name: Date of Service: Roger Brennan [facility] 10/28/2020 9:00 [place] Record Number: [id number] Patient Account Number: [id number] Date of Birth/Sex: Treating RN: August 07, 1960 (60 y.o. [name] Primary Care Sayward Horvath: [name] Other Clinician: Referring Hevin Jeffcoat: Treating Jerrianne Hartin/Extender: [name] in Treatment: 0 Allergies Active Allergies No Known Allergies Allergy Notes Electronic Signature(s) Signed: 10/28/2020 5:46:28 PM By: Baruch Gouty RN, BSN Entered By: Baruch Gouty on 10/28/2020 09:14:02 -------------------------------------------------------------------------------- Arrival Information Details Patient Name: Date of Service: Roger Brennan [facility] 10/28/2020 9:00 [place] Record Number: [id number] Patient Account Number: [id number] Date of Birth/Sex: Treating RN: 1960/12/22 (60 y.o. [name] Primary Care Braxton Vantrease: [name] Other Clinician: Referring Jahzion Brogden: Treating Blonnie Maske/Extender: [name] in Treatment: 0 Visit Information Patient Arrived: Ambulatory Arrival Time: 09:03 Accompanied By: spouse Transfer Assistance: None Patient Identification Verified: Yes Secondary Verification Process Completed: Yes Patient Requires Transmission-Based Precautions: No Patient Has Alerts: No Electronic Signature(s) Signed: 10/28/2020 5:46:28 PM By: Baruch Gouty RN, BSN Entered By: Baruch Gouty on 10/28/2020 09:09:49 -------------------------------------------------------------------------------- Clinic Level of Care Assessment Details Patient Name: Date of Service: Roger Brennan [facility] 10/28/2020 9:00 [place] Record Number: [id number] Patient Account Number: [id number] Date of Birth/Sex: Treating RN: October 18, 1960 (60 y.o. [name] Primary Care Renesmay Nesbitt: [name] Other  Clinician: Referring Django Nguyen: Treating Iliyah Bui/Extender: [name] in Treatment: 0 Clinic Level of Care Assessment Items TOOL 2 Quantity Score []  - 0 Use when only an EandM is performed on the INITIAL visit ASSESSMENTS - Nursing Assessment / Reassessment X- 1 20 General Physical Exam (combine w/ comprehensive assessment (listed just below) when performed on new pt. evals) X- 1 25 Comprehensive Assessment (HX, ROS, Risk Assessments, Wounds Hx, etc.) ASSESSMENTS - Wound and Skin A ssessment / Reassessment []  - 0 Simple Wound Assessment / Reassessment - one wound X- 2 5 Complex Wound Assessment / Reassessment - multiple wounds []  - 0 Dermatologic / Skin Assessment (not related to wound area) ASSESSMENTS - Ostomy and/or Continence Assessment and Care []  - 0 Incontinence Assessment and Management []  - 0 Ostomy Care Assessment and Management (repouching, etc.) PROCESS - Coordination of Care X - Simple Patient / Family Education for ongoing care 1 15 []  - 0 Complex (extensive) Patient / Family Education for ongoing care X- 1 10 Staff obtains Programmer, systems, Records, T Results / Process Orders est X- 1 10 Staff telephones HHA, Nursing Homes / Clarify orders / etc []  - 0 Routine Transfer to another Facility (non-emergent condition) []  - 0 Routine Hospital Admission (non-emergent condition) []  - 0 New Admissions / Biomedical engineer / Ordering NPWT Apligraf, etc. , []  - 0 Emergency Hospital Admission (emergent condition) X- 1 10 Simple Discharge Coordination []  - 0 Complex (extensive) Discharge Coordination PROCESS - Special Needs []  - 0 Pediatric / Minor Patient Management []  - 0 Isolation Patient Management []  - 0 Hearing / Language / Visual special needs []  - 0 Assessment of Community assistance (transportation, D/C planning, etc.) []  - 0 Additional assistance / Altered mentation []  - 0 Support Surface(s) Assessment (bed, cushion,  seat, etc.) INTERVENTIONS - Wound Cleansing / Measurement X- 1 5 Wound Imaging (photographs - any number of wounds) []  - 0 Wound Tracing (instead of photographs) []  - 0 Simple Wound Measurement - one wound X- 2 5 Complex Wound Measurement - multiple wounds []  - 0 Simple Wound Cleansing - one  wound X- 2 5 Complex Wound Cleansing - multiple wounds INTERVENTIONS - Wound Dressings X - Small Wound Dressing one or multiple wounds 2 10 []  - 0 Medium Wound Dressing one or multiple wounds []  - 0 Large Wound Dressing one or multiple wounds []  - 0 Application of Medications - injection INTERVENTIONS - Miscellaneous []  - 0 External ear exam []  - 0 Specimen Collection (cultures, biopsies, blood, body fluids, etc.) []  - 0 Specimen(s) / Culture(s) sent or taken to Lab for analysis []  - 0 Patient Transfer (multiple staff / Harrel Lemon Lift / Similar devices) []  - 0 Simple Staple / Suture removal (25 or less) []  - 0 Complex Staple / Suture removal (26 or more) []  - 0 Hypo / Hyperglycemic Management (close monitor of Blood Glucose) []  - 0 Ankle / Brachial Index (ABI) - do not check if billed separately Has the patient been seen at the hospital within the last three years: Yes Total Score: 145 Level Of Care: New/Established - Level 4 Electronic Signature(s) Signed: 10/28/2020 5:46:28 PM By: Baruch Gouty RN, BSN Entered By: Baruch Gouty on 10/28/2020 10:18:25 -------------------------------------------------------------------------------- Encounter Discharge Information Details Patient Name: Date of Service: Roger Brennan HN 10/28/2020 9:00 [place] Record Number: [id number] Patient Account Number: [id number] Date of Birth/Sex: Treating RN: [birth year] (60 y.o. [name] Primary Care Rumi Kolodziej: [name] Other Clinician: Referring Gilda Abboud: Treating Sherryn Pollino/Extender: [name] in Treatment: 0 Encounter Discharge Information Items Post  Procedure Vitals Discharge Condition: Stable Temperature (F): 97.5 Ambulatory Status: Ambulatory Pulse (bpm): 78 Discharge Destination: Home Respiratory Rate (breaths/min): 18 Transportation: Private Auto Blood Pressure (mmHg): 145/95 Accompanied By: spouse Schedule Follow-up Appointment: Yes Clinical Summary of Care: Patient Declined Electronic Signature(s) Signed: 10/28/2020 5:46:28 PM By: Baruch Gouty RN, BSN Entered By: Baruch Gouty on 10/28/2020 10:31:30 -------------------------------------------------------------------------------- Lower Extremity Assessment Details Patient Name: Date of Service: Roger Brennan HN 10/28/2020 9:00 [place] Record Number: [id number] Patient Account Number: [id number] Date of Birth/Sex: Treating RN: 1960-09-13 (60 y.o. [name] Primary Care Andersen Mckiver: [name] Other Clinician: Referring Hillery Bhalla: Treating Keelyn Fjelstad/Extender: [name] in Treatment: 0 Electronic Signature(s) Signed: 10/28/2020 5:46:28 PM By: Baruch Gouty RN, BSN Signed: 10/28/2020 5:46:28 PM By: Baruch Gouty RN, BSN Entered By: Baruch Gouty on 10/28/2020 09:27:07 -------------------------------------------------------------------------------- [place] Details Patient Name: Date of Service: Roger Brennan HN 10/28/2020 9:00 [facility] Medical Record Number: [id number] Patient Account Number: [id number] Date of Birth/Sex: Treating RN: 02-26-1960 (59 y.o. [name] Primary Care Pablo Stauffer: [name] Other Clinician: Referring Sian Joles: Treating Jentry Mcqueary/Extender: [name] in Treatment: [place] reviewed with physician Active Inactive Wound/Skin Impairment Nursing Diagnoses: Impaired tissue integrity Knowledge deficit related to ulceration/compromised skin integrity Goals: Patient/caregiver will verbalize understanding of skin care  regimen Date Initiated: 10/28/2020 Target Resolution Date: 11/25/2020 Goal Status: Active Ulcer/skin breakdown will have a volume reduction of 30% by week 4 Date Initiated: 10/28/2020 Target Resolution Date: 10/28/2020 Goal Status: Active Interventions: Assess patient/caregiver ability to obtain necessary supplies Assess patient/caregiver ability to perform ulcer/skin care regimen upon admission and as needed Assess ulceration(s) every visit Provide education on ulcer and skin care Treatment Activities: Skin care regimen initiated : 10/28/2020 Topical wound management initiated : 10/28/2020 Notes: Electronic Signature(s) Signed: 10/28/2020 5:46:28 PM By: Baruch Gouty RN, BSN Entered By: Baruch Gouty on 10/28/2020 09:47:28 -------------------------------------------------------------------------------- Pain Assessment Details Patient Name: Date of Service: Roger Brennan HN 10/28/2020 9:00 [place]  Record Number: [id number] Patient Account Number: [id number] Date of Birth/Sex: Treating RN: 1960/12/15 (60 y.o. [name] Primary Care Nyemah Watton: [name] Other Clinician: Referring Christianjames Soule: Treating Icarus Partch/Extender: [name] in Treatment: 0 Active Problems Location of Pain Severity and Description of Pain Patient Has Paino No Patient Has Paino No Site Locations Rate the pain. Current Pain Level: 0 Pain Management and Medication Current Pain Management: Electronic Signature(s) Signed: 10/28/2020 5:46:28 PM By: Baruch Gouty RN, BSN Entered By: Baruch Gouty on 10/28/2020 09:39:52 -------------------------------------------------------------------------------- Patient/Caregiver Education Details Patient Name: Date of Service: Roger Brennan HN 9/28/2022andnbsp9:00 A M Medical Record Number: [id number] Patient Account Number: [id number] Date of Birth/Gender: Treating RN: [birth year] (60 y.o. [name] Primary Care  Physician: [name] Other Clinician: Referring Physician: Treating Physician/Extender: [name] in Treatment: 0 Education Assessment Education Provided To: Patient Education Topics Provided [place]: o Handouts: Welcome T [facility] [place] o Methods: Explain/Verbal, Printed Responses: Reinforcements needed, State content correctly Wound/Skin Impairment: Handouts: Caring for Your Ulcer, Skin Care Do's and Dont's Methods: Explain/Verbal, Printed Responses: Reinforcements needed, State content correctly Electronic Signature(s) Signed: 10/28/2020 5:46:28 PM By: Baruch Gouty RN, BSN Entered By: Baruch Gouty on 10/28/2020 09:48:06 -------------------------------------------------------------------------------- Wound Assessment Details Patient Name: Date of Service: Roger Brennan HN 10/28/2020 9:00 [place] Record Number: [id number] Patient Account Number: [id number] Date of Birth/Sex: Treating RN: Jun 25, 1960 (60 y.o. [name] Primary Care Kasem Mozer: [name] Other Clinician: Referring Jaana Brodt: Treating Rebekkah Powless/Extender: [name] in Treatment: 0 Wound Status Wound Number: 1 Primary Etiology: Open Surgical Wound Wound Location: Abdomen - midline Wound Status: Open Wounding Event: Surgical Injury Comorbid History: Deep Vein Thrombosis, Gout Date Acquired: 08/15/2020 Weeks Of Treatment: 0 Clustered Wound: No Photos Wound Measurements Length: (cm) 1.9 Width: (cm) 0.3 Depth: (cm) 0.1 Area: (cm) 0.448 Volume: (cm) 0.045 % Reduction in Area: 0% % Reduction in Volume: 0% Epithelialization: Small (1-33%) Tunneling: No Undermining: No Wound Description Classification: Full Thickness Without Exposed Support Structures Wound Margin: Flat and Intact Exudate Amount: Small Exudate Type: Serosanguineous Exudate Color: red, brown Foul Odor After Cleansing:  No Slough/Fibrino No Wound Bed Granulation Amount: Large (67-100%) Exposed Structure Granulation Quality: Red, Hyper-granulation Fascia Exposed: No Necrotic Amount: None Present (0%) Fat Layer (Subcutaneous Tissue) Exposed: Yes Tendon Exposed: No Muscle Exposed: No Joint Exposed: No Bone Exposed: No Treatment Notes Wound #1 (Abdomen - midline) Cleanser Normal Saline Discharge Instruction: Cleanse the wound with Normal Saline prior to applying a clean dressing using gauze sponges, not tissue or cotton balls. Peri-Wound Care Topical Primary Dressing Hydrofera Blue Ready Foam, 2.5 x2.5 in Discharge Instruction: Apply to wound bed as instructed Secondary Dressing Woven Gauze Sponge, Non-Sterile 4x4 in Discharge Instruction: Apply over primary dressing as directed. Woven Gauze Sponges 2x2 in Discharge Instruction: Apply over primary dressing folded to fill crease from incision Secured With 37M Medipore H Soft Cloth Surgical T 4 x 2 (in/yd) ape Discharge Instruction: Secure dressing with tape as directed. Compression Wrap Compression Stockings Add-Ons Electronic Signature(s) Signed: 10/28/2020 5:34:22 PM By: Lorrin Jackson Signed: 10/28/2020 5:46:28 PM By: Baruch Gouty RN, BSN Entered By: Lorrin Jackson on 10/28/2020 09:40:59 -------------------------------------------------------------------------------- Wound Assessment Details Patient Name: Date of Service: Roger Brennan HN 10/28/2020 9:00 A M Medical Record Number: [id number] Patient Account Number: [id number] Date of Birth/Sex: Treating RN: 10-13-1960 (60 y.o. [name] Primary Care Kagan Mutchler: [name] Other Clinician: Referring Britton Bera: Treating  Login Muckleroy/Extender: [name] in Treatment: 0 Wound Status Wound Number: 2 Primary Etiology: Open Surgical Wound Wound Location: Left Abdomen - Lower Quadrant Wound Status: Open Wounding Event: Surgical Injury Comorbid  History: Deep Vein Thrombosis, Gout Date Acquired: 08/15/2020 Weeks Of Treatment: 0 Clustered Wound: No Photos Wound Measurements Length: (cm) 1 Width: (cm) 1.2 Depth: (cm) 0.1 Area: (cm) 0.942 Volume: (cm) 0.094 % Reduction in Area: 0% % Reduction in Volume: 0% Epithelialization: Small (1-33%) Tunneling: No Undermining: No Wound Description Classification: Full Thickness Without Exposed Support Structures Wound Margin: Flat and Intact Exudate Amount: Medium Exudate Type: Serosanguineous Exudate Color: red, brown Foul Odor After Cleansing: No Slough/Fibrino No Wound Bed Granulation Amount: Large (67-100%) Exposed Structure Granulation Quality: Red, Friable Fascia Exposed: No Necrotic Amount: None Present (0%) Fat Layer (Subcutaneous Tissue) Exposed: Yes Tendon Exposed: No Muscle Exposed: No Joint Exposed: No Bone Exposed: No Treatment Notes Wound #2 (Abdomen - Lower Quadrant) Wound Laterality: Left Cleanser Normal Saline Discharge Instruction: Cleanse the wound with Normal Saline prior to applying a clean dressing using gauze sponges, not tissue or cotton balls. Peri-Wound Care Topical Primary Dressing Hydrofera Blue Ready Foam, 2.5 x2.5 in Discharge Instruction: Apply to wound bed as instructed Secondary Dressing Woven Gauze Sponge, Non-Sterile 4x4 in Discharge Instruction: Apply over primary dressing as directed. Woven Gauze Sponges 2x2 in Discharge Instruction: Apply over primary dressing folded to fill crease from incision Secured With 64M Medipore H Soft Cloth Surgical T 4 x 2 (in/yd) ape Discharge Instruction: Secure dressing with tape as directed. Compression Wrap Compression Stockings Add-Ons Electronic Signature(s) Signed: 10/28/2020 5:34:22 PM By: Lorrin Jackson Signed: 10/28/2020 5:46:28 PM By: Baruch Gouty RN, BSN Entered By: Lorrin Jackson on 10/28/2020  09:42:09 -------------------------------------------------------------------------------- Vitals Details Patient Name: Date of Service: Roger Brennan HN 10/28/2020 9:00 A [facility] Medical Record Number: [id number] Patient Account Number: [id number] Date of Birth/Sex: Treating RN: 1960-03-23 (60 y.o. [name] Primary Care Vi Whitesel: [name] Other Clinician: Referring Adalynne Steffensmeier: Treating Kaleel Schmieder/Extender: [name] in Treatment: 0 Vital Signs Time Taken: 09:12 Temperature (F): 97.5 Height (in): 70 Pulse (bpm): 78 Source: Stated Respiratory Rate (breaths/min): 18 Weight (lbs): 162 Blood Pressure (mmHg): 145/95 Source: Stated Reference Range: 80 - 120 mg / dl Body Mass Index (BMI): 23.2 Electronic Signature(s) Signed: 10/28/2020 5:46:28 PM By: Baruch Gouty RN, BSN Entered By: Baruch Gouty on 10/28/2020 09:13:18

## 2020-10-30 ENCOUNTER — Other Ambulatory Visit (HOSPITAL_COMMUNITY): Payer: Self-pay | Admitting: Nurse Practitioner

## 2020-10-30 DIAGNOSIS — K9400 Colostomy complication, unspecified: Secondary | ICD-10-CM

## 2020-10-30 NOTE — Progress Notes (Signed)
Roger, Brennan ([id number]) Visit Report for 10/28/2020 Chief Complaint Document Details Patient Name: Date of Service: Roger Brennan [facility] 10/28/2020 9:00 [place] Record Number: [id number] Patient Account Number: [id number] Date of Birth/Sex: Treating RN: 1960-07-14 (60 y.o. [name] Primary Care Provider: [name] Other Clinician: Referring Provider: Treating Provider/Extender: [name] in Treatment: 0 Information Obtained from: Patient Chief Complaint Dehisced surgical abdominal ulcers Electronic Signature(s) Signed: 10/28/2020 10:07:40 AM By: [name] PA-C Entered By: [name] on 10/28/2020 10:07:39 -------------------------------------------------------------------------------- Debridement Details Patient Name: Date of Service: Roger Brennan 10/28/2020 9:00 [place] Record Number: [id number] Patient Account Number: [id number] Date of Birth/Sex: Treating RN: 1960-02-18 (60 y.o. [name] Primary Care Provider: [name] Other Clinician: Referring Provider: Treating Provider/Extender: [name] in Treatment: 0 Debridement Performed for Assessment: Wound #1 Abdomen - midline Performed By: Clinician Baruch Gouty, RN Debridement Type: Chemical/Enzymatic/Mechanical Agent Used: gauze and wound cleanser Level of Consciousness (Pre-procedure): Awake and Alert Pre-procedure Verification/Time Out No Taken: Bleeding: Minimum Hemostasis Achieved: Pressure Response to Treatment: Procedure was tolerated well Level of Consciousness (Post- Awake and Alert procedure): Post Debridement Measurements of Total Wound Length: (cm) 1.9 Width: (cm) 0.3 Depth: (cm) 0.1 Volume: (cm) 0.045 Character of Wound/Ulcer Post Debridement: Improved Post Procedure Diagnosis Same as Pre-procedure Electronic Signature(s) Signed: 10/28/2020 5:46:28 PM By: Baruch Gouty RN, BSN Signed:  10/30/2020 4:42:31 PM By: [name] PA-C Entered By: Baruch Gouty on 10/28/2020 10:19:56 -------------------------------------------------------------------------------- Debridement Details Patient Name: Date of Service: Roger Brennan 10/28/2020 9:00 A M Medical Record Number: [id number] Patient Account Number: [id number] Date of Birth/Sex: Treating RN: Jul 17, 1960 (60 y.o. [name] Primary Care Provider: [name] Other Clinician: Referring Provider: Treating Provider/Extender: [name] in Treatment: 0 Debridement Performed for Assessment: Wound #2 Left Abdomen - Lower Quadrant Performed By: Clinician Baruch Gouty, RN Debridement Type: Chemical/Enzymatic/Mechanical Agent Used: gauze and wound cleanser Level of Consciousness (Pre-procedure): Awake and Alert Pre-procedure Verification/Time Out No Taken: Bleeding: Minimum Hemostasis Achieved: Pressure Response to Treatment: Procedure was tolerated well Level of Consciousness (Post- Awake and Alert procedure): Post Debridement Measurements of Total Wound Length: (cm) 1 Width: (cm) 1.2 Depth: (cm) 0.1 Volume: (cm) 0.094 Character of Wound/Ulcer Post Debridement: Improved Post Procedure Diagnosis Same as Pre-procedure Electronic Signature(s) Signed: 10/28/2020 5:46:28 PM By: Baruch Gouty RN, BSN Signed: 10/30/2020 4:42:31 PM By: [name] PA-C Entered By: Baruch Gouty on 10/28/2020 10:20:16 -------------------------------------------------------------------------------- HPI Details Patient Name: Date of Service: Roger Brennan 10/28/2020 9:00 A M Medical Record Number: [id number] Patient Account Number: [id number] Date of Birth/Sex: Treating RN: 1960/05/02 (60 y.o. [name] Primary Care Provider: [name] Other Clinician: Referring Provider: Treating Provider/Extender: [name] in Treatment: 0 History of  Present Illness HPI Description: 10/28/2020 upon evaluation today patient presents for initial evaluation in the clinic concerning issues that he has unfortunately been having with his left lower quadrant abdominal region as well as the midline abdominal region. 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 Subsequently they never figured out exactly what caused this he got very ill and ended up having to have significant surgery he has a colostomy as well. With that being said these wounds have done extremely well considering how bad they were at one point although there is still a definitive opening  at this point in 2 locations that he is seeing me today with regard to. His initial surgery was on 08/12/2020 he had a repeat surgery on 08/15/2020 to go back in and clean everything out. He was in the ICU for 2 weeks. He did have a DVT while in the hospital and is on blood thinners for the next month. With that being said the good news is overall he is showing some signs of improvement and otherwise was very healthy before all this began the bad news is obviously that he still having quite a bit of an issue here with regard to his wounds. Electronic Signature(s) Signed: 10/30/2020 3:53:53 PM By: [name] PA-C Signed: 10/30/2020 3:53:53 PM By: [name] PA-C Previous Signature: 10/28/2020 4:52:03 PM Version By: [name] PA-C Previous Signature: 10/28/2020 4:50:52 PM Version By: [name] PA-C Entered By: [name] on 10/30/2020 15:53:52 -------------------------------------------------------------------------------- Physical Exam Details Patient Name: Date of Service: Roger Brennan 10/28/2020 9:00 [place] Record Number: [id number] Patient Account Number: [id number] Date of Birth/Sex: Treating RN: March 02, 1960 (60 y.o. [name] Primary Care Provider: [name] Other  Clinician: Referring Provider: Treating Provider/Extender: [name] in Treatment: 0 Constitutional patient is hypertensive.. Eyes conjunctiva clear no eyelid edema noted. pupils equal round and reactive to light and accommodation. Ears, Nose, Mouth, and Throat no gross abnormality of ear auricles or external auditory canals. normal hearing noted during conversation. mucus membranes moist. Respiratory normal breathing without difficulty. Musculoskeletal normal gait and posture. no significant deformity or arthritic changes, no loss or range of motion, no clubbing. Psychiatric this patient is able to make decisions and demonstrates good insight into disease process. Alert and Oriented x 3. pleasant and cooperative. Notes Upon inspection patient's wound bed actually showed signs of good granulation and epithelization at this point. Fortunately there does not appear to be any evidence of active infection there is some hypergranulation due to I think a lot of the excess moisture at this point. Nonetheless I do believe that he would benefit from [facility] as a dressing of choice. Electronic Signature(s) Signed: 10/30/2020 3:54:19 PM By: [name] PA-C Entered By: [name] on 10/30/2020 15:54:19 -------------------------------------------------------------------------------- Physician Orders Details Patient Name: Date of Service: Roger Brennan 10/28/2020 9:00 [place] Record Number: [id number] Patient Account Number: [id number] Date of Birth/Sex: Treating RN: 02-02-1960 (60 y.o. [name] Primary Care Provider: [name] Other Clinician: Referring Provider: Treating Provider/Extender: [name] in Treatment: 0 Verbal / Phone Orders: No Diagnosis Coding ICD-10 Coding Code Description T81.31XA Disruption of external operation (surgical) wound, not elsewhere classified, initial  encounter L98.492 Non-pressure chronic ulcer of skin of other sites with fat layer exposed Z79.01 Long term (current) use of anticoagulants Z86.718 Personal history of other venous thrombosis and embolism Follow-up Appointments ppointment in 1 week. - with Margarita Grizzle Return A Bathing/ Shower/ Hygiene May shower and wash wound with soap and water. Home Health New wound care orders this week; continue Home Health for wound care. May utilize formulary equivalent dressing for wound treatment orders unless otherwise specified. Dressing changes to be completed by [place] on Monday / Wednesday / Friday except when patient has scheduled visit at [facility] Other Home Health Orders/Instructions: - Advanced home care Wound Treatment Wound #1 - Abdomen - midline Cleanser: Normal Saline (DME) (Generic) 3 x Per Week/30 Days Discharge Instructions: Cleanse the wound with  Normal Saline prior to applying a clean dressing using gauze sponges, not tissue or cotton balls. Prim Dressing: Hydrofera Blue Ready Foam, 2.5 x2.5 in (DME) (Generic) 3 x Per Week/30 Days ary Discharge Instructions: Apply to wound bed as instructed Secondary Dressing: Woven Gauze Sponge, Non-Sterile 4x4 in (DME) (Generic) 3 x Per Week/30 Days Discharge Instructions: Apply over primary dressing as directed. Secondary Dressing: Woven Gauze Sponges 2x2 in (DME) (Generic) 3 x Per Week/30 Days Discharge Instructions: Apply over primary dressing folded to fill crease from incision Secured With: 55M Medipore H Soft Cloth Surgical T 4 x 2 (in/yd) (DME) (Generic) 3 x Per Week/30 Days ape Discharge Instructions: Secure dressing with tape as directed. Wound #2 - Abdomen - Lower Quadrant Wound Laterality: Left Cleanser: Normal Saline (DME) (Generic) 3 x Per Week/30 Days Discharge Instructions: Cleanse the wound with Normal Saline prior to applying a clean dressing using gauze sponges, not tissue or cotton balls. Prim Dressing: Hydrofera  Blue Ready Foam, 2.5 x2.5 in (DME) (Generic) 3 x Per Week/30 Days ary Discharge Instructions: Apply to wound bed as instructed Secondary Dressing: Woven Gauze Sponge, Non-Sterile 4x4 in (DME) (Generic) 3 x Per Week/30 Days Discharge Instructions: Apply over primary dressing as directed. Secondary Dressing: Woven Gauze Sponges 2x2 in (DME) (Generic) 3 x Per Week/30 Days Discharge Instructions: Apply over primary dressing folded to fill crease from incision Secured With: 55M Medipore H Soft Cloth Surgical T 4 x 2 (in/yd) (DME) (Generic) 3 x Per Week/30 Days ape Discharge Instructions: Secure dressing with tape as directed. Electronic Signature(s) Signed: 10/28/2020 5:46:28 PM By: Baruch Gouty RN, BSN Signed: 10/30/2020 4:42:31 PM By: [name] PA-C Entered By: Baruch Gouty on 10/28/2020 10:17:20 -------------------------------------------------------------------------------- Problem List Details Patient Name: Date of Service: Roger Brennan 10/28/2020 9:00 A M Medical Record Number: [id number] Patient Account Number: [id number] Date of Birth/Sex: Treating RN: [birth year] (60 y.o. [name] Primary Care Provider: [name] Other Clinician: Referring Provider: Treating Provider/Extender: [name] in Treatment: 0 Active Problems ICD-10 Encounter Code Description Active Date MDM Diagnosis T81.31XA Disruption of external operation (surgical) wound, not elsewhere classified, 10/28/2020 No Yes initial encounter L98.492 Non-pressure chronic ulcer of skin of other sites with fat layer exposed 10/28/2020 No Yes Z79.01 Long term (current) use of anticoagulants 10/28/2020 No Yes Z86.718 Personal history of other venous thrombosis and embolism 10/28/2020 No Yes Inactive Problems Resolved Problems Electronic Signature(s) Signed: 10/28/2020 10:07:11 AM By: [name] PA-C Entered By: [name] on 10/28/2020  10:07:11 -------------------------------------------------------------------------------- Progress Note Details Patient Name: Date of Service: Roger Brennan 10/28/2020 9:00 [place] Record Number: [id number] Patient Account Number: [id number] Date of Birth/Sex: Treating RN: 1960/04/04 (60 y.o. [name] Primary Care Provider: [name] Other Clinician: Referring Provider: Treating Provider/Extender: [name] in Treatment: 0 Subjective Chief Complaint Information obtained from Patient Dehisced surgical abdominal ulcers History of Present Illness (HPI) 10/28/2020 upon evaluation today patient presents for initial evaluation in the clinic concerning issues that he has unfortunately been having with his left lower quadrant abdominal region as well as the midline abdominal region. 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 Subsequently they never figured out exactly what caused this he got very ill and ended up having to have significant surgery he has a colostomy as well. With that being said these wounds have done extremely well considering  how bad they were at one point although there is still a definitive opening at this point in 2 locations that he is seeing me today with regard to. His initial surgery was on 08/12/2020 he had a repeat surgery on 08/15/2020 to go back in and clean everything out. He was in the ICU for 2 weeks. He did have a DVT while in the hospital and is on blood thinners for the next month. With that being said the good news is overall he is showing some signs of improvement and otherwise was very healthy before all this began the bad news is obviously that he still having quite a bit of an issue here with regard to his wounds. Patient History Information obtained from Patient. Allergies No Known Allergies Family History Unknown  History. Social History Never smoker, Marital Status - Married, Alcohol Use - Rarely, Drug Use - No History, Caffeine Use - Daily. Medical History Cardiovascular Patient has history of Deep Vein Thrombosis Endocrine Denies history of Type I Diabetes, Type II Diabetes Genitourinary Denies history of End Stage Renal Disease Integumentary (Skin) Denies history of History of Burn Musculoskeletal Patient has history of Gout Denies history of Rheumatoid Arthritis, Osteoarthritis, Osteomyelitis Oncologic Denies history of Received Chemotherapy, Received Radiation Psychiatric Denies history of Anorexia/bulimia, Confinement Anxiety Hospitalization/Surgery History - exploratory lap with colostomy. - left knee meniscus repair. Medical A Surgical History Notes nd Gastrointestinal perforated diverticula Review of Systems (ROS) Constitutional Symptoms (General Health) Denies complaints or symptoms of Fatigue, Fever, Chills, Marked Weight Change. Eyes Denies complaints or symptoms of Dry Eyes, Vision Changes, Glasses / Contacts. Ear/Nose/Mouth/Throat Denies complaints or symptoms of Chronic sinus problems or rhinitis. Respiratory Denies complaints or symptoms of Chronic or frequent coughs, Shortness of Breath. Cardiovascular Denies complaints or symptoms of Chest pain. Gastrointestinal Denies complaints or symptoms of Frequent diarrhea, Nausea, Vomiting. Endocrine Denies complaints or symptoms of Heat/cold intolerance. Genitourinary Denies complaints or symptoms of Frequent urination. Integumentary (Skin) Complains or has symptoms of Wounds - abdomen. Musculoskeletal Denies complaints or symptoms of Muscle Pain, Muscle Weakness. Neurologic Denies complaints or symptoms of Numbness/parasthesias. Psychiatric Denies complaints or symptoms of Claustrophobia, Suicidal. Objective Constitutional patient is hypertensive.. Vitals Time Taken: 9:12 AM, Height: 70 in, Source: Stated, Weight:  162 lbs, Source: Stated, BMI: 23.2, Temperature: 97.5 F, Pulse: 78 bpm, Respiratory Rate: 18 breaths/min, Blood Pressure: 145/95 mmHg. Eyes conjunctiva clear no eyelid edema noted. pupils equal round and reactive to light and accommodation. Ears, Nose, Mouth, and Throat no gross abnormality of ear auricles or external auditory canals. normal hearing noted during conversation. mucus membranes moist. Respiratory normal breathing without difficulty. Musculoskeletal normal gait and posture. no significant deformity or arthritic changes, no loss or range of motion, no clubbing. Psychiatric this patient is able to make decisions and demonstrates good insight into disease process. Alert and Oriented x 3. pleasant and cooperative. General Notes: Upon inspection patient's wound bed actually showed signs of good granulation and epithelization at this point. Fortunately there does not appear to be any evidence of active infection there is some hypergranulation due to I think a lot of the excess moisture at this point. Nonetheless I do believe that he would benefit from [facility] as a dressing of choice. Integumentary (Hair, Skin) Wound #1 status is Open. Original cause of wound was Surgical Injury. The date acquired was: 08/15/2020. The wound is located on the Abdomen - midline. The wound measures 1.9cm length x 0.3cm width x 0.1cm depth; 0.448cm^2 area and 0.045cm^3 volume. There  is Fat Layer (Subcutaneous Tissue) exposed. There is no tunneling or undermining noted. There is a small amount of serosanguineous drainage noted. The wound margin is flat and intact. There is large (67- 100%) red, hyper - granulation within the wound bed. There is no necrotic tissue within the wound bed. Wound #2 status is Open. Original cause of wound was Surgical Injury. The date acquired was: 08/15/2020. The wound is located on the Left Abdomen - Lower Quadrant. The wound measures 1cm length x 1.2cm width x 0.1cm depth;  0.942cm^2 area and 0.094cm^3 volume. There is Fat Layer (Subcutaneous Tissue) exposed. There is no tunneling or undermining noted. There is a medium amount of serosanguineous drainage noted. The wound margin is flat and intact. There is large (67-100%) red, friable granulation within the wound bed. There is no necrotic tissue within the wound bed. Assessment Active Problems ICD-10 Disruption of external operation (surgical) wound, not elsewhere classified, initial encounter Non-pressure chronic ulcer of skin of other sites with fat layer exposed Long term (current) use of anticoagulants Personal history of other venous thrombosis and embolism Procedures Wound #1 Pre-procedure diagnosis of Wound #1 is an Open Surgical Wound located on the Abdomen - midline . There was a Chemical/Enzymatic/Mechanical debridement performed by Baruch Gouty, RN.. Other agent used was gauze and wound cleanser. A Minimum amount of bleeding was controlled with Pressure. The procedure was tolerated well. Post Debridement Measurements: 1.9cm length x 0.3cm width x 0.1cm depth; 0.045cm^3 volume. Character of Wound/Ulcer Post Debridement is improved. Post procedure Diagnosis Wound #1: Same as Pre-Procedure Wound #2 Pre-procedure diagnosis of Wound #2 is an Open Surgical Wound located on the Left Abdomen - Lower Quadrant . There was a Chemical/Enzymatic/Mechanical debridement performed by Baruch Gouty, RN.. Other agent used was gauze and wound cleanser. A Minimum amount of bleeding was controlled with Pressure. The procedure was tolerated well. Post Debridement Measurements: 1cm length x 1.2cm width x 0.1cm depth; 0.094cm^3 volume. Character of Wound/Ulcer Post Debridement is improved. Post procedure Diagnosis Wound #2: Same as Pre-Procedure Plan Follow-up Appointments: Return Appointment in 1 week. - with Glynn Octave Shower/ Hygiene: May shower and wash wound with soap and water. Home Health: New wound care  orders this week; continue Home Health for wound care. May utilize formulary equivalent dressing for wound treatment orders unless otherwise specified. Dressing changes to be completed by [place] on Monday / Wednesday / Friday except when patient has scheduled visit at [facility]. Other Home Health Orders/Instructions: - Advanced home care WOUND #1: - Abdomen - midline Wound Laterality: Cleanser: Normal Saline (DME) (Generic) 3 x Per Week/30 Days Discharge Instructions: Cleanse the wound with Normal Saline prior to applying a clean dressing using gauze sponges, not tissue or cotton balls. Prim Dressing: Hydrofera Blue Ready Foam, 2.5 x2.5 in (DME) (Generic) 3 x Per Week/30 Days ary Discharge Instructions: Apply to wound bed as instructed Secondary Dressing: Woven Gauze Sponge, Non-Sterile 4x4 in (DME) (Generic) 3 x Per Week/30 Days Discharge Instructions: Apply over primary dressing as directed. Secondary Dressing: Woven Gauze Sponges 2x2 in (DME) (Generic) 3 x Per Week/30 Days Discharge Instructions: Apply over primary dressing folded to fill crease from incision Secured With: 66M Medipore H Soft Cloth Surgical T 4 x 2 (in/yd) (DME) (Generic) 3 x Per Week/30 Days ape Discharge Instructions: Secure dressing with tape as directed. WOUND #2: - Abdomen - Lower Quadrant Wound Laterality: Left Cleanser: Normal Saline (DME) (Generic) 3 x Per Week/30 Days Discharge Instructions: Cleanse the wound with Normal  Saline prior to applying a clean dressing using gauze sponges, not tissue or cotton balls. Prim Dressing: Hydrofera Blue Ready Foam, 2.5 x2.5 in (DME) (Generic) 3 x Per Week/30 Days ary Discharge Instructions: Apply to wound bed as instructed Secondary Dressing: Woven Gauze Sponge, Non-Sterile 4x4 in (DME) (Generic) 3 x Per Week/30 Days Discharge Instructions: Apply over primary dressing as directed. Secondary Dressing: Woven Gauze Sponges 2x2 in (DME) (Generic) 3 x Per Week/30  Days Discharge Instructions: Apply over primary dressing folded to fill crease from incision Secured With: 57M Medipore H Soft Cloth Surgical T 4 x 2 (in/yd) (DME) (Generic) 3 x Per Week/30 Days ape Discharge Instructions: Secure dressing with tape as directed. 1. Based on what I am seeing currently I Minna recommend that we initiate treatment with [facility] I think this is can be one of the best options and the patient is in agreement with the plan. 2. 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 3. He will cover this with a tape that will not irritate his skin to secure in place I think this is definitely a good option for him and hopefully this will help get the wounds to completely close fairly quickly. I think there is a very solid chance of this working. We will see patient back for reevaluation in 1 week here in the clinic. If anything worsens or changes patient will contact our office for additional recommendations. Electronic Signature(s) Signed: 10/30/2020 3:57:20 PM By: [name] PA-C Entered By: [name] on 10/30/2020 15:57:19 -------------------------------------------------------------------------------- HxROS Details Patient Name: Date of Service: Roger Brennan 10/28/2020 9:00 [place] Record Number: [id number] Patient Account Number: [id number] Date of Birth/Sex: Treating RN: 1960-10-12 (60 y.o. [name] Primary Care Provider: [name] Other Clinician: Referring Provider: Treating Provider/Extender: [name] in Treatment: 0 Information Obtained From Patient Constitutional Symptoms (General Health) Complaints and Symptoms: Negative for: Fatigue; Fever; Chills; Marked Weight Change Eyes Complaints and Symptoms: Negative for: Dry Eyes; Vision Changes; Glasses /  Contacts Ear/Nose/Mouth/Throat Complaints and Symptoms: Negative for: Chronic sinus problems or rhinitis Respiratory Complaints and Symptoms: Negative for: Chronic or frequent coughs; Shortness of Breath Cardiovascular Complaints and Symptoms: Negative for: Chest pain Medical History: Positive for: Deep Vein Thrombosis Gastrointestinal Complaints and Symptoms: Negative for: Frequent diarrhea; Nausea; Vomiting Medical History: Past Medical History Notes: perforated diverticula Endocrine Complaints and Symptoms: Negative for: Heat/cold intolerance Medical History: Negative for: Type I Diabetes; Type II Diabetes Genitourinary Complaints and Symptoms: Negative for: Frequent urination Medical History: Negative for: End Stage Renal Disease Integumentary (Skin) Complaints and Symptoms: Positive for: Wounds - abdomen Medical History: Negative for: History of Burn Musculoskeletal Complaints and Symptoms: Negative for: Muscle Pain; Muscle Weakness Medical History: Positive for: Gout Negative for: Rheumatoid Arthritis; Osteoarthritis; Osteomyelitis Neurologic Complaints and Symptoms: Negative for: Numbness/parasthesias Psychiatric Complaints and Symptoms: Negative for: Claustrophobia; Suicidal Medical History: Negative for: Anorexia/bulimia; Confinement Anxiety Hematologic/Lymphatic Immunological Oncologic Medical History: Negative for: Received Chemotherapy; Received Radiation Immunizations Pneumococcal Vaccine: Received Pneumococcal Vaccination: No Implantable Devices None Hospitalization / Surgery History Type of Hospitalization/Surgery exploratory lap with colostomy left knee meniscus repair Family and Social History Unknown History: Yes; Never smoker; Marital Status - Married; Alcohol Use: Rarely; Drug Use: No History; Caffeine Use: Daily; Financial Concerns: No; Food, Clothing or Shelter Needs: No; Support System Lacking: No; Transportation Concerns:  No  Electronic Signature(s) Signed: 10/28/2020 5:46:28 PM By: Baruch Gouty RN, BSN Signed: 10/30/2020 4:42:31 PM By: [name] PA-C Entered By: Baruch Gouty on 10/28/2020 09:23:26 -------------------------------------------------------------------------------- SuperBill Details Patient Name: Date of Service: Roger Brennan 10/28/2020 Medical Record Number: [id number] Patient Account Number: [id number] Date of Birth/Sex: Treating RN: December 25, 1960 (60 y.o. [name] Primary Care Provider: [name] Other Clinician: Referring Provider: Treating Provider/Extender: [name] in Treatment: 0 Diagnosis Coding ICD-10 Codes Code Description T81.31XA Disruption of external operation (surgical) wound, not elsewhere classified, initial encounter L98.492 Non-pressure chronic ulcer of skin of other sites with fat layer exposed Z79.01 Long term (current) use of anticoagulants Z86.718 Personal history of other venous thrombosis and embolism Facility Procedures CPT4 Code: 00459977 Description: 99214 - WOUND CARE VISIT-LEV 4 EST PT Modifier: Quantity: 1 Physician Procedures : CPT4 Code Description Modifier [id number] - WC PHYS LEVEL 4 - NEW PT ICD-10 Diagnosis Description T81.31XA Disruption of external operation (surgical) wound, not elsewhere classified, initial encounter L98.492 Non-pressure chronic ulcer of skin of  other sites with fat layer exposed Z79.01 Long term (current) use of anticoagulants Z86.718 Personal history of other venous thrombosis and embolism Quantity: 1 Electronic Signature(s) Signed: 10/30/2020 3:57:56 PM By: [name] PA-C Previous Signature: 10/28/2020 5:46:28 PM Version By: Baruch Gouty RN, BSN Entered By: [name] on 10/30/2020 15:57:56

## 2020-11-04 ENCOUNTER — Other Ambulatory Visit: Payer: Self-pay

## 2020-11-04 ENCOUNTER — Encounter (HOSPITAL_BASED_OUTPATIENT_CLINIC_OR_DEPARTMENT_OTHER): Payer: Managed Care, Other (non HMO) | Attending: Physician Assistant | Admitting: Physician Assistant

## 2020-11-04 DIAGNOSIS — Z933 Colostomy status: Secondary | ICD-10-CM | POA: Diagnosis not present

## 2020-11-04 DIAGNOSIS — Z86718 Personal history of other venous thrombosis and embolism: Secondary | ICD-10-CM | POA: Diagnosis not present

## 2020-11-04 DIAGNOSIS — Z7901 Long term (current) use of anticoagulants: Secondary | ICD-10-CM | POA: Insufficient documentation

## 2020-11-04 DIAGNOSIS — L98492 Non-pressure chronic ulcer of skin of other sites with fat layer exposed: Secondary | ICD-10-CM | POA: Insufficient documentation

## 2020-11-04 NOTE — Progress Notes (Addendum)
CLIFTON, SAFLEY ([id number]) Visit Report for 11/04/2020 Chief Complaint Document Details Patient Name: Date of Service: Roger Brennan [facility] 11/04/2020 1:15 PM Medical Record Number: [id number] Patient Account Number: [id number] Date of Birth/Sex: Treating RN: 10-20-1960 (60 y.o. Roger Brennan Primary Care Provider: [name] Other Clinician: Referring Provider: Treating Provider/Extender: [name] in Treatment: 1 Information Obtained from: Patient Chief Complaint Dehisced surgical abdominal ulcers Electronic Signature(s) Signed: 11/04/2020 1:25:58 PM By: [name] PA-C Entered By: [name] on 11/04/2020 13:25:58 -------------------------------------------------------------------------------- HPI Details Patient Name: Date of Service: Roger Brennan [facility] 11/04/2020 1:15 PM Medical Record Number: [id number] Patient Account Number: [id number] Date of Birth/Sex: Treating RN: [birth year] (60 y.o. Roger Brennan Primary Care Provider: [name] Other Clinician: Referring Provider: Treating Provider/Extender: [name] in Treatment: 1 History of Present Illness HPI Description: 10/28/2020 upon evaluation today patient presents for initial evaluation in the clinic concerning issues that he has unfortunately been having with his left lower quadrant abdominal region as well as the midline abdominal region. 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 Subsequently they never figured out exactly what caused this he got very ill and ended up having to have significant surgery he has a colostomy as well. With that being said these wounds have done extremely well considering how bad they were at one point although there is still a definitive opening at this point in 2 locations that he is seeing me today with regard to. His initial  surgery was on 08/12/2020 he had a repeat surgery on 08/15/2020 to go back in and clean everything out. He was in the ICU for 2 weeks. He did have a DVT while in the hospital and is on blood thinners for the next month. With that being said the good news is overall he is showing some signs of improvement and otherwise was very healthy before all this began the bad news is obviously that he still having quite a bit of an issue here with regard to his wounds. 11/04/2020 upon evaluation today patient appears to be doing well with regard to his wounds. In fact both are showing signs of excellent improvement which is great news. There does not appear to be any evidence of active infection which is also great news and overall I am extremely pleased with where we stand today. Electronic Signature(s) Signed: 11/04/2020 2:00:11 PM By: [name] PA-C Entered By: [name] on 11/04/2020 14:00:11 -------------------------------------------------------------------------------- Physical Exam Details Patient Name: Date of Service: Roger Brennan [facility] 11/04/2020 1:15 PM Medical Record Number: [id number] Patient Account Number: [id number] Date of Birth/Sex: Treating RN: January 13, 1961 (60 y.o. Roger Brennan Primary Care Provider: [name] Other Clinician: Referring Provider: Treating Provider/Extender: [name] in Treatment: 1 Constitutional Well-nourished and well-hydrated in no acute distress. Respiratory normal breathing without difficulty. Psychiatric this patient is able to make decisions and demonstrates good insight into disease process. Alert and Oriented x 3. pleasant and cooperative. Notes Upon inspection patient's wound bed showed signs of good granulation epithelization at this point. I do believe that the hypergranulation is greatly improved and overall I think the patient is making excellent progress. I do not see any evidence of active infection at  this time which is great. There is a little bit on the lateral wound evidence of some tinea corporis noted here. I do  think he could be benefited by using some miconazole topically over this area. Electronic Signature(s) Signed: 11/04/2020 2:00:40 PM By: [name] PA-C Entered By: [name] on 11/04/2020 14:00:40 -------------------------------------------------------------------------------- Physician Orders Details Patient Name: Date of Service: Roger Brennan [facility] 11/04/2020 1:15 PM Medical Record Number: [id number] Patient Account Number: [id number] Date of Birth/Sex: Treating RN: [birth year] (60 y.o. [name] Primary Care Provider: [name] Other Clinician: Referring Provider: Treating Provider/Extender: [name] in Treatment: 1 Verbal / Phone Orders: No Diagnosis Coding ICD-10 Coding Code Description T81.31XA Disruption of external operation (surgical) wound, not elsewhere classified, initial encounter L98.492 Non-pressure chronic ulcer of skin of other sites with fat layer exposed Z79.01 Long term (current) use of anticoagulants Z86.718 Personal history of other venous thrombosis and embolism Follow-up Appointments ppointment in 2 weeks. Margarita Grizzle Return A ***Pick up over the counter at pharmacy- antifungal powder-micronazole nitrate 2%*** When you see the surgeon take your dressings to redress at their office. Bathing/ Shower/ Hygiene May shower and wash wound with soap and water. Home Health No change in wound care orders this week; continue Home Health for wound care. May utilize formulary equivalent dressing for wound treatment orders unless otherwise specified. Dressing changes to be completed by [place] on Monday / Wednesday / Friday except when patient has scheduled visit at [facility]. Other Home Health Orders/Instructions: - Advanced home care Wound Treatment Wound #1 - Abdomen - midline Cleanser: Normal  Saline (Generic) 3 x Per Week/30 Days Discharge Instructions: Cleanse the wound with Normal Saline prior to applying a clean dressing using gauze sponges, not tissue or cotton balls. Prim Dressing: Hydrofera Blue Ready Foam, 2.5 x2.5 in (Generic) 3 x Per Week/30 Days ary Discharge Instructions: Apply to wound bed as instructed Secondary Dressing: Woven Gauze Sponge, Non-Sterile 4x4 in (Generic) 3 x Per Week/30 Days Discharge Instructions: Apply over primary dressing as directed. Secondary Dressing: Woven Gauze Sponges 2x2 in (Generic) 3 x Per Week/30 Days Discharge Instructions: Apply over primary dressing folded to fill crease from incision Secured With: 34M Medipore H Soft Cloth Surgical T 4 x 2 (in/yd) (Generic) 3 x Per Week/30 Days ape Discharge Instructions: Secure dressing with tape as directed. Wound #2 - Abdomen - Lower Quadrant Wound Laterality: Left Cleanser: Normal Saline (Generic) 3 x Per Week/30 Days Discharge Instructions: Cleanse the wound with Normal Saline prior to applying a clean dressing using gauze sponges, not tissue or cotton balls. Peri-Wound Care: antifungal powder-micronazole nitrate 2% 3 x Per Week/30 Days Discharge Instructions: Apply the powder to the area with dressing changes. Prim Dressing: Hydrofera Blue Ready Foam, 2.5 x2.5 in (Generic) 3 x Per Week/30 Days ary Discharge Instructions: Apply to wound bed as instructed Secondary Dressing: Woven Gauze Sponge, Non-Sterile 4x4 in (Generic) 3 x Per Week/30 Days Discharge Instructions: Apply over primary dressing as directed. Secondary Dressing: Woven Gauze Sponges 2x2 in (Generic) 3 x Per Week/30 Days Discharge Instructions: Apply over primary dressing folded to fill crease from incision Secured With: 34M Medipore H Soft Cloth Surgical T 4 x 2 (in/yd) (Generic) 3 x Per Week/30 Days ape Discharge Instructions: Secure dressing with tape as directed. Electronic Signature(s) Signed: 11/05/2020 6:25:33 PM By: Deon Pilling RN, BSN Signed: 11/06/2020 9:30:20 AM By: [name] PA-C Entered By: Deon Pilling on 11/04/2020 13:58:09 -------------------------------------------------------------------------------- Problem List Details Patient Name: Date of Service: Roger Brennan [facility] 11/04/2020 1:15 PM Medical Record Number: [id number] Patient Account Number: [id number] Date of  Birth/Sex: Treating RN: 1960-04-02 (60 y.o. Roger Brennan Primary Care Provider: [name] Other Clinician: Referring Provider: Treating Provider/Extender: [name] in Treatment: 1 Active Problems ICD-10 Encounter Code Description Active Date MDM Diagnosis T81.31XA Disruption of external operation (surgical) wound, not elsewhere classified, 10/28/2020 No Yes initial encounter L98.492 Non-pressure chronic ulcer of skin of other sites with fat layer exposed 10/28/2020 No Yes Z79.01 Long term (current) use of anticoagulants 10/28/2020 No Yes Z86.718 Personal history of other venous thrombosis and embolism 10/28/2020 No Yes Inactive Problems Resolved Problems Electronic Signature(s) Signed: 11/04/2020 1:25:52 PM By: [name] PA-C Entered By: [name] on 11/04/2020 13:25:52 -------------------------------------------------------------------------------- Progress Note Details Patient Name: Date of Service: Roger Brennan [facility] 11/04/2020 1:15 PM Medical Record Number: [id number] Patient Account Number: [id number] Date of Birth/Sex: Treating RN: 1960-02-15 (60 y.o. Roger Brennan Primary Care Provider: [name] Other Clinician: Referring Provider: Treating Provider/Extender: [name] in Treatment: 1 Subjective Chief Complaint Information obtained from Patient Dehisced surgical abdominal ulcers History of Present Illness (HPI) 10/28/2020 upon evaluation today patient presents for initial evaluation in the clinic concerning issues that he  has unfortunately been having with his left lower quadrant abdominal region as well as the midline abdominal region. 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 Subsequently they never figured out exactly what caused this he got very ill and ended up having to have significant surgery he has a colostomy as well. With that being said these wounds have done extremely well considering how bad they were at one point although there is still a definitive opening at this point in 2 locations that he is seeing me today with regard to. His initial surgery was on 08/12/2020 he had a repeat surgery on 08/15/2020 to go back in and clean everything out. He was in the ICU for 2 weeks. He did have a DVT while in the hospital and is on blood thinners for the next month. With that being said the good news is overall he is showing some signs of improvement and otherwise was very healthy before all this began the bad news is obviously that he still having quite a bit of an issue here with regard to his wounds. 11/04/2020 upon evaluation today patient appears to be doing well with regard to his wounds. In fact both are showing signs of excellent improvement which is great news. There does not appear to be any evidence of active infection which is also great news and overall I am extremely pleased with where we stand today. Objective Constitutional Well-nourished and well-hydrated in no acute distress. Vitals Time Taken: 1:25 PM, Height: 70 in, Weight: 162 lbs, BMI: 23.2, Temperature: 98 F, Pulse: 88 bpm, Respiratory Rate: 20 breaths/min, Blood Pressure: 123/85 mmHg. Respiratory normal breathing without difficulty. Psychiatric this patient is able to make decisions and demonstrates good insight into disease process. Alert and Oriented x 3. pleasant and cooperative. General Notes: Upon inspection patient's wound bed showed signs of  good granulation epithelization at this point. I do believe that the hypergranulation is greatly improved and overall I think the patient is making excellent progress. I do not see any evidence of active infection at this time which is great. There is a little bit on the lateral wound evidence of some tinea corporis noted here. I do think he could be benefited by using some miconazole topically  over this area. Integumentary (Hair, Skin) Wound #1 status is Open. Original cause of wound was Surgical Injury. The date acquired was: 08/15/2020. The wound has been in treatment 1 weeks. The wound is located on the Abdomen - midline. The wound measures 0.7cm length x 0.3cm width x 0.1cm depth; 0.165cm^2 area and 0.016cm^3 volume. There is Fat Layer (Subcutaneous Tissue) exposed. There is no tunneling or undermining noted. There is a small amount of serosanguineous drainage noted. The wound margin is flat and intact. There is large (67-100%) red granulation within the wound bed. There is no necrotic tissue within the wound bed. Wound #2 status is Open. Original cause of wound was Surgical Injury. The date acquired was: 08/15/2020. The wound has been in treatment 1 weeks. The wound is located on the Left Abdomen - Lower Quadrant. The wound measures 0.4cm length x 0.5cm width x 0.1cm depth; 0.157cm^2 area and 0.016cm^3 volume. There is Fat Layer (Subcutaneous Tissue) exposed. There is no tunneling or undermining noted. There is a medium amount of serosanguineous drainage noted. The wound margin is flat and intact. There is large (67-100%) red, friable granulation within the wound bed. There is no necrotic tissue within the wound bed. Assessment Active Problems ICD-10 Disruption of external operation (surgical) wound, not elsewhere classified, initial encounter Non-pressure chronic ulcer of skin of other sites with fat layer exposed Long term (current) use of anticoagulants Personal history of other venous  thrombosis and embolism Plan Follow-up Appointments: Return Appointment in 2 weeks. Margarita Grizzle ***Pick up over the counter at pharmacy- antifungal powder-micronazole nitrate 2%*** When you see the surgeon take your dressings to redress at their office. Bathing/ Shower/ Hygiene: May shower and wash wound with soap and water. Home Health: No change in wound care orders this week; continue Home Health for wound care. May utilize formulary equivalent dressing for wound treatment orders unless otherwise specified. Dressing changes to be completed by [place] on Monday / Wednesday / Friday except when patient has scheduled visit at [facility]. Other Home Health Orders/Instructions: - Advanced home care WOUND #1: - Abdomen - midline Wound Laterality: Cleanser: Normal Saline (Generic) 3 x Per Week/30 Days Discharge Instructions: Cleanse the wound with Normal Saline prior to applying a clean dressing using gauze sponges, not tissue or cotton balls. Prim Dressing: Hydrofera Blue Ready Foam, 2.5 x2.5 in (Generic) 3 x Per Week/30 Days ary Discharge Instructions: Apply to wound bed as instructed Secondary Dressing: Woven Gauze Sponge, Non-Sterile 4x4 in (Generic) 3 x Per Week/30 Days Discharge Instructions: Apply over primary dressing as directed. Secondary Dressing: Woven Gauze Sponges 2x2 in (Generic) 3 x Per Week/30 Days Discharge Instructions: Apply over primary dressing folded to fill crease from incision Secured With: 76M Medipore H Soft Cloth Surgical T 4 x 2 (in/yd) (Generic) 3 x Per Week/30 Days ape Discharge Instructions: Secure dressing with tape as directed. WOUND #2: - Abdomen - Lower Quadrant Wound Laterality: Left Cleanser: Normal Saline (Generic) 3 x Per Week/30 Days Discharge Instructions: Cleanse the wound with Normal Saline prior to applying a clean dressing using gauze sponges, not tissue or cotton balls. Peri-Wound Care: antifungal powder-micronazole nitrate 2% 3 x Per  Week/30 Days Discharge Instructions: Apply the powder to the area with dressing changes. Prim Dressing: Hydrofera Blue Ready Foam, 2.5 x2.5 in (Generic) 3 x Per Week/30 Days ary Discharge Instructions: Apply to wound bed as instructed Secondary Dressing: Woven Gauze Sponge, Non-Sterile 4x4 in (Generic) 3 x Per Week/30 Days Discharge Instructions: Apply over primary  dressing as directed. Secondary Dressing: Woven Gauze Sponges 2x2 in (Generic) 3 x Per Week/30 Days Discharge Instructions: Apply over primary dressing folded to fill crease from incision Secured With: 65M Medipore H Soft Cloth Surgical T 4 x 2 (in/yd) (Generic) 3 x Per Week/30 Days ape Discharge Instructions: Secure dressing with tape as directed. 1. Would recommend currently that we going continue with the [employer] and bordered foam dressings to cover this seems to be doing a great job. 2. I am also can recommend a little bit of miconazole powder over the lateral wound on the left. I think this will help with the tinea corporis that we see here. 3. I am also can recommend that we have the patient utilize dressing changes every 2 days obviously if it comes off sooner he can change it sooner but I think every 2 days is ideal. We will see patient back for reevaluation in 2 weeks here in the clinic. If anything worsens or changes patient will contact our office for additional recommendations. Electronic Signature(s) Signed: 11/04/2020 2:01:26 PM By: [name] PA-C Previous Signature: 11/04/2020 2:01:12 PM Version By: [name] PA-C Entered By: [name] on 11/04/2020 14:01:26 -------------------------------------------------------------------------------- SuperBill Details Patient Name: Date of Service: Roger Brennan HN 11/04/2020 Medical Record Number: [id number] Patient Account Number: [id number] Date of Birth/Sex: Treating RN: 1960/03/24 (60 y.o. [name] Primary Care Provider: [name] Other Clinician: Referring Provider: Treating Provider/Extender: [name] in Treatment: 1 Diagnosis Coding ICD-10 Codes Code Description T81.31XA Disruption of external operation (surgical) wound, not elsewhere classified, initial encounter L98.492 Non-pressure chronic ulcer of skin of other sites with fat layer exposed Z79.01 Long term (current) use of anticoagulants Z86.718 Personal history of other venous thrombosis and embolism Facility Procedures CPT4 Code: [id number] Description: 99214 - WOUND CARE VISIT-LEV 4 EST PT Modifier: Quantity: 1 Physician Procedures : CPT4 Code Description Modifier [id number] - WC PHYS LEVEL 4 - EST PT ICD-10 Diagnosis Description T81.31XA Disruption of external operation (surgical) wound, not elsewhere classified, initial encounter 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

## 2020-11-05 NOTE — Progress Notes (Signed)
Roger Brennan, Roger Brennan ([id number]) Visit Report for 11/04/2020 Arrival Information Details Patient Name: Date of Service: [facility] 11/04/2020 1:15 PM Medical Record Number: [id number] Patient Account Number: [id number] Date of Birth/Sex: Treating RN: [birth year] (60 y.o. Roger Brennan, Meta.Reding Primary Care Travin Marik: Roger Brennan Other Clinician: Referring Roger Brennan: Treating Kania Regnier/Extender: Roger Brennan in Treatment: 1 Visit Information History Since Last Visit Added or deleted any medications: No Patient Arrived: Ambulatory Any new allergies or adverse reactions: No Arrival Time: 13:23 Had a fall or experienced change in No Accompanied By: self activities of daily living that may affect Transfer Assistance: None risk of falls: Patient Identification Verified: Yes Signs or symptoms of abuse/neglect since last visito No Secondary Verification Process Completed: Yes Hospitalized since last visit: No Patient Requires Transmission-Based Precautions: No Implantable device outside of the clinic excluding No Patient Has Alerts: No cellular tissue based products placed in the center since last visit: Has Dressing in Place as Prescribed: Yes Pain Present Now: No Electronic Signature(s) Signed: 11/05/2020 6:25:33 PM By: Deon Pilling RN, BSN Entered By: Deon Pilling on 11/04/2020 13:23:46 -------------------------------------------------------------------------------- Clinic Level of Care Assessment Details Patient Name: Date of Service: Roger Brennan [facility] 11/04/2020 1:15 PM Medical Record Number: [id number] Patient Account Number: [id number] Date of Birth/Sex: Treating RN: 1960-03-11 (60 y.o. Roger Brennan, Roger Brennan Primary Care Mahreen Schewe: Roger Brennan Other Clinician: Referring Adedamola Seto: Treating Roger Brennan/Extender: Roger Brennan in Treatment: 1 Clinic Level of Care Assessment Items TOOL 4 Quantity Score X- 1 0 Use when only an EandM is  performed on FOLLOW-UP visit ASSESSMENTS - Nursing Assessment / Reassessment X- 1 10 Reassessment of Co-morbidities (includes updates in patient status) X- 1 5 Reassessment of Adherence to Treatment Plan ASSESSMENTS - Wound and Skin A ssessment / Reassessment []  - 0 Simple Wound Assessment / Reassessment - one wound X- 2 5 Complex Wound Assessment / Reassessment - multiple wounds X- 1 10 Dermatologic / Skin Assessment (not related to wound area) ASSESSMENTS - Focused Assessment []  - 0 Circumferential Edema Measurements - multi extremities X- 1 10 Nutritional Assessment / Counseling / Intervention []  - 0 Lower Extremity Assessment (monofilament, tuning fork, pulses) []  - 0 Peripheral Arterial Disease Assessment (using hand held doppler) ASSESSMENTS - Ostomy and/or Continence Assessment and Care []  - 0 Incontinence Assessment and Management []  - 0 Ostomy Care Assessment and Management (repouching, etc.) PROCESS - Coordination of Care []  - 0 Simple Patient / Family Education for ongoing care X- 1 20 Complex (extensive) Patient / Family Education for ongoing care X- 1 10 Staff obtains Consents, Records, T Results / Process Orders est X- 1 10 Staff telephones HHA, Nursing Homes / Clarify orders / etc []  - 0 Routine Transfer to another Facility (non-emergent condition) []  - 0 Routine Hospital Admission (non-emergent condition) []  - 0 New Admissions / Biomedical engineer / Ordering NPWT Apligraf, etc. , []  - 0 Emergency Hospital Admission (emergent condition) []  - 0 Simple Discharge Coordination X- 1 15 Complex (extensive) Discharge Coordination PROCESS - Special Needs []  - 0 Pediatric / Minor Patient Management []  - 0 Isolation Patient Management []  - 0 Hearing / Language / Visual special needs []  - 0 Assessment of Community assistance (transportation, D/C planning, etc.) []  - 0 Additional assistance / Altered mentation []  - 0 Support Surface(s) Assessment  (bed, cushion, seat, etc.) INTERVENTIONS - Wound Cleansing / Measurement []  - 0 Simple Wound Cleansing - one wound X- 2 5 Complex Wound Cleansing - multiple wounds X- 1  5 Wound Imaging (photographs - any number of wounds) []  - 0 Wound Tracing (instead of photographs) []  - 0 Simple Wound Measurement - one wound X- 2 5 Complex Wound Measurement - multiple wounds INTERVENTIONS - Wound Dressings X - Small Wound Dressing one or multiple wounds 2 10 []  - 0 Medium Wound Dressing one or multiple wounds []  - 0 Large Wound Dressing one or multiple wounds X- 1 5 Application of Medications - topical []  - 0 Application of Medications - injection INTERVENTIONS - Miscellaneous []  - 0 External ear exam []  - 0 Specimen Collection (cultures, biopsies, blood, body fluids, etc.) []  - 0 Specimen(s) / Culture(s) sent or taken to Lab for analysis []  - 0 Patient Transfer (multiple staff / Civil Service fast streamer / Similar devices) []  - 0 Simple Staple / Suture removal (25 or less) []  - 0 Complex Staple / Suture removal (26 or more) []  - 0 Hypo / Hyperglycemic Management (close monitor of Blood Glucose) []  - 0 Ankle / Brachial Index (ABI) - do not check if billed separately X- 1 5 Vital Signs Has the patient been seen at the hospital within the last three years: Yes Total Score: 155 Level Of Care: New/Established - Level 4 Electronic Signature(s) Signed: 11/05/2020 6:25:33 PM By: Deon Pilling RN, BSN Entered By: Deon Pilling on 11/04/2020 13:59:21 -------------------------------------------------------------------------------- Encounter Discharge Information Details Patient Name: Date of Service: [facility] 11/04/2020 1:15 PM Medical Record Number: [id number] Patient Account Number: [id number] Date of Birth/Sex: Treating RN: August 04, 1960 (60 y.o. Roger Brennan Primary Care Gordy Goar: Roger Brennan Other Clinician: Referring Emmanuell Kantz: Treating Khalfani Weideman/Extender: Roger Brennan in Treatment: 1 Encounter Discharge Information Items Discharge Condition: Stable Ambulatory Status: Ambulatory Discharge Destination: Home Transportation: Private Auto Accompanied By: self Schedule Follow-up Appointment: Yes Clinical Summary of Care: Electronic Signature(s) Signed: 11/05/2020 6:25:33 PM By: Deon Pilling RN, BSN Entered By: Deon Pilling on 11/04/2020 14:00:02 -------------------------------------------------------------------------------- Lower Extremity Assessment Details Patient Name: Date of Service: [facility] 11/04/2020 1:15 PM Medical Record Number: [id number] Patient Account Number: [id number] Date of Birth/Sex: Treating RN: [birth year] (60 y.o. Roger Brennan Primary Care Margie Urbanowicz: Roger Brennan Other Clinician: Referring Mattox Schorr: Treating Ermin Parisien/Extender: Roger Brennan in Treatment: 1 Electronic Signature(s) Signed: 11/05/2020 6:25:33 PM By: Deon Pilling RN, BSN Entered By: Deon Pilling on 11/04/2020 13:24:01 -------------------------------------------------------------------------------- [place] Details Patient Name: Date of Service: Roger Brennan [facility] 11/04/2020 1:15 PM Medical Record Number: [id number] Patient Account Number: [id number] Date of Birth/Sex: Treating RN: 09/24/1960 (60 y.o. Roger Brennan Primary Care Shi Grose: Roger Brennan Other Clinician: Referring Kanoa Phillippi: Treating Nathanyel Defenbaugh/Extender: Roger Brennan in Treatment: 1 [place] reviewed with physician Active Inactive Wound/Skin Impairment Nursing Diagnoses: Impaired tissue integrity Knowledge deficit related to ulceration/compromised skin integrity Goals: Patient/caregiver will verbalize understanding of skin care regimen Date Initiated: 10/28/2020 Target Resolution Date: 11/25/2020 Goal Status: Active Ulcer/skin breakdown will have a volume reduction of 30% by  week 4 Date Initiated: 10/28/2020 Target Resolution Date: 11/19/2020 Goal Status: Active Interventions: Assess patient/caregiver ability to obtain necessary supplies Assess patient/caregiver ability to perform ulcer/skin care regimen upon admission and as needed Assess ulceration(s) every visit Provide education on ulcer and skin care Treatment Activities: Skin care regimen initiated : 10/28/2020 Topical wound management initiated : 10/28/2020 Notes: Electronic Signature(s) Signed: 11/05/2020 6:25:33 PM By: Deon Pilling RN, BSN Entered By: Deon Pilling on 11/04/2020 13:33:41 -------------------------------------------------------------------------------- Pain Assessment Details Patient Name: Date of Service: Agustin Cree,  JO [facility] 11/04/2020 1:15 PM Medical Record Number: [id number] Patient Account Number: [id number] Date of Birth/Sex: Treating RN: Jun 30, 1960 (60 y.o. Roger Brennan Primary Care Jamey Harman: Roger Brennan Other Clinician: Referring Klint Lezcano: Treating Lovena Kluck/Extender: Roger Brennan in Treatment: 1 Active Problems Location of Pain Severity and Description of Pain Patient Has Paino No Site Locations Rate the pain. Rate the pain. Current Pain Level: 0 Pain Management and Medication Current Pain Management: Medication: No Cold Application: No Rest: No Massage: No Activity: No T.E.N.S.: No Heat Application: No Leg drop or elevation: No Is the Current Pain Management Adequate: Adequate How does your wound impact your activities of daily livingo Sleep: No Bathing: No Appetite: No Relationship With Others: No Bladder Continence: No Emotions: No Bowel Continence: No Work: No Toileting: No Drive: No Dressing: No Hobbies: No Engineer, maintenance) Signed: 11/05/2020 6:25:33 PM By: Deon Pilling RN, BSN Entered By: Deon Pilling on 11/04/2020  13:23:56 -------------------------------------------------------------------------------- Patient/Caregiver Education Details Patient Name: Date of Service: Roger Brennan [facility] 10/5/2022andnbsp1:15 PM Medical Record Number: [id number] Patient Account Number: [id number] Date of Birth/Gender: Treating RN: 12/10/1960 (60 y.o. Roger Brennan Primary Care Physician: Roger Brennan Other Clinician: Referring Physician: Treating Physician/Extender: Roger Brennan in Treatment: 1 Education Assessment Education Provided To: Patient Education Topics Provided Wound/Skin Impairment: Handouts: Skin Care Do's and Dont's Methods: Explain/Verbal Responses: Reinforcements needed Electronic Signature(s) Signed: 11/05/2020 6:25:33 PM By: Deon Pilling RN, BSN Entered By: Deon Pilling on 11/04/2020 13:33:50 -------------------------------------------------------------------------------- Wound Assessment Details Patient Name: Date of Service: Roger [facility] 11/04/2020 1:15 PM Medical Record Number: [id number] Patient Account Number: [id number] Date of Birth/Sex: Treating RN: April 07, 1960 (60 y.o. Roger Brennan, Roger Brennan Primary Care Myrical Andujo: Roger Brennan Other Clinician: Referring Raelie Lohr: Treating Kahealani Yankovich/Extender: Roger Brennan in Treatment: 1 Wound Status Wound Number: 1 Primary Etiology: Open Surgical Wound Wound Location: Abdomen - midline Wound Status: Open Wounding Event: Surgical Injury Comorbid History: Deep Vein Thrombosis, Gout Date Acquired: 08/15/2020 Weeks Of Treatment: 1 Clustered Wound: No Photos Wound Measurements Length: (cm) 0.7 Width: (cm) 0.3 Depth: (cm) 0.1 Area: (cm) 0.165 Volume: (cm) 0.016 % Reduction in Area: 63.2% % Reduction in Volume: 64.4% Epithelialization: Large (67-100%) Tunneling: No Undermining: No Wound Description Classification: Full Thickness Without Exposed Support Structures Wound Margin:  Flat and Intact Exudate Amount: Small Exudate Type: Serosanguineous Exudate Color: red, brown Foul Odor After Cleansing: No Slough/Fibrino No Wound Bed Granulation Amount: Large (67-100%) Exposed Structure Granulation Quality: Red Fascia Exposed: No Necrotic Amount: None Present (0%) Fat Layer (Subcutaneous Tissue) Exposed: Yes Tendon Exposed: No Muscle Exposed: No Joint Exposed: No Bone Exposed: No Treatment Notes Wound #1 (Abdomen - midline) Cleanser Normal Saline Discharge Instruction: Cleanse the wound with Normal Saline prior to applying a clean dressing using gauze sponges, not tissue or cotton balls. Peri-Wound Care Topical Primary Dressing Hydrofera Blue Ready Foam, 2.5 x2.5 in Discharge Instruction: Apply to wound bed as instructed Secondary Dressing Woven Gauze Sponge, Non-Sterile 4x4 in Discharge Instruction: Apply over primary dressing as directed. Woven Gauze Sponges 2x2 in Discharge Instruction: Apply over primary dressing folded to fill crease from incision Secured With 44M Medipore H Soft Cloth Surgical T 4 x 2 (in/yd) ape Discharge Instruction: Secure dressing with tape as directed. Compression Wrap Compression Stockings Add-Ons Electronic Signature(s) Signed: 11/05/2020 6:25:33 PM By: Deon Pilling RN, BSN Entered By: Deon Pilling on 11/04/2020 13:35:44 -------------------------------------------------------------------------------- Wound Assessment Details Patient Name: Date of Service: [facility] 11/04/2020 1:15 PM Medical  Record Number: [id number] Patient Account Number: [id number] Date of Birth/Sex: Treating RN: 31-Mar-1960 (60 y.o. Roger Brennan, Meta.Reding Primary Care Deneane Stifter: Roger Brennan Other Clinician: Referring Ebba Goll: Treating Costa Jha/Extender: Roger Brennan in Treatment: 1 Wound Status Wound Number: 2 Primary Etiology: Open Surgical Wound Wound Location: Left Abdomen - Lower Quadrant Wound Status:  Open Wounding Event: Surgical Injury Comorbid History: Deep Vein Thrombosis, Gout Date Acquired: 08/15/2020 Weeks Of Treatment: 1 Clustered Wound: No Photos Wound Measurements Length: (cm) 0.4 Width: (cm) 0.5 Depth: (cm) 0.1 Area: (cm) 0.157 Volume: (cm) 0.016 % Reduction in Area: 83.3% % Reduction in Volume: 83% Epithelialization: Large (67-100%) Tunneling: No Undermining: No Wound Description Classification: Full Thickness Without Exposed Support Structures Wound Margin: Flat and Intact Exudate Amount: Medium Exudate Type: Serosanguineous Exudate Color: red, brown Foul Odor After Cleansing: No Slough/Fibrino No Wound Bed Granulation Amount: Large (67-100%) Exposed Structure Granulation Quality: Red, Friable Fascia Exposed: No Necrotic Amount: None Present (0%) Fat Layer (Subcutaneous Tissue) Exposed: Yes Tendon Exposed: No Muscle Exposed: No Joint Exposed: No Bone Exposed: No Treatment Notes Wound #2 (Abdomen - Lower Quadrant) Wound Laterality: Left Cleanser Normal Saline Discharge Instruction: Cleanse the wound with Normal Saline prior to applying a clean dressing using gauze sponges, not tissue or cotton balls. Peri-Wound Care antifungal powder-micronazole nitrate 2% Discharge Instruction: Apply the powder to the area with dressing changes. Topical Primary Dressing Hydrofera Blue Ready Foam, 2.5 x2.5 in Discharge Instruction: Apply to wound bed as instructed Secondary Dressing Woven Gauze Sponge, Non-Sterile 4x4 in Discharge Instruction: Apply over primary dressing as directed. Woven Gauze Sponges 2x2 in Discharge Instruction: Apply over primary dressing folded to fill crease from incision Secured With 50M Medipore H Soft Cloth Surgical T 4 x 2 (in/yd) ape Discharge Instruction: Secure dressing with tape as directed. Compression Wrap Compression Stockings Add-Ons Electronic Signature(s) Signed: 11/05/2020 6:25:33 PM By: Deon Pilling RN, BSN Entered  By: Deon Pilling on 11/04/2020 13:35:14 -------------------------------------------------------------------------------- Vit[place] Details Patient Name: Date of Service: Roger Brennan [facility] 11/04/2020 1:15 PM Medical Record Number: [id number] Patient Account Number: [id number] Date of Birth/Sex: Treating RN: [birth year] (60 y.o. Roger Brennan, Roger Brennan Primary Care Doniesha Landau: Roger Brennan Other Clinician: Referring Anarosa Kubisiak: Treating Zianna Dercole/Extender: Roger Brennan in Treatment: 1 Vital Signs Time Taken: 13:25 Temperature (F): 98 Height (in): 70 Pulse (bpm): 88 Weight (lbs): 162 Respiratory Rate (breaths/min): 20 Body Mass Index (BMI): 23.2 Blood Pressure (mmHg): 123/85 Reference Range: 80 - 120 mg / dl Electronic Signature(s) Signed: 11/05/2020 6:25:33 PM By: Deon Pilling RN, BSN Entered By: Deon Pilling on 11/04/2020 67:67:20

## 2020-11-06 ENCOUNTER — Ambulatory Visit (HOSPITAL_COMMUNITY)
Admission: RE | Admit: 2020-11-06 | Discharge: 2020-11-06 | Disposition: A | Discharge status: Home / Self Care | Payer: Managed Care, Other (non HMO) | Source: Ambulatory Visit | Attending: Nurse Practitioner | Admitting: Nurse Practitioner

## 2020-11-06 DIAGNOSIS — Z933 Colostomy status: Secondary | ICD-10-CM | POA: Diagnosis not present

## 2020-11-06 DIAGNOSIS — L24B1 Irritant contact dermatitis related to digestive stoma or fistula: Secondary | ICD-10-CM | POA: Diagnosis not present

## 2020-11-06 DIAGNOSIS — K9400 Colostomy complication, unspecified: Secondary | ICD-10-CM | POA: Diagnosis not present

## 2020-11-06 NOTE — Progress Notes (Signed)
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       Midline abdominal wound- seen at wound care center.   Psychiatric/Behavioral: Negative.    All other systems reviewed and are negative. Vital signs:  BP 137/90 (BP Location: Right Arm)   Pulse 96   Temp 97.8 F (36.6 C) (Oral)   Resp 20   Ht 5\' 10"  (1.778 m)   Wt 73.5 kg   SpO2 100%   BMI 23.24 kg/m  Exam:  Physical Exam Vitals reviewed.  Constitutional:      Appearance: Normal appearance. He is normal weight.  Abdominal:     General: Abdomen is flat.  Skin:    General: Skin is warm.     Findings: Rash present.     Comments: Candidiasis in skin fold LLQ  Neurological:     Mental Status: He is alert.  Psychiatric:        Mood and Affect: Mood normal.    Stoma type/location:  LLQ colostomy Stomal assessment/size:  1 1/4" pink and moist stoma Peristomal assessment:  abdominal hair is clipped today.  Treatment options for stomal/peristomal skin: Barrier ring 2 piece 2 3/4" pouch Output: soft brown stool Ostomy pouching: 2pc. 2 3/4"  sent home with additional supplies.   Education provided:  [facility] is still in and patient is allowing them to perform pouch changes. Encouraged to change pouch with assistance as HH will soon discharge. He feels confident he can manage the changes.    Has been seen at wound care center for midline abdominal surgical wound.  Hydrofera Blue dressing in place.  Proximal end of wound is healed and distal end is improving.   LLQ flank with candidiasis in intertriginous fold.  Nystatin powder has been implemented and this is improving.     Impression/dx  LLQ colostomy and surgical abdominal wound Discussion  He has follow up  with surgeon next week and is eager to discuss reversal before the end of the year.  We make a follow up appointment today since [facility] is pulling out soon to ensure patient and wife are managing independently. Plan  Appointment 10/24    Visit time: 45 minutes.   Domenic Moras FNP-BC

## 2020-11-06 NOTE — Discharge Instructions (Signed)
Appointment 10/24.  Continue same pouch.

## 2020-11-18 ENCOUNTER — Other Ambulatory Visit: Payer: Self-pay

## 2020-11-18 ENCOUNTER — Encounter (HOSPITAL_BASED_OUTPATIENT_CLINIC_OR_DEPARTMENT_OTHER): Payer: Managed Care, Other (non HMO) | Admitting: Physician Assistant

## 2020-11-18 DIAGNOSIS — L98492 Non-pressure chronic ulcer of skin of other sites with fat layer exposed: Secondary | ICD-10-CM | POA: Diagnosis not present

## 2020-11-18 NOTE — Progress Notes (Addendum)
DAYMEIN, NUNNERY ([id number]) Visit Report for 11/18/2020 Chief Complaint Document Details Patient Name: Date of Service: Roger Brennan [facility] 11/18/2020 2:00 PM Medical Record Number: [id number] Patient Account Number: [id number] Date of Birth/Sex: Treating RN: 22-Jan-1961 (60 y.o. [name] Primary Care Provider: [name] Other Clinician: Referring Provider: Treating Provider/Extender: [name] in Treatment: 3 Information Obtained from: Patient Chief Complaint Dehisced surgical abdominal ulcers Electronic Signature(s) Signed: 11/18/2020 1:54:24 PM By: [name] PA-C Entered By: [name] on 11/18/2020 13:54:24 -------------------------------------------------------------------------------- HPI Details Patient Name: Date of Service: Roger Brennan [facility] 11/18/2020 2:00 PM Medical Record Number: [id number] Patient Account Number: [id number] Date of Birth/Sex: Treating RN: 1960/02/07 (60 y.o. [name] Primary Care Provider: [name] Other Clinician: Referring Provider: Treating Provider/Extender: [name] in Treatment: 3 History of Present Illness HPI Description: 10/28/2020 upon evaluation today patient presents for initial evaluation in the clinic concerning issues that he has unfortunately been having with his left lower quadrant abdominal region as well as the midline abdominal region. 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 Subsequently they never figured out exactly what caused this he got very ill and ended up having to have significant surgery he has a colostomy as well. With that being said these wounds have done extremely well considering how bad they were at one point although there is still a definitive opening at this point in 2 locations that he is seeing me today with regard to. His  initial surgery was on 08/12/2020 he had a repeat surgery on 08/15/2020 to go back in and clean everything out. He was in the ICU for 2 weeks. He did have a DVT while in the hospital and is on blood thinners for the next month. With that being said the good news is overall he is showing some signs of improvement and otherwise was very healthy before all this began the bad news is obviously that he still having quite a bit of an issue here with regard to his wounds. 11/04/2020 upon evaluation today patient appears to be doing well with regard to his wounds. In fact both are showing signs of excellent improvement which is great news. There does not appear to be any evidence of active infection which is also great news and overall I am extremely pleased with where we stand today. 11/18/2020 upon evaluation today patient appears to be doing well with regard to his wounds. In fact everything is completely healed and seems to be doing excellent. I see no signs of active infection at this time. No fevers, chills, nausea, vomiting, or diarrhea. Electronic Signature(s) Signed: 11/18/2020 2:47:37 PM By: [name] PA-C Entered By: [name] on 11/18/2020 14:47:36 -------------------------------------------------------------------------------- Physical Exam Details Patient Name: Date of Service: Roger Brennan [facility] 11/18/2020 2:00 PM Medical Record Number: [id number] Patient Account Number: [id number] Date of Birth/Sex: Treating RN: April 07, 1960 (60 y.o. [name] Primary Care Provider: [name] Other Clinician: Referring Provider: Treating Provider/Extender: [name] in Treatment: 3 Constitutional Well-nourished and well-hydrated in no acute distress. Respiratory normal breathing without difficulty. Psychiatric this patient is able to make decisions and demonstrates good insight into disease process. Alert and Oriented x 3. pleasant and  cooperative. Notes Upon inspection patient's wound bed showed signs of good granulation and epithelization at this point. Fortunately I do not see any signs  of infection systemically which is great and overall I am very pleased. Unfortunately he is going to have to have a repeat surgery to takedown the colostomy. When that happens if he needs our help as far as healing is concerned I will be happy to see him otherwise my hope is that we will go much better and he will have no complications. Electronic Signature(s) Signed: 11/18/2020 2:48:02 PM By: [name] PA-C Entered By: [name] on 11/18/2020 14:48:02 -------------------------------------------------------------------------------- Physician Orders Details Patient Name: Date of Service: Roger Brennan HN 11/18/2020 2:00 PM Medical Record Number: [id number] Patient Account Number: [id number] Date of Birth/Sex: Treating RN: [birth year] (60 y.o. [name] Primary Care Provider: [name] Other Clinician: Referring Provider: Treating Provider/Extender: [name] in Treatment: 3 Verbal / Phone Orders: No Diagnosis Coding ICD-10 Coding Code Description T81.31XA Disruption of external operation (surgical) wound, not elsewhere classified, initial encounter L98.492 Non-pressure chronic ulcer of skin of other sites with fat layer exposed Z79.01 Long term (current) use of anticoagulants Z86.718 Personal history of other venous thrombosis and embolism Discharge From [facility] Discharge from [place] - Call if any future wound care needs. [facility] for wound care. - From wound care stand point.- [facility] Electronic Signature(s) Signed: 11/18/2020 4:03:51 PM By: [name] PA-C Signed: 11/18/2020 5:51:35 PM By: Deon Pilling RN, BSN Entered By: Deon Pilling on 11/18/2020  14:31:37 -------------------------------------------------------------------------------- Problem List Details Patient Name: Date of Service: Roger Brennan HN 11/18/2020 2:00 PM Medical Record Number: [id number] Patient Account Number: [id number] Date of Birth/Sex: Treating RN: 04/12/1960 (60 y.o. [name] Primary Care Provider: [name] Other Clinician: Referring Provider: Treating Provider/Extender: [name] in Treatment: 3 Active Problems ICD-10 Encounter Code Description Active Date MDM Diagnosis T81.31XA Disruption of external operation (surgical) wound, not elsewhere classified, 10/28/2020 No Yes initial encounter L98.492 Non-pressure chronic ulcer of skin of other sites with fat layer exposed 10/28/2020 No Yes Z79.01 Long term (current) use of anticoagulants 10/28/2020 No Yes Z86.718 Personal history of other venous thrombosis and embolism 10/28/2020 No Yes Inactive Problems Resolved Problems Electronic Signature(s) Signed: 11/18/2020 1:54:19 PM By: [name] PA-C Entered By: [name] on 11/18/2020 13:54:18 -------------------------------------------------------------------------------- Progress Note Details Patient Name: Date of Service: Roger Brennan HN 11/18/2020 2:00 PM Medical Record Number: [id number] Patient Account Number: [id number] Date of Birth/Sex: Treating RN: 1960-05-04 (60 y.o. [name] Primary Care Provider: [name] Other Clinician: Referring Provider: Treating Provider/Extender: [name] in Treatment: 3 Subjective Chief Complaint Information obtained from Patient Dehisced surgical abdominal ulcers History of Present Illness (HPI) 10/28/2020 upon evaluation today patient presents for initial evaluation in the clinic concerning issues that he has unfortunately been having with his left lower quadrant abdominal region as well as the midline abdominal  region. 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 Subsequently they never figured out exactly what caused this he got very ill and ended up having to have significant surgery he has a colostomy as well. With that being said these wounds have done extremely well considering how bad they were at one point although there is still a definitive opening at this point in 2 locations that he is seeing me today with regard to. His initial surgery was on 08/12/2020 he had a repeat surgery on 08/15/2020  to go back in and clean everything out. He was in the ICU for 2 weeks. He did have a DVT while in the hospital and is on blood thinners for the next month. With that being said the good news is overall he is showing some signs of improvement and otherwise was very healthy before all this began the bad news is obviously that he still having quite a bit of an issue here with regard to his wounds. 11/04/2020 upon evaluation today patient appears to be doing well with regard to his wounds. In fact both are showing signs of excellent improvement which is great news. There does not appear to be any evidence of active infection which is also great news and overall I am extremely pleased with where we stand today. 11/18/2020 upon evaluation today patient appears to be doing well with regard to his wounds. In fact everything is completely healed and seems to be doing excellent. I see no signs of active infection at this time. No fevers, chills, nausea, vomiting, or diarrhea. Objective Constitutional Well-nourished and well-hydrated in no acute distress. Vitals Time Taken: 2:20 PM, Height: 70 in, Weight: 162 lbs, BMI: 23.2, Temperature: 98.5 F, Pulse: 76 bpm, Respiratory Rate: 20 breaths/min, Blood Pressure: 174/107 mmHg. Respiratory normal breathing without difficulty. Psychiatric this patient is able to make decisions and  demonstrates good insight into disease process. Alert and Oriented x 3. pleasant and cooperative. General Notes: Upon inspection patient's wound bed showed signs of good granulation and epithelization at this point. Fortunately I do not see any signs of infection systemically which is great and overall I am very pleased. Unfortunately he is going to have to have a repeat surgery to takedown the colostomy. When that happens if he needs our help as far as healing is concerned I will be happy to see him otherwise my hope is that we will go much better and he will have no complications. Integumentary (Hair, Skin) Wound #1 status is Open. Original cause of wound was Surgical Injury. The date acquired was: 08/15/2020. The wound has been in treatment 3 weeks. The wound is located on the Abdomen - midline. The wound measures 0cm length x 0cm width x 0cm depth; 0cm^2 area and 0cm^3 volume. There is no tunneling or undermining noted. There is a none present amount of drainage noted. The wound margin is flat and intact. There is no granulation within the wound bed. There is no necrotic tissue within the wound bed. Wound #2 status is Healed - Epithelialized. Original cause of wound was Surgical Injury. The date acquired was: 08/15/2020. The wound has been in treatment 3 weeks. The wound is located on the Left Abdomen - Lower Quadrant. The wound measures 0cm length x 0cm width x 0cm depth; 0cm^2 area and 0cm^3 volume. There is no tunneling or undermining noted. There is a none present amount of drainage noted. The wound margin is flat and intact. There is no granulation within the wound bed. There is no necrotic tissue within the wound bed. Assessment Active Problems ICD-10 Disruption of external operation (surgical) wound, not elsewhere classified, initial encounter Non-pressure chronic ulcer of skin of other sites with fat layer exposed Long term (current) use of anticoagulants Personal history of other  venous thrombosis and embolism Plan Discharge From [facility] Services: Discharge from [place] - Call if any future wound care needs. Home Health: [facility] for wound care. - From wound care stand point.- [facility] 1. Would recommend that  we go ahead and discontinue wound care services at this point as the patient is completely healed and seems to be doing excellent. He is in agreement with that plan. 2. I am also can recommend that we have the patient continue with monitoring for any signs of worsening if he has any issues he should let me know soon as possible my hope is however that he will not have any significant problems here going forward. We will see the patient back for follow-up visit as needed depending on how things proceed after his colostomy takedown surgery. Electronic Signature(s) Signed: 11/18/2020 2:49:06 PM By: [name] PA-C Entered By: [name] on 11/18/2020 14:49:06 -------------------------------------------------------------------------------- SuperBill Details Patient Name: Date of Service: Roger Brennan HN 11/18/2020 Medical Record Number: [id number] Patient Account Number: [id number] Date of Birth/Sex: Treating RN: [birth year] (59 y.o. [name] Primary Care Provider: [name] Other Clinician: Referring Provider: Treating Provider/Extender: [name] in Treatment: 3 Diagnosis Coding ICD-10 Codes Code Description T81.31XA Disruption of external operation (surgical) wound, not elsewhere classified, initial encounter L98.492 Non-pressure chronic ulcer of skin of other sites with fat layer exposed Z79.01 Long term (current) use of anticoagulants Z86.718 Personal history of other venous thrombosis and embolism Facility Procedures CPT4 Code: [id number] Description: 99214 - WOUND CARE VISIT-LEV 4 EST PT Modifier: Quantity: 1 Physician Procedures : CPT4 Code Description Modifier  [id number] - WC PHYS LEVEL 3 - EST PT ICD-10 Diagnosis Description T81.31XA Disruption of external operation (surgical) wound, not elsewhere classified, initial encounter L98.492 Non-pressure chronic ulcer of skin of  other sites with fat layer exposed Z79.01 Long term (current) use of anticoagulants Z86.718 Personal history of other venous thrombosis and embolism Quantity: 1 Electronic Signature(s) Signed: 11/18/2020 2:49:18 PM By: [name] PA-C Entered By: [name] on 11/18/2020 14:49:18

## 2020-11-18 NOTE — Progress Notes (Signed)
Roger Brennan, Roger Brennan ([id number]) Visit Report for 11/18/2020 Arrival Information Details Patient Name: Date of Service: Roger Brennan [facility] 11/18/2020 2:00 PM Medical Record Number: [id number] Patient Account Number: [id number] Date of Birth/Sex: Treating RN: [birth year] (60 y.o. Roger Brennan, Roger Brennan Primary Care Roger Brennan: Roger Brennan Other Clinician: Referring Roger Brennan: Treating Roger Brennan/Extender: Roger Brennan in Treatment: 3 Visit Information History Since Last Visit Added or deleted any medications: No Patient Arrived: Ambulatory Any new allergies or adverse reactions: No Arrival Time: 14:19 Had a fall or experienced change in No Accompanied By: self activities of daily living that may affect Transfer Assistance: None risk of falls: Patient Identification Verified: Yes Signs or symptoms of abuse/neglect since last visito No Secondary Verification Process Completed: Yes Hospitalized since last visit: No Patient Requires Transmission-Based Precautions: No Implantable device outside of the clinic excluding No Patient Has Alerts: No cellular tissue based products placed in the center since last visit: Has Dressing in Place as Prescribed: Yes Pain Present Now: No Electronic Signature(s) Signed: 11/18/2020 5:51:35 PM By: Roger Pilling RN, BSN Entered By: Roger Brennan on 11/18/2020 14:20:24 -------------------------------------------------------------------------------- Clinic Level of Care Assessment Details Patient Name: Date of Service: Roger Brennan Roger Brennan 11/18/2020 2:00 PM Medical Record Number: [id number] Patient Account Number: [id number] Date of Birth/Sex: Treating RN: 1960-11-22 (59 y.o. Roger Brennan, Roger Brennan Primary Care Roger Brennan: Roger Brennan Other Clinician: Referring Roger Brennan: Treating Kamarii Carton/Extender: Roger Brennan in Treatment: 3 Clinic Level of Care Assessment Items TOOL 4 Quantity Score X- 1 0 Use when only an EandM  is performed on FOLLOW-UP visit ASSESSMENTS - Nursing Assessment / Reassessment X- 1 10 Reassessment of Co-morbidities (includes updates in patient status) X- 1 5 Reassessment of Adherence to Treatment Plan ASSESSMENTS - Wound and Skin A ssessment / Reassessment []  - 0 Simple Wound Assessment / Reassessment - one wound X- 2 5 Complex Wound Assessment / Reassessment - multiple wounds X- 1 10 Dermatologic / Skin Assessment (not related to wound area) ASSESSMENTS - Focused Assessment []  - 0 Circumferential Edema Measurements - multi extremities X- 1 10 Nutritional Assessment / Counseling / Intervention []  - 0 Lower Extremity Assessment (monofilament, tuning fork, pulses) []  - 0 Peripheral Arterial Disease Assessment (using hand held doppler) ASSESSMENTS - Ostomy and/or Continence Assessment and Care []  - 0 Incontinence Assessment and Management []  - 0 Ostomy Care Assessment and Management (repouching, etc.) PROCESS - Coordination of Care []  - 0 Simple Patient / Family Education for ongoing care X- 1 20 Complex (extensive) Patient / Family Education for ongoing care X- 1 10 Staff obtains Consents, Records, T Results / Process Orders est X- 1 10 Staff telephones HHA, Nursing Homes / Clarify orders / etc []  - 0 Routine Transfer to another Facility (non-emergent condition) []  - 0 Routine Hospital Admission (non-emergent condition) []  - 0 New Admissions / Biomedical engineer / Ordering NPWT Apligraf, etc. , []  - 0 Emergency Hospital Admission (emergent condition) []  - 0 Simple Discharge Coordination X- 1 15 Complex (extensive) Discharge Coordination PROCESS - Special Needs []  - 0 Pediatric / Minor Patient Management []  - 0 Isolation Patient Management []  - 0 Hearing / Language / Visual special needs []  - 0 Assessment of Community assistance (transportation, D/C planning, etc.) []  - 0 Additional assistance / Altered mentation []  - 0 Support Surface(s)  Assessment (bed, cushion, seat, etc.) INTERVENTIONS - Wound Cleansing / Measurement []  - 0 Simple Wound Cleansing - one wound X- 2 5 Complex Wound Cleansing - multiple wounds X- 1  5 Wound Imaging (photographs - any number of wounds) []  - 0 Wound Tracing (instead of photographs) []  - 0 Simple Wound Measurement - one wound X- 2 5 Complex Wound Measurement - multiple wounds INTERVENTIONS - Wound Dressings []  - 0 Small Wound Dressing one or multiple wounds []  - 0 Medium Wound Dressing one or multiple wounds []  - 0 Large Wound Dressing one or multiple wounds []  - 0 Application of Medications - topical []  - 0 Application of Medications - injection INTERVENTIONS - Miscellaneous []  - 0 External ear exam []  - 0 Specimen Collection (cultures, biopsies, blood, body fluids, etc.) []  - 0 Specimen(s) / Culture(s) sent or taken to Lab for analysis []  - 0 Patient Transfer (multiple staff / Civil Service fast streamer / Similar devices) []  - 0 Simple Staple / Suture removal (25 or less) []  - 0 Complex Staple / Suture removal (26 or more) []  - 0 Hypo / Hyperglycemic Management (close monitor of Blood Glucose) []  - 0 Ankle / Brachial Index (ABI) - do not check if billed separately X- 1 5 Vital Signs Has the patient been seen at the hospital within the last three years: Yes Total Score: 130 Level Of Care: New/Established - Level 4 Electronic Signature(s) Signed: 11/18/2020 5:51:35 PM By: Roger Pilling RN, BSN Entered By: Roger Brennan on 11/18/2020 14:32:34 -------------------------------------------------------------------------------- Encounter Discharge Information Details Patient Name: Date of Service: Roger Brennan Roger Brennan 11/18/2020 2:00 PM Medical Record Number: [id number] Patient Account Number: [id number] Date of Birth/Sex: Treating RN: 11-23-1960 (60 y.o. Roger Brennan Primary Care Roger Brennan: Roger Brennan Other Clinician: Referring Roger Brennan: Treating Roger Brennan/Extender: Roger Brennan in Treatment: 3 Encounter Discharge Information Items Discharge Condition: Stable Ambulatory Status: Ambulatory Discharge Destination: Home Transportation: Private Auto Accompanied By: self Schedule Follow-up Appointment: No Clinical Summary of Care: Electronic Signature(s) Signed: 11/18/2020 5:51:35 PM By: Roger Pilling RN, BSN Entered By: Roger Brennan on 11/18/2020 14:32:59 -------------------------------------------------------------------------------- Lower Extremity Assessment Details Patient Name: Date of Service: Roger Brennan Roger Brennan 11/18/2020 2:00 PM Medical Record Number: [id number] Patient Account Number: [id number] Date of Birth/Sex: Treating RN: [birth year] (60 y.o. Roger Brennan Primary Care Anasophia Pecor: Roger Brennan Other Clinician: Referring Ria Redcay: Treating Hersh Minney/Extender: Roger Brennan in Treatment: 3 Electronic Signature(s) Signed: 11/18/2020 5:51:35 PM By: Roger Pilling RN, BSN Entered By: Roger Brennan on 11/18/2020 14:26:05 -------------------------------------------------------------------------------- [place] Details Patient Name: Date of Service: Roger Brennan Roger Brennan 11/18/2020 2:00 PM Medical Record Number: [id number] Patient Account Number: [id number] Date of Birth/Sex: Treating RN: [birth year] (60 y.o. Roger Brennan Primary Care Kymoni Lesperance: Roger Brennan Other Clinician: Referring Alyzah Pelly: Treating Juliahna Wiswell/Extender: Roger Brennan in Treatment: 3 [place] reviewed with physician Active Inactive Electronic Signature(s) Signed: 11/18/2020 5:51:35 PM By: Roger Pilling RN, BSN Entered By: Roger Brennan on 11/18/2020 14:31:50 -------------------------------------------------------------------------------- Pain Assessment Details Patient Name: Date of Service: Roger Brennan Roger Brennan 11/18/2020 2:00 PM Medical Record Number:  [id number] Patient Account Number: [id number] Date of Birth/Sex: Treating RN: 09/14/1960 (60 y.o. Roger Brennan Primary Care Ilhan Debenedetto: Roger Brennan Other Clinician: Referring Kerstyn Coryell: Treating Jemel Ono/Extender: Roger Brennan in Treatment: 3 Active Problems Location of Pain Severity and Description of Pain Patient Has Paino No Site Locations Rate the pain. Current Pain Level: 0 Pain Management and Medication Current Pain Management: Medication: No Cold Application: No Rest: No Massage: No Activity: No T.E.N.S.: No Heat Application: No Leg drop or elevation: No Is the Current Pain Management Adequate: Adequate How  does your wound impact your activities of daily livingo Sleep: No Bathing: No Appetite: No Relationship With Others: No Bladder Continence: No Emotions: No Bowel Continence: No Work: No Toileting: No Drive: No Dressing: No Hobbies: No Electronic Signature(s) Signed: 11/18/2020 5:51:35 PM By: Roger Pilling RN, BSN Signed: 11/18/2020 5:51:35 PM By: Roger Pilling RN, BSN Entered By: Roger Brennan on 11/18/2020 14:21:27 -------------------------------------------------------------------------------- Patient/Caregiver Education Details Patient Name: Date of Service: Roger Brennan Roger Brennan 10/19/2022andnbsp2:00 PM Medical Record Number: [id number] Patient Account Number: [id number] Date of Birth/Gender: Treating RN: 18-Jan-1961 (60 y.o. Roger Brennan Primary Care Physician: Roger Brennan Other Clinician: Referring Physician: Treating Physician/Extender: Roger Brennan in Treatment: 3 Education Assessment Education Provided To: Patient Education Topics Provided Wound/Skin Impairment: Handouts: Skin Care Do's and Dont's Methods: Explain/Verbal Responses: Reinforcements needed Electronic Signature(s) Signed: 11/18/2020 5:51:35 PM By: Roger Pilling RN, BSN Entered By: Roger Brennan on 11/18/2020  14:32:02 -------------------------------------------------------------------------------- Wound Assessment Details Patient Name: Date of Service: Roger Brennan Roger Brennan 11/18/2020 2:00 PM Medical Record Number: [id number] Patient Account Number: [id number] Date of Birth/Sex: Treating RN: [birth year] (60 y.o. Roger Brennan, Roger Brennan Primary Care Wadie Liew: Roger Brennan Other Clinician: Referring Aamari West: Treating Olan Kurek/Extender: Roger Brennan in Treatment: 3 Wound Status Wound Number: 1 Primary Etiology: Open Surgical Wound Wound Location: Abdomen - midline Wound Status: Open Wounding Event: Surgical Injury Comorbid History: Deep Vein Thrombosis, Gout Date Acquired: 08/15/2020 Weeks Of Treatment: 3 Clustered Wound: No Photos Wound Measurements Length: (cm) Width: (cm) Depth: (cm) Area: (cm) Volume: (cm) 0 % Reduction in Area: 100% 0 % Reduction in Volume: 100% 0 Epithelialization: Large (67-100%) 0 Tunneling: No 0 Undermining: No Wound Description Classification: Full Thickness Without Exposed Support Structures Wound Margin: Flat and Intact Exudate Amount: None Present Foul Odor After Cleansing: No Slough/Fibrino No Wound Bed Granulation Amount: None Present (0%) Exposed Structure Necrotic Amount: None Present (0%) Fascia Exposed: No Fat Layer (Subcutaneous Tissue) Exposed: No Tendon Exposed: No Muscle Exposed: No Joint Exposed: No Bone Exposed: No Electronic Signature(s) Signed: 11/18/2020 5:51:35 PM By: Roger Pilling RN, BSN Entered By: Roger Brennan on 11/18/2020 14:29:06 -------------------------------------------------------------------------------- Wound Assessment Details Patient Name: Date of Service: Roger Brennan Roger Brennan 11/18/2020 2:00 PM Medical Record Number: [id number] Patient Account Number: [id number] Date of Birth/Sex: Treating RN: 06-18-1960 (60 y.o. Roger Brennan, Roger Brennan Primary Care Tron Flythe: Roger Brennan Other  Clinician: Referring Deante Blough: Treating Kennice Finnie/Extender: Roger Brennan in Treatment: 3 Wound Status Wound Number: 2 Primary Etiology: Open Surgical Wound Wound Location: Left Abdomen - Lower Quadrant Wound Status: Healed - Epithelialized Wounding Event: Surgical Injury Comorbid History: Deep Vein Thrombosis, Gout Date Acquired: 08/15/2020 Weeks Of Treatment: 3 Clustered Wound: No Photos Wound Measurements Length: (cm) Width: (cm) Depth: (cm) Area: (cm) Volume: (cm) 0 % Reduction in Area: 100% 0 % Reduction in Volume: 100% 0 Epithelialization: Large (67-100%) 0 Tunneling: No 0 Undermining: No Wound Description Classification: Full Thickness Without Exposed Support Structures Wound Margin: Flat and Intact Exudate Amount: None Present Foul Odor After Cleansing: No Slough/Fibrino No Wound Bed Granulation Amount: None Present (0%) Exposed Structure Necrotic Amount: None Present (0%) Fascia Exposed: No Fat Layer (Subcutaneous Tissue) Exposed: No Tendon Exposed: No Muscle Exposed: No Joint Exposed: No Bone Exposed: No Electronic Signature(s) Signed: 11/18/2020 5:51:35 PM By: Roger Pilling RN, BSN Entered By: Roger Brennan on 11/18/2020 14:30:11 -------------------------------------------------------------------------------- Vitals Details Patient Name: Date of Service: Roger Brennan Roger Brennan 11/18/2020 2:00 PM Medical Record Number: [id number] Patient Account Number: [id number] Date  of Birth/Sex: Treating RN: October 08, 1960 (60 y.o. Roger Brennan, Roger Brennan Primary Care Calleen Alvis: Roger Brennan Other Clinician: Referring Grete Bosko: Treating Camrie Stock/Extender: Roger Brennan in Treatment: 3 Vital Signs Time Taken: 14:20 Temperature (F): 98.5 Height (in): 70 Pulse (bpm): 76 Weight (lbs): 162 Respiratory Rate (breaths/min): 20 Body Mass Index (BMI): 23.2 Blood Pressure (mmHg): 174/107 Reference Range: 80 - 120 mg /  dl Electronic Signature(s) Signed: 11/18/2020 5:51:35 PM By: Roger Pilling RN, BSN Entered By: Roger Brennan on 11/18/2020 14:21:20

## 2020-11-23 ENCOUNTER — Other Ambulatory Visit: Payer: Self-pay

## 2020-11-23 ENCOUNTER — Ambulatory Visit (HOSPITAL_COMMUNITY)
Admission: RE | Admit: 2020-11-23 | Discharge: 2020-11-23 | Disposition: A | Discharge status: Home / Self Care | Payer: Managed Care, Other (non HMO) | Source: Ambulatory Visit | Attending: Nurse Practitioner | Admitting: Nurse Practitioner

## 2020-11-23 DIAGNOSIS — Z433 Encounter for attention to colostomy: Secondary | ICD-10-CM | POA: Diagnosis present

## 2020-11-23 NOTE — Progress Notes (Signed)
[facility]   Reason for visit:  LUQ colostomy HPI:  Perforated diverticulum with colostomy ROS  Review of Systems  HENT:  Positive for trouble swallowing.   Gastrointestinal:        LUQ colostomy  Skin:  Positive for wound.       Wounds have healed.  Seen at [facility] wound care center.    Psychiatric/Behavioral: Negative.    All other systems reviewed and are negative. Vital signs:  BP (!) 180/100   Pulse 95   Temp 97.6 F (36.4 C)   SpO2 97%  Exam:  Physical Exam Abdominal:     General: A surgical scar is present. The ostomy site is clean.     Palpations: Abdomen is soft.       Comments: Midline scar and left flank wounds have healed.  Candidiasis has resolved. LLQ with mound of tissue beneath skin, presumably adipose tissue    Stoma type/location:  LUQ colostomy Stomal assessment/size:  pink moist, productive of soft brown stool Peristomal assessment:  intact Treatment options for stomal/peristomal skin: skin prep, barrier ring 2 1/4" pouch Output: soft brown stool Ostomy pouching: 2 pc. Pouch with barrier ring No issues with supplies.  HH is discharging soon Education provided:  Continue with current pouching.  Continue to gain independence with self care.  Awaiting reversal.      Impression/dx  Colostomy care Discussion  Continue ongoing care.  Reversal early 2023 anticipated by patient.  Plan  See back as needed.     Visit time: 40 minutes.   Domenic Moras FNP-BC

## 2020-11-24 ENCOUNTER — Other Ambulatory Visit (HOSPITAL_COMMUNITY): Payer: Self-pay

## 2020-11-24 ENCOUNTER — Telehealth: Payer: Self-pay | Admitting: *Deleted

## 2020-11-24 ENCOUNTER — Encounter: Payer: Self-pay | Admitting: Gastroenterology

## 2020-11-24 NOTE — Telephone Encounter (Signed)
While prepping chart for PV, noted pt is on Eliquis.  Also, he has never seen Dr. Ardis Hughs and is scheduled for a colonoscopy- he has an ostomy in place from abdominal wall perforation from diverticulitis in August, 2022.    LMOM to call office back to set up OV

## 2020-11-27 NOTE — Telephone Encounter (Signed)
Spoke with wife states patient is not on Eliquis anymore but still advised patient had to schedule an OV because it shows in chart. Scheduled OV 11/16 with Nevin Bloodgood. Kept the [place] 12/6 so patient could have a secured spot for colon before the end of the year with Dr. Ardis Hughs per Elmyra Ricks it was fine to keep.

## 2020-12-02 ENCOUNTER — Telehealth: Payer: Self-pay

## 2020-12-02 NOTE — Telephone Encounter (Signed)
Patient has a pending colonoscopy. Can he stop the Eliquis? Does he need to continue taking it daily. Patient wife wanted to confirm.   Call back phone [phone].

## 2020-12-03 NOTE — Telephone Encounter (Signed)
Spoke with patient's wife, [name], and informed her that he can stop the Eliquis because he has completed the treatment for DVT.

## 2020-12-07 ENCOUNTER — Other Ambulatory Visit: Payer: Self-pay

## 2020-12-07 ENCOUNTER — Ambulatory Visit (HOSPITAL_COMMUNITY)
Admission: RE | Admit: 2020-12-07 | Discharge: 2020-12-07 | Disposition: A | Discharge status: Home / Self Care | Payer: Managed Care, Other (non HMO) | Source: Ambulatory Visit | Attending: Nurse Practitioner | Admitting: Nurse Practitioner

## 2020-12-07 DIAGNOSIS — Z433 Encounter for attention to colostomy: Secondary | ICD-10-CM | POA: Diagnosis present

## 2020-12-07 NOTE — Progress Notes (Signed)
[facility]   Reason for visit:  LLQ colostomy HPI:  Perforated diverticulum with colostomy ROS  Review of Systems Vital signs:  BP (!) 181/111 (BP Location: Right Arm)   Pulse 99   Temp 97.7 F (36.5 C) (Oral)   Resp 18   Ht 5\' 10"  (1.778 m)   Wt 74 kg   SpO2 98%   BMI 23.41 kg/m  Exam:  Physical Exam Constitutional:      Appearance: He is normal weight.  Abdominal:     Palpations: Abdomen is soft.     Comments: Midline incision scar , healed.  Left flank scar, healed.  LUQ colostomy  Skin:    General: Skin is warm and dry.  Neurological:     General: No focal deficit present.     Mental Status: He is alert and oriented to person, place, and time.  Psychiatric:        Mood and Affect: Mood normal.        Behavior: Behavior normal.    Stoma type/location:  LUQ colostomy Stomal assessment/size:  1 1/4" pink moist stoma.  Producing thick brown stool Peristomal assessment:  hair is trimmed today to peristomal skin Treatment options for stomal/peristomal skin: skin prep and barrier ring Output: thick brown stool Ostomy pouching: 2pc. 2 3/4" pouch with barrier ring  Education provided:  skin intact  will set up with [facility] for ongoing supplies.     Impression/dx  colostomy Discussion  Changing every 4 days with no leaks.  HH has discharged.  Plan  See back in a month. Anticipates reversal in a couple month.     Visit time: 45 minutes.   Domenic Moras FNP-BC

## 2020-12-07 NOTE — Discharge Instructions (Signed)
Will set up with [facility].   Given 3 additional pouch sets today.

## 2020-12-16 ENCOUNTER — Ambulatory Visit (INDEPENDENT_AMBULATORY_CARE_PROVIDER_SITE_OTHER): Payer: Managed Care, Other (non HMO) | Admitting: Nurse Practitioner

## 2020-12-16 ENCOUNTER — Encounter: Payer: Self-pay | Admitting: Nurse Practitioner

## 2020-12-16 VITALS — BP 150/84 | HR 84 | Ht 70.0 in | Wt 175.0 lb

## 2020-12-16 DIAGNOSIS — Z1211 Encounter for screening for malignant neoplasm of colon: Secondary | ICD-10-CM | POA: Diagnosis not present

## 2020-12-16 DIAGNOSIS — Z933 Colostomy status: Secondary | ICD-10-CM | POA: Diagnosis not present

## 2020-12-16 DIAGNOSIS — K5792 Diverticulitis of intestine, part unspecified, without perforation or abscess without bleeding: Secondary | ICD-10-CM

## 2020-12-16 MED ORDER — NA SULFATE-K SULFATE-MG SULF 17.5-3.13-1.6 GM/177ML PO SOLN: 1.0000 | ORAL | 0 refills | Status: DC

## 2020-12-16 NOTE — Progress Notes (Signed)
ASSESSMENT AND PLAN    # 60 year old male with history of perforated diverticulitis July 2022. He is s/p partial colectomy with colostomy followed by small bowel resection with anastomosis, enterorrhaphy x2.  Patient has never had a screening colonoscopy.  He is for colostomy reversal early 2022.  --Needs screening colonoscopy through ostomy and a look at rectal cuff (unprepped) . The risks and benefits of colonoscopy with possible polypectomy / biopsies were discussed and the patient agrees to proceed.  # Hx of post-op RLE DVT treated with Eliquis. Treatment complete  HISTORY OF PRESENT ILLNESS     Chief Complaint : Colon cancer screening  Roger Brennan is a 60 y.o. male with a past medical history significant for HTN, diverticulitis complicated by perforation , DVT. See PMH below for any additional history.   Roger Brennan is a 60 year old male here for colon cancer screening.  He was scheduled as a direct colonoscopy but this was changed to an office visit as staff thought that he was still taking Eliquis.  He is already scheduled to have a screening colonoscopy with Dr. Ardis Hughs early December  Over the summer Roger Brennan was having lower abdominal pain.  He said that he ignored the pain for a few weeks ago emergency department 08/12/20 with worsening pain.  CT scan concerning for perforation and abdominal wall abscess.  He underwent drainage and debridement and a partial colectomy stomach (Dr. Kieth Brightly).  Following that he was taken back to the OR for intestinal injury and underwent LOA, small bowel resection with anastomosis, enterorrhaphy x2.  He went back a third time for more debriding.  His hospital course was complicated by acute blood loss anemia  and RLE DVT requiring Eliquis.  He was discharged to inpatient rehab early August and discharged home on 09/19/2020.    CTAP w/ contrast Inflammatory process in the left lower quadrant consisting of sigmoid diverticulosis with wall thickening,  surrounding inflammation and extraluminal gas as well as large fluid and gas collection extending into the left abdominal wall musculature and subcutaneous soft tissues in the left lower abdomen and pelvis. Findings concerning for perforated diverticulitis with associated fistulous connection to the abdominal wall. Perforated neoplasm cannot be completely excluded.   09/21/20 Hgb 10.5  Patient is doing fine with his ostomy bag.  He is supposed to have a reversal in January or February.  He takes MiraLAX daily.  Stool is consistency of pudding  PREVIOUS GI EVALUATIONS:   none   Past Medical History:  Diagnosis Date   Diverticulosis      Past Surgical History:  Procedure Laterality Date   ABDOMINAL WALL DEFECT REPAIR N/A 08/15/2020   Procedure: REPAIR ABDOMINAL WALL/REEXPLORATION OF ABDOMINAL WALL, IRRIGATION AND DEBRIDEMENT;  Surgeon: Erroll Luna, MD;  Location: [facility];  Service: General;  Laterality: N/A;   LAPAROTOMY N/A 08/14/2020   Procedure: EXPLORATORY LAPAROTOMY, REPAIR PERFORATED DIVERTICULUM;  Surgeon: Kieth Brightly, Arta Bruce, MD;  Location: [facility];  Service: General;  Laterality: N/A;   PARTIAL COLECTOMY N/A 08/12/2020   Procedure: EXPLORATORY LAPAROTOMY,PARTIAL COLECTOMY;  Surgeon: Mickeal Skinner, MD;  Location: [facility];  Service: General;  Laterality: N/A;   Family History  Adopted: Yes   Social History   Tobacco Use   Smoking status: Never    Passive exposure: Never   Smokeless tobacco: Never  Vaping Use   Vaping Use: Never used  Substance Use Topics   Alcohol use: Never   Drug use: Never   No current outpatient medications on file.  No current facility-administered medications for this visit.   No Known Allergies   Review of Systems: All systems reviewed and negative except where noted in HPI.    PHYSICAL EXAM :    Wt Readings from Last 3 Encounters:  12/16/20 175 lb (79.4 kg)  12/07/20 163 lb 2.3 oz (74 kg)  11/06/20 162 lb (73.5 kg)     BP (!) 150/84   Pulse 84   Ht 5\' 10"  (1.778 m)   Wt 175 lb (79.4 kg)   SpO2 98%   BMI 25.11 kg/m  Constitutional:  Generally well appearing male in no acute distress. Psychiatric: Pleasant. Normal mood and affect. Behavior is normal. EENT: Pupils normal.  Conjunctivae are normal. No scleral icterus. Neck supple.  Cardiovascular: Normal rate, regular rhythm. No edema Pulmonary/chest: Effort normal and breath sounds normal. No wheezing, rales or rhonchi. Abdominal: Soft, nondistended, nontender. Bowel sounds active throughout. There are no masses palpable. No hepatomegaly. Brown semi-solid stool in colostomy bag Neurological: Alert and oriented to person place and time. Skin: Skin is warm and dry. No rashes noted.  Roger Savoy, NP  12/16/2020, 2:18 PM  Cc:  Referring Provider Gurney Maxin, MD [facility]

## 2020-12-16 NOTE — Patient Instructions (Signed)
If you are age 60 or younger, your body mass index should be between 19-25. Your Body mass index is 25.11 kg/m. If this is out of the aformentioned range listed, please consider follow up with your Primary Care Provider.   ________________________________________________________  The [facility] providers would like to encourage you to use [facility] to communicate with providers for non-urgent requests or questions.  Due to long hold times on the telephone, sending your provider a message by [facility] may be a faster and more efficient way to get a response.  Please allow 48 business hours for a response.  Please remember that this is for non-urgent requests.  _______________________________________________________  Roger Brennan have been scheduled for a colonoscopy. Please follow written instructions given to you at your visit today.  Please pick up your prep supplies at the pharmacy within the next 1-3 days. If you use inhalers (even only as needed), please bring them with you on the day of your procedure.  We will contact you after speaking with Dr Ardis Hughs about the small volume enema to see if it is needed for the procedure.  Due to recent changes in healthcare laws, you may see the results of your imaging and laboratory studies on MyChart before your provider has had a chance to review them.  We understand that in some cases there may be results that are confusing or concerning to you. Not all laboratory results come back in the same time frame and the provider may be waiting for multiple results in order to interpret others.  Please give [place] 48 hours in order for your provider to thoroughly review all the results before contacting the office for clarification of your results.   Thank you for entrusting me with your care and choosing [facility].  [name], NP

## 2020-12-31 ENCOUNTER — Telehealth (HOSPITAL_COMMUNITY): Payer: Self-pay | Admitting: Nurse Practitioner

## 2020-12-31 NOTE — Telephone Encounter (Signed)
LM for pt to return call to reschedule his appointment due to extended temporary clinic closure.

## 2021-01-04 ENCOUNTER — Ambulatory Visit (HOSPITAL_COMMUNITY): Payer: Managed Care, Other (non HMO)

## 2021-01-05 ENCOUNTER — Encounter: Payer: Self-pay | Admitting: Gastroenterology

## 2021-01-05 ENCOUNTER — Ambulatory Visit (AMBULATORY_SURGERY_CENTER): Payer: Managed Care, Other (non HMO) | Admitting: Gastroenterology

## 2021-01-05 ENCOUNTER — Other Ambulatory Visit: Payer: Self-pay

## 2021-01-05 ENCOUNTER — Encounter: Payer: Managed Care, Other (non HMO) | Admitting: Gastroenterology

## 2021-01-05 VITALS — BP 146/93 | HR 63 | Temp 97.3°F | Resp 24 | Ht 70.0 in | Wt 175.0 lb

## 2021-01-05 DIAGNOSIS — Z1211 Encounter for screening for malignant neoplasm of colon: Secondary | ICD-10-CM | POA: Diagnosis present

## 2021-01-05 DIAGNOSIS — D120 Benign neoplasm of cecum: Secondary | ICD-10-CM

## 2021-01-05 DIAGNOSIS — K5792 Diverticulitis of intestine, part unspecified, without perforation or abscess without bleeding: Secondary | ICD-10-CM

## 2021-01-05 MED ORDER — SODIUM CHLORIDE 0.9 % IV SOLN: 500.0000 mL | Freq: Once | INTRAVENOUS | Status: DC

## 2021-01-05 NOTE — Progress Notes (Signed)
Pt's states no medical or surgical changes since previsit or office visit. 

## 2021-01-05 NOTE — Op Note (Signed)
[facility] Patient Name: Roger Brennan Procedure Date: 01/05/2021 8:03 AM MRN: [id number] Endoscopist: Milus Banister , MD Age: 60 Referring MD:  Date of Birth: 1960-07-17 Gender: Male Account #: [id number] Procedure:                Colonoscopy Indications:              Screening for colorectal malignant neoplasm Medicines:                Monitored Anesthesia Care Procedure:                Pre-Anesthesia Assessment:                           - Prior to the procedure, a History and Physical                            was performed, and patient medications and                            allergies were reviewed. The patient's tolerance of                            previous anesthesia was also reviewed. The risks                            and benefits of the procedure and the sedation                            options and risks were discussed with the patient.                            All questions were answered, and informed consent                            was obtained. Prior Anticoagulants: The patient has                            taken no previous anticoagulant or antiplatelet                            agents. ASA Grade Assessment: II - A patient with                            mild systemic disease. After reviewing the risks                            and benefits, the patient was deemed in                            satisfactory condition to undergo the procedure.                           After obtaining informed consent, the colonoscope  was passed under direct vision. Throughout the                            procedure, the patient's blood pressure, pulse, and                            oxygen saturations were monitored continuously. The                            CF [id number] #[id number] was introduced through the anus                            and advanced to the proximal end of the Hartman's                            pouch. Then the  scope was introduced through the                            ostomy and advanced to the the cecum, identified by                            appendiceal orifice and ileocecal valve. The                            colonoscopy was performed without difficulty. The                            patient tolerated the procedure well. The quality                            of the bowel preparation was good. The ileocecal                            valve, appendiceal orifice, and rectum were                            photographed. Scope In: 8:22:23 AM Scope Out: 8:31:56 AM Scope Withdrawal Time: 0 hours 5 minutes 39 seconds  Total Procedure Duration: 0 hours 9 minutes 33 seconds  Findings:                 Via ostomy:                           A 2 mm polyp was found in the cecum. The polyp was                            sessile. The polyp was removed with a cold snare.                            Resection and retrieval were complete.                           Multiple small and large-mouthed diverticula were  found in the left colon.                           Via anus:                           1. Inspisated mucous within otherwise normal                            Hartman's pouch, proximal end about 8-9cm from anal                            verge.                           The exam was otherwise without abnormality on                            direct views. Complications:            No immediate complications. Estimated blood loss:                            None. Estimated Blood Loss:     Estimated blood loss: none. Impression:               - One 2 mm polyp in the cecum, removed with a cold                            snare. Resected and retrieved.                           - Diverticulosis in the left colon.                           - The examination was otherwise normal on direct                            and retroflexion views (via anus and via left sided                             ostomy) Recommendation:           - Patient has a contact number available for                            emergencies. The signs and symptoms of potential                            delayed complications were discussed with the                            patient. Return to normal activities tomorrow.                            Written discharge instructions were provided to the  patient.                           - Resume previous diet.                           - Continue present medications.                           - Await pathology results. Milus Banister, MD 01/05/2021 8:36:37 AM This report has been signed electronically.

## 2021-01-05 NOTE — Progress Notes (Signed)
Report given to PACU, vss 

## 2021-01-05 NOTE — Progress Notes (Signed)
  The recent H&P (dated 12/16/2020) was reviewed, the patient was examined and there is no change in the patients condition since that H&P was completed.   Roger Brennan  01/05/2021, 8:10 AM

## 2021-01-05 NOTE — Patient Instructions (Signed)
Thank you for allowing [place] to care for you today! Await final results, approximately 2 weeks.  Will make recommendations at that time for potential future colonoscopy. Resume previous diet and medications today. Return to normal daily activities tomorrow.    YOU HAD AN ENDOSCOPIC PROCEDURE TODAY AT [facility]:   Refer to the procedure report that was given to you for any specific questions about what was found during the examination.  If the procedure report does not answer your questions, please call your gastroenterologist to clarify.  If you requested that your care partner not be given the details of your procedure findings, then the procedure report has been included in a sealed envelope for you to review at your convenience later.  YOU SHOULD EXPECT: Some feelings of bloating in the abdomen. Passage of more gas than usual.  Walking can help get rid of the air that was put into your GI tract during the procedure and reduce the bloating. If you had a lower endoscopy (such as a colonoscopy or flexible sigmoidoscopy) you may notice spotting of blood in your stool or on the toilet paper. If you underwent a bowel prep for your procedure, you may not have a normal bowel movement for a few days.  Please Note:  You might notice some irritation and congestion in your nose or some drainage.  This is from the oxygen used during your procedure.  There is no need for concern and it should clear up in a day or so.  SYMPTOMS TO REPORT IMMEDIATELY:  Following lower endoscopy (colonoscopy or flexible sigmoidoscopy):  Excessive amounts of blood in the stool  Significant tenderness or worsening of abdominal pains  Swelling of the abdomen that is new, acute  Fever of 100F or higher    For urgent or emergent issues, a gastroenterologist can be reached at any hour by calling [phone]. Do not use MyChart messaging for urgent concerns.    DIET:  We do recommend a small meal at first,  but then you may proceed to your regular diet.  Drink plenty of fluids but you should avoid alcoholic beverages for 24 hours.  ACTIVITY:  You should plan to take it easy for the rest of today and you should NOT DRIVE or use heavy machinery until tomorrow (because of the sedation medicines used during the test).    FOLLOW UP: Our staff will call the number listed on your records 48-72 hours following your procedure to check on you and address any questions or concerns that you may have regarding the information given to you following your procedure. If we do not reach you, we will leave a message.  We will attempt to reach you two times.  During this call, we will ask if you have developed any symptoms of COVID 19. If you develop any symptoms (ie: fever, flu-like symptoms, shortness of breath, cough etc.) before then, please call [phone].  If you test positive for Covid 19 in the 2 weeks post procedure, please call and report this information to [place].    If any biopsies were taken you will be contacted by phone or by letter within the next 1-3 weeks.  Please call us at [phone] if you have not heard about the biopsies in 3 weeks.    SIGNATURES/CONFIDENTIALITY: You and/or your care partner have signed paperwork which will be entered into your electronic medical record.  These signatures attest to the fact that that the information above on your After  Visit Summary has been reviewed and is understood.  Full responsibility of the confidentiality of this discharge information lies with you and/or your care-partner.

## 2021-01-05 NOTE — Progress Notes (Signed)
Called to room to assist during endoscopic procedure.  Patient ID and intended procedure confirmed with present staff. Received instructions for my participation in the procedure from the performing physician.  

## 2021-01-07 ENCOUNTER — Telehealth: Payer: Self-pay | Admitting: *Deleted

## 2021-01-07 NOTE — Telephone Encounter (Signed)
  Follow up Call-  Call back number 01/05/2021  Post procedure Call Back phone  # [phone]  Permission to leave phone message Yes    No answer at 2nd attempt follow up phone call.  Left message on voicemail.

## 2021-01-07 NOTE — Telephone Encounter (Signed)
Attempted f/u phone call. No answer. Left message. °

## 2021-01-08 ENCOUNTER — Encounter: Payer: Self-pay | Admitting: Gastroenterology

## 2021-01-11 ENCOUNTER — Ambulatory Visit (HOSPITAL_COMMUNITY)
Admission: RE | Admit: 2021-01-11 | Discharge: 2021-01-11 | Disposition: A | Discharge status: Home / Self Care | Payer: Managed Care, Other (non HMO) | Source: Ambulatory Visit | Attending: Nurse Practitioner | Admitting: Nurse Practitioner

## 2021-01-11 ENCOUNTER — Other Ambulatory Visit: Payer: Self-pay

## 2021-01-11 DIAGNOSIS — K9400 Colostomy complication, unspecified: Secondary | ICD-10-CM | POA: Diagnosis not present

## 2021-01-11 DIAGNOSIS — Y833 Surgical operation with formation of external stoma as the cause of abnormal reaction of the patient, or of later complication, without mention of misadventure at the time of the procedure: Secondary | ICD-10-CM | POA: Insufficient documentation

## 2021-01-11 DIAGNOSIS — Z433 Encounter for attention to colostomy: Secondary | ICD-10-CM | POA: Insufficient documentation

## 2021-01-11 NOTE — Discharge Instructions (Signed)
See me as needed 

## 2021-01-11 NOTE — Progress Notes (Signed)
[facility]   Reason for visit:  LLQ colostomy  anticipating reversal 03/2021 HPI:  Ruptured diverticulum with resulting colostomy ROS  Review of Systems  Constitutional:  Positive for activity change.  Skin:  Positive for wound.       Peristomal skin breakdown at 6:00  All other systems reviewed and are negative. Vital signs:  BP (!) 181/112 (BP Location: Right Arm)   Pulse 91   Temp 97.8 F (36.6 C) (Oral)   Resp 18   SpO2 99%  Exam:  Physical Exam Abdominal:     General: Abdomen is flat.     Comments: Scarring to abdomen and left flank from surgery.  Healed and intact   LLQ colostomy  Neurological:     Mental Status: He is alert.    Stoma type/location:  LLQ colostomy Stomal assessment/size:  1" pink patent Peristomal assessment:  partial thickness tissue loss at 6 o'clock.  Consistent withmedical adhesive related skin injury Treatment options for stomal/peristomal skin: stoma powder and skin prep to denuded skin.  Barrier ring to peristomal skin Output: soft brown stool Ostomy pouching: 2pc.  2 3/4" pouch  Education provided:  clipped hair around stoma.     Impression/dx  LLQ colostomy Discussion  Excited about upcoming reversal.  Has had some weight change. We discussed how change in abdominal plane can affect how pouch fits.  Plan  Continue same plan of care  see in one month.     Visit time: 45 minutes.   Domenic Moras FNP-BC

## 2021-01-28 ENCOUNTER — Encounter: Payer: Managed Care, Other (non HMO) | Admitting: Physical Medicine and Rehabilitation

## 2021-02-08 ENCOUNTER — Ambulatory Visit (HOSPITAL_COMMUNITY)
Admission: RE | Admit: 2021-02-08 | Discharge: 2021-02-08 | Disposition: A | Discharge status: Home / Self Care | Payer: Managed Care, Other (non HMO) | Source: Ambulatory Visit | Attending: Nurse Practitioner | Admitting: Nurse Practitioner

## 2021-02-08 ENCOUNTER — Other Ambulatory Visit: Payer: Self-pay

## 2021-02-08 DIAGNOSIS — Z933 Colostomy status: Secondary | ICD-10-CM | POA: Diagnosis not present

## 2021-02-08 DIAGNOSIS — K9400 Colostomy complication, unspecified: Secondary | ICD-10-CM

## 2021-02-08 DIAGNOSIS — L24B1 Irritant contact dermatitis related to digestive stoma or fistula: Secondary | ICD-10-CM | POA: Diagnosis not present

## 2021-02-08 NOTE — Discharge Instructions (Addendum)
Call clinic if needed.  Text me your surgery date!  Good luck and best wishes on a speedy recovery!

## 2021-02-08 NOTE — Progress Notes (Signed)
[facility]   Reason for visit:  LLQ diverting colostomy HPI:  Diverticulitis with rupture, s/p colostomy Reversal scheduled for 03/11/21 ROS  Review of Systems  Constitutional:        Is back to work and exercising regularly  Gastrointestinal:        LLQ colostomy  Psychiatric/Behavioral: Negative.    All other systems reviewed and are negative. Vital signs:  BP (!) 168/119    Pulse 78    Temp 98.2 F (36.8 C) (Oral)    Resp 17    SpO2 98%  Exam:  Physical Exam Constitutional:      Appearance: Normal appearance. He is normal weight.  Abdominal:     General: Abdomen is flat.     Palpations: Abdomen is soft.     Comments: Open wounds to midline abdomen and left flank healed and intact  Neurological:     General: No focal deficit present.     Mental Status: He is alert and oriented to person, place, and time.  Psychiatric:        Mood and Affect: Mood normal.        Behavior: Behavior normal.        Thought Content: Thought content normal.        Judgment: Judgment normal.    Stoma type/location:  LLQ colostomy, pink and moist Stomal assessment/size:  1"  Peristomal assessment:  abdominal hair is clipped today.  Pouch change performed Treatment options for stomal/peristomal skin: Barrier ring and 2 piece pouch  Output: soft brown stool Ostomy pouching: 2pc.  Pouch with barrier ring, stoma powder and skin prep Education provided:  Discussed history diverticulitis and foods.  He has not eaten any nuts, seeds, broccoli and many foods he enjoys since his illness.  We discuss  slowly introducing foods he is craving, chewing well and eating slowly.      Impression/dx  LLQ colostomy Discussion  Diet, reversal surgery Plan  No further appointments made.  Follow up as needed.     Visit time: 45 minutes.   Domenic Moras FNP-BC

## 2021-02-18 NOTE — Progress Notes (Signed)
Sent message, via epic in basket, requesting orders in epic from surgeon.  

## 2021-02-24 ENCOUNTER — Ambulatory Visit: Payer: Self-pay | Admitting: General Surgery

## 2021-02-24 DIAGNOSIS — M7989 Other specified soft tissue disorders: Secondary | ICD-10-CM

## 2021-02-24 NOTE — Patient Instructions (Signed)
DUE TO COVID-19 ONLY ONE VISITOR IS ALLOWED TO COME WITH YOU AND STAY IN THE WAITING ROOM ONLY DURING PRE OP AND PROCEDURE.   **NO VISITORS ARE ALLOWED IN THE SHORT STAY AREA OR RECOVERY ROOM!!**  IF YOU WILL BE ADMITTED INTO THE HOSPITAL YOU ARE ALLOWED ONLY TWO SUPPORT PEOPLE DURING VISITATION HOURS ONLY (7 AM -8PM)   The support person(s) must pass our screening, gel in and out, and wear a mask at all times, including in the patients room. Patients must also wear a mask when staff or their support person are in the room. Visitors GUEST BADGE MUST BE WORN VISIBLY  One adult visitor may remain with you overnight and MUST be in the room by 8 P.M.  No visitors under the age of 69. Any visitor under the age of 34 must be accompanied by an adult.    COVID SWAB TESTING MUST BE COMPLETED ON:  03/09/21 @ 8:00 AM   Site: [facility] [place] Enter: Main Entrance have a seat in the waiting area to the right of main entrance (DO NOT [place]!!!!!) Dial: [phone] to alert staff you have arrived  You are not required to quarantine, however you are required to wear a well-fitted mask when you are out and around people not in your household.  Hand Hygiene often Do NOT share personal items Notify your provider if you are in close contact with someone who has COVID or you develop fever 100.4 or greater, new onset of sneezing, cough, sore throat, shortness of breath or body aches.  [address], [address], must go inside of the hospital, NOT A DRIVE THRU!  (Must self quarantine after testing. Follow instructions on handout.)       Your procedure is scheduled on: 03/11/21   Report to [facility] Main Entrance    Report to short stay at: 5:15 AM   Call this number if you have problems the morning of surgery [phone]  CLEAR LIQUID DIET; STARTING THE DAY BEFORE SURGERY UNTIL: 4:30  AM. MAKE SURE YOU DRINK PLENTY FLUIDS THE DAY OF THE PREP.  Foods Allowed                                                                     Foods Excluded  Water, Black Coffee and tea, regular and decaf                             liquids that you cannot  Plain Jell-O in any flavor  (No red)                                           see through such as: Fruit ices (not with fruit pulp)                                     milk, soups, orange juice  Iced Popsicles (No red)                                    All solid food                                   Apple juices Sports drinks like Gatorade (No red) Lightly seasoned clear broth or consume(fat free) Sugar Sample Menu Breakfast                                Lunch                                     Supper Cranberry juice                    Beef broth                            Chicken broth Jell-O                                     Grape juice                           Apple juice Coffee or tea                        Jell-O                                      Popsicle                                                Coffee or tea                        Coffee or tea    DRINK 2 PRESURGERY ENSURE DRINKS THE NIGHT BEFORE SURGERY AT  1000 PM AND 1 PRESURGERY DRINK THE DAY OF THE PROCEDURE 3 HOURS PRIOR TO SCHEDULED SURGERY. NO SOLIDS AFTER MIDNIGHT THE DAY PRIOR TO THE SURGERY. NOTHING BY MOUTH EXCEPT CLEAR LIQUIDS UNTIL THREE HOURS PRIOR TO SCHEDULED SURGERY. PLEASE FINISH PRESURGERY ENSURE DRINK PER SURGEON ORDER 3 HOURS PRIOR TO SCHEDULED SURGERY TIME WHICH NEEDS TO BE COMPLETED AT : 4:30 AM.    The day of surgery:  Drink ONE (1) Pre-Surgery Clear Ensure or G2 at AM the morning of surgery. Drink in one sitting. Do not sip.  This drink was given to you during your hospital  pre-op appointment visit. Nothing else to drink after completing the  Pre-Surgery Clear Ensure or G2.          If you have questions, please contact your  surgeons office    Oral Hygiene is also important to reduce your risk of infection.  Remember - BRUSH YOUR TEETH THE MORNING OF SURGERY WITH YOUR REGULAR TOOTHPASTE   Do NOT smoke after Midnight   Take these medicines the morning of surgery with A SIP OF WATER: N/A  DO NOT TAKE ANY ORAL DIABETIC MEDICATIONS DAY OF YOUR SURGERY                              You may not have any metal on your body including hair pins, jewelry, and body piercing             Do not wear lotions, powders, perfumes/cologne, or deodorant              Men may shave face and neck.   Do not bring valuables to the hospital. [place].   Contacts, dentures or bridgework may not be worn into surgery.   Bring small overnight bag day of surgery.    Patients discharged on the day of surgery will not be allowed to drive home.  Someone needs to stay with you for the first 24 hours after anesthesia.   Special Instructions: Bring a copy of your healthcare power of attorney and living will documents         the day of surgery if you haven't scanned them before.              Please read over the following fact sheets you were given: IF YOU HAVE QUESTIONS ABOUT YOUR PRE-OP INSTRUCTIONS PLEASE CALL [phone]     [facility] - Preparing for Surgery Before surgery, you can play an important role.  Because skin is not sterile, your skin needs to be as free of germs as possible.  You can reduce the number of germs on your skin by washing with CHG (chlorahexidine gluconate) soap before surgery.  CHG is an antiseptic cleaner which kills germs and bonds with the skin to continue killing germs even after washing. Please DO NOT use if you have an allergy to CHG or antibacterial soaps.  If your skin becomes reddened/irritated stop using the CHG and inform your nurse when you arrive at Short Stay. Do not shave (including legs and underarms) for at  least 48 hours prior to the first CHG shower.  You may shave your face/neck. Please follow these instructions carefully:  1.  Shower with CHG Soap the night before surgery and the  morning of Surgery.  2.  If you choose to wash your hair, wash your hair first as usual with your  normal  shampoo.  3.  After you shampoo, rinse your hair and body thoroughly to remove the  shampoo.                           4.  Use CHG as you would any other liquid soap.  You can apply chg directly  to the skin and wash                       Gently with a scrungie or clean washcloth.  5.  Apply the CHG Soap to your body ONLY FROM THE NECK DOWN.   Do not use on face/ open  Wound or open sores. Avoid contact with eyes, ears mouth and genitals (private parts).                       Wash face,  Genitals (private parts) with your normal soap.             6.  Wash thoroughly, paying special attention to the area where your surgery  will be performed.  7.  Thoroughly rinse your body with warm water from the neck down.  8.  DO NOT shower/wash with your normal soap after using and rinsing off  the CHG Soap.                9.  Pat yourself dry with a clean towel.            10.  Wear clean pajamas.            11.  Place clean sheets on your bed the night of your first shower and do not  sleep with pets. Day of Surgery : Do not apply any lotions/deodorants the morning of surgery.  Please wear clean clothes to the hospital/surgery center.  FAILURE TO FOLLOW THESE INSTRUCTIONS MAY RESULT IN THE CANCELLATION OF YOUR SURGERY PATIENT SIGNATURE_________________________________  NURSE SIGNATURE__________________________________  ________________________________________________________________________   Adam Phenix  An incentive spirometer is a tool that can help keep your lungs clear and active. This tool measures how well you are filling your lungs with each breath. Taking long deep breaths  may help reverse or decrease the chance of developing breathing (pulmonary) problems (especially infection) following: A long period of time when you are unable to move or be active. BEFORE THE PROCEDURE  If the spirometer includes an indicator to show your best effort, your nurse or respiratory therapist will set it to a desired goal. If possible, sit up straight or lean slightly forward. Try not to slouch. Hold the incentive spirometer in an upright position. INSTRUCTIONS FOR USE  Sit on the edge of your bed if possible, or sit up as far as you can in bed or on a chair. Hold the incentive spirometer in an upright position. Breathe out normally. Place the mouthpiece in your mouth and seal your lips tightly around it. Breathe in slowly and as deeply as possible, raising the piston or the ball toward the top of the column. Hold your breath for 3-5 seconds or for as long as possible. Allow the piston or ball to fall to the bottom of the column. Remove the mouthpiece from your mouth and breathe out normally. Rest for a few seconds and repeat Steps 1 through 7 at least 10 times every 1-2 hours when you are awake. Take your time and take a few normal breaths between deep breaths. The spirometer may include an indicator to show your best effort. Use the indicator as a goal to work toward during each repetition. After each set of 10 deep breaths, practice coughing to be sure your lungs are clear. If you have an incision (the cut made at the time of surgery), support your incision when coughing by placing a pillow or rolled up towels firmly against it. Once you are able to get out of bed, walk around indoors and cough well. You may stop using the incentive spirometer when instructed by your caregiver.  RISKS AND COMPLICATIONS Take your time so you do not get dizzy or light-headed. If you are in pain, you may need to take or ask  for pain medication before doing incentive spirometry. It is harder to take a  deep breath if you are having pain. AFTER USE Rest and breathe slowly and easily. It can be helpful to keep track of a log of your progress. Your caregiver can provide you with a simple table to help with this. If you are using the spirometer at home, follow these instructions: [place] IF:  You are having difficultly using the spirometer. You have trouble using the spirometer as often as instructed. Your pain medication is not giving enough relief while using the spirometer. You develop fever of 100.5 F (38.1 C) or higher. SEEK IMMEDIATE MEDICAL CARE IF:  You cough up bloody sputum that had not been present before. You develop fever of 102 F (38.9 C) or greater. You develop worsening pain at or near the incision site. MAKE SURE YOU:  Understand these instructions. Will watch your condition. Will get help right away if you are not doing well or get worse. Document Released: 05/30/2006 Document Revised: 04/11/2011 Document Reviewed: 07/31/2006 [facility] Patient Information 2014 [place], [place].   ________________________________________________________________________

## 2021-02-25 ENCOUNTER — Encounter (HOSPITAL_COMMUNITY): Payer: Self-pay

## 2021-02-25 ENCOUNTER — Encounter (HOSPITAL_COMMUNITY)
Admission: RE | Admit: 2021-02-25 | Discharge: 2021-02-25 | Disposition: A | Discharge status: Home / Self Care | Payer: Managed Care, Other (non HMO) | Source: Ambulatory Visit | Attending: General Surgery | Admitting: General Surgery

## 2021-02-25 ENCOUNTER — Other Ambulatory Visit: Payer: Self-pay

## 2021-02-25 VITALS — BP 154/105 | HR 88 | Temp 98.2°F | Ht 70.0 in | Wt 173.0 lb

## 2021-02-25 DIAGNOSIS — E119 Type 2 diabetes mellitus without complications: Secondary | ICD-10-CM | POA: Insufficient documentation

## 2021-02-25 DIAGNOSIS — M7989 Other specified soft tissue disorders: Secondary | ICD-10-CM | POA: Diagnosis not present

## 2021-02-25 DIAGNOSIS — Z01812 Encounter for preprocedural laboratory examination: Secondary | ICD-10-CM | POA: Diagnosis present

## 2021-02-25 DIAGNOSIS — Z01818 Encounter for other preprocedural examination: Secondary | ICD-10-CM

## 2021-02-25 HISTORY — DX: Essential (primary) hypertension: I10

## 2021-02-25 LAB — BASIC METABOLIC PANEL
Anion gap: 10 (ref 5–15)
BUN: 15 mg/dL (ref 6–20)
CO2: 24 mmol/L (ref 22–32)
Calcium: 9.7 mg/dL (ref 8.9–10.3)
Chloride: 103 mmol/L (ref 98–111)
Creatinine, Ser: 0.8 mg/dL (ref 0.61–1.24)
GFR, Estimated: >60 mL/min (ref >60)
Glucose, Bld: 122 mg/dL — ABNORMAL HIGH (ref 70–99)
Potassium: 4.1 mmol/L (ref 3.5–5.1)
Sodium: 137 mmol/L (ref 135–145)

## 2021-02-25 LAB — CBC
HCT: 49.7 % (ref 39.0–52.0)
Hemoglobin: 17.5 g/dL — ABNORMAL HIGH (ref 13.0–17.0)
MCH: 31.7 pg (ref 26.0–34.0)
MCHC: 35.2 g/dL (ref 30.0–36.0)
MCV: 90 fL (ref 80.0–100.0)
Platelets: 182 10*3/uL (ref 150–400)
RBC: 5.52 MIL/uL (ref 4.22–5.81)
RDW: 13 % (ref 11.5–15.5)
WBC: 6.2 10*3/uL (ref 4.0–10.5)
nRBC: 0 % (ref 0.0–0.2)

## 2021-02-25 NOTE — Progress Notes (Signed)
Lab results: HGB: 17.5

## 2021-02-25 NOTE — Progress Notes (Signed)
COVID Vaccine Completed:NO Date COVID Vaccine completed: COVID vaccine manufacturer: [place] Test: 03/09/21. @ 8:00 AM PCP - Dr. [name] Cardiologist -   Chest x-ray -  EKG - 08/17/20 Stress Test -  ECHO -  Cardiac Cath -  Pacemaker/ICD device last checked:  Sleep Study -  CPAP -   Fasting Blood Sugar -  Checks Blood Sugar _____ times a day  Blood Thinner Instructions: Aspirin Instructions: Last Dose:  Anesthesia review:   Patient denies shortness of breath, fever, cough and chest pain at PAT appointment   Patient verbalized understanding of instructions that were given to them at the PAT appointment. Patient was also instructed that they will need to review over the PAT instructions again at home before surgery.

## 2021-02-26 LAB — HEMOGLOBIN A1C
Hgb A1c MFr Bld: 6.3 % — ABNORMAL HIGH (ref 4.8–5.6)
Mean Plasma Glucose: 134 mg/dL

## 2021-03-04 ENCOUNTER — Other Ambulatory Visit: Payer: Self-pay | Admitting: Physical Medicine and Rehabilitation

## 2021-03-04 MED ORDER — AMLODIPINE BESYLATE 5 MG PO TABS: 5.0000 mg | ORAL_TABLET | Freq: Every day | ORAL | 11 refills | Status: DC

## 2021-03-09 ENCOUNTER — Encounter (HOSPITAL_COMMUNITY)
Admission: RE | Admit: 2021-03-09 | Discharge: 2021-03-09 | Disposition: A | Discharge status: Home / Self Care | Payer: Managed Care, Other (non HMO) | Source: Ambulatory Visit | Attending: General Surgery | Admitting: General Surgery

## 2021-03-09 ENCOUNTER — Other Ambulatory Visit: Payer: Self-pay

## 2021-03-09 DIAGNOSIS — Z01818 Encounter for other preprocedural examination: Secondary | ICD-10-CM

## 2021-03-09 LAB — SARS CORONAVIRUS 2 (TAT 6-24 HRS): SARS Coronavirus 2: NEGATIVE

## 2021-03-11 ENCOUNTER — Inpatient Hospital Stay (HOSPITAL_COMMUNITY): Payer: Managed Care, Other (non HMO) | Admitting: Certified Registered Nurse Anesthetist

## 2021-03-11 ENCOUNTER — Other Ambulatory Visit: Payer: Self-pay

## 2021-03-11 ENCOUNTER — Encounter (HOSPITAL_COMMUNITY): Payer: Self-pay | Admitting: General Surgery

## 2021-03-11 ENCOUNTER — Encounter (HOSPITAL_COMMUNITY)
Admission: RE | Disposition: A | Discharge status: Home / Self Care | Payer: Self-pay | Source: Home / Self Care | Attending: General Surgery

## 2021-03-11 ENCOUNTER — Inpatient Hospital Stay (HOSPITAL_COMMUNITY)
Admission: RE | Admit: 2021-03-11 | Discharge: 2021-03-14 | DRG: 331 | Disposition: A | Discharge status: Home / Self Care | Payer: Managed Care, Other (non HMO) | Attending: General Surgery | Admitting: General Surgery

## 2021-03-11 DIAGNOSIS — Z20822 Contact with and (suspected) exposure to covid-19: Secondary | ICD-10-CM | POA: Diagnosis present

## 2021-03-11 DIAGNOSIS — Z433 Encounter for attention to colostomy: Secondary | ICD-10-CM | POA: Diagnosis present

## 2021-03-11 DIAGNOSIS — K660 Peritoneal adhesions (postprocedural) (postinfection): Secondary | ICD-10-CM | POA: Diagnosis present

## 2021-03-11 DIAGNOSIS — K5792 Diverticulitis of intestine, part unspecified, without perforation or abscess without bleeding: Secondary | ICD-10-CM | POA: Diagnosis present

## 2021-03-11 DIAGNOSIS — K5732 Diverticulitis of large intestine without perforation or abscess without bleeding: Secondary | ICD-10-CM | POA: Diagnosis not present

## 2021-03-11 DIAGNOSIS — E119 Type 2 diabetes mellitus without complications: Secondary | ICD-10-CM | POA: Diagnosis present

## 2021-03-11 DIAGNOSIS — Z9049 Acquired absence of other specified parts of digestive tract: Secondary | ICD-10-CM | POA: Diagnosis not present

## 2021-03-11 DIAGNOSIS — Z86718 Personal history of other venous thrombosis and embolism: Secondary | ICD-10-CM

## 2021-03-11 DIAGNOSIS — E559 Vitamin D deficiency, unspecified: Secondary | ICD-10-CM | POA: Diagnosis present

## 2021-03-11 DIAGNOSIS — Z01812 Encounter for preprocedural laboratory examination: Secondary | ICD-10-CM

## 2021-03-11 DIAGNOSIS — F4323 Adjustment disorder with mixed anxiety and depressed mood: Secondary | ICD-10-CM | POA: Diagnosis present

## 2021-03-11 DIAGNOSIS — I10 Essential (primary) hypertension: Secondary | ICD-10-CM | POA: Diagnosis present

## 2021-03-11 HISTORY — PX: COLOSTOMY REVERSAL: SHX5782

## 2021-03-11 LAB — CREATININE, SERUM
Creatinine, Ser: 0.98 mg/dL (ref 0.61–1.24)
GFR, Estimated: >60 mL/min (ref >60)

## 2021-03-11 LAB — CBC
HCT: 45.6 % (ref 39.0–52.0)
Hemoglobin: 15.9 g/dL (ref 13.0–17.0)
MCH: 31.6 pg (ref 26.0–34.0)
MCHC: 34.9 g/dL (ref 30.0–36.0)
MCV: 90.7 fL (ref 80.0–100.0)
Platelets: 242 10*3/uL (ref 150–400)
RBC: 5.03 MIL/uL (ref 4.22–5.81)
RDW: 13 % (ref 11.5–15.5)
WBC: 8.8 10*3/uL (ref 4.0–10.5)
nRBC: 0 % (ref 0.0–0.2)

## 2021-03-11 LAB — GLUCOSE, CAPILLARY: Glucose-Capillary: 92 mg/dL (ref 70–99)

## 2021-03-11 SURGERY — COLOSTOMY REVERSAL
Anesthesia: General

## 2021-03-11 MED ORDER — ALVIMOPAN 12 MG PO CAPS
12.0000 mg | ORAL_CAPSULE | ORAL | Status: AC
  Administered 2021-03-11: 12 mg via ORAL
  Filled 2021-03-11: qty 1

## 2021-03-11 MED ORDER — OXYCODONE HCL 5 MG PO TABS
5.0000 mg | ORAL_TABLET | Freq: Four times a day (QID) | ORAL | Status: DC | PRN
  Administered 2021-03-12 – 2021-03-13 (×6): 5 mg via ORAL
  Filled 2021-03-11 (×6): qty 1

## 2021-03-11 MED ORDER — BUPIVACAINE LIPOSOME 1.3 % IJ SUSP
INTRAMUSCULAR | Status: AC
  Filled 2021-03-11: qty 20

## 2021-03-11 MED ORDER — CHLORHEXIDINE GLUCONATE 0.12 % MT SOLN
15.0000 mL | Freq: Once | OROMUCOSAL | Status: AC
  Administered 2021-03-11: 15 mL via OROMUCOSAL

## 2021-03-11 MED ORDER — BUPIVACAINE-EPINEPHRINE 0.25% -1:200000 IJ SOLN
INTRAMUSCULAR | Status: DC | PRN
  Administered 2021-03-11: 30 mL

## 2021-03-11 MED ORDER — FENTANYL CITRATE (PF) 100 MCG/2ML IJ SOLN
INTRAMUSCULAR | Status: AC
  Filled 2021-03-11: qty 2

## 2021-03-11 MED ORDER — PROPOFOL 10 MG/ML IV BOLUS
INTRAVENOUS | Status: DC | PRN
  Administered 2021-03-11: 200 mg via INTRAVENOUS

## 2021-03-11 MED ORDER — BUPIVACAINE LIPOSOME 1.3 % IJ SUSP
INTRAMUSCULAR | Status: DC | PRN
  Administered 2021-03-11: 20 mL

## 2021-03-11 MED ORDER — HYDROMORPHONE HCL 1 MG/ML IJ SOLN
INTRAMUSCULAR | Status: DC | PRN
  Administered 2021-03-11: 1 mg via INTRAVENOUS

## 2021-03-11 MED ORDER — FENTANYL CITRATE (PF) 250 MCG/5ML IJ SOLN
INTRAMUSCULAR | Status: AC
  Filled 2021-03-11: qty 5

## 2021-03-11 MED ORDER — ONDANSETRON HCL 4 MG/2ML IJ SOLN
INTRAMUSCULAR | Status: DC | PRN
Start: 2021-03-11 — End: 2021-03-11
  Administered 2021-03-11: 4 mg via INTRAVENOUS

## 2021-03-11 MED ORDER — MIDAZOLAM HCL 2 MG/2ML IJ SOLN
INTRAMUSCULAR | Status: AC
  Filled 2021-03-11: qty 2

## 2021-03-11 MED ORDER — SIMETHICONE 80 MG PO CHEW: 40.0000 mg | CHEWABLE_TABLET | Freq: Four times a day (QID) | ORAL | Status: DC | PRN

## 2021-03-11 MED ORDER — METRONIDAZOLE 500 MG PO TABS: 1000.0000 mg | ORAL_TABLET | ORAL | Status: DC

## 2021-03-11 MED ORDER — ACETAMINOPHEN 500 MG PO TABS
1000.0000 mg | ORAL_TABLET | ORAL | Status: AC
  Administered 2021-03-11: 1000 mg via ORAL
  Filled 2021-03-11: qty 2

## 2021-03-11 MED ORDER — ORAL CARE MOUTH RINSE: 15.0000 mL | Freq: Once | OROMUCOSAL | Status: AC

## 2021-03-11 MED ORDER — DEXAMETHASONE SODIUM PHOSPHATE 4 MG/ML IJ SOLN
INTRAMUSCULAR | Status: DC | PRN
  Administered 2021-03-11: 5 mg via INTRAVENOUS

## 2021-03-11 MED ORDER — MIDAZOLAM HCL 5 MG/5ML IJ SOLN
INTRAMUSCULAR | Status: DC | PRN
  Administered 2021-03-11: 2 mg via INTRAVENOUS

## 2021-03-11 MED ORDER — NEOMYCIN SULFATE 500 MG PO TABS: 1000.0000 mg | ORAL_TABLET | ORAL | Status: DC

## 2021-03-11 MED ORDER — PROMETHAZINE HCL 25 MG/ML IJ SOLN: 6.2500 mg | INTRAMUSCULAR | Status: DC | PRN

## 2021-03-11 MED ORDER — ACETAMINOPHEN 500 MG PO TABS
1000.0000 mg | ORAL_TABLET | Freq: Four times a day (QID) | ORAL | Status: DC
  Administered 2021-03-11 – 2021-03-14 (×10): 1000 mg via ORAL
  Filled 2021-03-11 (×10): qty 2

## 2021-03-11 MED ORDER — FENTANYL CITRATE (PF) 100 MCG/2ML IJ SOLN
INTRAMUSCULAR | Status: DC | PRN
Start: 2021-03-11 — End: 2021-03-11
  Administered 2021-03-11: 50 ug via INTRAVENOUS
  Administered 2021-03-11: 100 ug via INTRAVENOUS
  Administered 2021-03-11 (×2): 50 ug via INTRAVENOUS
  Administered 2021-03-11: 100 ug via INTRAVENOUS

## 2021-03-11 MED ORDER — ONDANSETRON HCL 4 MG PO TABS: 4.0000 mg | ORAL_TABLET | Freq: Four times a day (QID) | ORAL | Status: DC | PRN

## 2021-03-11 MED ORDER — ENSURE SURGERY PO LIQD
237.0000 mL | Freq: Two times a day (BID) | ORAL | Status: DC
  Administered 2021-03-11 – 2021-03-14 (×4): 237 mL via ORAL

## 2021-03-11 MED ORDER — BUPIVACAINE LIPOSOME 1.3 % IJ SUSP: 20.0000 mL | Freq: Once | INTRAMUSCULAR | Status: DC

## 2021-03-11 MED ORDER — PHENYLEPHRINE 40 MCG/ML (10ML) SYRINGE FOR IV PUSH (FOR BLOOD PRESSURE SUPPORT)
PREFILLED_SYRINGE | INTRAVENOUS | Status: DC | PRN
  Administered 2021-03-11: 120 ug via INTRAVENOUS

## 2021-03-11 MED ORDER — HEPARIN SODIUM (PORCINE) 5000 UNIT/ML IJ SOLN
5000.0000 [IU] | Freq: Once | INTRAMUSCULAR | Status: AC
  Administered 2021-03-11: 5000 [IU] via SUBCUTANEOUS
  Filled 2021-03-11: qty 1

## 2021-03-11 MED ORDER — CHLORHEXIDINE GLUCONATE 4 % EX LIQD: 1.0000 "application " | Freq: Once | CUTANEOUS | Status: DC

## 2021-03-11 MED ORDER — SODIUM CHLORIDE 0.45 % IV SOLN: INTRAVENOUS | Status: DC

## 2021-03-11 MED ORDER — POLYETHYLENE GLYCOL 3350 17 GM/SCOOP PO POWD: 1.0000 | Freq: Once | ORAL | Status: DC

## 2021-03-11 MED ORDER — PROPOFOL 500 MG/50ML IV EMUL
INTRAVENOUS | Status: AC
  Filled 2021-03-11: qty 50

## 2021-03-11 MED ORDER — SODIUM CHLORIDE 0.9 % IV SOLN
12.5000 mg | Freq: Four times a day (QID) | INTRAVENOUS | Status: DC | PRN
  Administered 2021-03-11: 12.5 mg via INTRAVENOUS
  Filled 2021-03-11: qty 0.5
  Filled 2021-03-11: qty 12.5

## 2021-03-11 MED ORDER — LIDOCAINE 2% (20 MG/ML) 5 ML SYRINGE
INTRAMUSCULAR | Status: DC | PRN
  Administered 2021-03-11: 100 mg via INTRAVENOUS

## 2021-03-11 MED ORDER — BUPIVACAINE-EPINEPHRINE (PF) 0.25% -1:200000 IJ SOLN
INTRAMUSCULAR | Status: AC
  Filled 2021-03-11: qty 30

## 2021-03-11 MED ORDER — ENSURE PRE-SURGERY PO LIQD
296.0000 mL | Freq: Once | ORAL | Status: DC
  Filled 2021-03-11: qty 296

## 2021-03-11 MED ORDER — SODIUM CHLORIDE 0.9 % IV SOLN
2.0000 g | INTRAVENOUS | Status: AC
  Administered 2021-03-11: 2 g via INTRAVENOUS
  Filled 2021-03-11: qty 2

## 2021-03-11 MED ORDER — ONDANSETRON HCL 4 MG/2ML IJ SOLN
INTRAMUSCULAR | Status: AC
  Filled 2021-03-11: qty 2

## 2021-03-11 MED ORDER — ACETAMINOPHEN 10 MG/ML IV SOLN: 1000.0000 mg | Freq: Once | INTRAVENOUS | Status: DC | PRN

## 2021-03-11 MED ORDER — DEXAMETHASONE SODIUM PHOSPHATE 10 MG/ML IJ SOLN
INTRAMUSCULAR | Status: AC
  Filled 2021-03-11: qty 1

## 2021-03-11 MED ORDER — SODIUM CHLORIDE 0.9 % IV SOLN: INTRAVENOUS | Status: DC

## 2021-03-11 MED ORDER — LIDOCAINE HCL (PF) 2 % IJ SOLN
INTRAMUSCULAR | Status: DC | PRN
  Administered 2021-03-11: 1.5 mg/kg/h via INTRADERMAL

## 2021-03-11 MED ORDER — ENOXAPARIN SODIUM 40 MG/0.4ML IJ SOSY
40.0000 mg | PREFILLED_SYRINGE | INTRAMUSCULAR | Status: DC
  Administered 2021-03-12 – 2021-03-14 (×3): 40 mg via SUBCUTANEOUS
  Filled 2021-03-11 (×3): qty 0.4

## 2021-03-11 MED ORDER — PHENYLEPHRINE 40 MCG/ML (10ML) SYRINGE FOR IV PUSH (FOR BLOOD PRESSURE SUPPORT)
PREFILLED_SYRINGE | INTRAVENOUS | Status: AC
  Filled 2021-03-11: qty 10

## 2021-03-11 MED ORDER — ROCURONIUM BROMIDE 10 MG/ML (PF) SYRINGE
PREFILLED_SYRINGE | INTRAVENOUS | Status: AC
  Filled 2021-03-11: qty 10

## 2021-03-11 MED ORDER — LIDOCAINE HCL 2 % IJ SOLN
INTRAMUSCULAR | Status: AC
  Filled 2021-03-11: qty 20

## 2021-03-11 MED ORDER — SUGAMMADEX SODIUM 200 MG/2ML IV SOLN
INTRAVENOUS | Status: DC | PRN
  Administered 2021-03-11: 200 mg via INTRAVENOUS

## 2021-03-11 MED ORDER — ALVIMOPAN 12 MG PO CAPS
12.0000 mg | ORAL_CAPSULE | Freq: Two times a day (BID) | ORAL | Status: DC
  Administered 2021-03-12: 12 mg via ORAL
  Filled 2021-03-11: qty 1

## 2021-03-11 MED ORDER — LACTATED RINGERS IR SOLN
Status: DC | PRN
  Administered 2021-03-11: 1000 mL

## 2021-03-11 MED ORDER — METOPROLOL TARTRATE 5 MG/5ML IV SOLN: 5.0000 mg | Freq: Four times a day (QID) | INTRAVENOUS | Status: DC | PRN

## 2021-03-11 MED ORDER — ONDANSETRON HCL 4 MG/2ML IJ SOLN
4.0000 mg | Freq: Four times a day (QID) | INTRAMUSCULAR | Status: DC | PRN
  Administered 2021-03-11: 4 mg via INTRAVENOUS

## 2021-03-11 MED ORDER — HYDROMORPHONE HCL 1 MG/ML IJ SOLN: 0.2500 mg | INTRAMUSCULAR | Status: DC | PRN

## 2021-03-11 MED ORDER — HYDROMORPHONE HCL 2 MG/ML IJ SOLN
INTRAMUSCULAR | Status: AC
  Filled 2021-03-11: qty 1

## 2021-03-11 MED ORDER — ONDANSETRON HCL 4 MG/2ML IJ SOLN
INTRAMUSCULAR | Status: AC
  Administered 2021-03-11: 4 mg
  Filled 2021-03-11: qty 2

## 2021-03-11 MED ORDER — LACTATED RINGERS IV SOLN: INTRAVENOUS | Status: DC

## 2021-03-11 MED ORDER — KETOROLAC TROMETHAMINE 15 MG/ML IJ SOLN
15.0000 mg | Freq: Four times a day (QID) | INTRAMUSCULAR | Status: DC | PRN
  Administered 2021-03-12: 15 mg via INTRAVENOUS
  Filled 2021-03-11: qty 1

## 2021-03-11 MED ORDER — MORPHINE SULFATE (PF) 2 MG/ML IV SOLN
2.0000 mg | INTRAVENOUS | Status: DC | PRN
  Administered 2021-03-11: 2 mg via INTRAVENOUS
  Filled 2021-03-11: qty 1

## 2021-03-11 MED ORDER — MEPERIDINE HCL 50 MG/ML IJ SOLN: 6.2500 mg | INTRAMUSCULAR | Status: DC | PRN

## 2021-03-11 MED ORDER — GABAPENTIN 300 MG PO CAPS
300.0000 mg | ORAL_CAPSULE | ORAL | Status: AC
  Administered 2021-03-11: 300 mg via ORAL
  Filled 2021-03-11: qty 1

## 2021-03-11 MED ORDER — ROCURONIUM BROMIDE 10 MG/ML (PF) SYRINGE
PREFILLED_SYRINGE | INTRAVENOUS | Status: DC | PRN
  Administered 2021-03-11: 30 mg via INTRAVENOUS
  Administered 2021-03-11: 70 mg via INTRAVENOUS
  Administered 2021-03-11: 20 mg via INTRAVENOUS

## 2021-03-11 MED ORDER — ENSURE PRE-SURGERY PO LIQD
592.0000 mL | Freq: Once | ORAL | Status: DC
  Filled 2021-03-11: qty 592

## 2021-03-11 MED ORDER — KETOROLAC TROMETHAMINE 15 MG/ML IJ SOLN: 15.0000 mg | Freq: Once | INTRAMUSCULAR | Status: DC

## 2021-03-11 SURGICAL SUPPLY — 86 items
APL PRP STRL LF DISP 70% ISPRP (MISCELLANEOUS) ×1
APL SKNCLS STERI-STRIP NONHPOA (GAUZE/BANDAGES/DRESSINGS)
APPLIER CLIP 5 13 M/L LIGAMAX5 (MISCELLANEOUS)
APPLIER CLIP ROT 10 11.4 M/L (STAPLE)
APR CLP MED LRG 11.4X10 (STAPLE)
APR CLP MED LRG 5 ANG JAW (MISCELLANEOUS)
BAG COUNTER SPONGE SURGICOUNT (BAG) IMPLANT
BAG SPNG CNTER NS LX DISP (BAG)
BENZOIN TINCTURE PRP APPL 2/3 (GAUZE/BANDAGES/DRESSINGS) IMPLANT
BLADE EXTENDED COATED 6.5IN (ELECTRODE) IMPLANT
BNDG ADH 1X3 SHEER STRL LF (GAUZE/BANDAGES/DRESSINGS) IMPLANT
BNDG ADH THN 3X1 STRL LF (GAUZE/BANDAGES/DRESSINGS)
CABLE HIGH FREQUENCY MONO STRZ (ELECTRODE) IMPLANT
CELLS DAT CNTRL 66122 CELL SVR (MISCELLANEOUS) IMPLANT
CHLORAPREP W/TINT 26 (MISCELLANEOUS) ×2 IMPLANT
CLIP APPLIE 5 13 M/L LIGAMAX5 (MISCELLANEOUS) IMPLANT
CLIP APPLIE ROT 10 11.4 M/L (STAPLE) IMPLANT
COUNTER NEEDLE 20 DBL MAG RED (NEEDLE) ×2 IMPLANT
COVER MAYO STAND STRL (DRAPES) ×6 IMPLANT
COVER SURGICAL LIGHT HANDLE (MISCELLANEOUS) ×2 IMPLANT
DRAIN CHANNEL 19F RND (DRAIN) IMPLANT
DRAPE LAPAROSCOPIC ABDOMINAL (DRAPES) ×2 IMPLANT
DRAPE UTILITY XL STRL (DRAPES) ×2 IMPLANT
DRSG OPSITE POSTOP 4X10 (GAUZE/BANDAGES/DRESSINGS) IMPLANT
DRSG OPSITE POSTOP 4X6 (GAUZE/BANDAGES/DRESSINGS) IMPLANT
DRSG OPSITE POSTOP 4X8 (GAUZE/BANDAGES/DRESSINGS) IMPLANT
DRSG TEGADERM 4X4.75 (GAUZE/BANDAGES/DRESSINGS) ×1 IMPLANT
ELECT REM PT RETURN 15FT ADLT (MISCELLANEOUS) ×2 IMPLANT
ENDOLOOP SUT PDS II  0 18 (SUTURE)
ENDOLOOP SUT PDS II 0 18 (SUTURE) IMPLANT
GAUZE SPONGE 4X4 12PLY STRL (GAUZE/BANDAGES/DRESSINGS) ×1 IMPLANT
GLOVE SURG POLYISO LF SZ7 (GLOVE) ×4 IMPLANT
GLOVE SURG UNDER POLY LF SZ7 (GLOVE) ×4 IMPLANT
GOWN STRL REUS W/TWL XL LVL3 (GOWN DISPOSABLE) ×8 IMPLANT
GRASPER SUT TROCAR 14GX15 (MISCELLANEOUS) ×1 IMPLANT
HANDLE SUCTION POOLE (INSTRUMENTS) IMPLANT
IRRIG SUCT STRYKERFLOW 2 WTIP (MISCELLANEOUS) ×2
IRRIGATION SUCT STRKRFLW 2 WTP (MISCELLANEOUS) ×1 IMPLANT
KIT SIGMOIDOSCOPE (SET/KITS/TRAYS/PACK) ×1 IMPLANT
KIT TURNOVER KIT A (KITS) IMPLANT
LEGGING LITHOTOMY PAIR STRL (DRAPES) ×2 IMPLANT
PACK COLON (CUSTOM PROCEDURE TRAY) ×2 IMPLANT
PAD POSITIONING PINK XL (MISCELLANEOUS) IMPLANT
PENCIL SMOKE EVACUATOR (MISCELLANEOUS) IMPLANT
PROTECTOR NERVE ULNAR (MISCELLANEOUS) IMPLANT
RELOAD STAPLE 60 2.6 WHT THN (STAPLE) IMPLANT
RELOAD STAPLER WHITE 60MM (STAPLE) IMPLANT
RETRACTOR WND ALEXIS 18 MED (MISCELLANEOUS) IMPLANT
RTRCTR WOUND ALEXIS 18CM MED (MISCELLANEOUS)
SCISSORS LAP 5X35 DISP (ENDOMECHANICALS) ×2 IMPLANT
SEALER TISSUE G2 STRG ARTC 35C (ENDOMECHANICALS) ×2 IMPLANT
SET TUBE SMOKE EVAC HIGH FLOW (TUBING) ×2 IMPLANT
SHEARS HARMONIC ACE PLUS 36CM (ENDOMECHANICALS) ×1 IMPLANT
SLEEVE XCEL OPT CAN 5 100 (ENDOMECHANICALS) ×4 IMPLANT
SPIKE FLUID TRANSFER (MISCELLANEOUS) ×2 IMPLANT
STAPLER ECHELON LONG 60 440 (INSTRUMENTS) IMPLANT
STAPLER ECHELON POWER CIR 31 (STAPLE) ×1 IMPLANT
STAPLER RELOAD WHITE 60MM (STAPLE)
STAPLER VISISTAT 35W (STAPLE) ×2 IMPLANT
STRIP CLOSURE SKIN 1/2X4 (GAUZE/BANDAGES/DRESSINGS) IMPLANT
SUCTION POOLE HANDLE (INSTRUMENTS)
SUT MNCRL AB 4-0 PS2 18 (SUTURE) ×1 IMPLANT
SUT PDS AB 0 CT1 36 (SUTURE) ×2 IMPLANT
SUT PROLENE 2 0 KS (SUTURE) ×1 IMPLANT
SUT SILK 2 0 (SUTURE) ×2
SUT SILK 2 0 SH CR/8 (SUTURE) ×2 IMPLANT
SUT SILK 2-0 18XBRD TIE 12 (SUTURE) ×1 IMPLANT
SUT SILK 3 0 (SUTURE) ×2
SUT SILK 3 0 SH CR/8 (SUTURE) ×2 IMPLANT
SUT SILK 3-0 18XBRD TIE 12 (SUTURE) ×1 IMPLANT
SUT VIC AB 2-0 SH 27 (SUTURE) ×2
SUT VIC AB 2-0 SH 27X BRD (SUTURE) ×1 IMPLANT
SUT VIC AB 3-0 SH 27 (SUTURE) ×2
SUT VIC AB 3-0 SH 27X BRD (SUTURE) IMPLANT
SUT VICRYL 0 ENDOLOOP (SUTURE) IMPLANT
SUT VICRYL 0 UR6 27IN ABS (SUTURE) ×2 IMPLANT
SYS LAPSCP GELPORT 120MM (MISCELLANEOUS)
SYS WOUND ALEXIS 18CM MED (MISCELLANEOUS) ×2
SYSTEM LAPSCP GELPORT 120MM (MISCELLANEOUS) IMPLANT
SYSTEM WOUND ALEXIS 18CM MED (MISCELLANEOUS) IMPLANT
TAPE CLOTH 4X10 WHT NS (GAUZE/BANDAGES/DRESSINGS) IMPLANT
TOWEL OR 17X26 10 PK STRL BLUE (TOWEL DISPOSABLE) IMPLANT
TOWEL OR NON WOVEN STRL DISP B (DISPOSABLE) ×2 IMPLANT
TRAY FOLEY MTR SLVR 16FR STAT (SET/KITS/TRAYS/PACK) IMPLANT
TROCAR BLADELESS OPT 5 100 (ENDOMECHANICALS) ×2 IMPLANT
TROCAR XCEL 12X100 BLDLESS (ENDOMECHANICALS) ×2 IMPLANT

## 2021-03-11 NOTE — H&P (Signed)
Roger Brennan is an 61 y.o. male.   Chief Complaint: colostomy HPI: 61 yo male who had colectomy for perforated diverticulitis. He presents for reversal.  Past Medical History:  Diagnosis Date   Diverticulosis    Hypertension     Past Surgical History:  Procedure Laterality Date   ABDOMINAL WALL DEFECT REPAIR N/A 08/15/2020   Procedure: REPAIR ABDOMINAL WALL/REEXPLORATION OF ABDOMINAL WALL, IRRIGATION AND DEBRIDEMENT;  Surgeon: Erroll Luna, MD;  Location: [facility];  Service: General;  Laterality: N/A;   LAPAROTOMY N/A 08/14/2020   Procedure: EXPLORATORY LAPAROTOMY, REPAIR PERFORATED DIVERTICULUM;  Surgeon: Kieth Brightly Arta Bruce, MD;  Location: [facility];  Service: General;  Laterality: N/A;   PARTIAL COLECTOMY N/A 08/12/2020   Procedure: EXPLORATORY LAPAROTOMY,PARTIAL COLECTOMY;  Surgeon: Mickeal Skinner, MD;  Location: [facility];  Service: General;  Laterality: N/A;    Family History  Adopted: Yes   Social History:  reports that he has never smoked. He has never been exposed to tobacco smoke. He has never used smokeless tobacco. He reports current alcohol use. He reports that he does not use drugs.  Allergies: No Known Allergies  Medications Prior to Admission  Medication Sig Dispense Refill   amLODipine (NORVASC) 5 MG tablet Take 1 tablet (5 mg total) by mouth daily. 30 tablet 11    No results found for this or any previous visit (from the past 48 hour(s)). No results found.  Review of Systems  Constitutional: Negative.   HENT: Negative.    Eyes: Negative.   Respiratory: Negative.    Cardiovascular: Negative.   Gastrointestinal: Negative.   Endocrine: Negative.   Genitourinary: Negative.   Musculoskeletal: Negative.   Skin: Negative.   Allergic/Immunologic: Negative.   Neurological: Negative.   Hematological: Negative.   Psychiatric/Behavioral: Negative.     Blood pressure (!) 140/99, pulse 75, temperature 98 F (36.7 C), resp. rate 16, weight 78.5 kg, SpO2 100  %. Physical Exam Vitals and nursing note reviewed.  Constitutional:      Appearance: He is well-developed.  HENT:     Head: Normocephalic and atraumatic.  Eyes:     General: No scleral icterus.    Conjunctiva/sclera: Conjunctivae normal.  Cardiovascular:     Rate and Rhythm: Normal rate and regular rhythm.  Pulmonary:     Effort: Pulmonary effort is normal.     Breath sounds: Normal breath sounds. No wheezing or rales.  Chest:     Chest wall: No tenderness.  Abdominal:     General: There is no distension.     Palpations: Abdomen is soft.     Tenderness: There is no abdominal tenderness. There is no rebound.     Comments: Ostomy in place  Musculoskeletal:        General: Normal range of motion.     Cervical back: Normal range of motion and neck supple.  Skin:    General: Skin is warm and dry.  Neurological:     Mental Status: He is alert and oriented to person, place, and time.  Psychiatric:        Behavior: Behavior normal.     Assessment/Plan 61 yo male with colostomy -lap colostomy reversal -inpatient admission  Mickeal Skinner, MD 03/11/2021, 7:26 AM

## 2021-03-11 NOTE — Op Note (Signed)
Preoperative diagnosis: colostomy in place, diverticulitis  Postoperative diagnosis: same   Procedure: lap colostomy reversal with colorectal anastomosis, lap adhesioloysis for 75 min  Surgeon: Gurney Maxin, M.D.  Asst: Michael Boston  Anesthesia: general  Indications for procedure: Roger Brennan is a 61 y.o. year old male with colostomy in place.  Description of procedure: The patient was brought into the operative suite. Anesthesia was administered with General endotracheal anesthesia. WHO checklist was applied. The patient was then placed in lithotomy position. The area was prepped and draped in the usual sterile fashion.  A right subcostal incision was made. A 6mm trocar was used to gain access to the peritoneal cavity by optical entry technique. Pneumoperitoneum was applied with a high flow and low pressure. The laparoscope was reinserted to confirm position.  There was a moderate amount of adhesions of the omentum to the abdominal wall. Marcaine:Exparel was used for a R TAP block. 1 5 mm trocar was placed in the right mid abdomen, 1 12 mm trocar was placed in the right lower abdomen. Harmonic and sharp dissection was used to remove the adhesions of the abdominal wall. The small intestine was scarred to the pelvis and left abdominal wall this was taken down with blunt and sharp technique. The adhesions around the ostomy were taken down with sharp technique. The rectal stump was identified and dilated transanally and appeared straight. The total adhesiolysis was 75 minutes.  The ostomy was dissected free of the skin and subcutaneous tissue. A 0 prolene was used as a pursestring. The colon was dilated up and fit a 31 mm anvil. The pursestring was tied down around the anvil. A wound protector was put in place at the ostomy site with cap and insufflation restarted. A 31 mm stapler was introduced transanally and anastomosis was created.  Rigid proctoscopy was performed showing no leak or  bleeding, anastomosis was seen at 15 cm. Hemostasis was inspected and one area of omentum was cauterized. The RLQ port fascia was closed with 0 vicryl by suture passer.  A colorectal clean change over occurred. The fascia of the ostomy site was freed up with cautery. Marcaine:Exparel was place into the muscle around the ostomy site. The peritoneum was closed with a running 0 vicryl. The anterior fascia was closed with running 0 PDS. A 3-0 vicryl was used to pursestring the skin and the wound was packed with saline gauze. 4-0 monocryl was used to close the laparoscopic incisions. Steristrips and bandaids were put in place for dressing. All counts were correct.  Findings: intact colorectal anastomosis  Specimen: none  Implant: gauze packing   Blood loss: 50 ml  Local anesthesia:  50 ml Exparel:Marcaine Mix  Complications: none  Gurney Maxin, M.D. General, Bariatric, & Minimally Invasive Surgery [facility]

## 2021-03-11 NOTE — Anesthesia Procedure Notes (Signed)
Procedure Name: Intubation Date/Time: 03/11/2021 7:40 AM Performed by: Claudia Desanctis, CRNA Pre-anesthesia Checklist: Patient identified, Emergency Drugs available, Suction available and Patient being monitored Patient Re-evaluated:Patient Re-evaluated prior to induction Oxygen Delivery Method: Circle system utilized Preoxygenation: Pre-oxygenation with 100% oxygen Induction Type: IV induction Ventilation: Mask ventilation without difficulty Laryngoscope Size: 2 and Miller Grade View: Grade II Tube type: Oral Tube size: 7.5 mm Number of attempts: 1 Airway Equipment and Method: Stylet Placement Confirmation: ETT inserted through vocal cords under direct vision, positive ETCO2 and breath sounds checked- equal and bilateral Secured at: 22 cm Tube secured with: Tape Dental Injury: Teeth and Oropharynx as per pre-operative assessment

## 2021-03-11 NOTE — Anesthesia Postprocedure Evaluation (Signed)
Anesthesia Post Note  Patient: Roger Brennan  Procedure(s) Performed: LAPAROSCOPIC VS OPEN COLOSTOMY REVERSAL     Patient location during evaluation: PACU Anesthesia Type: General Level of consciousness: awake Pain management: pain level controlled Vital Signs Assessment: post-procedure vital signs reviewed and stable Respiratory status: spontaneous breathing Cardiovascular status: stable Postop Assessment: no apparent nausea or vomiting Anesthetic complications: no   No notable events documented.  Last Vitals:  Vitals:   03/11/21 1451 03/11/21 1720  BP: (!) 141/84 132/87  Pulse: 88 94  Resp: 18 18  Temp: 36.7 C   SpO2: 100% 100%    Last Pain:  Vitals:   03/11/21 1652  TempSrc:   PainSc: 5                  Kenshawn F Tedi Hughson Jr

## 2021-03-11 NOTE — Anesthesia Preprocedure Evaluation (Signed)
Anesthesia Evaluation  Patient identified by MRN, date of birth, ID band Patient awake    Reviewed: Allergy & Precautions, NPO status , Patient's Chart, lab work & pertinent test results  Airway Mallampati: III  TM Distance: >3 FB Neck ROM: Full    Dental  (+) Missing, Dental Advisory Given, Poor Dentition,    Pulmonary neg pulmonary ROS,    Pulmonary exam normal breath sounds clear to auscultation       Cardiovascular hypertension, Pt. on medications negative cardio ROS Normal cardiovascular exam Rhythm:Regular Rate:Normal     Neuro/Psych negative neurological ROS  negative psych ROS   GI/Hepatic negative GI ROS, Neg liver ROS,   Endo/Other  negative endocrine ROS  Renal/GU negative Renal ROS  negative genitourinary   Musculoskeletal negative musculoskeletal ROS (+)   Abdominal Normal abdominal exam  (+)   Peds  Hematology  (+) Blood dyscrasia (Hgb 9.9), anemia ,   Anesthesia Other Findings   Reproductive/Obstetrics                             Anesthesia Physical  Anesthesia Plan  ASA: 2  Anesthesia Plan: General   Post-op Pain Management: Dilaudid IV   Induction: Intravenous  PONV Risk Score and Plan: 2 and Midazolam, Dexamethasone and Ondansetron  Airway Management Planned: Oral ETT  Additional Equipment: None  Intra-op Plan:   Post-operative Plan: Extubation in OR  Informed Consent: I have reviewed the patients History and Physical, chart, labs and discussed the procedure including the risks, benefits and alternatives for the proposed anesthesia with the patient or authorized representative who has indicated his/her understanding and acceptance.     Dental advisory given  Plan Discussed with: CRNA  Anesthesia Plan Comments:         Anesthesia Quick Evaluation

## 2021-03-11 NOTE — Transfer of Care (Signed)
Immediate Anesthesia Transfer of Care Note  Patient: Amin Tieken  Procedure(s) Performed: LAPAROSCOPIC VS OPEN COLOSTOMY REVERSAL  Patient Location: PACU  Anesthesia Type:General  Level of Consciousness: awake, alert , oriented and patient cooperative  Airway & Oxygen Therapy: Patient Spontanous Breathing and Patient connected to face mask  Post-op Assessment: Report given to RN and Post -op Vital signs reviewed and stable  Post vital signs: Reviewed and stable  Last Vitals:  Vitals Value Taken Time  BP 166/106 03/11/21 1035  Temp    Pulse 80 03/11/21 1036  Resp 11 03/11/21 1036  SpO2 100 % 03/11/21 1036  Vitals shown include unvalidated device data.  Last Pain:  Vitals:   03/11/21 0647  PainSc: 0-No pain         Complications: No notable events documented.

## 2021-03-12 ENCOUNTER — Encounter (HOSPITAL_COMMUNITY): Payer: Self-pay | Admitting: General Surgery

## 2021-03-12 LAB — BASIC METABOLIC PANEL
Anion gap: 9 (ref 5–15)
BUN: 13 mg/dL (ref 6–20)
CO2: 21 mmol/L — ABNORMAL LOW (ref 22–32)
Calcium: 9 mg/dL (ref 8.9–10.3)
Chloride: 105 mmol/L (ref 98–111)
Creatinine, Ser: 0.57 mg/dL — ABNORMAL LOW (ref 0.61–1.24)
GFR, Estimated: >60 mL/min (ref >60)
Glucose, Bld: 113 mg/dL — ABNORMAL HIGH (ref 70–99)
Potassium: 3.6 mmol/L (ref 3.5–5.1)
Sodium: 135 mmol/L (ref 135–145)

## 2021-03-12 LAB — CBC
HCT: 43.2 % (ref 39.0–52.0)
Hemoglobin: 15 g/dL (ref 13.0–17.0)
MCH: 31.6 pg (ref 26.0–34.0)
MCHC: 34.7 g/dL (ref 30.0–36.0)
MCV: 91.1 fL (ref 80.0–100.0)
Platelets: 264 10*3/uL (ref 150–400)
RBC: 4.74 MIL/uL (ref 4.22–5.81)
RDW: 12.9 % (ref 11.5–15.5)
WBC: 13.2 10*3/uL — ABNORMAL HIGH (ref 4.0–10.5)
nRBC: 0 % (ref 0.0–0.2)

## 2021-03-12 NOTE — Progress Notes (Signed)
Transition of Care [facility]) Screening Note  Patient Details  Name: Roger Brennan Date of Birth: 1960/04/03  Transition of Care [facility]) CM/SW Contact:    Sherie Don, LCSW Phone Number: 03/12/2021, 10:34 AM  Transition of Care Department [facility]) has reviewed patient and no TOC needs have been identified at this time. We will continue to monitor patient advancement through interdisciplinary progression rounds. If new patient transition needs arise, please place a TOC consult.

## 2021-03-12 NOTE — Progress Notes (Signed)
1 Day Post-Op   Chief Complaint/Subjective: Some nausea issues yesterday, much better today  Objective: Vital signs in last 24 hours: Temp:  [96.9 F (36.1 C)-98.4 F (36.9 C)] 97.6 F (36.4 C) (02/10 0531) Pulse Rate:  [66-98] 82 (02/10 0531) Resp:  [11-18] 18 (02/10 0531) BP: (132-152)/(83-94) 139/87 (02/10 0531) SpO2:  [98 %-100 %] 98 % (02/10 0531) Weight:  [82.8 kg-84.2 kg] 82.8 kg (02/10 0531) Last BM Date: 03/10/21 Intake/Output from previous day: 02/09 0701 - 02/10 0700 In: 2721 [P.O.:1260; I.V.:1311; IV Piggyback:150] Out: 3250 [Urine:3200; Blood:50] Intake/Output this shift: No intake/output data recorded.  PE: Gen: NAD Resp: nonlabored Card: RRR Abd: soft, NT, wound open with some saturation of gauze  Lab Results:  Recent Labs    03/11/21 1105 03/12/21 0421  WBC 8.8 13.2*  HGB 15.9 15.0  HCT 45.6 43.2  PLT 242 264   BMET Recent Labs    03/11/21 1105 03/12/21 0421  NA  --  135  K  --  3.6  CL  --  105  CO2  --  21*  GLUCOSE  --  113*  BUN  --  13  CREATININE 0.98 0.57*  CALCIUM  --  9.0   PT/INR No results for input(s): LABPROT, INR in the last 72 hours. CMP     Component Value Date/Time   NA 135 03/12/2021 0421   K 3.6 03/12/2021 0421   CL 105 03/12/2021 0421   CO2 21 (L) 03/12/2021 0421   GLUCOSE 113 (H) 03/12/2021 0421   BUN 13 03/12/2021 0421   CREATININE 0.57 (L) 03/12/2021 0421   CALCIUM 9.0 03/12/2021 0421   PROT 5.7 (L) 09/02/2020 0433   ALBUMIN 1.6 (L) 09/02/2020 0433   AST 22 09/02/2020 0433   ALT 29 09/02/2020 0433   ALKPHOS 156 (H) 09/02/2020 0433   BILITOT 0.4 09/02/2020 0433   GFRNONAA >60 03/12/2021 0421   GFRAA SPECIMEN CONTAMINATED, UNABLE TO PERFORM TEST(S). 08/19/2020 1236   Lipase     Component Value Date/Time   LIPASE 18 08/12/2020 1010    Studies/Results: No results found.  Anti-infectives: Anti-infectives (From admission, onward)    Start     Dose/Rate Route Frequency Ordered Stop   03/11/21  1400  neomycin (MYCIFRADIN) tablet 1,000 mg  Status:  Discontinued       See Hyperspace for full Linked Orders Report.   1,000 mg Oral 3 times per day 03/11/21 0629 03/11/21 9323   03/11/21 1400  metroNIDAZOLE (FLAGYL) tablet 1,000 mg  Status:  Discontinued       See Hyperspace for full Linked Orders Report.   1,000 mg Oral 3 times per day 03/11/21 0629 03/11/21 5573   03/11/21 0630  cefoTEtan (CEFOTAN) 2 g in sodium chloride 0.9 % 100 mL IVPB        2 g 200 mL/hr over 30 Minutes Intravenous On call to O.R. 03/11/21 2202 03/11/21 0751   03/11/21 0630  clindamycin (CLEOCIN) 900 mg, gentamicin (GARAMYCIN) 240 mg in sodium chloride 0.9 % 1,000 mL for intraperitoneal lavage  Status:  Discontinued         Irrigation To Surgery 03/11/21 0629 03/11/21 1034       Assessment/Plan  s/p Procedure(s): LAPAROSCOPIC VS OPEN COLOSTOMY REVERSAL 03/11/2021  -dc IVF -ambulate -wound packing  FEN - full liquids VTE - lovenox ID - none Disposition - await return of bowel function   LOS: 1 day   I reviewed last 24 h vitals and pain scores, last 48  h intake and output, last 24 h labs and trends, and last 24 h imaging results.  This care required moderate level of medical decision making.   [facility] 03/12/2021, 7:54 AM Please see Amion for pager number during day hours 7:00am-4:30pm or 7:00am -11:30am on weekends

## 2021-03-13 NOTE — Progress Notes (Signed)
Pharmacy Brief Note - Alvimopan (Entereg)  The standing order set for alvimopan (Entereg) now includes an automatic order to discontinue the drug after the patient has had a bowel movement. The change was approved by the [place] and the Medical Executive Committee.   This patient has had bowel movements documented by nursing. Therefore, alvimopan has been discontinued. If there are questions, please contact the pharmacy at [phone].   Thank you-   Dia Sitter, PharmD, BCPS 03/13/2021 11:00 AM

## 2021-03-13 NOTE — Plan of Care (Signed)
  Problem: Nutrition: Goal: Adequate nutrition will be maintained Outcome: Not Progressing   Problem: Pain Managment: Goal: General experience of comfort will improve Outcome: Not Progressing   

## 2021-03-13 NOTE — Progress Notes (Signed)
2 Days Post-Op   Chief Complaint/Subjective: Minimal pain.  + flatus and BM. No n/v.   Objective: Vital signs in last 24 hours: Temp:  [97.5 F (36.4 C)-98.2 F (36.8 C)] 97.5 F (36.4 C) (02/11 0507) Pulse Rate:  [69-83] 69 (02/11 0507) Resp:  [18] 18 (02/11 0507) BP: (130-134)/(78-91) 133/91 (02/11 0507) SpO2:  [97 %-98 %] 98 % (02/11 0507) Weight:  [82.1 kg] 82.1 kg (02/11 0500) Last BM Date: 03/12/21 Intake/Output from previous day: 02/10 0701 - 02/11 0700 In: 1455.5 [P.O.:600; I.V.:855.5] Out: 278 [Urine:278] Intake/Output this shift: No intake/output data recorded.  PE: Gen: NAD Resp: nonlabored Card: RRR Abd: soft, NT, non distended. Former ostomy site dressing changed.  Beefy red.    Lab Results:  Recent Labs    03/11/21 1105 03/12/21 0421  WBC 8.8 13.2*  HGB 15.9 15.0  HCT 45.6 43.2  PLT 242 264   BMET Recent Labs    03/11/21 1105 03/12/21 0421  NA  --  135  K  --  3.6  CL  --  105  CO2  --  21*  GLUCOSE  --  113*  BUN  --  13  CREATININE 0.98 0.57*  CALCIUM  --  9.0   PT/INR No results for input(s): LABPROT, INR in the last 72 hours. CMP     Component Value Date/Time   NA 135 03/12/2021 0421   K 3.6 03/12/2021 0421   CL 105 03/12/2021 0421   CO2 21 (L) 03/12/2021 0421   GLUCOSE 113 (H) 03/12/2021 0421   BUN 13 03/12/2021 0421   CREATININE 0.57 (L) 03/12/2021 0421   CALCIUM 9.0 03/12/2021 0421   PROT 5.7 (L) 09/02/2020 0433   ALBUMIN 1.6 (L) 09/02/2020 0433   AST 22 09/02/2020 0433   ALT 29 09/02/2020 0433   ALKPHOS 156 (H) 09/02/2020 0433   BILITOT 0.4 09/02/2020 0433   GFRNONAA >60 03/12/2021 0421   GFRAA SPECIMEN CONTAMINATED, UNABLE TO PERFORM TEST(S). 08/19/2020 1236   Lipase     Component Value Date/Time   LIPASE 18 08/12/2020 1010    Studies/Results: No results found.  Anti-infectives: Anti-infectives (From admission, onward)    Start     Dose/Rate Route Frequency Ordered Stop   03/11/21 1400  neomycin  (MYCIFRADIN) tablet 1,000 mg  Status:  Discontinued       See Hyperspace for full Linked Orders Report.   1,000 mg Oral 3 times per day 03/11/21 0629 03/11/21 4132   03/11/21 1400  metroNIDAZOLE (FLAGYL) tablet 1,000 mg  Status:  Discontinued       See Hyperspace for full Linked Orders Report.   1,000 mg Oral 3 times per day 03/11/21 0629 03/11/21 0632   03/11/21 0630  cefoTEtan (CEFOTAN) 2 g in sodium chloride 0.9 % 100 mL IVPB        2 g 200 mL/hr over 30 Minutes Intravenous On call to O.R. 03/11/21 4401 03/11/21 0751   03/11/21 0630  clindamycin (CLEOCIN) 900 mg, gentamicin (GARAMYCIN) 240 mg in sodium chloride 0.9 % 1,000 mL for intraperitoneal lavage  Status:  Discontinued         Irrigation To Surgery 03/11/21 0629 03/11/21 1034       Assessment/Plan  s/p Procedure(s): LAPAROSCOPIC VS OPEN COLOSTOMY REVERSAL 03/11/2021  -pulmonary toilet, add IS  FEN - advance diet to soft.  VTE - lovenox ID - none Disposition - probably tomorrow given improvement.     LOS: 2 days   I reviewed last 24  h vitals and pain scores, last 48 h intake and output, last 24 h labs and trends, and last 24 h imaging results.  This care required moderate level of medical decision making.   Milus Height, MD FACS Surgical Oncology, General Surgery, Trauma and [facility], [place] for weekday/non holidays Check [email or web address] for coverage night/weekend/holidays  Do not use SecureChat as it is not reliable for timely patient care.

## 2021-03-14 MED ORDER — ACETAMINOPHEN 500 MG PO TABS: 1000.0000 mg | ORAL_TABLET | Freq: Four times a day (QID) | ORAL | 0 refills | Status: DC

## 2021-03-14 MED ORDER — OXYCODONE HCL 5 MG PO TABS: 5.0000 mg | ORAL_TABLET | Freq: Four times a day (QID) | ORAL | 0 refills | Status: DC | PRN

## 2021-03-14 NOTE — Discharge Instructions (Addendum)
[facility], [place] [phone]  ABDOMINAL SURGERY: POST OP INSTRUCTIONS  Always review your discharge instruction sheet given to you by the facility where your surgery was performed.  IF YOU HAVE DISABILITY OR FAMILY LEAVE FORMS, YOU MUST BRING THEM TO THE OFFICE FOR PROCESSING.  PLEASE DO NOT GIVE THEM TO YOUR DOCTOR.  A prescription for pain medication may be given to you upon discharge.  Take your pain medication as prescribed, if needed.  If narcotic pain medicine is not needed, then you may take acetaminophen (Tylenol) or ibuprofen (Advil) as needed. Take your usually prescribed medications unless otherwise directed. If you need a refill on your pain medication, please contact your pharmacy. They will contact our office to request authorization.  Prescriptions will not be filled after 5pm or on week-ends. You should follow a light diet the first few days after arrival home, such as soup and crackers, pudding, etc.unless your doctor has advised otherwise. A high-fiber, low fat diet can be resumed as tolerated.   Be sure to include lots of fluids daily. Most patients will experience some swelling and bruising on the chest and neck area.  Ice packs will help.  Swelling and bruising can take several days to resolve Most patients will experience some swelling and bruising in the area of the incision. Ice pack will help. Swelling and bruising can take several days to resolve..  It is common to experience some constipation if taking pain medication after surgery.  Increasing fluid intake and taking a stool softener will usually help or prevent this problem from occurring.  A mild laxative (Milk of Magnesia or Miralax) should be taken according to package directions if there are no bowel movements after 48 hours.  You may have steri-strips (small skin tapes) in place directly over the incision.  These strips should be left on the skin for 7-10 days.  If your surgeon used skin glue on  the incision, you may shower in 24 hours.  The glue will flake off over the next 2-3 weeks.  Any sutures or staples will be removed at the office during your follow-up visit. You may find that a light gauze bandage over your incision may keep your staples from being rubbed or pulled. You may shower and replace the bandage daily. ACTIVITIES:  You may resume regular (light) daily activities beginning the next day--such as daily self-care, walking, climbing stairs--gradually increasing activities as tolerated.  You may have sexual intercourse when it is comfortable.  Refrain from any heavy lifting or straining until approved by your doctor. You may drive when you no longer are taking prescription pain medication, you can comfortably wear a seatbelt, and you can safely maneuver your car and apply brakes Return to Work: ___________________________________ Dennis Bast should see your doctor in the office for a follow-up appointment approximately two weeks after your surgery.  Make sure that you call for this appointment within a day or two after you arrive home to insure a convenient appointment time. OTHER INSTRUCTIONS:  _____________________________________________________________ _____________________________________________________________  WHEN TO CALL YOUR DOCTOR: Fever over 101.0 Inability to urinate Nausea and/or vomiting Extreme swelling or bruising Continued bleeding from incision. Increased pain, redness, or drainage from the incision. Difficulty swallowing or breathing Muscle cramping or spasms. Numbness or tingling in hands or feet or around lips.  The clinic staff is available to answer your questions during regular business hours.  Please dont hesitate to call and ask to speak to one of the  nurses if you have concerns.  For further questions, please visit [email or web address]

## 2021-03-14 NOTE — Discharge Summary (Signed)
Physician Discharge Summary  Patient ID: Roger Brennan MRN: [id number] DOB/AGE: Jun 23, 1960 61 y.o.  Admit date: 03/11/2021 Discharge date: 03/14/2021  Admission Diagnoses: Patient Active Problem List   Diagnosis Date Noted   Diverticulitis 03/11/2021   Right ankle pain 09/22/2020   Adjustment disorder with mixed anxiety and depressed mood 09/22/2020   DVT (deep venous thrombosis) ([place]) 09/22/2020   Vitamin D deficiency 09/22/2020   Acute idiopathic gout of right knee    Acute blood loss anemia    Incisional pain    Diabetes mellitus, new onset ([place])    Malnutrition of moderate degree 09/02/2020   Debility 09/01/2020   Pressure injury of skin 08/24/2020   Severe sepsis ([facility])    Perforation of colon ([place]) 08/12/2020    Discharge Diagnoses:  Principal Problem:   Diverticulitis And same as above  Discharged Condition: stable  Hospital Course:  Patient was admitted to the floor following laparoscopic end colostomy takedown by Dr. Kieth Brightly 03/11/2021.  He did well, advancing his diet quickly.  He is passing gas and having small BMs without blood.  He is taking minimal pain medication.  He is ambulatory and urinating spontaneously.  He is able to pack his own wound since he had packing for the first surgery.  He is discharged home on POD 3 in good condition.    Consults: None  Significant Diagnostic Studies: labs: none significant  Treatments: surgery: see above  Discharge Exam: Blood pressure 132/84, pulse 70, temperature 98.4 F (36.9 C), temperature source Oral, resp. rate 15, height 5\' 10"  (1.778 m), weight 82.1 kg, SpO2 96 %. General appearance: alert, cooperative, and no distress Resp: breathing comfortably GI: soft, non distended, ostomy wound just changed, but was beefy red without bleeding.  Less serous drainage than on POD 1-2.   Extremities: extremities normal, atraumatic, no cyanosis or edema  Disposition: Discharge disposition: 01-Home or Self  Care       Discharge Instructions     Call MD for:  difficulty breathing, headache or visual disturbances   Complete by: As directed    Call MD for:  hives   Complete by: As directed    Call MD for:  persistant nausea and vomiting   Complete by: As directed    Call MD for:  redness, tenderness, or signs of infection (pain, swelling, redness, odor or green/yellow discharge around incision site)   Complete by: As directed    Call MD for:  severe uncontrolled pain   Complete by: As directed    Call MD for:  temperature >100.4   Complete by: As directed    Change dressing (specify)   Complete by: As directed    Wet to dry dressing former left sided ostomy site once daily.   Diet - low sodium heart healthy   Complete by: As directed    Increase activity slowly   Complete by: As directed       Allergies as of 03/14/2021   No Known Allergies      Medication List     TAKE these medications    acetaminophen 500 MG tablet Commonly known as: TYLENOL Take 2 tablets (1,000 mg total) by mouth every 6 (six) hours.   amLODipine 5 MG tablet Commonly known as: NORVASC Take 1 tablet (5 mg total) by mouth daily.   neomycin 500 MG tablet Commonly known as: MYCIFRADIN Take 1,000 mg by mouth 3 (three) times daily.   oxyCODONE 5 MG immediate release tablet Commonly known as: Oxy IR/ROXICODONE Take  1 tablet (5 mg total) by mouth every 6 (six) hours as needed for moderate pain.               Discharge Care Instructions  (From admission, onward)           Start     Ordered   03/14/21 0000  Change dressing (specify)       Comments: Wet to dry dressing former left sided ostomy site once daily.   03/14/21 0757            Follow-up Information     Kinsinger, Arta Bruce, MD Follow up in 2 week(s).   Specialty: General Surgery Contact information: [address] [place] [address] [phone]                 Signed: Stark Klein 03/14/2021, 8:02 AM

## 2021-03-14 NOTE — Progress Notes (Signed)
Patient was given discharge instructions, and all questions were answered. Patient was stable for discharge and was walked to the main exit. 

## 2021-03-15 ENCOUNTER — Encounter: Payer: Self-pay | Admitting: Physical Medicine and Rehabilitation

## 2021-03-15 ENCOUNTER — Other Ambulatory Visit: Payer: Self-pay

## 2021-03-15 ENCOUNTER — Encounter
Payer: Managed Care, Other (non HMO) | Attending: Physical Medicine and Rehabilitation | Admitting: Physical Medicine and Rehabilitation

## 2021-03-15 VITALS — BP 155/94 | HR 84 | Temp 98.1°F | Ht 70.0 in | Wt 177.0 lb

## 2021-03-15 DIAGNOSIS — R6520 Severe sepsis without septic shock: Secondary | ICD-10-CM | POA: Diagnosis not present

## 2021-03-15 DIAGNOSIS — K631 Perforation of intestine (nontraumatic): Secondary | ICD-10-CM

## 2021-03-15 DIAGNOSIS — A419 Sepsis, unspecified organism: Secondary | ICD-10-CM | POA: Diagnosis not present

## 2021-03-15 NOTE — Patient Instructions (Addendum)
N-acetyl-cysteine 600mg  BID  HTN: -Advised checking BP daily at home and logging results to bring into follow-up appointment with her PCP and myself. -Reviewed BP meds today.  -Advised regarding healthy foods that can help lower blood pressure and provided with a list: 1) citrus foods- high in vitamins and minerals 2) salmon and other fatty fish - reduces inflammation and oxylipins 3) swiss chard (leafy green)- high level of nitrates 4) pumpkin seeds- one of the best natural sources of magnesium 5) Beans and lentils- high in fiber, magnesium, and potassium 6) Berries- high in flavonoids 7) Amaranth (whole grain, can be cooked similarly to rice and oats)- high in magnesium and fiber 8) Pistachios- even more effective at reducing BP than other nuts 9) Carrots- high in phenolic compounds that relax blood vessels and reduce inflammation 10) Celery- contain phthalides that relax tissues of arterial walls 11) Tomatoes- can also improve cholesterol and reduce risk of heart disease 12) Broccoli- good source of magnesium, calcium, and potassium 13) Greek yogurt: high in potassium and calcium 14) Herbs and spices: Celery seed, cilantro, saffron, lemongrass, black cumin, ginseng, cinnamon, cardamom, sweet basil, and ginger 15) Chia and flax seeds- also help to lower cholesterol and blood sugar 16) Beets- high levels of nitrates that relax blood vessels  17) spinach and bananas- high in potassium  -Provided lise of supplements that can help with hypertension:  1) magnesium: one high quality brand is Bioptemizers since it contains all 7 types of magnesium, otherwise over the counter magnesium gluconate 400mg  is a good option 2) B vitamins 3) vitamin D 4) potassium 5) CoQ10 6) L-arginine 7) Vitamin C 8) Beetroot -Educated that goal BP is 120/80. -Made goal to incorporate some of the above foods into diet.

## 2021-03-15 NOTE — Progress Notes (Signed)
Subjective:    Patient ID: Roger Brennan, male    DOB: 1960/04/22, 61 y.o.   MRN: [id number]  HPI  Roger Brennan is a 61 year old man who presents for hospital follow-up after CIR admission for severe abdominal infection.   Abdominal pain is much improved. Doing well with dressing changes.   Had a great procedure last week- laprascopic  Hgb A1c is 6.3 in January.   Fees post-surgical pain without opioid medication right now.   He was taking 2 tylenol and an oxycodone and this helps.   SBP elevated in office today- blood pressure has been good at home and has been good last week.   He had follow-up with surgery. He has another appointment on October 11th. Healing ahead of schedule.   Ostomy doctor said wounds are healing too fast.   Average pain is 1/10. He takes one oxycodone sparingly if pain has been severe. Has run out of muscle relaxers and Celebrex and asks if he should still be taking these.   He does require refills of his medications.   Swelling has been improved.   Seems to be impatient at times.   He asks about what medications he should continue   HgbA1c returned at 5.8  Vitamin D level returned at 64  Pain Inventory Average Pain 4 Pain Right Now 2 My pain is intermittent  In the last 24 hours, has pain interfered with the following? General activity 5 Relation with others 10 Enjoyment of life 10 What TIME of day is your pain at its worst? morning  Sleep (in general) NA  Pain is worse with: bending Pain improves with: medication and na Relief from Meds:  na      Family History  Adopted: Yes   Social History   Socioeconomic History   Marital status: Soil scientist    Spouse name: [name]   Number of children: Not on file   Years of education: Not on file   Highest education level: Not on file  Occupational History   Not on file  Tobacco Use   Smoking status: Never    Passive exposure: Never   Smokeless tobacco: Never  Vaping Use    Vaping Use: Never used  Substance and Sexual Activity   Alcohol use: Yes    Comment: occas.   Drug use: Never   Sexual activity: Yes  Other Topics Concern   Not on file  Social History Narrative   Not on file   Social Determinants of Health   Financial Resource Strain: Not on file  Food Insecurity: Not on file  Transportation Needs: Not on file  Physical Activity: Not on file  Stress: Not on file  Social Connections: Not on file   Past Surgical History:  Procedure Laterality Date   ABDOMINAL WALL DEFECT REPAIR N/A 08/15/2020   Procedure: REPAIR ABDOMINAL WALL/REEXPLORATION OF ABDOMINAL WALL, IRRIGATION AND DEBRIDEMENT;  Surgeon: Erroll Luna, MD;  Location: [facility];  Service: General;  Laterality: N/A;   COLOSTOMY REVERSAL N/A 03/11/2021   Procedure: LAPAROSCOPIC VS OPEN COLOSTOMY REVERSAL;  Surgeon: Kieth Brightly Arta Bruce, MD;  Location: [facility];  Service: General;  Laterality: N/A;   LAPAROTOMY N/A 08/14/2020   Procedure: EXPLORATORY LAPAROTOMY, REPAIR PERFORATED DIVERTICULUM;  Surgeon: Kieth Brightly Arta Bruce, MD;  Location: [facility];  Service: General;  Laterality: N/A;   PARTIAL COLECTOMY N/A 08/12/2020   Procedure: [place];  Surgeon: Mickeal Skinner, MD;  Location: [facility];  Service: General;  Laterality: N/A;  Past Medical History:  Diagnosis Date   Diverticulosis    Hypertension    BP (!) 155/94    Pulse 84    Temp 98.1 F (36.7 C)    Ht 5\' 10"  (1.778 m)    Wt 177 lb (80.3 kg)    SpO2 98%    BMI 25.40 kg/m   Opioid Risk Score:   Fall Risk Score:  `1  Depression screen PHQ 2/9  Depression screen [facility] 2/9 03/15/2021 10/23/2020  Decreased Interest 0 0  Down, Depressed, Hopeless 0 0  PHQ - 2 Score 0 0  Altered sleeping - 0  Tired, decreased energy - 1  Change in appetite - 0  Feeling bad or failure about yourself  - 0  Trouble concentrating - 0  Moving slowly or fidgety/restless - 0  Suicidal thoughts - 0  PHQ-9 Score - 1      Review of Systems  Constitutional: Negative.   HENT: Negative.    Eyes: Negative.   Respiratory: Negative.    Cardiovascular: Negative.   Gastrointestinal: Negative.   Endocrine: Negative.   Genitourinary: Negative.   Musculoskeletal:        Pain in abd area and right foot by drawing  Skin: Negative.   Allergic/Immunologic: Negative.   Neurological: Negative.   Hematological:  Bruises/bleeds easily.       Eliquis  Psychiatric/Behavioral: Negative.    All other systems reviewed and are negative.     Objective:   Physical Exam Gen: no distress, normal appearing HEENT: oral mucosa pink and moist, NCAT Cardio: Reg rate Chest: normal effort, normal rate of breathing Abd: soft, non-distended Ext: no edema Psych: pleasant, normal affect Skin: intact Neuro: Alert and oriented x3 Musculoskeletal: Walking well.      Assessment & Plan:  1) Perforation of colon -refilled zinc, vitamin C, and protein supplements to continue to promote healing -discussed return to work.  -discussed the great healing of his wound  2) Diabetes: -HgbA1c returned at 5.8- discussed that this is considered prediabetes and is much better than his level of 7.4 in the summer! 6.3 in January- can repeat next [place] on his progress! -Advised to avoid added sugar especially in drinks.  -Advised to eat protein and vegetables first when eating meals -Recommended 1 glass water with 1 TB apple cider vinegar before meals to reduce CBG spike, has additional health benefits, drink with straw to protect enamel.   -avoid sugar, bread, pasta, rice -avoid snacking -try to incorporate into your diet some of the following foods which are good for diabetes: 1) cinnamon- imitates effects of insulin, increasing glucose transport into cells ([place] or [place] cinnamon is best, least processed) 2) nuts- can slow down the blood sugar response of carbohydrate rich foods 3) oatmeal- contains and  anti-inflammatory compound avenanthramide 4) whole-milk yogurt (best types are no sugar, [place] yogurt, or goat/sheep yogurt) 5) beans- high in protein, fiber, and vitamins, low glycemic index 6) broccoli- great source of vitamin A and C 7) quinoa- higher in protein and fiber than other grains 8) spinach- high in vitamin A, fiber, and protein 9) olive oil- reduces glucose levels, LDL, and triglycerides 10) salmon- excellent amount of omega-3-fatty acids 11) walnuts- rich in antioxidants 12) apples- high in fiber and quercetin 13) carrots- highly nutritious with low impact on blood sugar 14) eggs- improve HDL (good cholesterol), high in protein, keep you satiated 15) turmeric: improves blood sugars, cardiovascular disease, and protects kidney health 16) garlic: improves blood sugar, blood  pressure, pain 17) tomatoes: highly nutritious with low impact on blood sugar   3) HTN: -BP is 149/98 -Advised checking BP daily at home and logging results to bring into follow-up appointment with her PCP and myself. -use amlodipine as needed for high pressures for now -Advised regarding healthy foods that can help lower blood pressure and provided with a list: 1) citrus foods- high in vitamins and minerals 2) salmon and other fatty fish - reduces inflammation and oxylipins 3) swiss chard (leafy green)- high level of nitrates 4) pumpkin seeds- one of the best natural sources of magnesium 5) Beans and lentils- high in fiber, magnesium, and potassium 6) Berries- high in flavonoids 7) Amaranth (whole grain, can be cooked similarly to rice and oats)- high in magnesium and fiber 8) Pistachios- even more effective at reducing BP than other nuts 9) Carrots- high in phenolic compounds that relax blood vessels and reduce inflammation 10) Celery- contain phthalides that relax tissues of arterial walls 11) Tomatoes- can also improve cholesterol and reduce risk of heart disease 12) Broccoli- good source of  magnesium, calcium, and potassium 13) Greek yogurt: high in potassium and calcium 14) Herbs and spices: Celery seed, cilantro, saffron, lemongrass, black cumin, ginseng, cinnamon, cardamom, sweet basil, and ginger 15) Chia and flax seeds- also help to lower cholesterol and blood sugar 16) Beets- high levels of nitrates that relax blood vessels  17) spinach and bananas- high in potassium  -Provided lise of supplements that can help with hypertension:  1) magnesium: one high quality brand is Bioptemizers since it contains all 7 types of magnesium, otherwise over the counter magnesium gluconate 400mg  is a good option 2) B vitamins 3) vitamin D 4) potassium 5) CoQ10 6) L-arginine 7) Vitamin C 8) Beetroot -Educated that goal BP is 120/80. -Made goal to incorporate some of the above foods into diet.    4) Impatience/difficulty coping with condition as was previously very healthy -provided referral to neuropsych  5) DVT: -completed course of Eliquis.   6) Vitamin D deficiency: -advised that Vitamin D level normalized -continue to spent time in [place].   7) Gout: -discussed no more attacks.

## 2021-03-15 NOTE — Progress Notes (Deleted)
Subjective:    Patient ID: Roger Brennan, male    DOB: [birth year], 61 y.o.   MRN: [id number]  HPI An audio/video tele-health visit is felt to be the most appropriate encounter for this patient at this time. This is a follow up tele-visit via phone. The patient is at home. MD is at office.    Roger Brennan is a 61 year old man who presents for hospital follow-up after CIR admission for severe abdominal infection.   Abdominal pain is much improved. Doing well with dressing changes.   He had follow-up with surgery. He has another appointment on October 11th. Healing ahead of schedule.   Ostomy doctor said wounds are healing too fast.   Average pain is 1/10. He takes one oxycodone sparingly if pain has been severe. Has run out of muscle relaxers and Celebrex and asks if he should still be taking these.   He does require refills of his medications.   Swelling has been improved.   Seems to be impatient at times.   He asks about what medications he should continue   HgbA1c returned at 5.8  Vitamin D level returned at 64  Pain Inventory Average Pain 4 Pain Right Now 2 My pain is intermittent  In the last 24 hours, has pain interfered with the following? General activity 5 Relation with others 10 Enjoyment of life 10 What TIME of day is your pain at its worst? morning  Sleep (in general) NA  Pain is worse with: bending Pain improves with: medication and na Relief from Meds:  na      Family History  Adopted: Yes   Social History   Socioeconomic History   Marital status: Soil scientist    Spouse name: [name]   Number of children: Not on file   Years of education: Not on file   Highest education level: Not on file  Occupational History   Not on file  Tobacco Use   Smoking status: Never    Passive exposure: Never   Smokeless tobacco: Never  Vaping Use   Vaping Use: Never used  Substance and Sexual Activity   Alcohol use: Yes    Comment: occas.   Drug  use: Never   Sexual activity: Yes  Other Topics Concern   Not on file  Social History Narrative   Not on file   Social Determinants of Health   Financial Resource Strain: Not on file  Food Insecurity: Not on file  Transportation Needs: Not on file  Physical Activity: Not on file  Stress: Not on file  Social Connections: Not on file   Past Surgical History:  Procedure Laterality Date   ABDOMINAL WALL DEFECT REPAIR N/A 08/15/2020   Procedure: REPAIR ABDOMINAL WALL/REEXPLORATION OF ABDOMINAL WALL, IRRIGATION AND DEBRIDEMENT;  Surgeon: Erroll Luna, MD;  Location: [facility];  Service: General;  Laterality: N/A;   COLOSTOMY REVERSAL N/A 03/11/2021   Procedure: LAPAROSCOPIC VS OPEN COLOSTOMY REVERSAL;  Surgeon: Kieth Brightly Arta Bruce, MD;  Location: [facility];  Service: General;  Laterality: N/A;   LAPAROTOMY N/A 08/14/2020   Procedure: EXPLORATORY LAPAROTOMY, REPAIR PERFORATED DIVERTICULUM;  Surgeon: Kieth Brightly, Arta Bruce, MD;  Location: [facility];  Service: General;  Laterality: N/A;   PARTIAL COLECTOMY N/A 08/12/2020   Procedure: [place];  Surgeon: Mickeal Skinner, MD;  Location: [facility];  Service: General;  Laterality: N/A;   Past Medical History:  Diagnosis Date   Diverticulosis    Hypertension    BP (!) 155/94  Pulse 84    Temp 98.1 F (36.7 C)    Ht 5\' 10"  (1.778 m)    Wt 177 lb (80.3 kg)    SpO2 98%    BMI 25.40 kg/m   Opioid Risk Score:   Fall Risk Score:  `1  Depression screen PHQ 2/9  Depression screen PHQ 2/9 10/23/2020  Decreased Interest 0  Down, Depressed, Hopeless 0  PHQ - 2 Score 0  Altered sleeping 0  Tired, decreased energy 1  Change in appetite 0  Feeling bad or failure about yourself  0  Trouble concentrating 0  Moving slowly or fidgety/restless 0  Suicidal thoughts 0  PHQ-9 Score 1     Review of Systems  Constitutional: Negative.   HENT: Negative.    Eyes: Negative.   Respiratory: Negative.    Cardiovascular:  Negative.   Gastrointestinal: Negative.   Endocrine: Negative.   Genitourinary: Negative.   Musculoskeletal:        Pain in abd area and right foot by drawing  Skin: Negative.   Allergic/Immunologic: Negative.   Neurological: Negative.   Hematological:  Bruises/bleeds easily.       Eliquis  Psychiatric/Behavioral: Negative.    All other systems reviewed and are negative.     Objective:   Physical Exam Not performed as patient was seen via phone     Assessment & Plan:  1) Perforation of colon -refilled zinc, vitamin C, and protein supplements to continue to promote healing -wound healing well -discussed can finish his current juven and then restart his forever supplement.   2) Diabetes: -HgbA1c rincreased slightly to 6.3, repeat next visit.  -Advised to avoid added sugar especially in drinks.  -Advised to eat protein and vegetables first when eating meals -Recommended 1 glass water with 1 TB apple cider vinegar before meals to reduce CBG spike, has additional health benefits, drink with straw to protect enamel.   -avoid sugar, bread, pasta, rice -avoid snacking -try to incorporate into your diet some of the following foods which are good for diabetes: 1) cinnamon- imitates effects of insulin, increasing glucose transport into cells ([place] or [place] cinnamon is best, least processed) 2) nuts- can slow down the blood sugar response of carbohydrate rich foods 3) oatmeal- contains and anti-inflammatory compound avenanthramide 4) whole-milk yogurt (best types are no sugar, [place] yogurt, or goat/sheep yogurt) 5) beans- high in protein, fiber, and vitamins, low glycemic index 6) broccoli- great source of vitamin A and C 7) quinoa- higher in protein and fiber than other grains 8) spinach- high in vitamin A, fiber, and protein 9) olive oil- reduces glucose levels, LDL, and triglycerides 10) salmon- excellent amount of omega-3-fatty acids 11) walnuts- rich in antioxidants 12)  apples- high in fiber and quercetin 13) carrots- highly nutritious with low impact on blood sugar 14) eggs- improve HDL (good cholesterol), high in protein, keep you satiated 15) turmeric: improves blood sugars, cardiovascular disease, and protects kidney health 16) garlic: improves blood sugar, blood pressure, pain 17) tomatoes: highly nutritious with low impact on blood sugar   3) HTN: -BP is 138/89 prior visit -Advised checking BP daily at home and logging results to bring into follow-up appointment with her PCP and myself. -Off all BP meds currently -advised does not need Lisinopril -Advised regarding healthy foods that can help lower blood pressure and provided with a list: 1) citrus foods- high in vitamins and minerals 2) salmon and other fatty fish - reduces inflammation and oxylipins 3) swiss chard (leafy green)-  high level of nitrates 4) pumpkin seeds- one of the best natural sources of magnesium 5) Beans and lentils- high in fiber, magnesium, and potassium 6) Berries- high in flavonoids 7) Amaranth (whole grain, can be cooked similarly to rice and oats)- high in magnesium and fiber 8) Pistachios- even more effective at reducing BP than other nuts 9) Carrots- high in phenolic compounds that relax blood vessels and reduce inflammation 10) Celery- contain phthalides that relax tissues of arterial walls 11) Tomatoes- can also improve cholesterol and reduce risk of heart disease 12) Broccoli- good source of magnesium, calcium, and potassium 13) Greek yogurt: high in potassium and calcium 14) Herbs and spices: Celery seed, cilantro, saffron, lemongrass, black cumin, ginseng, cinnamon, cardamom, sweet basil, and ginger 15) Chia and flax seeds- also help to lower cholesterol and blood sugar 16) Beets- high levels of nitrates that relax blood vessels  17) spinach and bananas- high in potassium  -Provided lise of supplements that can help with hypertension:  1) magnesium: one high  quality brand is Bioptemizers since it contains all 7 types of magnesium, otherwise over the counter magnesium gluconate 400mg  is a good option 2) B vitamins 3) vitamin D 4) potassium 5) CoQ10 6) L-arginine 7) Vitamin C 8) Beetroot -Educated that goal BP is 120/80. -Made goal to incorporate some of the above foods into diet.    4) Impatience/difficulty coping with condition as was previously very healthy -provided referral to neuropsych  5) DVT: -refilled Eliquis for 1 more month  6) Vitamin D deficiency: -advised that Vitamin D level normalized -continue to spent time in [place].

## 2021-04-22 ENCOUNTER — Ambulatory Visit: Payer: Managed Care, Other (non HMO) | Admitting: Physical Medicine and Rehabilitation

## 2021-04-29 ENCOUNTER — Ambulatory Visit: Payer: Managed Care, Other (non HMO) | Admitting: Psychology

## 2021-05-11 ENCOUNTER — Other Ambulatory Visit: Payer: Self-pay | Admitting: Physical Medicine and Rehabilitation

## 2021-05-11 MED ORDER — MECLIZINE HCL 12.5 MG PO TABS: 12.5000 mg | ORAL_TABLET | Freq: Three times a day (TID) | ORAL | 0 refills | Status: DC | PRN

## 2021-07-07 ENCOUNTER — Other Ambulatory Visit: Payer: Self-pay | Admitting: Physical Medicine and Rehabilitation

## 2021-07-07 DIAGNOSIS — H8110 Benign paroxysmal vertigo, unspecified ear: Secondary | ICD-10-CM

## 2021-08-06 ENCOUNTER — Encounter (HOSPITAL_COMMUNITY): Payer: Self-pay | Admitting: Nurse Practitioner

## 2021-08-13 ENCOUNTER — Encounter (HOSPITAL_COMMUNITY): Payer: Self-pay | Admitting: Nurse Practitioner

## 2021-08-19 ENCOUNTER — Other Ambulatory Visit: Payer: Self-pay | Admitting: Physical Medicine and Rehabilitation

## 2021-08-19 DIAGNOSIS — H8110 Benign paroxysmal vertigo, unspecified ear: Secondary | ICD-10-CM

## 2021-08-23 ENCOUNTER — Ambulatory Visit: Payer: Managed Care, Other (non HMO) | Attending: Physical Medicine and Rehabilitation

## 2021-08-23 DIAGNOSIS — H8110 Benign paroxysmal vertigo, unspecified ear: Secondary | ICD-10-CM | POA: Diagnosis not present

## 2021-08-23 DIAGNOSIS — R42 Dizziness and giddiness: Secondary | ICD-10-CM | POA: Diagnosis not present

## 2021-08-23 NOTE — Patient Instructions (Signed)
Gaze Stabilization: Sitting    Keeping eyes on target on wall 3-4 feet away, tilt head down 15-30 and move head side to side for 60 seconds. Repeat while moving head up and down for 60 seconds. Do 2 sessions per day.    Gaze Stabilization: Tip Card 1.Target must remain in focus, not blurry, and appear stationary while head is in motion. 2.Perform exercises with small head movements (45 to either side of midline). 3.Increase speed of head motion so long as target is in focus. 4.If you wear eyeglasses, be sure you can see target through lens (therapist will give specific instructions for bifocal / progressive lenses). 5.These exercises may provoke dizziness or nausea. Work through these symptoms. If too dizzy, slow head movement slightly. Rest between each exercise. 6.Exercises demand concentration; avoid distractions. 7.For safety, perform standing exercises close to a counter, wall, corner, or next to someone.  Copyright  VHI. All rights reserved.

## 2021-08-23 NOTE — Therapy (Signed)
OUTPATIENT PHYSICAL THERAPY VESTIBULAR EVALUATION     Patient Name: Roger Brennan MRN: [id number] DOB:[birth year], 61 y.o., male Today's Date: 08/23/2021  PCP: [name], MD REFERRING PROVIDER: Izora Ribas, MD   PT End of Session - 08/23/21 1447     Visit Number 1    Number of Visits 1    Date for PT Re-Evaluation 08/24/21    Authorization Type [employer]    Authorization Time Period VL: 20    PT Start Time 1446    PT Stop Time 1524    PT Time Calculation (min) 38 min    Activity Tolerance Patient tolerated treatment well    Behavior During Therapy WFL for tasks assessed/performed             Past Medical History:  Diagnosis Date   Diverticulosis    Hypertension    Past Surgical History:  Procedure Laterality Date   ABDOMINAL WALL DEFECT REPAIR N/A 08/15/2020   Procedure: REPAIR ABDOMINAL WALL/REEXPLORATION OF ABDOMINAL WALL, IRRIGATION AND DEBRIDEMENT;  Surgeon: Erroll Luna, MD;  Location: [facility];  Service: General;  Laterality: N/A;   COLOSTOMY REVERSAL N/A 03/11/2021   Procedure: LAPAROSCOPIC VS OPEN COLOSTOMY REVERSAL;  Surgeon: Mickeal Skinner, MD;  Location: [facility];  Service: General;  Laterality: N/A;   LAPAROTOMY N/A 08/14/2020   Procedure: EXPLORATORY LAPAROTOMY, REPAIR PERFORATED DIVERTICULUM;  Surgeon: Kieth Brightly, Arta Bruce, MD;  Location: [facility];  Service: General;  Laterality: N/A;   PARTIAL COLECTOMY N/A 08/12/2020   Procedure: EXPLORATORY LAPAROTOMY,PARTIAL COLECTOMY;  Surgeon: Mickeal Skinner, MD;  Location: [facility];  Service: General;  Laterality: N/A;   Patient Active Problem List   Diagnosis Date Noted   Diverticulitis 03/11/2021   Right ankle pain 09/22/2020   Adjustment disorder with mixed anxiety and depressed mood 09/22/2020   DVT (deep venous thrombosis) ([place]) 09/22/2020   Vitamin D deficiency 09/22/2020   Acute idiopathic gout of right knee    Acute blood loss anemia    Incisional pain    Diabetes mellitus, new onset  ([place])    Malnutrition of moderate degree 09/02/2020   Debility 09/01/2020   Pressure injury of skin 08/24/2020   Severe sepsis ([place])    Perforation of colon ([place]) 08/12/2020    ONSET DATE: 08/19/21  REFERRING DIAG: H81.10 (ICD-10-CM) - Benign paroxysmal positional vertigo, unspecified laterality  THERAPY DIAG:  Dizziness and giddiness  Rationale for Evaluation and Treatment Rehabilitation  SUBJECTIVE:   SUBJECTIVE STATEMENT: Patient reports when vertigo started it was when 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 Reports that felt like he had water in his head.  Reports later that day it hit him again with nausea/vomiting. Has had lingering symptoms since then. No hearing or vision changes. Feels pretty comfortable. 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 No flare ups, constant sensation. Reports he played some Pickleball, but did not quite feel right. States he is not 100%.  Pt accompanied by: self  PERTINENT HISTORY: HTN, Diverticulitis, Diabetes   PAIN:  Are you having pain? No  PRECAUTIONS: Other: HTN, Diverticulitis, Diabetes  WEIGHT BEARING RESTRICTIONS No  FALLS: Has patient fallen in last 6 months? No  PLOF: Vocation/Vocational requirements: coaches tennis  PATIENT GOALS Improve Symptoms  OBJECTIVE:   DIAGNOSTIC FINDINGS: None  COGNITION: Overall cognitive status: Within functional limits for tasks assessed   SENSATION: WFL  POSTURE: No Significant postural limitations  STRENGTH: WFL  TRANSFERS: Assistive device utilized: None  Sit to stand: Complete Independence Stand to sit:  Complete Independence  GAIT: Gait pattern: WFL Distance walked: clinic distance Assistive device utilized: None Level of assistance: Complete Independence Comments: normal gait; no imbalance noted  PATIENT SURVEYS:  Staff Did Not Capture   VESTIBULAR ASSESSMENT     SYMPTOM BEHAVIOR:   Subjective history: See Subjective   Non-Vestibular symptoms:  nausea/vomiting   Type of dizziness: Blurred Vision and Spinning/Vertigo   Frequency: Constant; Daily   Duration: Constant   Aggravating factors:  constant   Relieving factors: rest and slow movements   Progression of symptoms: better   OCULOMOTOR EXAM:   Ocular Alignment: normal   Ocular ROM: No Limitations   Spontaneous Nystagmus: absent   Gaze-Induced Nystagmus: absent   Smooth Pursuits: intact   Saccades: intact   Convergence/Divergence: < 5 cm    VESTIBULAR - OCULAR REFLEX:    Slow VOR: Normal; No Dizziness   VOR Cancellation: Normal   Head-Impulse Test: HIT Right: positive HIT Left: negative   Dynamic Visual Acuity: Static: 7 Dynamic: 5; No Dizziness    POSITIONAL TESTING: Right Dix-Hallpike: no nystagmus Left Dix-Hallpike: no nystagmus Right Roll Test: no nystagmus Left Roll Test: no nystagmus  MOTION SENSITIVITY:    Motion Sensitivity Quotient  Intensity: 0 = none, 1 = Lightheaded, 2 = Mild, 3 = Moderate, 4 = Severe, 5 = Vomiting  Intensity  1. Sitting to supine 0  2. Supine to L side 0  3. Supine to R side 0  4. Supine to sitting 0  5. L Hallpike-Dix 0  6. Up from L  0  7. R Hallpike-Dix 0  8. Up from R  0  9. Sitting, head  tipped to L knee   10. Head up from L  knee   11. Sitting, head  tipped to R knee   12. Head up from R  knee   13. Sitting head turns x5   14.Sitting head nods x5   15. In stance, 180  turn to L    16. In stance, 180  turn to R      VESTIBULAR TREATMENT: Gaze Adaptation:    x1 Viewing Horizontal: Position: seated, Time: 30 seconds, and Reps: 1 and x1 Viewing Vertical:  Position: seated, Time: 30 seconds, Reps: 1, and Comment: educated on progression of time; purpose of exercise  Provided HEP:  Gaze Stabilization: Sitting    Keeping eyes on target on wall 3-4 feet away, tilt head down 15-30 and move head side to side for 60 seconds. Repeat while moving head up and down for 60 seconds. Do 2 sessions per  day.    Gaze Stabilization: Tip Card 1.Target must remain in focus, not blurry, and appear stationary while head is in motion. 2.Perform exercises with small head movements (45 to either side of midline). 3.Increase speed of head motion so long as target is in focus. 4.If you wear eyeglasses, be sure you can see target through lens (therapist will give specific instructions for bifocal / progressive lenses). 5.These exercises may provoke dizziness or nausea. Work through these symptoms. If too dizzy, slow head movement slightly. Rest between each exercise. 6.Exercises demand concentration; avoid distractions. 7.For safety, perform standing exercises close to a counter, wall, corner, or next to someone.   PATIENT EDUCATION: Education details: no PT warranted at this time; no active BPPV; VOR to address positive HIT on R side. Further ENT Assessment Person educated: Patient Education method: Explanation Education comprehension: verbalized understanding   ASSESSMENT:  CLINICAL IMPRESSION: Patient is a 61 y.o.  male referred to Neuro OPPT services for BPPV/Vertigo. Patient's PMH significant for the following: HTN, Diverticulitis, Diabetes . Upon evaluation, patient presents with the following impairments: positive HIT on R and mild residual dizziness. Patient able to self manage VOR with home HEP. Negative positional testing. Will benefit from further ENT assessment. No skilled PT services warranted at this time.     OBJECTIVE IMPAIRMENTS dizziness.   ACTIVITY LIMITATIONS locomotion level  PARTICIPATION LIMITATIONS: occupation  PERSONAL FACTORS 1-2 comorbidities:  HTN, Diverticulitis, Diabetes  are also affecting patient's functional outcome.   CLINICAL DECISION MAKING: Stable/uncomplicated  EVALUATION COMPLEXITY: Low   PLAN: PT FREQUENCY: one time visit  PLANNED INTERVENTIONS: Therapeutic exercises, Therapeutic activity, Neuromuscular re-education, Balance training, Gait  training, Patient/Family education, Self Care, and Joint mobilization  PLAN FOR NEXT SESSION: Reassess if returns to PT after ENT visit   Kirtland Bouchard, PT, DPT 08/23/2021, 3:23 PM

## 2021-08-24 ENCOUNTER — Encounter (HOSPITAL_COMMUNITY): Payer: Self-pay | Admitting: Nurse Practitioner

## 2021-09-13 ENCOUNTER — Encounter
Payer: Managed Care, Other (non HMO) | Attending: Physical Medicine and Rehabilitation | Admitting: Physical Medicine and Rehabilitation

## 2021-11-09 ENCOUNTER — Encounter
Payer: Managed Care, Other (non HMO) | Attending: Physical Medicine and Rehabilitation | Admitting: Physical Medicine and Rehabilitation

## 2021-11-09 ENCOUNTER — Encounter: Payer: Self-pay | Admitting: Physical Medicine and Rehabilitation

## 2021-11-09 VITALS — BP 144/93 | HR 84 | Ht 70.0 in | Wt 186.0 lb

## 2021-11-09 DIAGNOSIS — I10 Essential (primary) hypertension: Secondary | ICD-10-CM | POA: Insufficient documentation

## 2021-11-09 DIAGNOSIS — H8110 Benign paroxysmal vertigo, unspecified ear: Secondary | ICD-10-CM | POA: Diagnosis present

## 2021-11-09 DIAGNOSIS — R7303 Prediabetes: Secondary | ICD-10-CM | POA: Diagnosis present

## 2021-11-09 NOTE — Addendum Note (Signed)
Addended by: Izora Ribas on: 11/09/2021 02:51 PM   Modules accepted: Level of Service

## 2021-11-09 NOTE — Progress Notes (Signed)
Subjective:    Patient ID: Roger Brennan, male    DOB: [birth year], 61 y.o.   MRN: [id number]  HPI  Roger Brennan is a 61 year old man who presents for hospital f/u after CIR admission for severe abdominal infection.   Vertigo much improved  Abdominal pain resolved. Well healed.   BP has been elevated  Debra needed the same surgery. This has been stressful.  Hgb A1c improved to 6.0   No more gout flares.   He has been working without issue.   SBP elevated in office today- blood pressure has been good at home and has been good last week.   He had follow-up with surgery. He has another appointment on October 11th. Healing ahead of schedule.   Ostomy doctor said wounds are healing too fast.   Average pain is 1/10. He takes one oxycodone sparingly if pain has been severe. Has run out of muscle relaxers and Celebrex and asks if he should still be taking these.   He does require refills of his medications.   Swelling has been improved.   Seems to be impatient at times.   He asks about what medications he should continue   HgbA1c returned at 5.8  Vitamin D level returned at 64  Pain Inventory Average Pain 0 Pain Right Now 0 My pain is in no pain  In the last 24 hours, has pain interfered with the following? General activity 0 Relation with others 0 Enjoyment of life 0 What TIME of day is your pain at its worst? No pain Sleep (in general) Good       Family History  Adopted: Yes   Social History   Socioeconomic History   Marital status: Soil scientist    Spouse name: [name]   Number of children: Not on file   Years of education: Not on file   Highest education level: Not on file  Occupational History   Not on file  Tobacco Use   Smoking status: Never    Passive exposure: Never   Smokeless tobacco: Never  Vaping Use   Vaping Use: Never used  Substance and Sexual Activity   Alcohol use: Yes    Comment: occas.   Drug use: Never   Sexual  activity: Yes  Other Topics Concern   Not on file  Social History Narrative   Not on file   Social Determinants of Health   Financial Resource Strain: Not on file  Food Insecurity: Not on file  Transportation Needs: Not on file  Physical Activity: Not on file  Stress: Not on file  Social Connections: Not on file   Past Surgical History:  Procedure Laterality Date   ABDOMINAL WALL DEFECT REPAIR N/A 08/15/2020   Procedure: REPAIR ABDOMINAL WALL/REEXPLORATION OF ABDOMINAL WALL, IRRIGATION AND DEBRIDEMENT;  Surgeon: Erroll Luna, MD;  Location: [facility];  Service: General;  Laterality: N/A;   COLOSTOMY REVERSAL N/A 03/11/2021   Procedure: LAPAROSCOPIC VS OPEN COLOSTOMY REVERSAL;  Surgeon: Kieth Brightly Arta Bruce, MD;  Location: [facility];  Service: General;  Laterality: N/A;   LAPAROTOMY N/A 08/14/2020   Procedure: EXPLORATORY LAPAROTOMY, REPAIR PERFORATED DIVERTICULUM;  Surgeon: Kieth Brightly, Arta Bruce, MD;  Location: [facility];  Service: General;  Laterality: N/A;   PARTIAL COLECTOMY N/A 08/12/2020   Procedure: [place];  Surgeon: Mickeal Skinner, MD;  Location: [facility];  Service: General;  Laterality: N/A;   Past Medical History:  Diagnosis Date   Diverticulosis    Hypertension  There were no vitals taken for this visit.  Opioid Risk Score:   Fall Risk Score:  `1  Depression screen [facility] 2/9     03/15/2021    9:58 AM 10/23/2020   10:31 AM  Depression screen [facility] 2/9  Decreased Interest 0 0  Down, Depressed, Hopeless 0 0  PHQ - 2 Score 0 0  Altered sleeping  0  Tired, decreased energy  1  Change in appetite  0  Feeling bad or failure about yourself   0  Trouble concentrating  0  Moving slowly or fidgety/restless  0  Suicidal thoughts  0  PHQ-9 Score  1     Review of Systems  Constitutional: Negative.   HENT: Negative.    Eyes: Negative.   Respiratory: Negative.    Cardiovascular: Negative.   Gastrointestinal: Negative.   Endocrine:  Negative.   Genitourinary: Negative.   Musculoskeletal:        Pain in abd area and right foot by drawing  Skin: Negative.   Allergic/Immunologic: Negative.   Neurological: Negative.   Hematological:  Bruises/bleeds easily.       Eliquis  Psychiatric/Behavioral: Negative.    All other systems reviewed and are negative.     Objective:   Physical Exam Gen: no distress, normal appearing HEENT: oral mucosa pink and moist, NCAT Cardio: Reg rate Chest: normal effort, normal rate of breathing Abd: soft, non-distended Ext: no edema Psych: pleasant, normal affect Skin: intact Neuro: Alert and oriented x3 Musculoskeletal: Walking well.      Assessment & Plan:  1) Perforation of colon -refilled zinc, vitamin C, and protein supplements to continue to promote healing -discussed return to work.  -discussed that wound has healed  2) Prediabetes: -discussed that hgb1Ac is 6 -Commended on his progress! -Advised to avoid added sugar especially in drinks.  -Advised to eat protein and vegetables first when eating meals -Recommended 1 glass water with 1 TB apple cider vinegar before meals to reduce CBG spike, has additional health benefits, drink with straw to protect enamel.   -avoid sugar, bread, pasta, rice -avoid snacking -try to incorporate into your diet some of the following foods which are good for diabetes: 1) cinnamon- imitates effects of insulin, increasing glucose transport into cells ([place] or [place] cinnamon is best, least processed) 2) nuts- can slow down the blood sugar response of carbohydrate rich foods 3) oatmeal- contains and anti-inflammatory compound avenanthramide 4) whole-milk yogurt (best types are no sugar, [place] yogurt, or goat/sheep yogurt) 5) beans- high in protein, fiber, and vitamins, low glycemic index 6) broccoli- great source of vitamin A and C 7) quinoa- higher in protein and fiber than other grains 8) spinach- high in vitamin A, fiber, and  protein 9) olive oil- reduces glucose levels, LDL, and triglycerides 10) salmon- excellent amount of omega-3-fatty acids 11) walnuts- rich in antioxidants 12) apples- high in fiber and quercetin 13) carrots- highly nutritious with low impact on blood sugar 14) eggs- improve HDL (good cholesterol), high in protein, keep you satiated 15) turmeric: improves blood sugars, cardiovascular disease, and protects kidney health 16) garlic: improves blood sugar, blood pressure, pain 17) tomatoes: highly nutritious with low impact on blood sugar   3) HTN: -Advised checking BP daily at home and logging results to bring into follow-up appointment with PCP and myself. -Reviewed BP meds today.  -Advised regarding healthy foods that can help lower blood pressure and provided with a list: 1) citrus foods- high in vitamins and minerals 2) salmon and  other fatty fish - reduces inflammation and oxylipins 3) swiss chard (leafy green)- high level of nitrates 4) pumpkin seeds- one of the best natural sources of magnesium 5) Beans and lentils- high in fiber, magnesium, and potassium 6) Berries- high in flavonoids 7) Amaranth (whole grain, can be cooked similarly to rice and oats)- high in magnesium and fiber 8) Pistachios- even more effective at reducing BP than other nuts 9) Carrots- high in phenolic compounds that relax blood vessels and reduce inflammation 10) Celery- contain phthalides that relax tissues of arterial walls 11) Tomatoes- can also improve cholesterol and reduce risk of heart disease 12) Broccoli- good source of magnesium, calcium, and potassium 13) Greek yogurt: high in potassium and calcium 14) Herbs and spices: Celery seed, cilantro, saffron, lemongrass, black cumin, ginseng, cinnamon, cardamom, sweet basil, and ginger 15) Chia and flax seeds- also help to lower cholesterol and blood sugar 16) Beets- high levels of nitrates that relax blood vessels  17) spinach and bananas- high in  potassium  -Provided lise of supplements that can help with hypertension:  1) magnesium: one high quality brand is Bioptemizers since it contains all 7 types of magnesium, otherwise over the counter magnesium gluconate '400mg'$  is a good option 2) B vitamins 3) vitamin D 4) potassium 5) CoQ10 6) L-arginine 7) Vitamin C 8) Beetroot -Educated that goal BP is 120/80. -Made goal to incorporate some of the above foods into diet.    4) Impatience/difficulty coping with condition as was previously very healthy -provided referral to neuropsych  5) DVT: -completed course of Eliquis.   6) Vitamin D deficiency: -advised that Vitamin D level normalized -continue to spent time in son.   7) Gout: -discussed no more attacks.

## 2021-11-09 NOTE — Patient Instructions (Signed)
HTN: -Advised checking BP daily at home and logging results to bring into follow-up appointment with PCP and myself. -Reviewed BP meds today.  -Advised regarding healthy foods that can help lower blood pressure and provided with a list: 1) citrus foods- high in vitamins and minerals 2) salmon and other fatty fish - reduces inflammation and oxylipins 3) swiss chard (leafy green)- high level of nitrates 4) pumpkin seeds- one of the best natural sources of magnesium 5) Beans and lentils- high in fiber, magnesium, and potassium 6) Berries- high in flavonoids 7) Amaranth (whole grain, can be cooked similarly to rice and oats)- high in magnesium and fiber 8) Pistachios- even more effective at reducing BP than other nuts 9) Carrots- high in phenolic compounds that relax blood vessels and reduce inflammation 10) Celery- contain phthalides that relax tissues of arterial walls 11) Tomatoes- can also improve cholesterol and reduce risk of heart disease 12) Broccoli- good source of magnesium, calcium, and potassium 13) Greek yogurt: high in potassium and calcium 14) Herbs and spices: Celery seed, cilantro, saffron, lemongrass, black cumin, ginseng, cinnamon, cardamom, sweet basil, and ginger 15) Chia and flax seeds- also help to lower cholesterol and blood sugar 16) Beets- high levels of nitrates that relax blood vessels  17) spinach and bananas- high in potassium  -Provided lise of supplements that can help with hypertension:  1) magnesium: one high quality brand is Bioptemizers since it contains all 7 types of magnesium, otherwise over the counter magnesium gluconate '400mg'$  is a good option 2) B vitamins 3) vitamin D 4) potassium 5) CoQ10 6) L-arginine 7) Vitamin C 8) Beetroot -Educated that goal BP is 120/80. -Made goal to incorporate some of the above foods into diet.      Prediabetes: -try to incorporate into your diet some of the following foods which are good for diabetes: 1)  cinnamon- imitates effects of insulin, increasing glucose transport into cells ([place] or [place] cinnamon is best, least processed) 2) nuts- can slow down the blood sugar response of carbohydrate rich foods 3) oatmeal- contains and anti-inflammatory compound avenanthramide 4) whole-milk yogurt (best types are no sugar, [place] yogurt, or goat/sheep yogurt) 5) beans- high in protein, fiber, and vitamins, low glycemic index 6) broccoli- great source of vitamin A and C 7) quinoa- higher in protein and fiber than other grains 8) spinach- high in vitamin A, fiber, and protein 9) olive oil- reduces glucose levels, LDL, and triglycerides 10) salmon- excellent amount of omega-3-fatty acids 11) walnuts- rich in antioxidants 12) apples- high in fiber and quercetin 13) carrots- highly nutritious with low impact on blood sugar 14) eggs- improve HDL (good cholesterol), high in protein, keep you satiated 15) turmeric: improves blood sugars, cardiovascular disease, and protects kidney health 16) garlic: improves blood sugar, blood pressure, pain 17) tomatoes: highly nutritious with low impact on blood sugar

## 2022-03-18 ENCOUNTER — Other Ambulatory Visit: Payer: Self-pay | Admitting: Physical Medicine and Rehabilitation

## 2022-03-20 ENCOUNTER — Other Ambulatory Visit: Payer: Self-pay | Admitting: Physical Medicine and Rehabilitation

## 2022-03-22 ENCOUNTER — Other Ambulatory Visit: Payer: Self-pay | Admitting: Physical Medicine and Rehabilitation

## 2022-03-22 MED ORDER — AMLODIPINE BESYLATE 5 MG PO TABS: 5.0000 mg | ORAL_TABLET | Freq: Every day | ORAL | 3 refills | Status: DC

## 2023-02-25 ENCOUNTER — Other Ambulatory Visit: Payer: Self-pay | Admitting: Physical Medicine and Rehabilitation

## 2023-02-25 MED ORDER — AMLODIPINE BESYLATE 5 MG PO TABS: 5.0000 mg | ORAL_TABLET | Freq: Every day | ORAL | 3 refills | Status: DC

## 2023-04-11 ENCOUNTER — Other Ambulatory Visit: Payer: Self-pay | Admitting: Physical Medicine and Rehabilitation

## 2023-04-29 IMAGING — DX DG CHEST 1V PORT
1 series · 1 of 1 positions shown · non-contrast
Comparison: None.

CLINICAL DATA: Questionable sepsis.  Left abdominal pain.

EXAM:
PORTABLE CHEST 1 VIEW

[chest ap]
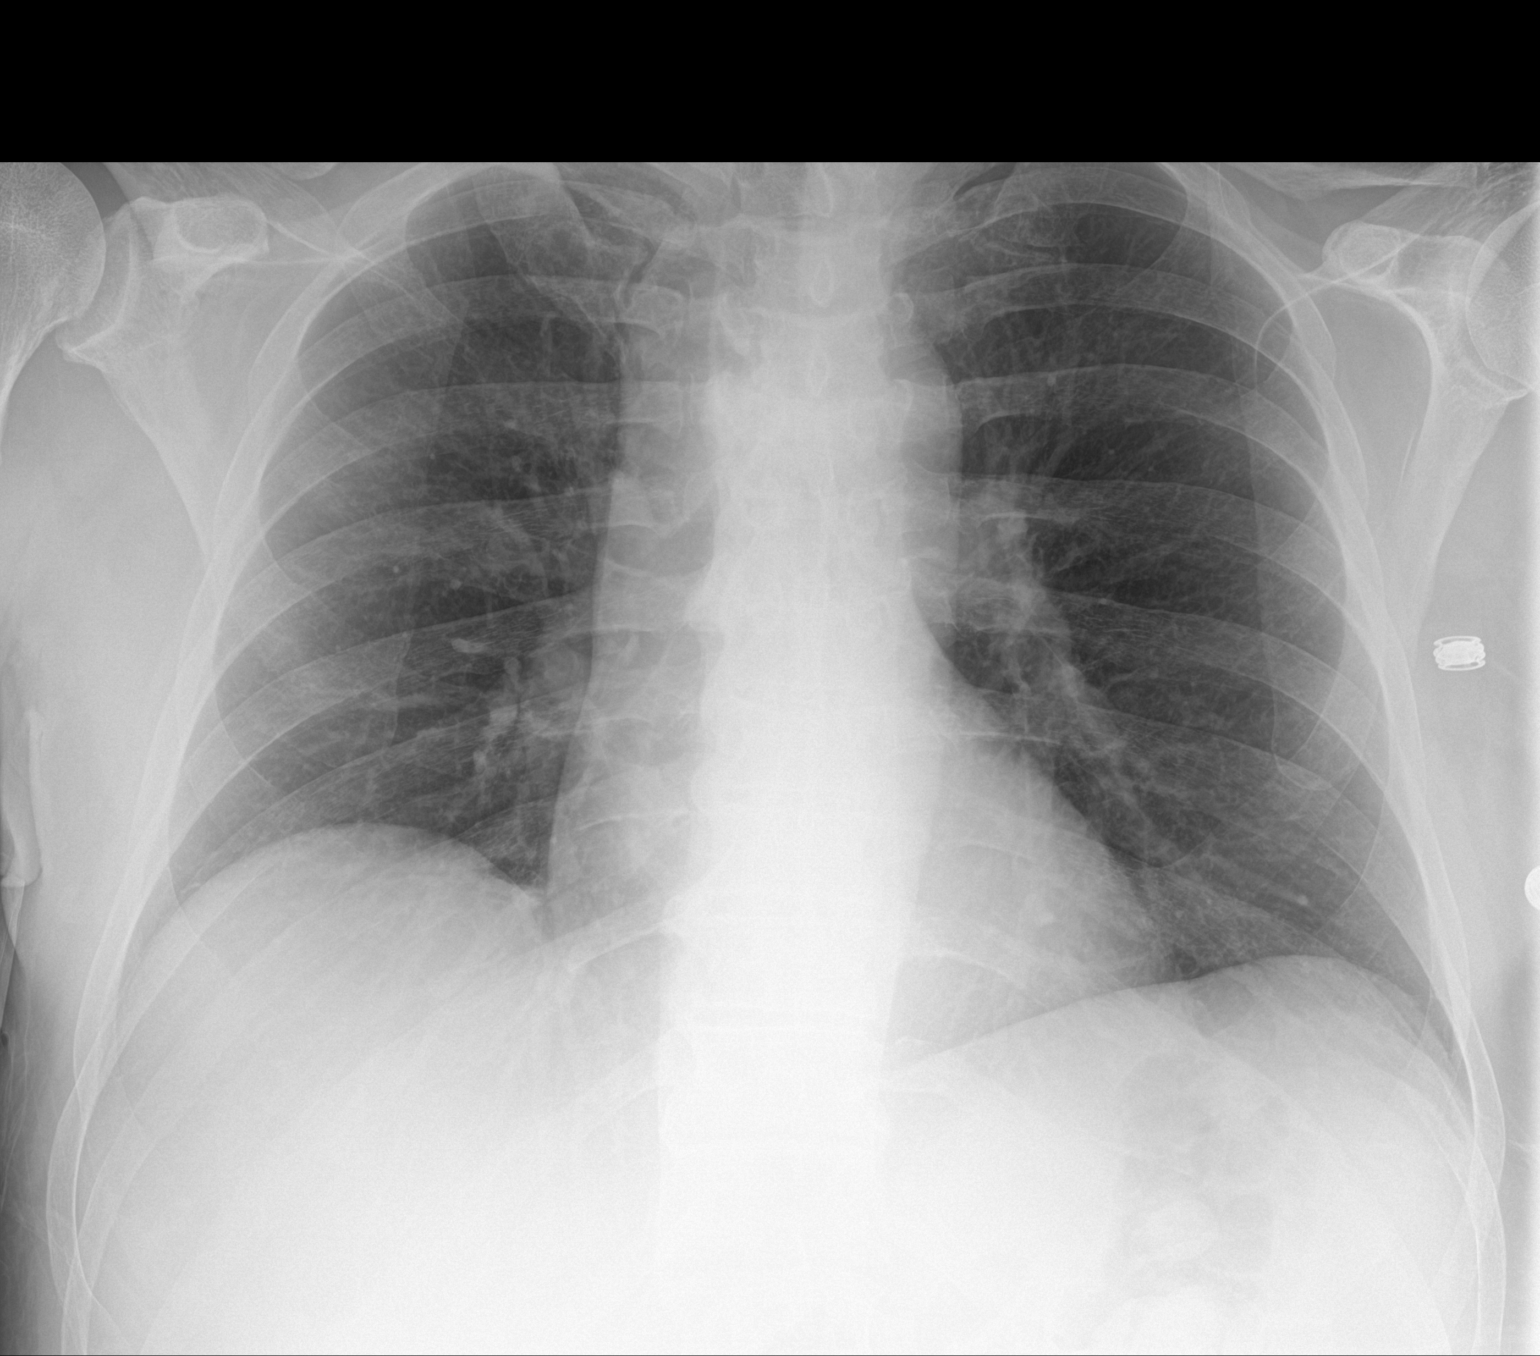

[1 of 1 positions shown; findings below may reference images not displayed]

FINDINGS: Heart and mediastinal contours are within normal limits. No focal
opacities or effusions. No acute bony abnormality.
IMPRESSION: No active disease.

## 2023-06-08 IMAGING — CR DG ANKLE COMPLETE 3+V*R*
3 series · 3 of 3 positions shown · non-contrast
Comparison: None.

CLINICAL DATA: Pain on medial side of right ankle

EXAM:
RIGHT ANKLE - COMPLETE 3+ VIEW

[ankle ap]
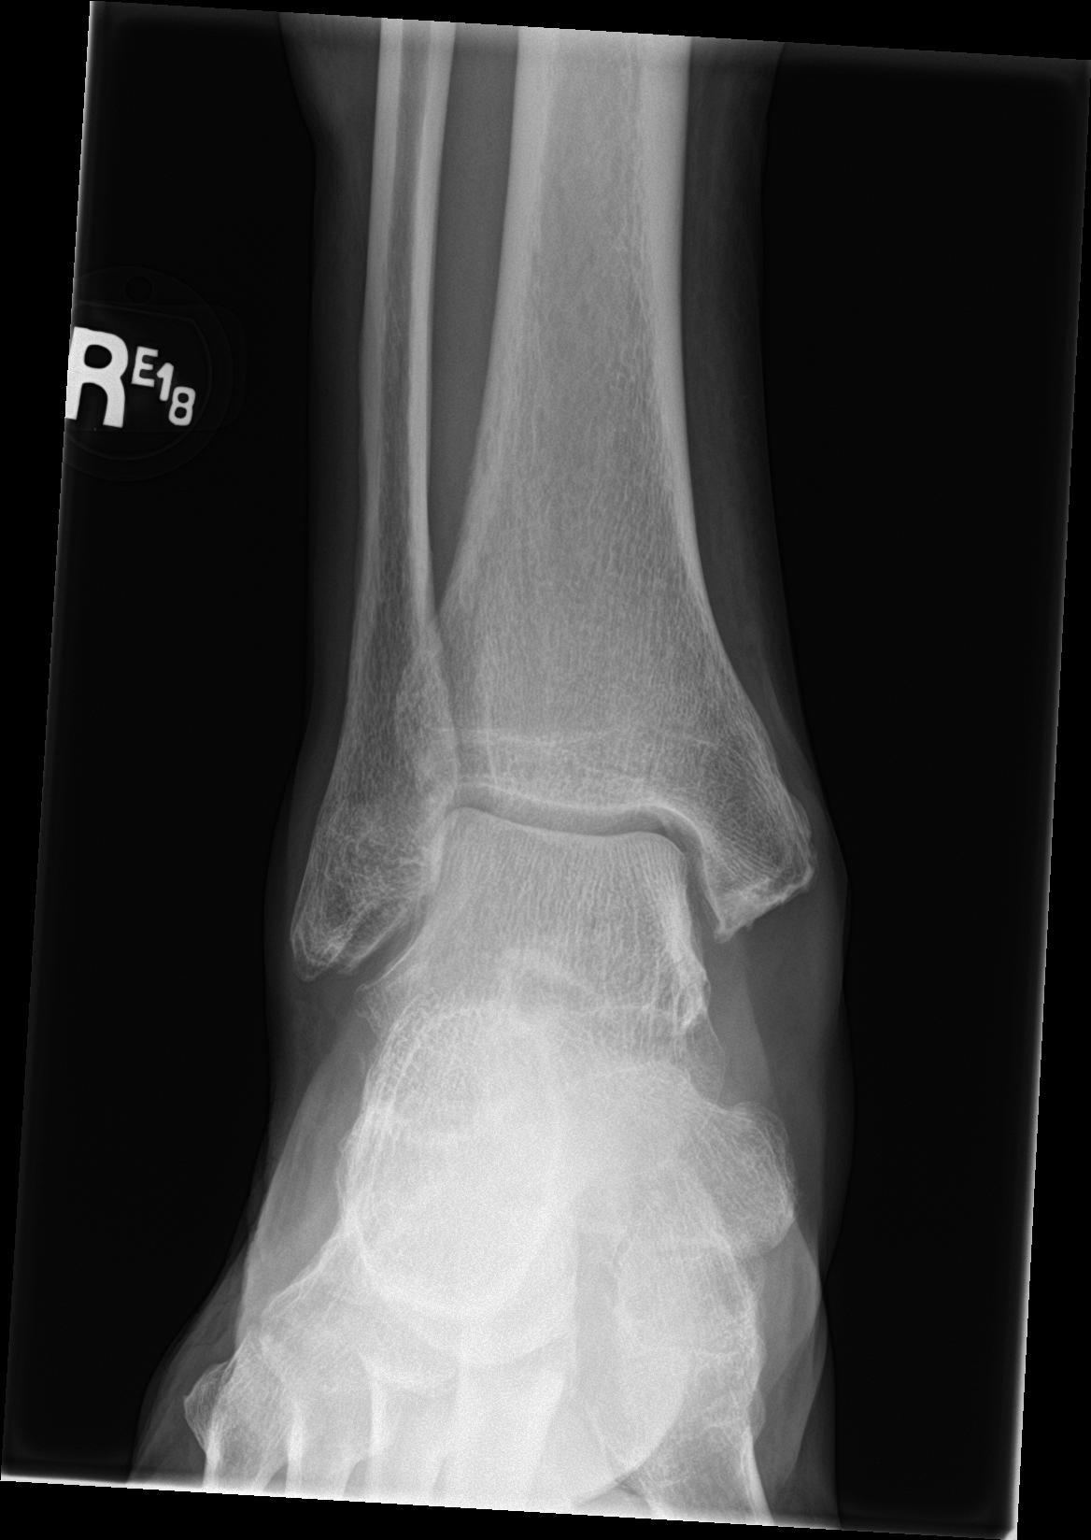

[ankle obl]
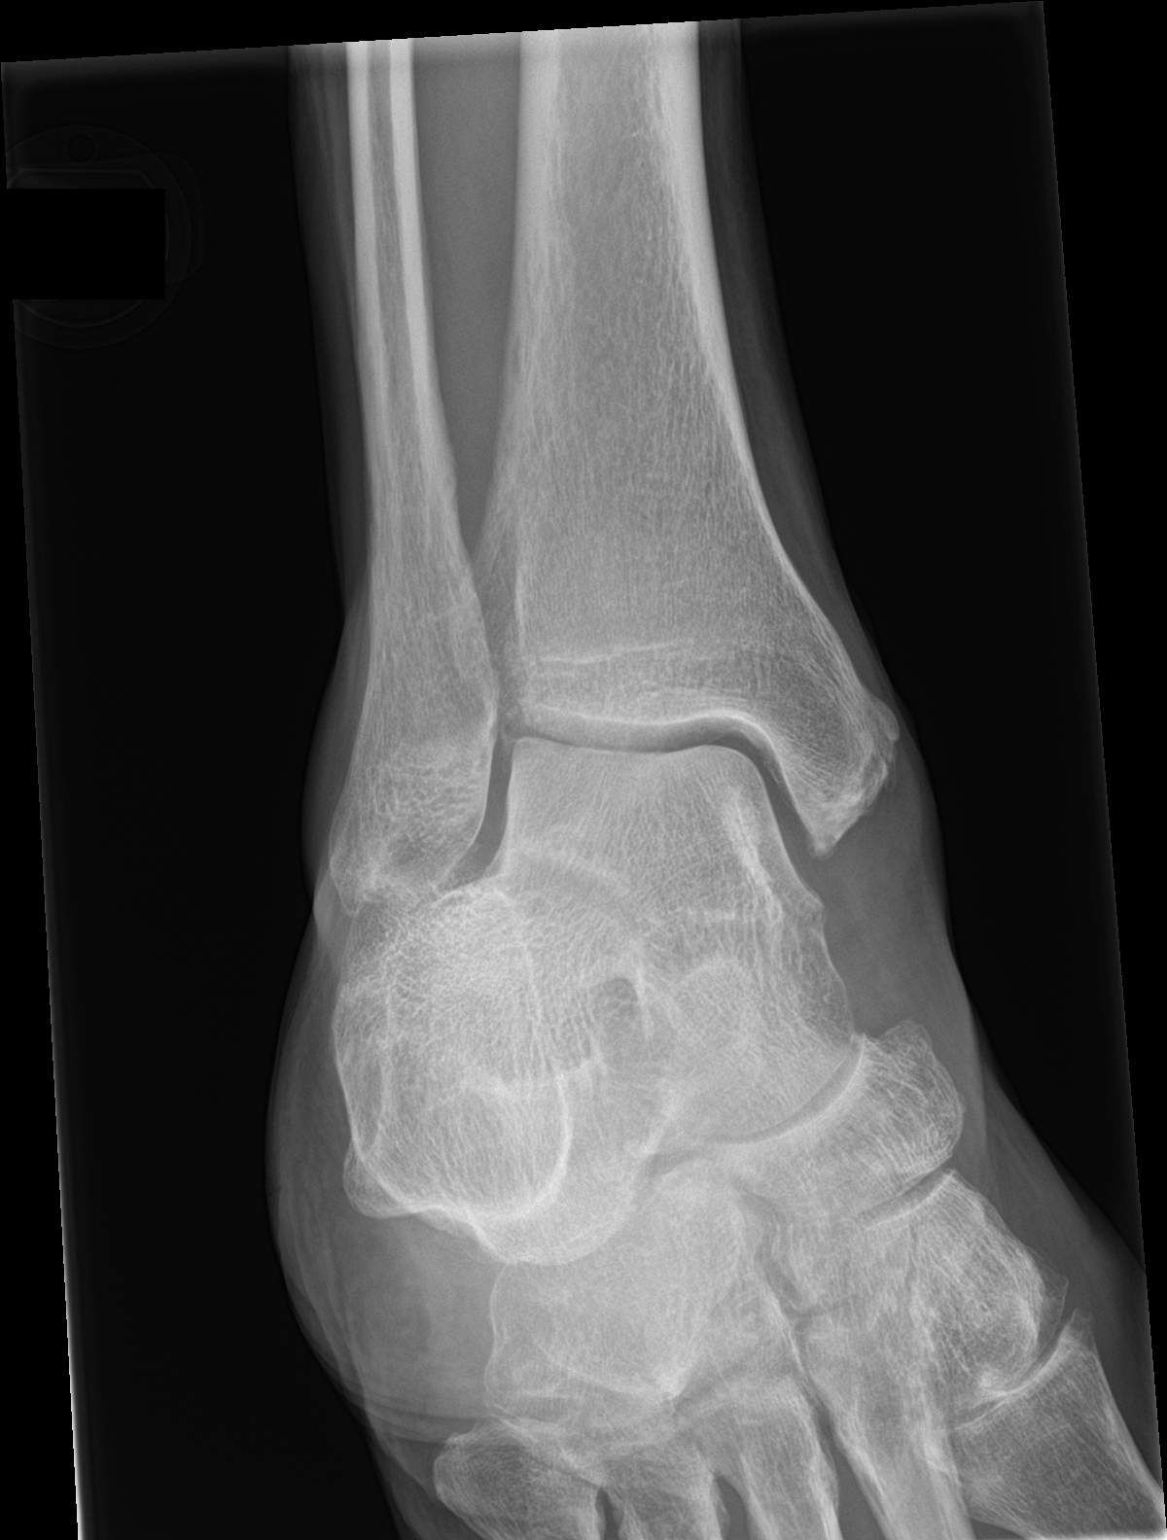

[ankle lat]
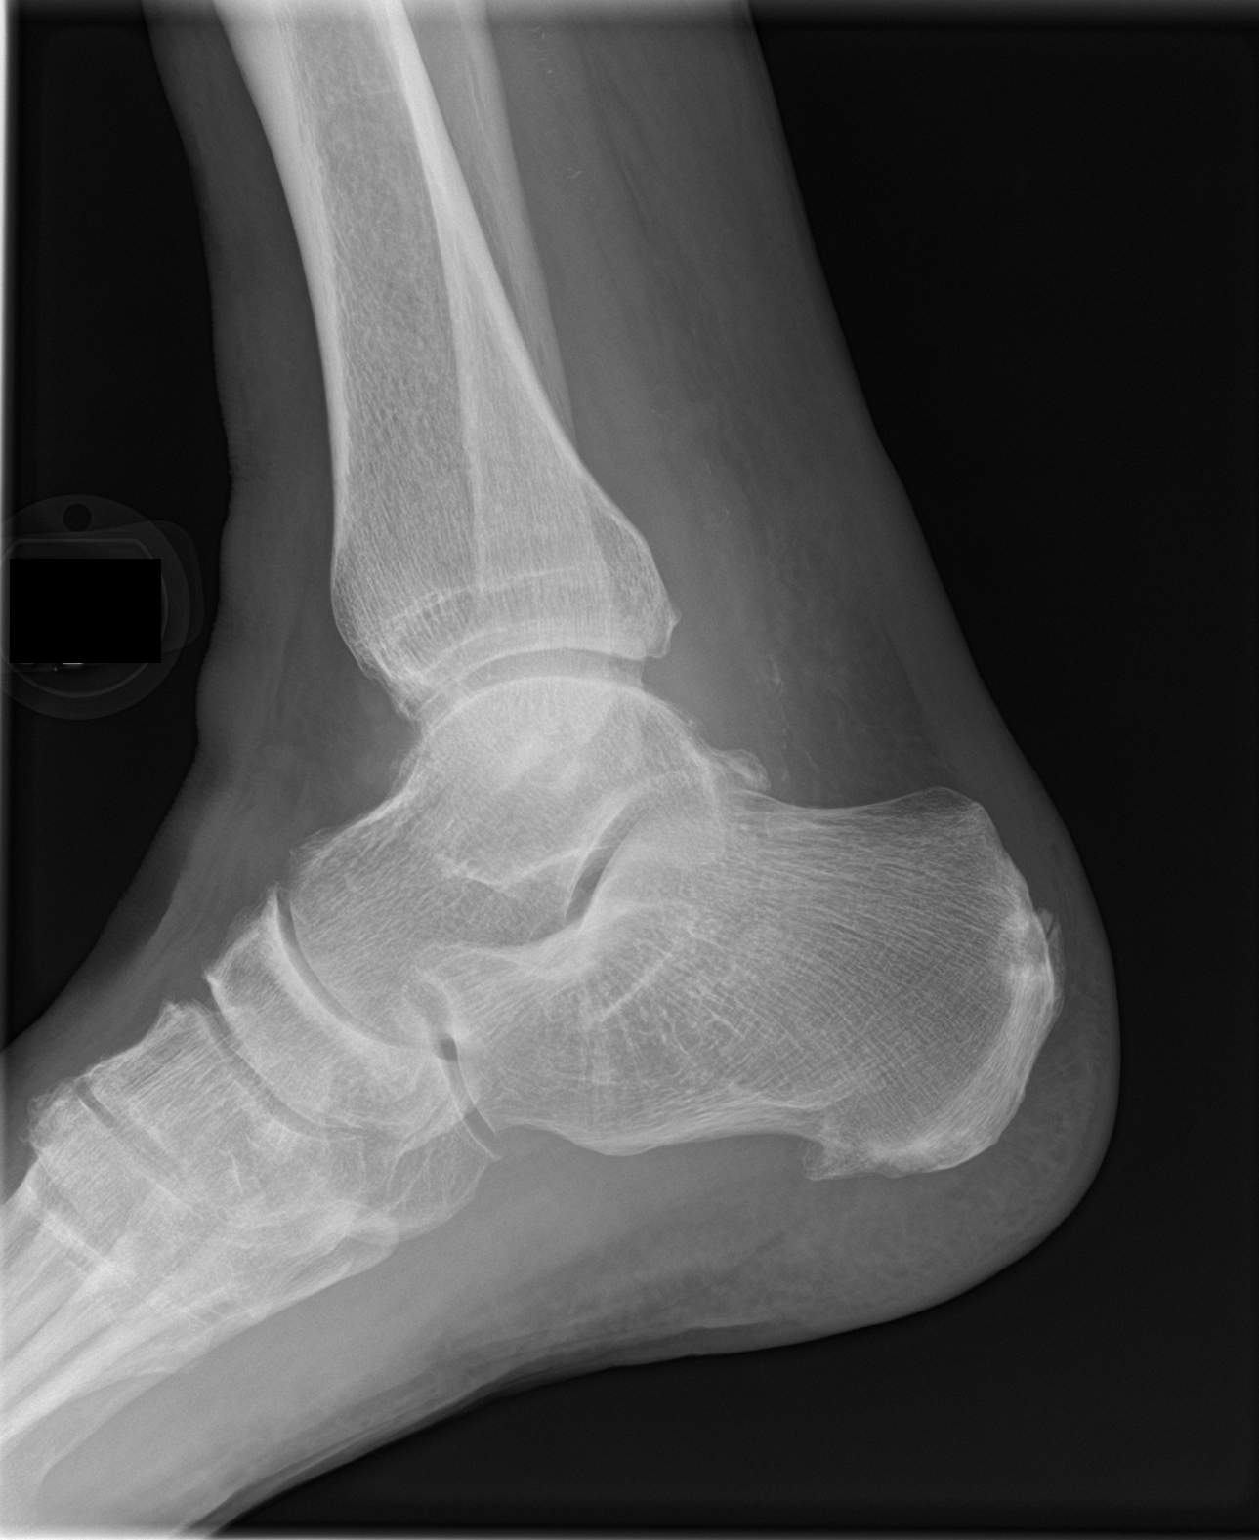

[3 of 3 positions shown; findings below may reference images not displayed]

FINDINGS: There is no acute fracture or dislocation. Alignment is normal. The
ankle mortise is symmetrically intact. The soft tissues are
unremarkable. There is mild Achilles enthesopathy and inferior
calcaneal spurring. There is mild degenerative change about the
midfoot.
IMPRESSION: No acute osseous abnormality identified.

## 2023-10-12 ENCOUNTER — Other Ambulatory Visit (HOSPITAL_COMMUNITY): Payer: Self-pay
